# Patient Record
Sex: Male | Born: 1940 | Race: White | Hispanic: No | Marital: Married | Smoking: Former smoker
Health system: Southern US, Community
[De-identification: ages and names within clinical notes are randomized; demographics above are authoritative.]

## PROBLEM LIST (undated history)

## (undated) ENCOUNTER — Emergency Department (HOSPITAL_BASED_OUTPATIENT_CLINIC_OR_DEPARTMENT_OTHER): Admission: EM | Payer: Medicare Other

## (undated) DIAGNOSIS — M1990 Unspecified osteoarthritis, unspecified site: Secondary | ICD-10-CM

## (undated) DIAGNOSIS — N529 Male erectile dysfunction, unspecified: Secondary | ICD-10-CM

## (undated) DIAGNOSIS — I4891 Unspecified atrial fibrillation: Secondary | ICD-10-CM

## (undated) DIAGNOSIS — G4733 Obstructive sleep apnea (adult) (pediatric): Secondary | ICD-10-CM

## (undated) DIAGNOSIS — K219 Gastro-esophageal reflux disease without esophagitis: Secondary | ICD-10-CM

## (undated) DIAGNOSIS — N323 Diverticulum of bladder: Secondary | ICD-10-CM

## (undated) DIAGNOSIS — H5440 Blindness, one eye, unspecified eye: Secondary | ICD-10-CM

## (undated) DIAGNOSIS — N200 Calculus of kidney: Secondary | ICD-10-CM

## (undated) DIAGNOSIS — E669 Obesity, unspecified: Secondary | ICD-10-CM

## (undated) DIAGNOSIS — I2699 Other pulmonary embolism without acute cor pulmonale: Secondary | ICD-10-CM

## (undated) HISTORY — DX: Calculus of kidney: N20.0

## (undated) HISTORY — DX: Blindness, one eye, unspecified eye: H54.40

## (undated) HISTORY — DX: Unspecified osteoarthritis, unspecified site: M19.90

## (undated) HISTORY — DX: Diverticulum of bladder: N32.3

## (undated) HISTORY — DX: Male erectile dysfunction, unspecified: N52.9

## (undated) HISTORY — DX: Unspecified atrial fibrillation: I48.91

## (undated) HISTORY — DX: Other pulmonary embolism without acute cor pulmonale: I26.99

## (undated) HISTORY — DX: Gastro-esophageal reflux disease without esophagitis: K21.9

## (undated) HISTORY — DX: Obesity, unspecified: E66.9

---
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
## 1961-12-05 HISTORY — PX: APPENDECTOMY: SHX54

## 1982-12-05 HISTORY — PX: UMBILICAL HERNIA REPAIR: SHX196

## 1985-12-05 HISTORY — PX: LITHOTRIPSY: SUR834

## 1998-12-05 DIAGNOSIS — Z86711 Personal history of pulmonary embolism: Secondary | ICD-10-CM | POA: Insufficient documentation

## 2001-12-05 HISTORY — PX: NOSE SURGERY: SHX723

## 2003-12-06 DIAGNOSIS — H5440 Blindness, one eye, unspecified eye: Secondary | ICD-10-CM

## 2003-12-06 HISTORY — DX: Blindness, one eye, unspecified eye: H54.40

## 2013-06-17 DIAGNOSIS — L719 Rosacea, unspecified: Secondary | ICD-10-CM | POA: Insufficient documentation

## 2013-06-17 DIAGNOSIS — G4733 Obstructive sleep apnea (adult) (pediatric): Secondary | ICD-10-CM | POA: Insufficient documentation

## 2013-06-17 DIAGNOSIS — H40051 Ocular hypertension, right eye: Secondary | ICD-10-CM | POA: Insufficient documentation

## 2013-06-17 DIAGNOSIS — H40059 Ocular hypertension, unspecified eye: Secondary | ICD-10-CM | POA: Insufficient documentation

## 2013-06-17 DIAGNOSIS — I4891 Unspecified atrial fibrillation: Secondary | ICD-10-CM | POA: Insufficient documentation

## 2016-12-13 DIAGNOSIS — M461 Sacroiliitis, not elsewhere classified: Secondary | ICD-10-CM | POA: Diagnosis not present

## 2016-12-30 DIAGNOSIS — M461 Sacroiliitis, not elsewhere classified: Secondary | ICD-10-CM | POA: Diagnosis not present

## 2017-01-02 DIAGNOSIS — J449 Chronic obstructive pulmonary disease, unspecified: Secondary | ICD-10-CM | POA: Diagnosis not present

## 2017-01-02 DIAGNOSIS — I4891 Unspecified atrial fibrillation: Secondary | ICD-10-CM | POA: Diagnosis not present

## 2017-01-02 DIAGNOSIS — R030 Elevated blood-pressure reading, without diagnosis of hypertension: Secondary | ICD-10-CM | POA: Diagnosis not present

## 2017-01-04 DIAGNOSIS — H401111 Primary open-angle glaucoma, right eye, mild stage: Secondary | ICD-10-CM | POA: Diagnosis not present

## 2017-01-16 DIAGNOSIS — M47816 Spondylosis without myelopathy or radiculopathy, lumbar region: Secondary | ICD-10-CM | POA: Diagnosis not present

## 2017-01-17 DIAGNOSIS — M545 Low back pain: Secondary | ICD-10-CM | POA: Diagnosis not present

## 2017-01-17 DIAGNOSIS — Z79899 Other long term (current) drug therapy: Secondary | ICD-10-CM | POA: Diagnosis not present

## 2017-01-17 DIAGNOSIS — M6281 Muscle weakness (generalized): Secondary | ICD-10-CM | POA: Diagnosis not present

## 2017-01-17 DIAGNOSIS — Z9981 Dependence on supplemental oxygen: Secondary | ICD-10-CM | POA: Diagnosis not present

## 2017-01-17 DIAGNOSIS — M549 Dorsalgia, unspecified: Secondary | ICD-10-CM | POA: Diagnosis not present

## 2017-01-17 DIAGNOSIS — J439 Emphysema, unspecified: Secondary | ICD-10-CM | POA: Diagnosis not present

## 2017-01-18 DIAGNOSIS — Z1212 Encounter for screening for malignant neoplasm of rectum: Secondary | ICD-10-CM | POA: Diagnosis not present

## 2017-01-18 DIAGNOSIS — Z1211 Encounter for screening for malignant neoplasm of colon: Secondary | ICD-10-CM | POA: Diagnosis not present

## 2017-01-19 DIAGNOSIS — M549 Dorsalgia, unspecified: Secondary | ICD-10-CM | POA: Diagnosis not present

## 2017-01-19 DIAGNOSIS — M545 Low back pain: Secondary | ICD-10-CM | POA: Diagnosis not present

## 2017-01-19 DIAGNOSIS — Z79899 Other long term (current) drug therapy: Secondary | ICD-10-CM | POA: Diagnosis not present

## 2017-01-19 DIAGNOSIS — J439 Emphysema, unspecified: Secondary | ICD-10-CM | POA: Diagnosis not present

## 2017-01-19 DIAGNOSIS — M6281 Muscle weakness (generalized): Secondary | ICD-10-CM | POA: Diagnosis not present

## 2017-01-19 DIAGNOSIS — Z9981 Dependence on supplemental oxygen: Secondary | ICD-10-CM | POA: Diagnosis not present

## 2017-01-23 DIAGNOSIS — M545 Low back pain: Secondary | ICD-10-CM | POA: Diagnosis not present

## 2017-01-23 DIAGNOSIS — M549 Dorsalgia, unspecified: Secondary | ICD-10-CM | POA: Diagnosis not present

## 2017-01-23 DIAGNOSIS — J439 Emphysema, unspecified: Secondary | ICD-10-CM | POA: Diagnosis not present

## 2017-01-23 DIAGNOSIS — Z79899 Other long term (current) drug therapy: Secondary | ICD-10-CM | POA: Diagnosis not present

## 2017-01-23 DIAGNOSIS — Z9981 Dependence on supplemental oxygen: Secondary | ICD-10-CM | POA: Diagnosis not present

## 2017-01-23 DIAGNOSIS — M6281 Muscle weakness (generalized): Secondary | ICD-10-CM | POA: Diagnosis not present

## 2017-01-26 DIAGNOSIS — J439 Emphysema, unspecified: Secondary | ICD-10-CM | POA: Diagnosis not present

## 2017-01-26 DIAGNOSIS — M6281 Muscle weakness (generalized): Secondary | ICD-10-CM | POA: Diagnosis not present

## 2017-01-26 DIAGNOSIS — M545 Low back pain: Secondary | ICD-10-CM | POA: Diagnosis not present

## 2017-01-26 DIAGNOSIS — Z79899 Other long term (current) drug therapy: Secondary | ICD-10-CM | POA: Diagnosis not present

## 2017-01-26 DIAGNOSIS — Z9981 Dependence on supplemental oxygen: Secondary | ICD-10-CM | POA: Diagnosis not present

## 2017-01-26 DIAGNOSIS — M549 Dorsalgia, unspecified: Secondary | ICD-10-CM | POA: Diagnosis not present

## 2017-01-30 DIAGNOSIS — J439 Emphysema, unspecified: Secondary | ICD-10-CM | POA: Diagnosis not present

## 2017-01-30 DIAGNOSIS — M545 Low back pain: Secondary | ICD-10-CM | POA: Diagnosis not present

## 2017-01-30 DIAGNOSIS — Z79899 Other long term (current) drug therapy: Secondary | ICD-10-CM | POA: Diagnosis not present

## 2017-01-30 DIAGNOSIS — M549 Dorsalgia, unspecified: Secondary | ICD-10-CM | POA: Diagnosis not present

## 2017-01-30 DIAGNOSIS — M6281 Muscle weakness (generalized): Secondary | ICD-10-CM | POA: Diagnosis not present

## 2017-01-30 DIAGNOSIS — Z9981 Dependence on supplemental oxygen: Secondary | ICD-10-CM | POA: Diagnosis not present

## 2017-02-01 DIAGNOSIS — M47816 Spondylosis without myelopathy or radiculopathy, lumbar region: Secondary | ICD-10-CM | POA: Diagnosis not present

## 2017-02-02 DIAGNOSIS — M549 Dorsalgia, unspecified: Secondary | ICD-10-CM | POA: Diagnosis not present

## 2017-02-02 DIAGNOSIS — J439 Emphysema, unspecified: Secondary | ICD-10-CM | POA: Diagnosis not present

## 2017-02-02 DIAGNOSIS — M545 Low back pain: Secondary | ICD-10-CM | POA: Diagnosis not present

## 2017-02-02 DIAGNOSIS — H04123 Dry eye syndrome of bilateral lacrimal glands: Secondary | ICD-10-CM | POA: Diagnosis not present

## 2017-02-02 DIAGNOSIS — Z9981 Dependence on supplemental oxygen: Secondary | ICD-10-CM | POA: Diagnosis not present

## 2017-02-02 DIAGNOSIS — Z79899 Other long term (current) drug therapy: Secondary | ICD-10-CM | POA: Diagnosis not present

## 2017-02-02 DIAGNOSIS — M6281 Muscle weakness (generalized): Secondary | ICD-10-CM | POA: Diagnosis not present

## 2017-02-02 DIAGNOSIS — H00023 Hordeolum internum right eye, unspecified eyelid: Secondary | ICD-10-CM | POA: Diagnosis not present

## 2017-02-06 DIAGNOSIS — M545 Low back pain: Secondary | ICD-10-CM | POA: Diagnosis not present

## 2017-02-06 DIAGNOSIS — M6281 Muscle weakness (generalized): Secondary | ICD-10-CM | POA: Diagnosis not present

## 2017-02-06 DIAGNOSIS — G629 Polyneuropathy, unspecified: Secondary | ICD-10-CM | POA: Diagnosis not present

## 2017-02-27 DIAGNOSIS — M47816 Spondylosis without myelopathy or radiculopathy, lumbar region: Secondary | ICD-10-CM | POA: Diagnosis not present

## 2017-03-14 DIAGNOSIS — M47816 Spondylosis without myelopathy or radiculopathy, lumbar region: Secondary | ICD-10-CM | POA: Diagnosis not present

## 2017-03-27 DIAGNOSIS — I480 Paroxysmal atrial fibrillation: Secondary | ICD-10-CM | POA: Diagnosis not present

## 2017-03-27 DIAGNOSIS — I10 Essential (primary) hypertension: Secondary | ICD-10-CM | POA: Diagnosis not present

## 2017-03-27 DIAGNOSIS — J449 Chronic obstructive pulmonary disease, unspecified: Secondary | ICD-10-CM | POA: Diagnosis not present

## 2017-04-06 DIAGNOSIS — M47816 Spondylosis without myelopathy or radiculopathy, lumbar region: Secondary | ICD-10-CM | POA: Diagnosis not present

## 2017-04-13 DIAGNOSIS — I480 Paroxysmal atrial fibrillation: Secondary | ICD-10-CM | POA: Diagnosis not present

## 2017-04-13 DIAGNOSIS — I519 Heart disease, unspecified: Secondary | ICD-10-CM | POA: Diagnosis not present

## 2017-04-13 DIAGNOSIS — G4733 Obstructive sleep apnea (adult) (pediatric): Secondary | ICD-10-CM | POA: Diagnosis not present

## 2017-04-13 DIAGNOSIS — I10 Essential (primary) hypertension: Secondary | ICD-10-CM | POA: Diagnosis not present

## 2017-04-18 DIAGNOSIS — Z961 Presence of intraocular lens: Secondary | ICD-10-CM | POA: Diagnosis not present

## 2017-04-18 DIAGNOSIS — H401111 Primary open-angle glaucoma, right eye, mild stage: Secondary | ICD-10-CM | POA: Diagnosis not present

## 2017-04-18 DIAGNOSIS — H05402 Unspecified enophthalmos, left eye: Secondary | ICD-10-CM | POA: Diagnosis not present

## 2017-04-18 DIAGNOSIS — H00021 Hordeolum internum right upper eyelid: Secondary | ICD-10-CM | POA: Diagnosis not present

## 2017-04-18 DIAGNOSIS — H00022 Hordeolum internum right lower eyelid: Secondary | ICD-10-CM | POA: Diagnosis not present

## 2017-05-02 DIAGNOSIS — H40051 Ocular hypertension, right eye: Secondary | ICD-10-CM | POA: Diagnosis not present

## 2017-05-02 DIAGNOSIS — H018 Other specified inflammations of eyelid: Secondary | ICD-10-CM | POA: Diagnosis not present

## 2017-05-02 DIAGNOSIS — H10013 Acute follicular conjunctivitis, bilateral: Secondary | ICD-10-CM | POA: Diagnosis not present

## 2017-05-02 DIAGNOSIS — H04123 Dry eye syndrome of bilateral lacrimal glands: Secondary | ICD-10-CM | POA: Diagnosis not present

## 2017-05-08 DIAGNOSIS — M48061 Spinal stenosis, lumbar region without neurogenic claudication: Secondary | ICD-10-CM | POA: Diagnosis not present

## 2017-05-08 DIAGNOSIS — J410 Simple chronic bronchitis: Secondary | ICD-10-CM | POA: Diagnosis not present

## 2017-05-08 DIAGNOSIS — M48062 Spinal stenosis, lumbar region with neurogenic claudication: Secondary | ICD-10-CM | POA: Diagnosis not present

## 2017-05-08 DIAGNOSIS — G4733 Obstructive sleep apnea (adult) (pediatric): Secondary | ICD-10-CM | POA: Diagnosis not present

## 2017-05-08 DIAGNOSIS — J9610 Chronic respiratory failure, unspecified whether with hypoxia or hypercapnia: Secondary | ICD-10-CM | POA: Diagnosis not present

## 2017-05-16 DIAGNOSIS — H40051 Ocular hypertension, right eye: Secondary | ICD-10-CM | POA: Diagnosis not present

## 2017-05-16 DIAGNOSIS — H10431 Chronic follicular conjunctivitis, right eye: Secondary | ICD-10-CM | POA: Diagnosis not present

## 2017-05-26 DIAGNOSIS — M48062 Spinal stenosis, lumbar region with neurogenic claudication: Secondary | ICD-10-CM | POA: Diagnosis not present

## 2017-06-16 DIAGNOSIS — H10431 Chronic follicular conjunctivitis, right eye: Secondary | ICD-10-CM | POA: Diagnosis not present

## 2017-06-16 DIAGNOSIS — H40051 Ocular hypertension, right eye: Secondary | ICD-10-CM | POA: Diagnosis not present

## 2017-07-10 DIAGNOSIS — H10431 Chronic follicular conjunctivitis, right eye: Secondary | ICD-10-CM | POA: Diagnosis not present

## 2017-07-10 DIAGNOSIS — H40051 Ocular hypertension, right eye: Secondary | ICD-10-CM | POA: Diagnosis not present

## 2017-07-19 DIAGNOSIS — H40051 Ocular hypertension, right eye: Secondary | ICD-10-CM | POA: Diagnosis not present

## 2017-08-21 DIAGNOSIS — H40051 Ocular hypertension, right eye: Secondary | ICD-10-CM | POA: Diagnosis not present

## 2017-08-31 DIAGNOSIS — Z87891 Personal history of nicotine dependence: Secondary | ICD-10-CM | POA: Diagnosis not present

## 2017-08-31 DIAGNOSIS — Z7901 Long term (current) use of anticoagulants: Secondary | ICD-10-CM | POA: Diagnosis not present

## 2017-08-31 DIAGNOSIS — Z7951 Long term (current) use of inhaled steroids: Secondary | ICD-10-CM | POA: Diagnosis not present

## 2017-08-31 DIAGNOSIS — M47816 Spondylosis without myelopathy or radiculopathy, lumbar region: Secondary | ICD-10-CM | POA: Diagnosis not present

## 2017-08-31 DIAGNOSIS — G894 Chronic pain syndrome: Secondary | ICD-10-CM | POA: Diagnosis not present

## 2017-08-31 DIAGNOSIS — I10 Essential (primary) hypertension: Secondary | ICD-10-CM | POA: Diagnosis not present

## 2017-08-31 DIAGNOSIS — I4891 Unspecified atrial fibrillation: Secondary | ICD-10-CM | POA: Diagnosis not present

## 2017-08-31 DIAGNOSIS — Z86711 Personal history of pulmonary embolism: Secondary | ICD-10-CM | POA: Diagnosis not present

## 2017-08-31 DIAGNOSIS — M545 Low back pain: Secondary | ICD-10-CM | POA: Diagnosis not present

## 2017-08-31 DIAGNOSIS — Z79899 Other long term (current) drug therapy: Secondary | ICD-10-CM | POA: Diagnosis not present

## 2017-09-13 DIAGNOSIS — J0100 Acute maxillary sinusitis, unspecified: Secondary | ICD-10-CM | POA: Diagnosis not present

## 2017-09-13 DIAGNOSIS — L719 Rosacea, unspecified: Secondary | ICD-10-CM | POA: Diagnosis not present

## 2017-09-13 DIAGNOSIS — G4730 Sleep apnea, unspecified: Secondary | ICD-10-CM | POA: Diagnosis not present

## 2017-09-13 DIAGNOSIS — J439 Emphysema, unspecified: Secondary | ICD-10-CM | POA: Diagnosis not present

## 2017-09-13 DIAGNOSIS — I10 Essential (primary) hypertension: Secondary | ICD-10-CM | POA: Diagnosis not present

## 2017-09-13 DIAGNOSIS — E669 Obesity, unspecified: Secondary | ICD-10-CM | POA: Diagnosis not present

## 2017-09-13 DIAGNOSIS — R5383 Other fatigue: Secondary | ICD-10-CM | POA: Diagnosis not present

## 2017-09-13 DIAGNOSIS — I4891 Unspecified atrial fibrillation: Secondary | ICD-10-CM | POA: Diagnosis not present

## 2017-09-13 DIAGNOSIS — G894 Chronic pain syndrome: Secondary | ICD-10-CM | POA: Diagnosis not present

## 2017-09-13 DIAGNOSIS — Z7689 Persons encountering health services in other specified circumstances: Secondary | ICD-10-CM | POA: Diagnosis not present

## 2017-09-13 DIAGNOSIS — L989 Disorder of the skin and subcutaneous tissue, unspecified: Secondary | ICD-10-CM | POA: Diagnosis not present

## 2017-09-13 DIAGNOSIS — Z125 Encounter for screening for malignant neoplasm of prostate: Secondary | ICD-10-CM | POA: Diagnosis not present

## 2017-09-21 DIAGNOSIS — G894 Chronic pain syndrome: Secondary | ICD-10-CM | POA: Diagnosis not present

## 2017-09-21 DIAGNOSIS — M47816 Spondylosis without myelopathy or radiculopathy, lumbar region: Secondary | ICD-10-CM | POA: Diagnosis not present

## 2017-09-22 DIAGNOSIS — I10 Essential (primary) hypertension: Secondary | ICD-10-CM | POA: Diagnosis not present

## 2017-09-22 DIAGNOSIS — R5383 Other fatigue: Secondary | ICD-10-CM | POA: Diagnosis not present

## 2017-09-22 DIAGNOSIS — Z125 Encounter for screening for malignant neoplasm of prostate: Secondary | ICD-10-CM | POA: Diagnosis not present

## 2017-09-22 DIAGNOSIS — E669 Obesity, unspecified: Secondary | ICD-10-CM | POA: Diagnosis not present

## 2017-09-28 DIAGNOSIS — Z23 Encounter for immunization: Secondary | ICD-10-CM | POA: Diagnosis not present

## 2017-10-11 DIAGNOSIS — H01009 Unspecified blepharitis unspecified eye, unspecified eyelid: Secondary | ICD-10-CM | POA: Diagnosis not present

## 2017-10-11 DIAGNOSIS — H02125 Mechanical ectropion of left lower eyelid: Secondary | ICD-10-CM | POA: Diagnosis not present

## 2017-10-11 DIAGNOSIS — H40051 Ocular hypertension, right eye: Secondary | ICD-10-CM | POA: Diagnosis not present

## 2017-10-11 DIAGNOSIS — Z961 Presence of intraocular lens: Secondary | ICD-10-CM | POA: Diagnosis not present

## 2017-10-24 DIAGNOSIS — G894 Chronic pain syndrome: Secondary | ICD-10-CM | POA: Diagnosis not present

## 2017-10-24 DIAGNOSIS — M47816 Spondylosis without myelopathy or radiculopathy, lumbar region: Secondary | ICD-10-CM | POA: Diagnosis not present

## 2017-11-20 ENCOUNTER — Encounter: Payer: Self-pay | Admitting: Pulmonary Disease

## 2017-11-20 DIAGNOSIS — J42 Unspecified chronic bronchitis: Secondary | ICD-10-CM | POA: Diagnosis not present

## 2017-11-20 DIAGNOSIS — G4733 Obstructive sleep apnea (adult) (pediatric): Secondary | ICD-10-CM | POA: Diagnosis not present

## 2017-11-20 DIAGNOSIS — J61 Pneumoconiosis due to asbestos and other mineral fibers: Secondary | ICD-10-CM | POA: Diagnosis not present

## 2017-11-20 DIAGNOSIS — J920 Pleural plaque with presence of asbestos: Secondary | ICD-10-CM | POA: Diagnosis not present

## 2017-12-06 DIAGNOSIS — L400 Psoriasis vulgaris: Secondary | ICD-10-CM | POA: Diagnosis not present

## 2017-12-06 DIAGNOSIS — Z79899 Other long term (current) drug therapy: Secondary | ICD-10-CM | POA: Diagnosis not present

## 2017-12-06 DIAGNOSIS — Z77090 Contact with and (suspected) exposure to asbestos: Secondary | ICD-10-CM | POA: Diagnosis not present

## 2017-12-06 DIAGNOSIS — L814 Other melanin hyperpigmentation: Secondary | ICD-10-CM | POA: Diagnosis not present

## 2017-12-06 DIAGNOSIS — Z572 Occupational exposure to dust: Secondary | ICD-10-CM | POA: Diagnosis not present

## 2017-12-06 DIAGNOSIS — Z86711 Personal history of pulmonary embolism: Secondary | ICD-10-CM | POA: Diagnosis not present

## 2017-12-06 DIAGNOSIS — J61 Pneumoconiosis due to asbestos and other mineral fibers: Secondary | ICD-10-CM | POA: Diagnosis not present

## 2017-12-06 DIAGNOSIS — Z5181 Encounter for therapeutic drug level monitoring: Secondary | ICD-10-CM | POA: Diagnosis not present

## 2017-12-06 DIAGNOSIS — J920 Pleural plaque with presence of asbestos: Secondary | ICD-10-CM | POA: Diagnosis not present

## 2017-12-06 DIAGNOSIS — E669 Obesity, unspecified: Secondary | ICD-10-CM | POA: Diagnosis not present

## 2017-12-06 DIAGNOSIS — R0600 Dyspnea, unspecified: Secondary | ICD-10-CM | POA: Diagnosis not present

## 2017-12-06 DIAGNOSIS — J42 Unspecified chronic bronchitis: Secondary | ICD-10-CM | POA: Diagnosis not present

## 2017-12-06 DIAGNOSIS — Z7901 Long term (current) use of anticoagulants: Secondary | ICD-10-CM | POA: Diagnosis not present

## 2017-12-06 DIAGNOSIS — Z87891 Personal history of nicotine dependence: Secondary | ICD-10-CM | POA: Diagnosis not present

## 2017-12-06 DIAGNOSIS — G4733 Obstructive sleep apnea (adult) (pediatric): Secondary | ICD-10-CM | POA: Diagnosis not present

## 2017-12-13 DIAGNOSIS — G4700 Insomnia, unspecified: Secondary | ICD-10-CM | POA: Diagnosis not present

## 2017-12-13 DIAGNOSIS — N400 Enlarged prostate without lower urinary tract symptoms: Secondary | ICD-10-CM | POA: Diagnosis not present

## 2017-12-13 DIAGNOSIS — I10 Essential (primary) hypertension: Secondary | ICD-10-CM | POA: Diagnosis not present

## 2017-12-13 DIAGNOSIS — E669 Obesity, unspecified: Secondary | ICD-10-CM | POA: Diagnosis not present

## 2017-12-13 DIAGNOSIS — I4891 Unspecified atrial fibrillation: Secondary | ICD-10-CM | POA: Diagnosis not present

## 2017-12-13 DIAGNOSIS — R7303 Prediabetes: Secondary | ICD-10-CM | POA: Diagnosis not present

## 2017-12-22 DIAGNOSIS — I10 Essential (primary) hypertension: Secondary | ICD-10-CM | POA: Diagnosis not present

## 2017-12-22 DIAGNOSIS — I4891 Unspecified atrial fibrillation: Secondary | ICD-10-CM | POA: Diagnosis not present

## 2018-01-08 DIAGNOSIS — G894 Chronic pain syndrome: Secondary | ICD-10-CM | POA: Diagnosis not present

## 2018-01-08 DIAGNOSIS — M47816 Spondylosis without myelopathy or radiculopathy, lumbar region: Secondary | ICD-10-CM | POA: Diagnosis not present

## 2018-01-08 DIAGNOSIS — Z79899 Other long term (current) drug therapy: Secondary | ICD-10-CM | POA: Diagnosis not present

## 2018-01-23 ENCOUNTER — Encounter: Payer: Self-pay | Admitting: Pulmonary Disease

## 2018-01-23 DIAGNOSIS — J920 Pleural plaque with presence of asbestos: Secondary | ICD-10-CM | POA: Diagnosis not present

## 2018-01-23 DIAGNOSIS — J42 Unspecified chronic bronchitis: Secondary | ICD-10-CM | POA: Diagnosis not present

## 2018-01-23 DIAGNOSIS — J61 Pneumoconiosis due to asbestos and other mineral fibers: Secondary | ICD-10-CM | POA: Diagnosis not present

## 2018-01-23 DIAGNOSIS — G4733 Obstructive sleep apnea (adult) (pediatric): Secondary | ICD-10-CM | POA: Diagnosis not present

## 2018-01-30 DIAGNOSIS — J920 Pleural plaque with presence of asbestos: Secondary | ICD-10-CM | POA: Diagnosis not present

## 2018-01-30 DIAGNOSIS — J61 Pneumoconiosis due to asbestos and other mineral fibers: Secondary | ICD-10-CM | POA: Diagnosis not present

## 2018-01-30 DIAGNOSIS — G4733 Obstructive sleep apnea (adult) (pediatric): Secondary | ICD-10-CM | POA: Diagnosis not present

## 2018-01-30 DIAGNOSIS — J42 Unspecified chronic bronchitis: Secondary | ICD-10-CM | POA: Diagnosis not present

## 2018-02-20 DIAGNOSIS — H40051 Ocular hypertension, right eye: Secondary | ICD-10-CM | POA: Diagnosis not present

## 2018-02-20 DIAGNOSIS — H04123 Dry eye syndrome of bilateral lacrimal glands: Secondary | ICD-10-CM | POA: Diagnosis not present

## 2018-02-22 DIAGNOSIS — M47816 Spondylosis without myelopathy or radiculopathy, lumbar region: Secondary | ICD-10-CM | POA: Diagnosis not present

## 2018-02-22 DIAGNOSIS — G894 Chronic pain syndrome: Secondary | ICD-10-CM | POA: Diagnosis not present

## 2018-02-28 DIAGNOSIS — I4891 Unspecified atrial fibrillation: Secondary | ICD-10-CM | POA: Diagnosis not present

## 2018-02-28 DIAGNOSIS — E669 Obesity, unspecified: Secondary | ICD-10-CM | POA: Diagnosis not present

## 2018-02-28 DIAGNOSIS — G4700 Insomnia, unspecified: Secondary | ICD-10-CM | POA: Diagnosis not present

## 2018-02-28 DIAGNOSIS — R7303 Prediabetes: Secondary | ICD-10-CM | POA: Diagnosis not present

## 2018-02-28 DIAGNOSIS — I10 Essential (primary) hypertension: Secondary | ICD-10-CM | POA: Diagnosis not present

## 2018-04-10 DIAGNOSIS — J0100 Acute maxillary sinusitis, unspecified: Secondary | ICD-10-CM | POA: Diagnosis not present

## 2018-04-27 DIAGNOSIS — H5201 Hypermetropia, right eye: Secondary | ICD-10-CM | POA: Diagnosis not present

## 2018-04-27 DIAGNOSIS — H52221 Regular astigmatism, right eye: Secondary | ICD-10-CM | POA: Diagnosis not present

## 2018-04-27 DIAGNOSIS — H40051 Ocular hypertension, right eye: Secondary | ICD-10-CM | POA: Diagnosis not present

## 2018-04-27 DIAGNOSIS — H524 Presbyopia: Secondary | ICD-10-CM | POA: Diagnosis not present

## 2018-05-15 ENCOUNTER — Encounter: Payer: Self-pay | Admitting: *Deleted

## 2018-05-17 ENCOUNTER — Ambulatory Visit: Payer: Self-pay | Admitting: Physician Assistant

## 2018-05-17 ENCOUNTER — Encounter: Payer: Self-pay | Admitting: Physician Assistant

## 2018-05-18 ENCOUNTER — Telehealth: Payer: Self-pay | Admitting: General Practice

## 2018-05-18 NOTE — Telephone Encounter (Signed)
Error

## 2018-05-18 NOTE — Telephone Encounter (Signed)
Patient is scheduled   

## 2018-05-18 NOTE — Telephone Encounter (Signed)
The patient needs to be scheduled for a new patient appointment on Tuesday June 18 at 11:00am with Jarold MottoSamantha Worley. [facility] The patient has been advised that Samantha cannot prescribe chronic pain medications. The patient verbalized understanding stating that this is okay because he goes to a pain clinic.

## 2018-05-22 ENCOUNTER — Ambulatory Visit (INDEPENDENT_AMBULATORY_CARE_PROVIDER_SITE_OTHER): Payer: Medicare Other | Admitting: Physician Assistant

## 2018-05-22 ENCOUNTER — Encounter: Payer: Self-pay | Admitting: Physician Assistant

## 2018-05-22 VITALS — BP 120/74 | HR 86 | Temp 97.9°F | Ht 69.5 in | Wt 269.2 lb

## 2018-05-22 DIAGNOSIS — Z87891 Personal history of nicotine dependence: Secondary | ICD-10-CM | POA: Insufficient documentation

## 2018-05-22 DIAGNOSIS — Z7689 Persons encountering health services in other specified circumstances: Secondary | ICD-10-CM

## 2018-05-22 DIAGNOSIS — I2699 Other pulmonary embolism without acute cor pulmonale: Secondary | ICD-10-CM | POA: Diagnosis not present

## 2018-05-22 DIAGNOSIS — G4700 Insomnia, unspecified: Secondary | ICD-10-CM | POA: Diagnosis not present

## 2018-05-22 DIAGNOSIS — K219 Gastro-esophageal reflux disease without esophagitis: Secondary | ICD-10-CM | POA: Insufficient documentation

## 2018-05-22 DIAGNOSIS — G4733 Obstructive sleep apnea (adult) (pediatric): Secondary | ICD-10-CM

## 2018-05-22 DIAGNOSIS — N200 Calculus of kidney: Secondary | ICD-10-CM | POA: Insufficient documentation

## 2018-05-22 DIAGNOSIS — J849 Interstitial pulmonary disease, unspecified: Secondary | ICD-10-CM | POA: Insufficient documentation

## 2018-05-22 DIAGNOSIS — H5440 Blindness, one eye, unspecified eye: Secondary | ICD-10-CM

## 2018-05-22 DIAGNOSIS — I10 Essential (primary) hypertension: Secondary | ICD-10-CM

## 2018-05-22 DIAGNOSIS — H409 Unspecified glaucoma: Secondary | ICD-10-CM | POA: Diagnosis not present

## 2018-05-22 DIAGNOSIS — M1990 Unspecified osteoarthritis, unspecified site: Secondary | ICD-10-CM

## 2018-05-22 DIAGNOSIS — I4891 Unspecified atrial fibrillation: Secondary | ICD-10-CM

## 2018-05-22 DIAGNOSIS — N529 Male erectile dysfunction, unspecified: Secondary | ICD-10-CM | POA: Diagnosis not present

## 2018-05-22 DIAGNOSIS — J439 Emphysema, unspecified: Secondary | ICD-10-CM

## 2018-05-22 DIAGNOSIS — E669 Obesity, unspecified: Secondary | ICD-10-CM | POA: Insufficient documentation

## 2018-05-22 DIAGNOSIS — G894 Chronic pain syndrome: Secondary | ICD-10-CM

## 2018-05-22 LAB — COMPREHENSIVE METABOLIC PANEL
ALBUMIN: 3.9 g/dL (ref 3.5–5.2)
ALK PHOS: 75 U/L (ref 39–117)
ALT: 19 U/L (ref 0–53)
AST: 24 U/L (ref 0–37)
BUN: 16 mg/dL (ref 6–23)
CHLORIDE: 100 meq/L (ref 96–112)
CO2: 34 mEq/L — ABNORMAL HIGH (ref 19–32)
CREATININE: 0.85 mg/dL (ref 0.40–1.50)
Calcium: 9.8 mg/dL (ref 8.4–10.5)
GFR: 92.83 mL/min (ref >60.00)
GLUCOSE: 98 mg/dL (ref 70–99)
Potassium: 4.6 mEq/L (ref 3.5–5.1)
SODIUM: 140 meq/L (ref 135–145)
TOTAL PROTEIN: 6.8 g/dL (ref 6.0–8.3)
Total Bilirubin: 0.6 mg/dL (ref 0.2–1.2)

## 2018-05-22 MED ORDER — ATORVASTATIN CALCIUM 40 MG PO TABS: 40.0000 mg | ORAL_TABLET | Freq: Every day | ORAL | 0 refills | Status: DC

## 2018-05-22 MED ORDER — TADALAFIL 10 MG PO TABS: 10.0000 mg | ORAL_TABLET | Freq: Every day | ORAL | 0 refills | Status: DC | PRN

## 2018-05-22 NOTE — Assessment & Plan Note (Signed)
Management per [facility]. 

## 2018-05-22 NOTE — Patient Instructions (Addendum)
It was great to meet you.  Start Lipitor 40 mg. This is for your cholesterol.  If a referral was placed today, you will be contacted for an appointment. Please note that routine referrals can sometimes take up to 3-4 weeks to process. Please call our office if you haven't heard anything after this time frame.  Let's follow-up in 3-6 months.

## 2018-05-22 NOTE — Assessment & Plan Note (Signed)
Needs to establish with pulmonary here. Will refer. Continue oxygen and previously prescribed.

## 2018-05-22 NOTE — Progress Notes (Signed)
Maurice Reed is a 77 y.o. male here to Establish Care.  I acted as a Neurosurgeon for [employer], PA-C Maurice Mull, LPN  History of Present Illness:   Chief Complaint  Patient presents with  . Establish Care  . Referral for Pain Management    Cardiologist, Pulmonary    Chronic Issues: HTN -- Currently taking Toprol XL 25 mg daily, Torsemide 20 mg BID. Also takes potassium chloride 10 mEq BID. At home blood pressure readings are: 116/79. Patient denies chest pain, SOB, blurred vision, dizziness, unusual headaches, lower leg swelling. Patient is compliant with medication. Denies excessive caffeine intake, stimulant usage, excessive alcohol intake, or increase in salt consumption. A fib and PE -- reports that he takes Eliquis 5 mg BID. 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 Has been on oxygen since 2002 . Used to wear all day long, but when he found a new pulmonologist he was told that he didn't need to wear it all the time anymore. Was told to use it only when he is active or becomes SOB, also uses at night. OSA -- uses BiPAP. 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 Takes oxycodone and tylenol at bedtime and then he wakes up at 4am and will takes his second oxycodone at that time.  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  Other providers/specialists: Cardiologist -- [facility] at [facility] Pulmonologist -- [facility] in [facility] -- [facility] -- [facility] Pain -- [facility] -- Pain Management [facility]  Past Medical History:  Diagnosis Date  . Arthritis   . Atrial fibrillation (HCC)   . 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 GERD (gastroesophageal reflux disease)   . Kidney stones   . Obesity      Social History   Socioeconomic History  . Marital status: Married    Spouse name: Not on file  . Number of children: Not on file  . Years of education: Not on file  . Highest education level: Not on file  Occupational History  . Not on file  Social Needs  . Financial resource strain: Not on file  . Food insecurity:    Worry: Not on file    Inability: Not on file  . Transportation needs:    Medical: Not on file    Non-medical: Not on file  Tobacco Use  . Smoking status: Former Smoker    Packs/day: 3.00    Years: 25.00    Pack years: 75.00    Types: Cigarettes    Last attempt to quit: 11/28/1984    Years since quitting: 33.5  . Smokeless tobacco: Never Used  Substance and Sexual Activity  . Alcohol use: Never    Frequency: Never  . Drug use: Never  .  Sexual activity: Yes  Lifestyle  . Physical activity:    Days per week: Not on file    Minutes per session: Not on file  . Stress: Not on file  Relationships  . Social connections:    Talks on phone: Not on file    Gets together: Not on file    Attends religious service: Not on file    Active member of club or organization: Not on file    Attends meetings of clubs or organizations: Not on file    Relationship status: Not on file  . Intimate partner violence:    Fear of current or ex partner: Not on file    Emotionally abused: Not on file    Physically abused: Not on file    Forced sexual activity: Not on file  Other Topics Concern  . 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 APPENDECTOMY  1963  . LITHOTRIPSY  1987  . NOSE SURGERY  2003  . TONSILLECTOMY AND ADENOIDECTOMY  1948  . UMBILICAL HERNIA REPAIR  1984    History reviewed. No pertinent family history.  Allergies  Allergen Reactions  . Other Itching    Cats: watery eyes, itching, sneezing     Current Medications:   Current Outpatient Medications:  .  albuterol (PROVENTIL HFA) 108 (90 Base) MCG/ACT inhaler, Inhale into the lungs., Disp: , Rfl:  .  Atropine Sulfate-NaCl 0.01-0.9 % SOLN, Apply to eye., Disp: , Rfl:  .  Cholecalciferol (VITAMIN D3) 3000 units TABS, Take by mouth., Disp: , Rfl:  .  clobetasol (TEMOVATE) 0.05 % external solution, APPLY TO AFFECTED AREAS ON SCALP ONE TO TWO TIMES D UTD, Disp: , Rfl: 11 .  docusate sodium (COLACE) 100 MG capsule, Take by mouth., Disp: , Rfl:  .  doxycycline (VIBRA-TABS) 100 MG tablet, TK 1 T PO D, Disp: , Rfl: 1 .  ELIQUIS 5 MG TABS tablet, TK 1 T PO BID, Disp: , Rfl: 0 .  erythromycin ophthalmic ointment, APP THIN LAYER IN OS BID, Disp: , Rfl: 1 .  fluticasone (FLONASE) 50 MCG/ACT nasal spray, Place into the nose., Disp: , Rfl:  .  loratadine (CLARITIN) 10 MG tablet, Take 10 mg by mouth daily., Disp: , Rfl: 0 .  Magnesium 400 MG TABS, Take by mouth., Disp: , Rfl:  .  MAGNESIUM-OXIDE 400 (241.3 Mg) MG tablet, TK 1 T PO BID, Disp: , Rfl: 1 .  metoprolol succinate (TOPROL-XL) 25 MG 24 hr tablet, TK 1 T PO D, Disp: , Rfl: 1 .  Naloxone HCl (EVZIO) 2 MG/0.4ML SOAJ, by Injection route as directed., Disp: , Rfl:  .  oxyCODONE (OXY IR/ROXICODONE) 5 MG immediate release tablet, Take 5 mg by mouth 2 (two) times daily as needed. for pain, Disp: , Rfl: 0 .  potassium chloride (MICRO-K) 10 MEQ  CR capsule, TK 1 C PO BID, Disp: , Rfl: 1 .  prednisoLONE acetate (PRED FORTE) 1 % ophthalmic suspension, SHAKE LQ AND INT 1 GTT IN OS QD PRN, Disp: , Rfl: 1 .  Tafluprost (ZIOPTAN) 0.0015 % SOLN, Apply  to eye., Disp: , Rfl:  .  tamsulosin (FLOMAX) 0.4 MG CAPS capsule, TK 1 C PO HS, Disp: , Rfl: 1 .  temazepam (RESTORIL) 15 MG capsule, TK ONE C PO QHS PRF SLEEP, Disp: , Rfl: 1 .  torsemide (DEMADEX) 20 MG tablet, TK 1 T PO BID, Disp: , Rfl: 0 .  atorvastatin (LIPITOR) 40 MG tablet, Take 1 tablet (40 mg total) by mouth daily., Disp: 90 tablet, Rfl: 0 .  tadalafil (CIALIS) 10 MG tablet, Take 1 tablet (10 mg total) by mouth daily as needed for erectile dysfunction., Disp: 10 tablet, Rfl: 0   Review of Systems:   ROS  Negative unless otherwise specified per HPI.  Vitals:   Vitals:   05/22/18 1113  BP: 120/74  Pulse: 86  Temp: 97.9 F (36.6 C)  TempSrc: Oral  SpO2: 97%  Weight: 269 lb 4 oz (122.1 kg)  Height: 5' 9.5" (1.765 m)     Body mass index is 39.19 kg/m.  Physical Exam:   Physical Exam  Constitutional: He appears well-developed. He is cooperative.  Non-toxic appearance. He does not have a sickly appearance. He does not appear ill. No distress.  Cardiovascular: Normal rate, S1 normal, S2 normal, normal heart sounds and normal pulses. An irregularly irregular rhythm present.  No LE edema  Pulmonary/Chest: Effort normal and breath sounds normal.  Neurological: He is alert. GCS eye subscore is 4. GCS verbal subscore is 5. GCS motor subscore is 6.  Skin: Skin is warm, dry and intact.  Psychiatric: He has a normal mood and affect. His speech is normal and behavior is normal.  Nursing note and vitals reviewed.   Assessment and Plan:    Problem List Items Addressed This Visit      Cardiovascular and Mediastinum   Pulmonary embolism (HCC)    Continue Eliquis 5 mg BID.      Relevant Medications   atorvastatin (LIPITOR) 40 MG tablet   tadalafil (CIALIS) 10 MG tablet    Essential hypertension    Continue current regimen. Needs cardiology here, will refer. I have no records today of prior echo's. ASCVD from March 27th was 32.4%. Patient reports that he has never been on or offered cholesterol medication. Given ASCVD, will start Lipitor 40 mg today, patient is agreeable.      Relevant Medications   atorvastatin (LIPITOR) 40 MG tablet   tadalafil (CIALIS) 10 MG tablet   Other Relevant Orders   Comprehensive metabolic panel   Ambulatory referral to Cardiology   Atrial fibrillation (HCC)    Continue Eliquis 5 mg BID and metoprolol 25 mg daily. Needs cardiology here, will refer. I have no records today of prior echo's. ASCVD from March 27th was 32.4%. Patient reports that he has never been on or offered cholesterol medication. Given ASCVD, will start Lipitor 40 mg today, patient is agreeable.      Relevant Medications   atorvastatin (LIPITOR) 40 MG tablet   tadalafil (CIALIS) 10 MG tablet   Other Relevant Orders   Ambulatory referral to Cardiology     Respiratory   Sleep apnea    Continue BiPAP. Will need new pulmonologist here, will refer.      Pulmonary emphysema (HCC)    Needs to establish with pulmonary here. Will refer. Continue oxygen and previously prescribed.       Relevant Medications   albuterol (PROVENTIL HFA) 108 (90 Base) MCG/ACT inhaler   fluticasone (FLONASE) 50 MCG/ACT nasal spray   Other Relevant Orders  Ambulatory referral to Pulmonology     Musculoskeletal and Integument   Arthritis    Defer to pain management.        Genitourinary   Erectile dysfunction    Will refill Cialis 10 mg today.        Other   Borderline glaucoma with ocular hypertension    [facility].      Relevant Medications   Atropine Sulfate-NaCl 0.01-0.9 % SOLN   Tafluprost (ZIOPTAN) 0.0015 % SOLN   Former smoker    Congratulated on continued cessation.      Relevant Orders   Ambulatory referral to Pulmonology   Blind left eye     [facility].      Insomnia    On Restoril.       Other Visit Diagnoses    Encounter to establish care    -  Primary      . Reviewed expectations re: course of current medical issues. . Discussed self-management of symptoms. . Outlined signs and symptoms indicating need for more acute intervention. . Patient verbalized understanding and all questions were answered. . See orders for this visit as documented in the electronic medical record. . Patient received an After-Visit Summary.   I spent 45 minutes with this patient, greater than 50% was face-to-face time counseling regarding the above diagnoses.  CMA or LPN served as scribe during this visit. History, Physical, and Plan performed by medical provider. Documentation and orders reviewed and attested to.  Jarold MottoSamantha Penny Frisbie, PA-C

## 2018-05-22 NOTE — Assessment & Plan Note (Signed)
Continue Eliquis 5 mg BID and metoprolol 25 mg daily. Needs cardiology here, will refer. I have no records today of prior echo's. ASCVD from March 27th was 32.4%. Patient reports that he has never been on or offered cholesterol medication. Given ASCVD, will start Lipitor 40 mg today, patient is agreeable.

## 2018-05-22 NOTE — Assessment & Plan Note (Signed)
Defer to pain management. 

## 2018-05-22 NOTE — Assessment & Plan Note (Signed)
Continue Eliquis 5mg BID 

## 2018-05-22 NOTE — Assessment & Plan Note (Signed)
Management per [facility].

## 2018-05-22 NOTE — Assessment & Plan Note (Signed)
Continue BiPAP. Will need new pulmonologist here, will refer.

## 2018-05-22 NOTE — Assessment & Plan Note (Signed)
Congratulated on continued cessation.  

## 2018-05-22 NOTE — Assessment & Plan Note (Signed)
Will refill Cialis 10 mg today.

## 2018-05-22 NOTE — Assessment & Plan Note (Signed)
On Restoril.

## 2018-05-22 NOTE — Assessment & Plan Note (Signed)
Continue current regimen. Needs cardiology here, will refer. I have no records today of prior echo's. ASCVD from March 27th was 32.4%. Patient reports that he has never been on or offered cholesterol medication. Given ASCVD, will start Lipitor 40 mg today, patient is agreeable.

## 2018-05-23 DIAGNOSIS — G894 Chronic pain syndrome: Secondary | ICD-10-CM | POA: Diagnosis not present

## 2018-05-23 DIAGNOSIS — M47816 Spondylosis without myelopathy or radiculopathy, lumbar region: Secondary | ICD-10-CM | POA: Diagnosis not present

## 2018-05-24 ENCOUNTER — Telehealth: Payer: Self-pay | Admitting: Physician Assistant

## 2018-05-24 NOTE — Telephone Encounter (Signed)
Lab result notes entered by Jarold MottoSamantha Worley on 05-22-2018 read to patient

## 2018-05-29 ENCOUNTER — Encounter: Payer: Self-pay | Admitting: Pulmonary Disease

## 2018-05-29 ENCOUNTER — Ambulatory Visit (INDEPENDENT_AMBULATORY_CARE_PROVIDER_SITE_OTHER): Payer: Medicare Other | Admitting: Pulmonary Disease

## 2018-05-29 ENCOUNTER — Ambulatory Visit (INDEPENDENT_AMBULATORY_CARE_PROVIDER_SITE_OTHER)
Admission: RE | Admit: 2018-05-29 | Discharge: 2018-05-29 | Disposition: A | Discharge status: Home / Self Care | Payer: Medicare Other | Source: Ambulatory Visit | Attending: Pulmonary Disease | Admitting: Pulmonary Disease

## 2018-05-29 VITALS — BP 110/82 | HR 95 | Ht 69.5 in | Wt 265.8 lb

## 2018-05-29 DIAGNOSIS — G4733 Obstructive sleep apnea (adult) (pediatric): Secondary | ICD-10-CM

## 2018-05-29 DIAGNOSIS — J439 Emphysema, unspecified: Secondary | ICD-10-CM | POA: Diagnosis not present

## 2018-05-29 DIAGNOSIS — I4891 Unspecified atrial fibrillation: Secondary | ICD-10-CM | POA: Diagnosis not present

## 2018-05-29 IMAGING — DX DG CHEST 2V
2 series · 2 of 2 positions shown · non-contrast
Comparison: None.

CLINICAL DATA: Atrial fibrillation

EXAM:
CHEST - 2 VIEW

[chest pa]
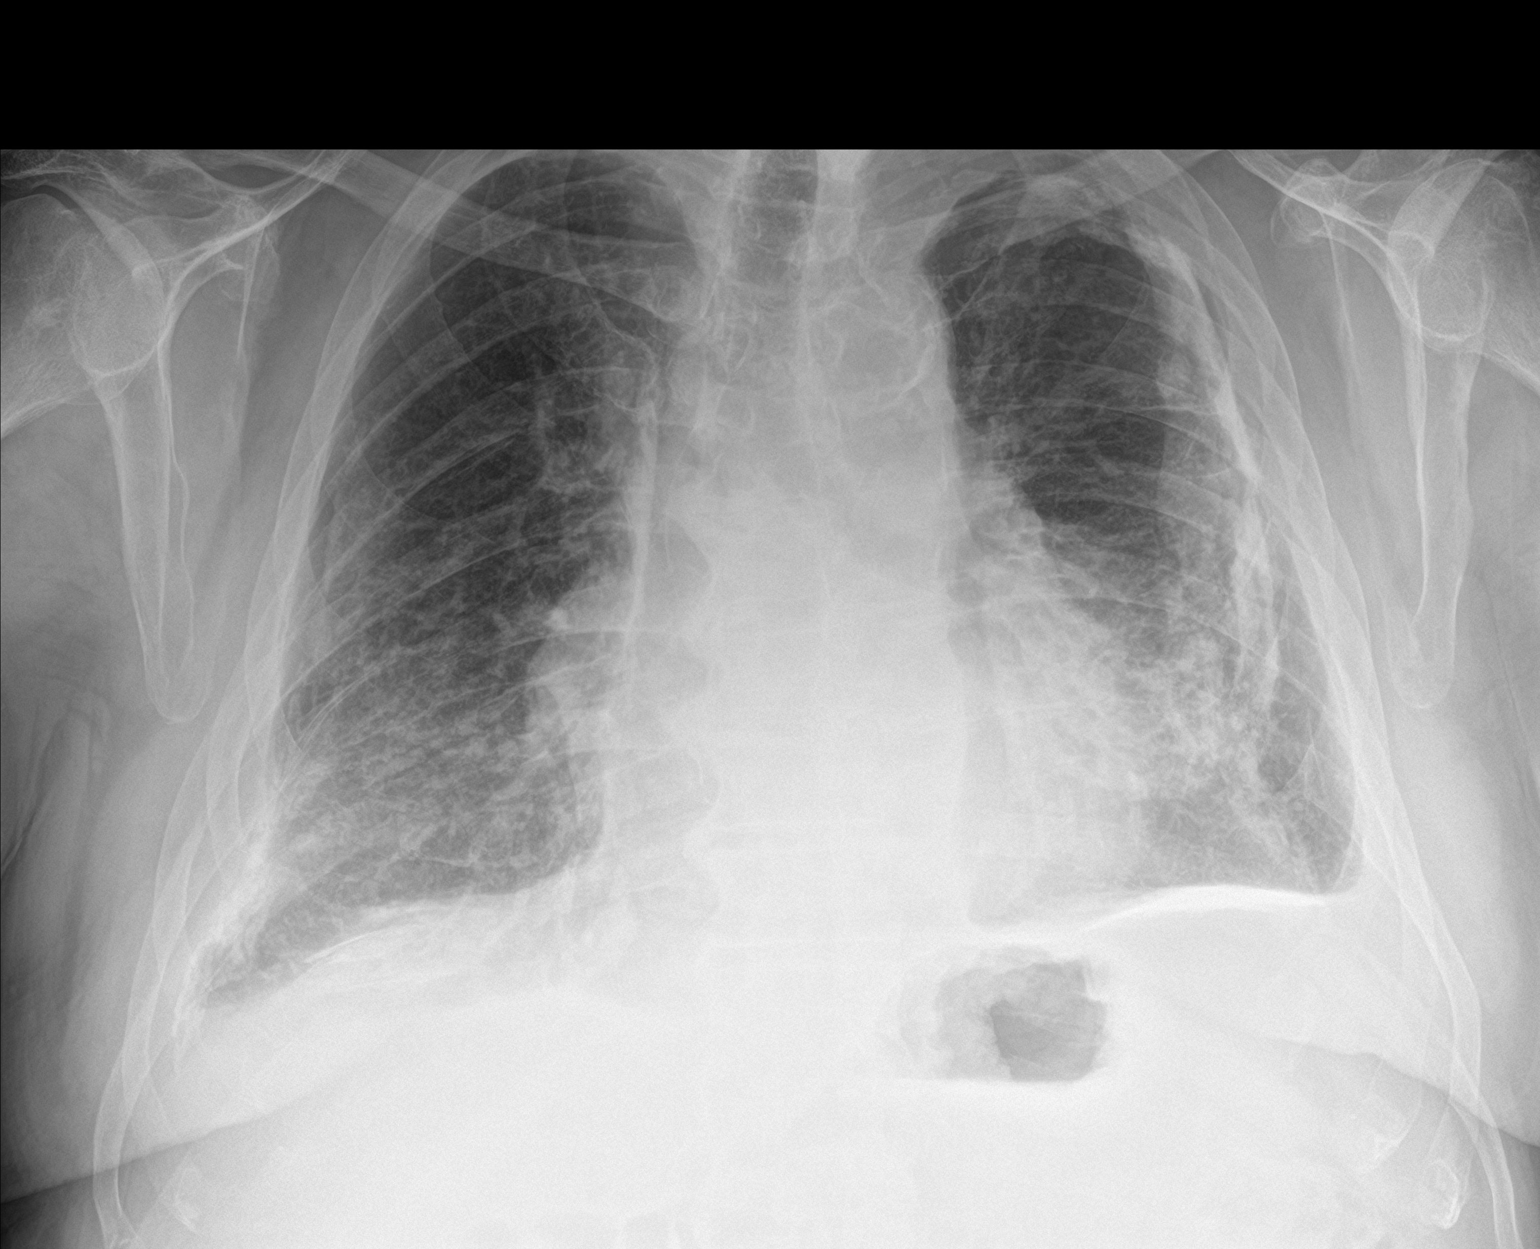

[chest lat]
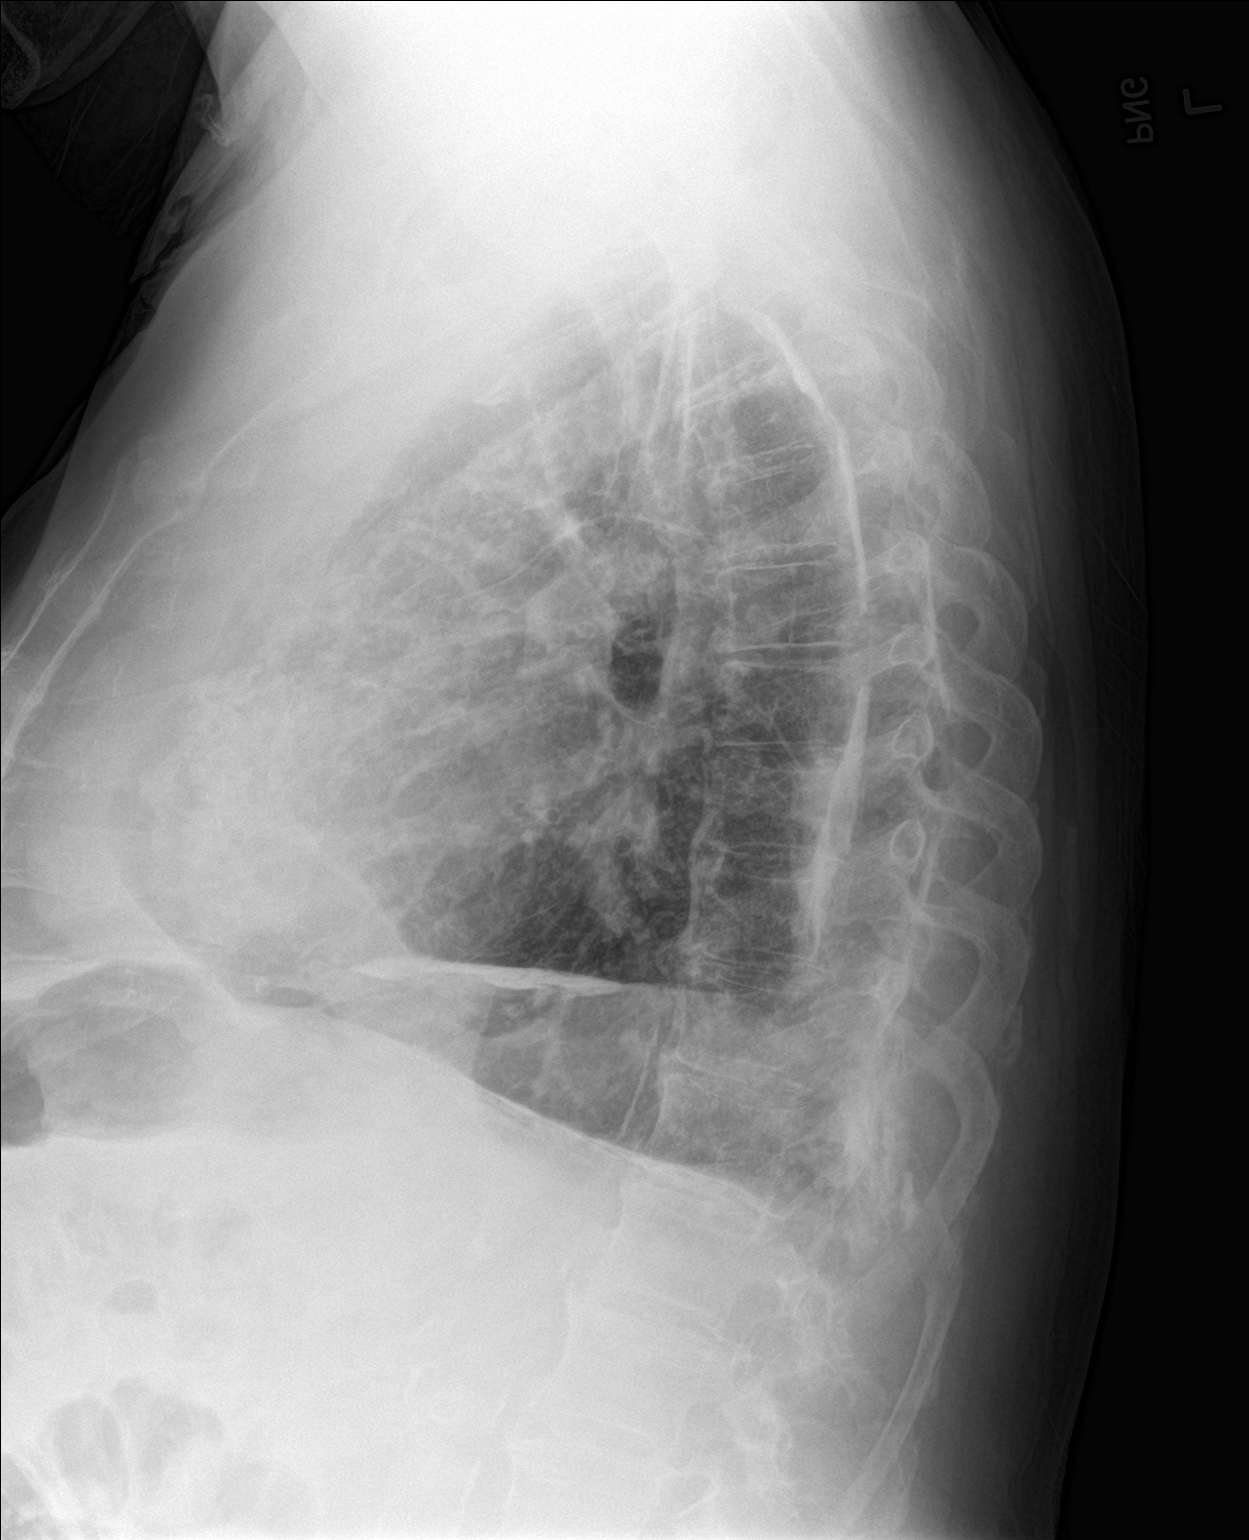

[2 of 2 positions shown; findings below may reference images not displayed]

FINDINGS: Dense pleural calcifications are seen bilaterally. There is volume
loss in both lungs. There is also pleural thickening most pronounced
at the lung bases. There is asymmetric pleural thickening at the
left apex. The lung parenchyma is difficult to evaluate due to the
profound pleural calcifications. Normal heart size. No pneumothorax.
Pleural effusion would be difficult to exclude.
IMPRESSION: There is extensive pleural calcification bilaterally most commonly
associated with prior exposure to mycobacterium. No definite acute
pneumonia or lung mass is identified.

There is asymmetric pleural thickening at the left apex, which is
again likely related to the diffuse pleural process described above.

## 2018-05-29 NOTE — Assessment & Plan Note (Signed)
He is maintained on BiPAP. We will obtain download from his machine. We will obtain prior sleep studies other from his pulmonologist told from his DME company for our records. He requests a new machine and we will see if he qualifies after we have obtained about information. DME is [facility]

## 2018-05-29 NOTE — Assessment & Plan Note (Signed)
Will obtain prior PFT results from his pulmonologist and CT chest he does not appear to be on any medications other than albuterol and will continue this for now . He is only on nocturnal oxygen. He does not desaturation and exertion does not need oxygen in the daytime

## 2018-05-29 NOTE — Patient Instructions (Signed)
Obtain CT scan, PFTs & Sleep studies from Dr [place]

## 2018-05-29 NOTE — Progress Notes (Addendum)
Subjective:    Patient ID: GEOFF DACANAY, male    DOB: 1941-10-16, 77 y.o.   MRN: [id number]  HPI   Chief Complaint  Patient presents with  . Pulm Consult    Referred by Jarold Motto PA for OSA and emphysema. Wants to establish care with a pulmonologist.     77 year old remote smoker presents to establish care for COPD, OSA.  He has also been diagnosed with asbestosis.  He is accompanied by his wife [name].  They used to live in prior to that in [facility].  They have just moved to [place].  He was diagnosed in 2002 with COPD and has been maintained on oxygen for many years.  He has a Designer, jewellery which he now uses only during sleep blended into his BiPAP device. He smoked about 2 packs/day before he quit in his 54s, more than 50 pack years. He also has a history of DVT/PE in 2002.  He was diagnosed with atrial fibrillation and is maintained on Eliquis, his last flare of CHF was in 2015.  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  His pulmonologist was Dr. Thedore Mins in [place] and Dr.Aziri in [place] and we are awaiting records from them.  His main complaint now is shortness of breath especially when it is hot and humid outside.  OSA was diagnosed many years ago and he is maintained on BiPAP machine, auto settings with nasal mask. Bedtime is between 11 PM and midnight, sleep latency is minimal, he sleeps on his side with one pillow, reports 3-4 nocturnal awakenings due to nocturia and is out of bed by 8 AM feeling rested without dryness of mouth or headaches.  He has lost 50 pounds over the last 4 years his current weight of 265 pounds. Epworth sleepiness score is 3 and he denies excessive daytime somnolence and fatigue. 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 Features of asbestosis are NOT  identified.   01/2018 titration >> bipap 18/12   11/2017 spiro ratio 86, FEV 1 45%, FVC 39 % , severe restriction ABG 12/2017 7.42/45/82/ 97% RA   Past Medical History:  Diagnosis Date  . Arthritis   . Atrial fibrillation (HCC)   . 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 GERD (gastroesophageal reflux disease)   . Kidney stones   . Obesity      Past Surgical History:  Procedure Laterality Date  . APPENDECTOMY  1963  . LITHOTRIPSY  1987  . NOSE SURGERY  2003  . TONSILLECTOMY AND ADENOIDECTOMY  1948  . UMBILICAL HERNIA REPAIR  1984    Allergies  Allergen Reactions  . Other Itching    Cats: watery eyes, itching, sneezing     Social History   Socioeconomic History  . Marital status: Married    Spouse name: Not on file  . Number of children: Not on file  . Years of education: Not on file  . Highest education level: Not on file  Occupational History  . Not on file  Social Needs  . Financial resource strain: Not on file  . Food insecurity:    Worry: Not on file    Inability: Not on file  . Transportation  needs:    Medical: Not on file    Non-medical: Not on file  Tobacco Use  . Smoking status: Former Smoker    Packs/day: 3.00    Years: 25.00    Pack years: 75.00    Types: Cigarettes    Last attempt to quit: 11/28/1984    Years since quitting: 33.5  . Smokeless tobacco: Never Used  Substance and Sexual Activity  . Alcohol use: Never    Frequency: Never  . Drug use: Never  . Sexual activity: Yes  Lifestyle  . Physical activity:    Days per week: Not on file    Minutes per session: Not on file  . Stress: Not on file  Relationships  . Social connections:    Talks on phone: Not on file    Gets together: Not on file    Attends religious service: Not on file    Active member of club or organization: Not on file    Attends  meetings of clubs or organizations: Not on file    Relationship status: Not on file  . Intimate partner violence:    Fear of current or ex partner: Not on file    Emotionally abused: Not on file    Physically abused: Not on file    Forced sexual activity: Not on file  Other Topics Concern  . 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 Arthritis Mother   . Hearing loss Mother   . Hypertension Mother   . Heart disease Mother   . Stroke Mother   . Arthritis Father   . Hearing loss Father   . Heart disease Father   . Hypertension Father   . Heart attack Father   . Kidney disease Father   . Stroke Father       Review of Systems  Constitutional: Negative for fever and unexpected weight change.  HENT: Negative for congestion, dental problem, ear pain, nosebleeds, postnasal drip, rhinorrhea, sinus pressure, sneezing, sore throat and trouble swallowing.   Eyes: Negative for redness and itching.  Respiratory: Positive for shortness of breath. Negative for cough, chest tightness and wheezing.   Cardiovascular: Negative for palpitations and leg swelling.  Gastrointestinal: Negative for nausea and vomiting.  Genitourinary: Negative for dysuria.  Musculoskeletal: Positive for joint swelling.  Skin: Negative for rash.  Allergic/Immunologic: Negative.  Negative for environmental allergies, food allergies and immunocompromised state.  Neurological: Negative for headaches.  Hematological: Does not bruise/bleed easily.  Psychiatric/Behavioral: Negative for dysphoric mood. The patient is not nervous/anxious.        Objective:   Physical  Exam  Gen. Pleasant, obese, elderly,in no distress, normal affect ENT - no lesions, no post nasal drip, class 2-3 airway Neck: No JVD, no thyromegaly, no carotid bruits Lungs: no use of accessory muscles, no dullness to percussion, decreased without rales or rhonchi  Cardiovascular: Rhythm regular, heart sounds  normal, no murmurs or gallops, 1+ peripheral edema Abdomen: soft and non-tender, no hepatosplenomegaly, BS normal. Musculoskeletal: No deformities, no cyanosis or clubbing Neuro:  alert, non focal, no tremors  Assessment & Plan:

## 2018-05-31 ENCOUNTER — Telehealth: Payer: Self-pay | Admitting: Pulmonary Disease

## 2018-05-31 NOTE — Telephone Encounter (Signed)
Called and spoke with pt regarding results. Someone has called pt already this morning. Nothing further needed at this time.

## 2018-06-25 ENCOUNTER — Other Ambulatory Visit: Payer: Self-pay | Admitting: Physician Assistant

## 2018-06-25 ENCOUNTER — Telehealth: Payer: Self-pay | Admitting: Pulmonary Disease

## 2018-06-25 NOTE — Telephone Encounter (Signed)
Called and spoke with Patient's wife.  Per RA last OV, no follow up listed.  Patient is needing new Bipap and to change DME.  [facility].  Patient is changing DME from the one [facility] [employer] [facility].  Patient requested appt.  Dr Reginia NaasAlva's schedule was full up to September.  Patient stated that he would see a NP.  Appointment made with Elisha HeadlandBrian Mack, NP, 07/09/18 at 1000.  Nothing further at this time.

## 2018-06-26 ENCOUNTER — Encounter: Payer: Self-pay | Admitting: *Deleted

## 2018-06-26 DIAGNOSIS — L989 Disorder of the skin and subcutaneous tissue, unspecified: Secondary | ICD-10-CM | POA: Insufficient documentation

## 2018-06-26 NOTE — Telephone Encounter (Signed)
Dr. Earlene PlaterWallace please review and advise refills. Pt has an appt with Cardiology on 7/31.

## 2018-06-27 ENCOUNTER — Institutional Professional Consult (permissible substitution): Payer: Medicare Other | Admitting: Pulmonary Disease

## 2018-07-04 ENCOUNTER — Ambulatory Visit (INDEPENDENT_AMBULATORY_CARE_PROVIDER_SITE_OTHER): Payer: Medicare Other | Admitting: Cardiovascular Disease

## 2018-07-04 ENCOUNTER — Encounter: Payer: Self-pay | Admitting: Cardiovascular Disease

## 2018-07-04 ENCOUNTER — Ambulatory Visit: Payer: Medicare Other | Admitting: Cardiovascular Disease

## 2018-07-04 VITALS — BP 116/74 | HR 81 | Ht 69.0 in | Wt 265.8 lb

## 2018-07-04 DIAGNOSIS — R0600 Dyspnea, unspecified: Secondary | ICD-10-CM

## 2018-07-04 DIAGNOSIS — R600 Localized edema: Secondary | ICD-10-CM | POA: Diagnosis not present

## 2018-07-04 DIAGNOSIS — R0609 Other forms of dyspnea: Secondary | ICD-10-CM | POA: Diagnosis not present

## 2018-07-04 DIAGNOSIS — I10 Essential (primary) hypertension: Secondary | ICD-10-CM

## 2018-07-04 DIAGNOSIS — E7849 Other hyperlipidemia: Secondary | ICD-10-CM | POA: Diagnosis not present

## 2018-07-04 DIAGNOSIS — I519 Heart disease, unspecified: Secondary | ICD-10-CM

## 2018-07-04 DIAGNOSIS — I482 Chronic atrial fibrillation, unspecified: Secondary | ICD-10-CM

## 2018-07-04 DIAGNOSIS — G4730 Sleep apnea, unspecified: Secondary | ICD-10-CM | POA: Diagnosis not present

## 2018-07-04 LAB — LIPID PANEL
CHOL/HDL RATIO: 3.4 ratio (ref 0.0–5.0)
Cholesterol, Total: 201 mg/dL — ABNORMAL HIGH (ref 100–199)
HDL: 59 mg/dL (ref >39)
LDL CALC: 108 mg/dL — AB (ref 0–99)
TRIGLYCERIDES: 171 mg/dL — AB (ref 0–149)
VLDL CHOLESTEROL CAL: 34 mg/dL (ref 5–40)

## 2018-07-04 LAB — HEPATIC FUNCTION PANEL
ALT: 19 IU/L (ref 0–44)
AST: 22 IU/L (ref 0–40)
Albumin: 4.2 g/dL (ref 3.5–4.8)
Alkaline Phosphatase: 86 IU/L (ref 39–117)
Bilirubin Total: 0.6 mg/dL (ref 0.0–1.2)
Bilirubin, Direct: 0.2 mg/dL (ref 0.00–0.40)
TOTAL PROTEIN: 7 g/dL (ref 6.0–8.5)

## 2018-07-04 NOTE — Assessment & Plan Note (Signed)
History of essential hypertension her blood pressure measured today 116/74.  He is on metoprolol.  Continue current meds at current dosing.

## 2018-07-04 NOTE — Assessment & Plan Note (Signed)
History of remote tobacco abuse having smoked greater than 100 pack years and stopped in 1985 with residual emphysema/COPD.

## 2018-07-04 NOTE — Patient Instructions (Signed)
Medication Instructions:  Your physician recommends that you continue on your current medications as directed. Please refer to the Current Medication list given to you today.   Labwork: Your physician recommends that you return for lab work in: TODAY (FASTING)   Testing/Procedures: Your physician has requested that you have an echocardiogram. Echocardiography is a painless test that uses sound waves to create images of your heart. It provides your doctor with information about the size and shape of your heart and how well your heart's chambers and valves are working. This procedure takes approximately one hour. There are no restrictions for this procedure.    Follow-Up: Your physician wants you to follow-up in: 12 MONTHS WITH DR. Allyson SabalBERRY. You will receive a reminder letter in the mail two months in advance. If you don't receive a letter, please call our office to schedule the follow-up appointment.   Any Other Special Instructions Will Be Listed Below (If Applicable).     If you need a refill on your cardiac medications before your next appointment, please call your pharmacy.

## 2018-07-04 NOTE — Progress Notes (Signed)
07/04/2018 Maurice Reed   [birth year]  [id number]  Primary Physician Maurice MottoWorley, Samantha, PA Primary Cardiologist: Maurice GessJonathan J Dyna Figuereo MD Maurice CalamityFACP, FACC, FAHA, [place]  HPI:  Maurice Reed is a 77 y.o. moderately overweight married Caucasian male (wife is Maurice Reed), father of 5, grandfather [id number] grandchildren who is retired from working in the [employer] industry.  He was referred by Maurice MottoSamantha Worley, PA-C to be established in our practice because of prior cardiac history.  He has a greater than 100-pack-year history tobacco abuse having quit in 1985 and smoked 3 packs a day at that time.  He does have treated hypertension, obstructive sleep apnea on BiPAP.  He does wear oxygen nightly.  He has chronic [name] and a history of remote PE on Eliquis oral anticoagulation.  He is never had a heart attack or stroke.  He has chronic shortness of breath but denies chest pain.   Current Meds  Medication Sig  . albuterol (PROVENTIL HFA) 108 (90 Base) MCG/ACT inhaler Inhale into the lungs.  Marland Kitchen. atorvastatin (LIPITOR) 40 MG tablet Take 1 tablet (40 mg total) by mouth daily.  . Atropine Sulfate-NaCl 0.01-0.9 % SOLN Apply to eye.  . Cholecalciferol (VITAMIN D3) 3000 units TABS Take by mouth.  . clobetasol (TEMOVATE) 0.05 % external solution APPLY TO AFFECTED AREAS ON SCALP ONE TO TWO TIMES D UTD  . docusate sodium (COLACE) 100 MG capsule Take by mouth.  . doxycycline (VIBRA-TABS) 100 MG tablet TAKE 1 TABLET BY MOUTH DAILY  . ELIQUIS 5 MG TABS tablet TAKE 1 TABLET BY MOUTH TWICE DAILY  . erythromycin ophthalmic ointment APP THIN LAYER IN OS BID  . fluticasone (FLONASE) 50 MCG/ACT nasal spray Place into the nose.  . loratadine (CLARITIN) 10 MG tablet Take 10 mg by mouth daily.  . Magnesium 400 MG TABS Take by mouth.  Marland Kitchen. MAGNESIUM-OXIDE 400 (241.3 Mg) MG tablet TK 1 T PO BID  . metoprolol succinate (TOPROL-XL) 25 MG 24 hr tablet TK 1 T PO D  . Naloxone HCl (EVZIO) 2 MG/0.4ML SOAJ by Injection route as directed.  Marland Kitchen. oxyCODONE  (OXY IR/ROXICODONE) 5 MG immediate release tablet Take 5 mg by mouth 2 (two) times daily as needed. for pain  . potassium chloride (MICRO-K) 10 MEQ CR capsule TK 1 C PO BID  . prednisoLONE acetate (PRED FORTE) 1 % ophthalmic suspension SHAKE LQ AND INT 1 GTT IN OS QD PRN  . tadalafil (CIALIS) 10 MG tablet Take 1 tablet (10 mg total) by mouth daily as needed for erectile dysfunction.  . Tafluprost (ZIOPTAN) 0.0015 % SOLN Apply to eye.  . tamsulosin (FLOMAX) 0.4 MG CAPS capsule TK 1 C PO HS  . temazepam (RESTORIL) 15 MG capsule TAKE ONE CAPSULE BY MOUTH EVERY NIGHT AT BEDTIME AS NEEDED FOR SLEEP  . torsemide (DEMADEX) 20 MG tablet TK 1 T PO BID     Allergies  Allergen Reactions  . Other Itching    Cats: watery eyes, itching, sneezing    Social History   Socioeconomic History  . Marital status: Married    Spouse name: Not on file  . Number of children: Not on file  . Years of education: Not on file  . Highest education level: Not on file  Occupational History  . Not on file  Social Needs  . Financial resource strain: Not on file  . Food insecurity:    Worry: Not on file    Inability: Not on file  . Transportation needs:  Medical: Not on file    Non-medical: Not on file  Tobacco Use  . Smoking status: Former Smoker    Packs/day: 3.00    Years: 25.00    Pack years: 75.00    Types: Cigarettes    Last attempt to quit: 11/28/1984    Years since quitting: 33.6  . Smokeless tobacco: Never Used  Substance and Sexual Activity  . Alcohol use: Never    Frequency: Never  . Drug use: Never  . Sexual activity: Yes  Lifestyle  . Physical activity:    Days per week: Not on file    Minutes per session: Not on file  . Stress: Not on file  Relationships  . Social connections:    Talks on phone: Not on file    Gets together: Not on file    Attends religious service: Not on file    Active member of club or organization: Not on file    Attends meetings of clubs or organizations:  Not on file    Relationship status: Not on file  . Intimate partner violence:    Fear of current or ex partner: Not on file    Emotionally abused: Not on file    Physically abused: Not on file    Forced sexual activity: Not on file  Other Topics Concern  . 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  Cardiovascular: negative for chest pain, dyspnea on exertion, edema, orthopnea, palpitations, paroxysmal nocturnal dyspnea or shortness of breath Dermatological: negative for rash Respiratory: negative for cough or wheezing Urologic: negative for hematuria Abdominal: negative for nausea, vomiting, diarrhea, bright red blood per rectum, melena, or hematemesis Neurologic: negative for visual changes, syncope, or dizziness All other systems reviewed and are otherwise negative except as noted above.    Blood pressure 116/74, pulse 81, height 5\' 9"  (1.753 m), weight 265 lb 12.8 oz (120.6 kg).  General appearance: alert and no distress Neck: no adenopathy, no carotid bruit, no JVD, supple, symmetrical, trachea midline and thyroid not enlarged, symmetric, no tenderness/mass/nodules Lungs: clear to auscultation bilaterally Heart: irregularly irregular rhythm Extremities: 1-2+ pitting edema bilaterally Pulses: 2+ and symmetric Skin: Skin color, texture, turgor normal. No rashes or lesions Neurologic: Alert and oriented X 3, normal strength and tone. Normal symmetric reflexes. Normal coordination and gait  EKG showed fibrillation with a ventricular response of 81 with ST and T wave changes.  I personally reviewed this  EKG.  ASSESSMENT AND PLAN:   Atrial fibrillation (HCC) History of chronic [name] rate controlled on Eliquis oral anticoagulation and metoprolol..  Sleep apnea History of obstructive sleep apnea on BiPAP  Essential hypertension History of essential hypertension her blood pressure measured today 116/74.  He is on metoprolol.  Continue current meds at current dosing.  Former smoker History of remote tobacco abuse having smoked greater than 100 pack years and stopped in 1985 with residual emphysema/COPD.  Bilateral lower extremity edema On torsemide and compression stockings.      Maurice Gess MD FACP,FACC,FAHA, [facility] 07/04/2018 9:50 AM

## 2018-07-04 NOTE — Assessment & Plan Note (Signed)
On torsemide and compression stockings.

## 2018-07-04 NOTE — Assessment & Plan Note (Signed)
History of chronic [name] rate controlled on Eliquis oral anticoagulation and metoprolol..Marland Kitchen

## 2018-07-04 NOTE — Assessment & Plan Note (Signed)
History of obstructive sleep apnea on BiPAP 

## 2018-07-05 ENCOUNTER — Encounter: Payer: Self-pay | Admitting: *Deleted

## 2018-07-06 ENCOUNTER — Other Ambulatory Visit: Payer: Self-pay | Admitting: Family Medicine

## 2018-07-06 NOTE — Telephone Encounter (Signed)
He has been on this daily for rosacea, will refill.

## 2018-07-06 NOTE — Telephone Encounter (Signed)
Please advise should patient be on this?

## 2018-07-09 ENCOUNTER — Encounter: Payer: Self-pay | Admitting: Pulmonary Disease

## 2018-07-09 ENCOUNTER — Telehealth: Payer: Self-pay | Admitting: *Deleted

## 2018-07-09 ENCOUNTER — Ambulatory Visit (INDEPENDENT_AMBULATORY_CARE_PROVIDER_SITE_OTHER): Payer: Medicare Other | Admitting: Pulmonary Disease

## 2018-07-09 VITALS — BP 130/80 | HR 69 | Ht 69.5 in | Wt 270.6 lb

## 2018-07-09 DIAGNOSIS — Z6839 Body mass index (BMI) 39.0-39.9, adult: Secondary | ICD-10-CM

## 2018-07-09 DIAGNOSIS — M1990 Unspecified osteoarthritis, unspecified site: Secondary | ICD-10-CM

## 2018-07-09 DIAGNOSIS — Z87891 Personal history of nicotine dependence: Secondary | ICD-10-CM

## 2018-07-09 DIAGNOSIS — G894 Chronic pain syndrome: Secondary | ICD-10-CM

## 2018-07-09 DIAGNOSIS — G4730 Sleep apnea, unspecified: Secondary | ICD-10-CM | POA: Diagnosis not present

## 2018-07-09 DIAGNOSIS — E669 Obesity, unspecified: Secondary | ICD-10-CM | POA: Diagnosis not present

## 2018-07-09 NOTE — Assessment & Plan Note (Signed)
Will place order for new BiPAP with [facility] >>>BiPAP settings 18/12 >>> 6-week download to ensure compliance as well as treatment  Keep up the hard work using your device.   Remember:  . Do not drive or operate heavy machinery if tired or drowsy.  . Please notify the supply company and office if you are unable to use your device regularly due to missing supplies or machine being broken.  . Work on maintaining a healthy weight and following your recommended nutrition plan  . Maintain proper daily exercise and movement  . Maintaining proper use of your device can also help improve management of other chronic illnesses such as: Blood pressure, blood sugars, and weight management.   BIPAP Cleaning:  Clean weekly, with Dawn soap, and bottle brush.  Set up to air dry.  Keep follow up with PCP and Cardiology  >>> Follow-up with primary care for pain management referral   Follow-up in 2 months

## 2018-07-09 NOTE — Assessment & Plan Note (Signed)
Continue not smoking 

## 2018-07-09 NOTE — Telephone Encounter (Signed)
[facility]aurice Reed [facility]  Maurice Reed -- please let patient (or wife) know that we are resending to a different pain mgmt clinic.  Maurice Reed Arney PA-C

## 2018-07-09 NOTE — Patient Instructions (Addendum)
Will place order for new BiPAP with [facility] >>>BiPAP settings 18/12 >>> 6-week download to ensure compliance as well as treatment  Keep up the hard work using your device.   Remember:  . Do not drive or operate heavy machinery if tired or drowsy.  . Please notify the supply company and office if you are unable to use your device regularly due to missing supplies or machine being broken.  . Work on maintaining a healthy weight and following your recommended nutrition plan  . Maintain proper daily exercise and movement  . Maintaining proper use of your device can also help improve management of other chronic illnesses such as: Blood pressure, blood sugars, and weight management.   BIPAP Cleaning:  Clean weekly, with Dawn soap, and bottle brush.  Set up to air dry.  Keep follow up with PCP and Cardiology  >>> Follow-up with primary care for pain management referral   Follow-up in 2 months    Please contact the office if your symptoms worsen or you have concerns that you are not improving.   Thank you for choosing [facility] for your healthcare, and for allowing us to partner with you on your healthcare journey. I am thankful to be able to provide care to you today.   Maurice HeadlandBrian Kanika Bungert FNP-C     CPAP and BiPAP Information CPAP and BiPAP are methods of helping a person breathe with the use of air pressure. CPAP stands for "continuous positive airway pressure." BiPAP stands for "bi-level positive airway pressure." In both methods, air is blown through your nose or mouth and into your air passages to help you breathe well. CPAP and BiPAP use different amounts of pressure to blow air. With CPAP, the amount of pressure stays the same while you breathe in and out. With BiPAP, the amount of pressure is increased when you breathe in (inhale) so that you can take larger breaths. Your health care provider will recommend whether CPAP or BiPAP would be more helpful for you. Why are CPAP and  BiPAP treatments used? CPAP or BiPAP can be helpful if you have:  Sleep apnea.  Chronic obstructive pulmonary disease (COPD).  Heart failure.  Medical conditions that weaken the muscles of the chest including muscular dystrophy, or neurological diseases such as amyotrophic lateral sclerosis (ALS).  Other problems that cause breathing to be weak, abnormal, or difficult.  CPAP is most commonly used for obstructive sleep apnea (OSA) to keep the airways from collapsing when the muscles relax during sleep. How is CPAP or BiPAP administered? Both CPAP and BiPAP are provided by a small machine with a flexible plastic tube that attaches to a plastic mask. You wear the mask. Air is blown through the mask into your nose or mouth. The amount of pressure that is used to blow the air can be adjusted on the machine. Your health care provider will determine the pressure setting that should be used based on your individual needs. When should CPAP or BiPAP be used? In most cases, the mask only needs to be worn during sleep. Generally, the mask needs to be worn throughout the night and during any daytime naps. People with certain medical conditions may also need to wear the mask at other times when they are awake. Follow instructions from your health care provider about when to use the machine. What are some tips for using the mask?  Because the mask needs to be snug, some people feel trapped or closed-in (claustrophobic) when first using  the mask. If you feel this way, you may need to get used to the mask. One way to do this is by holding the mask loosely over your nose or mouth and then gradually applying the mask more snugly. You can also gradually increase the amount of time that you use the mask.  Masks are available in various types and sizes. Some fit over your mouth and nose while others fit over just your nose. If your mask does not fit well, talk with your health care provider about getting a different  one.  If you are using a mask that fits over your nose and you tend to breathe through your mouth, a chin strap may be applied to help keep your mouth closed.  The CPAP and BiPAP machines have alarms that may sound if the mask comes off or develops a leak.  If you have trouble with the mask, it is very important that you talk with your health care provider about finding a way to make the mask easier to tolerate. Do not stop using the mask. Stopping the use of the mask could have a negative impact on your health. What are some tips for using the machine?  Place your CPAP or BiPAP machine on a secure table or stand near an electrical outlet.  Know where the on/off switch is located on the machine.  Follow instructions from your health care provider about how to set the pressure on your machine and when you should use it.  Do not eat or drink while the CPAP or BiPAP machine is on. Food or fluids could get pushed into your lungs by the pressure of the CPAP or BiPAP.  Do not smoke. Tobacco smoke residue can damage the machine.  For home use, CPAP and BiPAP machines can be rented or purchased through home health care companies. Many different brands of machines are available. Renting a machine before purchasing may help you find out which particular machine works well for you.  Keep the CPAP or BiPAP machine and attachments clean. Ask your health care provider for specific instructions. Get help right away if:  You have redness or open areas around your nose or mouth where the mask fits.  You have trouble using the CPAP or BiPAP machine.  You cannot tolerate wearing the CPAP or BiPAP mask.  You have pain, discomfort, and bloating in your abdomen. Summary  CPAP and BiPAP are methods of helping a person breathe with the use of air pressure.  Both CPAP and BiPAP are provided by a small machine with a flexible plastic tube that attaches to a plastic mask.  If you have trouble with the  mask, it is very important that you talk with your health care provider about finding a way to make the mask easier to tolerate. This information is not intended to replace advice given to you by your health care provider. Make sure you discuss any questions you have with your health care provider. Document Released: 08/19/2004 Document Revised: 10/10/2016 Document Reviewed: 10/10/2016 Elsevier Interactive Patient Education  2017 [employer].

## 2018-07-09 NOTE — Telephone Encounter (Signed)
Samantha please see message and advise. 

## 2018-07-09 NOTE — Telephone Encounter (Signed)
Copied from CRM [id number]. Topic: Referral - Status >> Jul 09, 2018  1:24 PM Lorayne BenderAlexander, Amber L wrote: Reason for CRM:   Pt's wife, [name], calling.  States that the referral placed for pt's pain management isn't going to work out - the office states there is nothing that they can offer pt.  [place] but [place] too far. They would like to know what next steps are.  Call back number [phone]

## 2018-07-09 NOTE — Assessment & Plan Note (Signed)
Continue to work towards healthy weight Continue monitor weight regularly

## 2018-07-09 NOTE — Progress Notes (Signed)
@Patient  ID: Maurice Reed, male    DOB: 08-15-1941, 77 y.o.   MRN: [id number]  Chief Complaint  Patient presents with  . Follow-up    OSA follow-up -reporting he needs new BiPAP    Referring provider: Jarold Motto, PA  HPI: 77 year old remote smoker referred to our office on 05/29/2018 to be established with local pulmonologist for management of COPD as well as OSA.  On BiPAP.  Also using nighttime oxygen.  Patient has also been diagnosed with asbestosis. PMH: History of DVT and PE in 2002, 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 Vassie Loll  Former smoker.  More than 50 pack years.   Recent [facility] Encounters:   05/29/18-initial office visit-[name] 77 year old male patient was recently moved to [place] from the [employer].  Accompanied by his wife Maurice Reed.  Patient with diagnosis of COPD, OSA, asbestosis.  Patient uses oxygen at night as well as occasionally during the day.  He is also has a POC.  Blended into his BiPAP device at night.  Patient was previously established with Dr. Thedore Mins in [place] as well as Dr.'s Erie in [place] or awaiting records from them.  His main complaint today is shortness of breath especially when it is hot humid outside.  OSA is treated with BiPAP.  With blunted oxygen.  Bedtime is between 11 PM and midnight, sleep latency is minimal, he sleeps on his side with one pillow, reports 3-4 nocturnal awakenings due to nocturia and is out of bed by 8 AM feeling rested without dryness of mouth or headaches.  Epworth's of 3.  Patient reports he is recently lost 50 pounds over the last 4 years.  Current weight of 265.  Walked in office today was able to complete 185 feet.  Oxygen saturations remain 94%. Plan: Maintain BiPAP, does not need oxygen during the daytime, obtain records from previous office, obtain CT scan, PFTs, sleep studies from Dr. 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 Features of asbestosis are NOT  identified.  01/23/2018 titration >> bipap 18/12  >>> Recommend BiPAP 18/12 CWP, weight loss/exercise program 11/2017 spiro ratio 86, FEV 1 45%, FVC 39 % , severe restriction ABG 12/2017 7.42/45/82/ 97% RA      07/09/18  OV  77 year old patient seen office visit today.  Patient reports they recently moved to [place] now.  Patient was previously with [facility] patient for DME now is with [facility] is [facility] patient.  Patient reports his BiPAP was over 53 years old.  He is also using 2 L nocturnal oxygen.  We have requested his records from his previous pulmonologist based on [place].  We have received the last BiPAP sleep study report which is from 01/23/18.  Showing that patient should be BiPAP settings of 18/12.  Patient reports that he is on settings of 9/4.  Patient also reporting that he is getting warning messages from his BiPAP saying that he needs to replace it.  Patient feels that his sleep is not adequately managed and that he is more fatigued than usual.  Patient is still reporting nocturia episodes of 3-4 times a night.  Patient reports he is been established with primary care, cardiology, has a referral to pain management.   Allergies  Allergen Reactions  . Other Itching  Cats: watery eyes, itching, sneezing    Immunization History  Administered Date(s) Administered  . Influenza, Seasonal, Injecte, Preservative Fre 09/28/2017  . Pneumococcal Polysaccharide-23 12/14/2015    Past Medical History:  Diagnosis Date  . Arthritis   . Atrial fibrillation (HCC)   . 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 GERD (gastroesophageal reflux disease)   . Kidney stones   . Obesity     Tobacco History: Social History   Tobacco Use  Smoking  Status Former Smoker  . Packs/day: 3.00  . Years: 25.00  . Pack years: 75.00  . Types: Cigarettes  . Last attempt to quit: 11/28/1984  . Years since quitting: 33.6  Smokeless Tobacco Never Used   Counseling given: Yes Continue not smoking  Outpatient Encounter Medications as of 07/09/2018  Medication Sig  . albuterol (PROVENTIL HFA) 108 (90 Base) MCG/ACT inhaler Inhale into the lungs.  Marland Kitchen atorvastatin (LIPITOR) 40 MG tablet Take 1 tablet (40 mg total) by mouth daily.  . Atropine Sulfate-NaCl 0.01-0.9 % SOLN Apply to eye.  . Cholecalciferol (VITAMIN D3) 3000 units TABS Take by mouth.  . clobetasol (TEMOVATE) 0.05 % external solution APPLY TO AFFECTED AREAS ON SCALP ONE TO TWO TIMES D UTD  . docusate sodium (COLACE) 100 MG capsule Take by mouth.  . doxycycline (VIBRA-TABS) 100 MG tablet TAKE 1 TABLET BY MOUTH EVERY DAY  . ELIQUIS 5 MG TABS tablet TAKE 1 TABLET BY MOUTH TWICE DAILY  . erythromycin ophthalmic ointment APP THIN LAYER IN OS BID  . fluticasone (FLONASE) 50 MCG/ACT nasal spray Place into the nose.  . loratadine (CLARITIN) 10 MG tablet Take 10 mg by mouth daily.  Marland Kitchen MAGNESIUM-OXIDE 400 (241.3 Mg) MG tablet TK 1 T PO BID  . metoprolol succinate (TOPROL-XL) 25 MG 24 hr tablet TK 1 T PO D  . Naloxone HCl (EVZIO) 2 MG/0.4ML SOAJ by Injection route as directed.  Marland Kitchen oxyCODONE (OXY IR/ROXICODONE) 5 MG immediate release tablet Take 5 mg by mouth 2 (two) times daily as needed. for pain  . potassium chloride (MICRO-K) 10 MEQ CR capsule TK 1 C PO BID  . prednisoLONE acetate (PRED FORTE) 1 % ophthalmic suspension SHAKE LQ AND INT 1 GTT IN OS QD PRN  . tadalafil (CIALIS) 10 MG tablet Take 1 tablet (10 mg total) by mouth daily as needed for erectile dysfunction.  . Tafluprost (ZIOPTAN) 0.0015 % SOLN Apply to eye.  . tamsulosin (FLOMAX) 0.4 MG CAPS capsule TK 1 C PO HS  . temazepam (RESTORIL) 15 MG capsule TAKE ONE CAPSULE BY MOUTH EVERY NIGHT AT BEDTIME AS NEEDED FOR SLEEP  . torsemide  (DEMADEX) 20 MG tablet TK 1 T PO BID  . [DISCONTINUED] Magnesium 400 MG TABS Take by mouth.   No facility-administered encounter medications on file as of 07/09/2018.      Review of Systems  Constitutional: + Fatigue and sleepiness, feels this is been progressively worsening over the past couple of months no  weight loss, night sweats,  fevers, chills HEENT:   No headaches,  Difficulty swallowing,  Tooth/dental problems, or  Sore throat, No sneezing, itching, ear ache, nasal congestion, post nasal drip  CV: +Bilateral lower extremity swelling, wears compression socks no chest pain,  orthopnea, PND, anasarca, dizziness, palpitations, syncope  GI: No heartburn, indigestion, abdominal pain, nausea, vomiting, diarrhea, change in bowel habits, loss of appetite, bloody stools Resp: + Occasional shortness of breath with exertion no shortness of breath  at  rest.  No excess mucus, no productive cough,  No non-productive cough,  No coughing up of blood.  No change in color of mucus.  No wheezing.  No chest wall deformity Skin: no rash, lesions, no skin changes. GU: no dysuria, change in color of urine, no urgency or frequency.  No flank pain, no hematuria  MS:  No joint pain or swelling.  No decreased range of motion.  No back pain. Psych:  No change in mood or affect. No depression or anxiety.  No memory loss.    Physical Exam  BP 130/80 (BP Location: Left Arm, Cuff Size: Normal)   Pulse 69   Ht 5' 9.5" (1.765 m)   Wt 270 lb 9.6 oz (122.7 kg)   SpO2 99%   BMI 39.39 kg/m   Wt Readings from Last 5 Encounters:  07/09/18 270 lb 9.6 oz (122.7 kg)  07/04/18 265 lb 12.8 oz (120.6 kg)  05/29/18 265 lb 12.8 oz (120.6 kg)  05/22/18 269 lb 4 oz (122.1 kg)   >>> Weight increased from last appointment.  Discussed with patient.   Physical Exam  Constitutional: He is oriented to person, place, and time and well-developed, well-nourished, and in no distress. No distress.  HENT:  Head: Normocephalic  and atraumatic.  Right Ear: Hearing, tympanic membrane, external ear and ear canal normal.  Left Ear: Hearing, tympanic membrane, external ear and ear canal normal.  Mouth/Throat: Uvula is midline and oropharynx is clear and moist. No oropharyngeal exudate.  Mallampati 3  Eyes: Pupils are equal, round, and reactive to light.  Neck: Normal range of motion. Neck supple. No JVD present.  Cardiovascular: Normal rate, regular rhythm and normal heart sounds.  Pulmonary/Chest: Effort normal and breath sounds normal. No accessory muscle usage. No respiratory distress. He has no decreased breath sounds. He has no wheezes. He has no rhonchi.  Abdominal: Soft. Bowel sounds are normal. There is no tenderness.  Musculoskeletal: Normal range of motion. He exhibits edema (With lower extremity swelling, bilaterally, with compression stockings on).  Lymphadenopathy:    He has no cervical adenopathy.  Neurological: He is alert and oriented to person, place, and time. Gait normal.  Skin: Skin is warm and dry. He is not diaphoretic. No erythema.  Psychiatric: Mood, memory, affect and judgment normal.  Nursing note and vitals reviewed.    Lab Results:  CBC No results found for: WBC, RBC, HGB, HCT, PLT, MCV, MCH, MCHC, RDW, LYMPHSABS, MONOABS, EOSABS, BASOSABS  BMET    Component Value Date/Time   NA 140 05/22/2018 1208   K 4.6 05/22/2018 1208   CL 100 05/22/2018 1208   CO2 34 (H) 05/22/2018 1208   GLUCOSE 98 05/22/2018 1208   BUN 16 05/22/2018 1208   CREATININE 0.85 05/22/2018 1208   CALCIUM 9.8 05/22/2018 1208    BNP No results found for: BNP  ProBNP No results found for: PROBNP  Imaging: No results found.   Assessment & Plan:   Pleasant 77 year old patient seen office visit today.  Reviewed BiPAP titration study performed in 01/23/2018.  We will proceed forward with ordering a new BiPAP.  Discussed with patient recent weight increase.  To continue to monitor.  Patient also will proceed  forward with purchasing new compression stockings as the ones he was wearing in office visit today are little loose.  We will have patient follow-up with us in 2 months.   Patient to get flu vaccine when available.  Sleep apnea Will place order for new BiPAP  with [facility] >>>BiPAP settings 18/12 >>> 6-week download to ensure compliance as well as treatment  Keep up the hard work using your device.   Remember:  . Do not drive or operate heavy machinery if tired or drowsy.  . Please notify the supply company and office if you are unable to use your device regularly due to missing supplies or machine being broken.  . Work on maintaining a healthy weight and following your recommended nutrition plan  . Maintain proper daily exercise and movement  . Maintaining proper use of your device can also help improve management of other chronic illnesses such as: Blood pressure, blood sugars, and weight management.   BIPAP Cleaning:  Clean weekly, with Dawn soap, and bottle brush.  Set up to air dry.  Keep follow up with PCP and Cardiology  >>> Follow-up with primary care for pain management referral   Follow-up in 2 months    Former smoker Continue not smoking  Obesity Continue to work towards healthy weight Continue monitor weight regularly     Coral Ceo, NP 07/09/2018

## 2018-07-10 ENCOUNTER — Other Ambulatory Visit: Payer: Self-pay | Admitting: Physician Assistant

## 2018-07-10 DIAGNOSIS — G894 Chronic pain syndrome: Secondary | ICD-10-CM

## 2018-07-10 DIAGNOSIS — M1990 Unspecified osteoarthritis, unspecified site: Secondary | ICD-10-CM

## 2018-07-10 NOTE — Telephone Encounter (Signed)
Spoke to pt told him we placed a new referral to a different pain management office and someone should contact you to schedule an appointment. If you do not hear from someone within the next 2-3 weeks please let us know. Pt verbalized understanding.

## 2018-07-10 NOTE — Telephone Encounter (Signed)
New order in. Thanks!  Jarold MottoSamantha Shareen Capwell PA-C

## 2018-07-10 NOTE — Telephone Encounter (Signed)
Due to timing on referral and listed denial, I would recommend placing a new referral for pain management- [facility] and we will see if we can't the patient in with them.

## 2018-07-13 ENCOUNTER — Other Ambulatory Visit: Payer: Self-pay

## 2018-07-13 ENCOUNTER — Ambulatory Visit (HOSPITAL_COMMUNITY): Payer: Medicare Other | Attending: Internal Medicine

## 2018-07-13 DIAGNOSIS — R0600 Dyspnea, unspecified: Secondary | ICD-10-CM | POA: Insufficient documentation

## 2018-07-13 DIAGNOSIS — I272 Pulmonary hypertension, unspecified: Secondary | ICD-10-CM | POA: Insufficient documentation

## 2018-07-13 DIAGNOSIS — E785 Hyperlipidemia, unspecified: Secondary | ICD-10-CM | POA: Insufficient documentation

## 2018-07-13 DIAGNOSIS — R0609 Other forms of dyspnea: Secondary | ICD-10-CM | POA: Diagnosis not present

## 2018-07-13 DIAGNOSIS — G4730 Sleep apnea, unspecified: Secondary | ICD-10-CM

## 2018-07-13 DIAGNOSIS — I10 Essential (primary) hypertension: Secondary | ICD-10-CM | POA: Insufficient documentation

## 2018-07-13 DIAGNOSIS — I519 Heart disease, unspecified: Secondary | ICD-10-CM | POA: Diagnosis not present

## 2018-07-13 DIAGNOSIS — E7849 Other hyperlipidemia: Secondary | ICD-10-CM | POA: Diagnosis not present

## 2018-07-13 DIAGNOSIS — I482 Chronic atrial fibrillation, unspecified: Secondary | ICD-10-CM

## 2018-07-13 DIAGNOSIS — I081 Rheumatic disorders of both mitral and tricuspid valves: Secondary | ICD-10-CM | POA: Insufficient documentation

## 2018-07-13 DIAGNOSIS — Z87891 Personal history of nicotine dependence: Secondary | ICD-10-CM | POA: Insufficient documentation

## 2018-07-23 ENCOUNTER — Other Ambulatory Visit: Payer: Self-pay | Admitting: Family Medicine

## 2018-07-26 NOTE — Telephone Encounter (Signed)
Pts wife calling to check status on temazepam being refilled.

## 2018-07-27 NOTE — Addendum Note (Signed)
Addended by: Dorian PodWHEELEY, JAMIE J on: 07/27/2018 04:04 PM   Modules accepted: Orders

## 2018-07-27 NOTE — Telephone Encounter (Signed)
Please advise 

## 2018-07-28 MED ORDER — TEMAZEPAM 15 MG PO CAPS: ORAL_CAPSULE | ORAL | 0 refills | Status: DC

## 2018-08-09 DIAGNOSIS — M47816 Spondylosis without myelopathy or radiculopathy, lumbar region: Secondary | ICD-10-CM | POA: Diagnosis not present

## 2018-08-09 DIAGNOSIS — Z79899 Other long term (current) drug therapy: Secondary | ICD-10-CM | POA: Diagnosis not present

## 2018-08-09 DIAGNOSIS — G894 Chronic pain syndrome: Secondary | ICD-10-CM | POA: Diagnosis not present

## 2018-08-09 DIAGNOSIS — M129 Arthropathy, unspecified: Secondary | ICD-10-CM | POA: Diagnosis not present

## 2018-08-22 DIAGNOSIS — G894 Chronic pain syndrome: Secondary | ICD-10-CM | POA: Diagnosis not present

## 2018-08-22 DIAGNOSIS — M47816 Spondylosis without myelopathy or radiculopathy, lumbar region: Secondary | ICD-10-CM | POA: Diagnosis not present

## 2018-08-22 DIAGNOSIS — Z79899 Other long term (current) drug therapy: Secondary | ICD-10-CM | POA: Diagnosis not present

## 2018-08-23 ENCOUNTER — Ambulatory Visit (INDEPENDENT_AMBULATORY_CARE_PROVIDER_SITE_OTHER): Payer: Medicare Other | Admitting: Physician Assistant

## 2018-08-23 ENCOUNTER — Encounter: Payer: Self-pay | Admitting: Physician Assistant

## 2018-08-23 VITALS — BP 122/70 | HR 78 | Temp 98.1°F | Ht 69.5 in | Wt 273.2 lb

## 2018-08-23 DIAGNOSIS — L719 Rosacea, unspecified: Secondary | ICD-10-CM | POA: Diagnosis not present

## 2018-08-23 DIAGNOSIS — R140 Abdominal distension (gaseous): Secondary | ICD-10-CM

## 2018-08-23 DIAGNOSIS — I4891 Unspecified atrial fibrillation: Secondary | ICD-10-CM | POA: Diagnosis not present

## 2018-08-23 DIAGNOSIS — R635 Abnormal weight gain: Secondary | ICD-10-CM | POA: Diagnosis not present

## 2018-08-23 DIAGNOSIS — R7309 Other abnormal glucose: Secondary | ICD-10-CM | POA: Diagnosis not present

## 2018-08-23 LAB — POCT GLYCOSYLATED HEMOGLOBIN (HGB A1C): Hemoglobin A1C: 5.5 % (ref 4.0–5.6)

## 2018-08-23 MED ORDER — APIXABAN 5 MG PO TABS: 5.0000 mg | ORAL_TABLET | Freq: Two times a day (BID) | ORAL | 0 refills | Status: DC

## 2018-08-23 MED ORDER — TEMAZEPAM 15 MG PO CAPS: ORAL_CAPSULE | ORAL | 2 refills | Status: DC

## 2018-08-23 MED ORDER — MAGNESIUM-OXIDE 400 (241.3 MG) MG PO TABS: ORAL_TABLET | ORAL | 2 refills | Status: DC

## 2018-08-23 MED ORDER — TAMSULOSIN HCL 0.4 MG PO CAPS: ORAL_CAPSULE | ORAL | 1 refills | Status: DC

## 2018-08-23 MED ORDER — DOXYCYCLINE HYCLATE 100 MG PO TABS
100.0000 mg | ORAL_TABLET | Freq: Every day | ORAL | 0 refills | Status: DC
Start: 2018-08-23 — End: 2018-10-17

## 2018-08-23 NOTE — Patient Instructions (Signed)
It was great to see you!  Resume your magnesium regimen. If your symptoms do not improve after two weeks, come back and see me. Sooner if worsens.  Let's follow-up in 3 months, sooner if you have concerns.  Take care,  Jarold MottoSamantha Nasif Bos PA-C

## 2018-08-23 NOTE — Progress Notes (Signed)
Maurice Reed is a 77 y.o. male here for a new problem.  History of Present Illness:   Chief Complaint  Patient presents with  . Follow-up    HPI   Abdominal bloating Has been taking a different dosage of Magnesium for about 1 month. No n/v/d. Does have hx constipation. Is on chronic pain medications. Denies blood in stool. No significant abd pain.  Rosacea Stable on doxycycline. Denies recent flares.  Atrial Fibrillation Maintained on Eliquis 5 mg BID. Also has established with Dr. Allyson Sabal in cardiology. Had echo on 07/13/18 which showed normal LV systolic function. Moderate LVH and moderate pulmonary HTN. Denies recent CP or SOB.  Weight Gain Has had significant issues with weight gain since moving in with son. Eating later than normal at night and has had a lot of traveling that has also caused weight gain. States that he has had at least 20 lb weight gain over summer. HgbA1c in March 2019 was 5.6. Denies polyuria and polyphagia.  Wt Readings from Last 10 Encounters:  08/23/18 273 lb 4 oz (123.9 kg)  07/09/18 270 lb 9.6 oz (122.7 kg)  07/04/18 265 lb 12.8 oz (120.6 kg)  05/29/18 265 lb 12.8 oz (120.6 kg)  05/22/18 269 lb 4 oz (122.1 kg)       Past Medical History:  Diagnosis Date  . Arthritis   . Atrial fibrillation (HCC)   . 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 GERD (gastroesophageal reflux disease)   . Kidney stones   . Obesity      Social History   Socioeconomic History  . Marital status: Married    Spouse name: Not on file  . Number of children: Not on file  . Years of education: Not on file  . Highest education level: Not on file  Occupational History  . Not on file  Social Needs  . Financial resource strain: Not on file  . Food insecurity:    Worry: Not on file    Inability: Not on file  . Transportation needs:    Medical: Not on file    Non-medical: Not on file  Tobacco Use  . Smoking status: Former Smoker    Packs/day: 3.00     Years: 25.00    Pack years: 75.00    Types: Cigarettes    Last attempt to quit: 11/28/1984    Years since quitting: 33.7  . Smokeless tobacco: Never Used  Substance and Sexual Activity  . Alcohol use: Never    Frequency: Never  . Drug use: Never  . Sexual activity: Yes  Lifestyle  . Physical activity:    Days per week: Not on file    Minutes per session: Not on file  . Stress: Not on file  Relationships  . Social connections:    Talks on phone: Not on file    Gets together: Not on file    Attends religious service: Not on file    Active member of club or organization: Not on file    Attends meetings of clubs or organizations: Not on file    Relationship status: Not on file  . Intimate partner violence:    Fear of current or ex partner: Not on file    Emotionally abused: Not on file    Physically abused: Not on file    Forced sexual activity: Not on file  Other Topics Concern  . 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 APPENDECTOMY  1963  . LITHOTRIPSY  1987  . NOSE SURGERY  2003  . TONSILLECTOMY AND ADENOIDECTOMY  1948  . UMBILICAL HERNIA REPAIR  1984    Family History  Problem Relation Age of Onset  . Arthritis Mother   . Hearing loss Mother   . Hypertension Mother   . Heart disease Mother   . Stroke Mother   . Arthritis Father   . Hearing loss Father   . Heart disease Father   . Hypertension Father   . Heart attack Father   . Kidney disease Father   . Stroke Father     Allergies  Allergen Reactions  . Other Itching    Cats: watery eyes, itching, sneezing    Current Medications:   Current Outpatient  Medications:  .  albuterol (PROVENTIL HFA) 108 (90 Base) MCG/ACT inhaler, Inhale into the lungs., Disp: , Rfl:  .  apixaban (ELIQUIS) 5 MG TABS tablet, Take 1 tablet (5 mg total) by mouth 2 (two) times daily., Disp: 180 tablet, Rfl: 0 .  atorvastatin (LIPITOR) 40 MG tablet, Take 1 tablet (40 mg total) by mouth daily., Disp: 90 tablet, Rfl: 0 .  Atropine Sulfate-NaCl 0.01-0.9 % SOLN, Apply to eye., Disp: , Rfl:  .  Cholecalciferol (VITAMIN D3) 3000 units TABS, Take by mouth., Disp: , Rfl:  .  clobetasol (TEMOVATE) 0.05 % external solution, APPLY TO AFFECTED AREAS ON SCALP ONE TO TWO TIMES D UTD, Disp: , Rfl: 11 .  docusate sodium (COLACE) 100 MG capsule, Take by mouth., Disp: , Rfl:  .  doxycycline (VIBRA-TABS) 100 MG tablet, Take 1 tablet (100 mg total) by mouth daily., Disp: 90 tablet, Rfl: 0 .  erythromycin ophthalmic ointment, APP THIN LAYER IN OS BID, Disp: , Rfl: 1 .  fluticasone (FLONASE) 50 MCG/ACT nasal spray, Place into the nose., Disp: , Rfl:  .  loratadine (CLARITIN) 10 MG tablet, Take 10 mg by mouth daily., Disp: , Rfl: 0 .  MAGNESIUM-OXIDE 400 (241.3 Mg) MG tablet, TK 1 T PO BID, Disp: 60 tablet, Rfl: 2 .  metoprolol succinate (TOPROL-XL) 25 MG 24 hr tablet, TK 1 T PO D, Disp: , Rfl: 1 .  Naloxone HCl (EVZIO) 2 MG/0.4ML SOAJ, by Injection route as directed., Disp: , Rfl:  .  oxyCODONE (OXY IR/ROXICODONE) 5 MG immediate release tablet, Take 5 mg by mouth 2 (two) times daily as needed. for pain, Disp: , Rfl: 0 .  potassium chloride (MICRO-K) 10 MEQ CR capsule, TK 1 C PO BID, Disp: , Rfl: 1 .  prednisoLONE acetate (PRED FORTE) 1 % ophthalmic suspension, SHAKE LQ AND INT 1 GTT IN OS QD PRN, Disp: , Rfl: 1 .  tadalafil (CIALIS) 10 MG tablet, Take 1 tablet (10 mg total) by mouth daily as needed for erectile dysfunction., Disp: 10 tablet, Rfl: 0 .  Tafluprost (ZIOPTAN) 0.0015 % SOLN, Apply to eye., Disp: , Rfl:  .  tamsulosin (FLOMAX) 0.4 MG CAPS capsule, TK 1 C PO HS, Disp: 90 capsule,  Rfl: 1 .  temazepam (RESTORIL) 15 MG capsule, TAKE ONE CAPSULE BY MOUTH  EVERY NIGHT AT BEDTIME AS NEEDED FOR SLEEP, Disp: 30 capsule, Rfl: 2 .  torsemide (DEMADEX) 20 MG tablet, TK 1 T PO BID, Disp: , Rfl: 0   Review of Systems:   ROS  Negative unless otherwise specified per HPI.   Vitals:   Vitals:   08/23/18 1016  BP: 122/70  Pulse: 78  Temp: 98.1 F (36.7 C)  TempSrc: Oral  SpO2: 98%  Weight: 273 lb 4 oz (123.9 kg)  Height: 5' 9.5" (1.765 m)     Body mass index is 39.77 kg/m.  Physical Exam:   Physical Exam  Constitutional: He appears well-developed. He is cooperative.  Non-toxic appearance. He does not have a sickly appearance. He does not appear ill. No distress.  Cardiovascular: Normal rate, S1 normal, S2 normal, normal heart sounds and normal pulses. An irregularly irregular rhythm present.  Trace LE edema  Pulmonary/Chest: Effort normal and breath sounds normal.  Abdominal: Normal appearance and bowel sounds are normal. There is no tenderness.  Neurological: He is alert. GCS eye subscore is 4. GCS verbal subscore is 5. GCS motor subscore is 6.  Skin: Skin is warm, dry and intact.  Psychiatric: He has a normal mood and affect. His speech is normal and behavior is normal.  Nursing note and vitals reviewed.  Results for orders placed or performed in visit on 08/23/18  POCT HgB A1C  Result Value Ref Range   Hemoglobin A1C 5.5 4.0 - 5.6 %     Assessment and Plan:    Daylen was seen today for follow-up.  Diagnoses and all orders for this visit:  Elevated glucose; Weight gain Discussed need for decreased portions. States that he has cut down his CHO intake. Continue to work on this. HgbA1c normal. -     POCT HgB A1C  Bloating Suspect some underlying constipation. Resume prior Magnesium Oxide regimen - I refilled this today. Follow-up if symptoms worsen or persist.   Rosacea Continue doxycycline. Stable.  Atrial fibrillation, unspecified type (HCC) Rate  controlled. Needs refill on Eliquis today. I have refilled this. Last renal labs on 05/22/18 normal. Follow-up with cardiologist per their recommendations.  Other orders -     temazepam (RESTORIL) 15 MG capsule; TAKE ONE CAPSULE BY MOUTH EVERY NIGHT AT BEDTIME AS NEEDED FOR SLEEP -     tamsulosin (FLOMAX) 0.4 MG CAPS capsule; TK 1 C PO HS -     MAGNESIUM-OXIDE 400 (241.3 Mg) MG tablet; TK 1 T PO BID -     doxycycline (VIBRA-TABS) 100 MG tablet; Take 1 tablet (100 mg total) by mouth daily. -     apixaban (ELIQUIS) 5 MG TABS tablet; Take 1 tablet (5 mg total) by mouth 2 (two) times daily.    . Reviewed expectations re: course of current medical issues. . Discussed self-management of symptoms. . Outlined signs and symptoms indicating need for more acute intervention. . Patient verbalized understanding and all questions were answered. . See orders for this visit as documented in the electronic medical record. . Patient received an After-Visit Summary.  Jarold Motto, PA-C

## 2018-09-04 ENCOUNTER — Telehealth: Payer: Self-pay | Admitting: *Deleted

## 2018-09-04 DIAGNOSIS — L719 Rosacea, unspecified: Secondary | ICD-10-CM

## 2018-09-04 NOTE — Telephone Encounter (Signed)
Copied from CRM [id number]. Topic: Referral - Request >> Sep 04, 2018  4:07 PM Arlyss Gandy, NT wrote: Reason for CRM: Pts wife requesting a referral to a dermatologist for psoriasis.

## 2018-09-05 NOTE — Addendum Note (Signed)
Addended by: Jimmye Norman on: 09/05/2018 01:35 PM   Modules accepted: Orders

## 2018-09-05 NOTE — Telephone Encounter (Signed)
Spoke to pt told him order put in for referral to Dermatology and someone will be contacting you about an appointment. Pt verbalized understanding.

## 2018-09-18 ENCOUNTER — Telehealth: Payer: Self-pay | Admitting: Pulmonary Disease

## 2018-09-18 ENCOUNTER — Telehealth: Payer: Self-pay | Admitting: *Deleted

## 2018-09-18 DIAGNOSIS — G4733 Obstructive sleep apnea (adult) (pediatric): Secondary | ICD-10-CM

## 2018-09-18 NOTE — Telephone Encounter (Signed)
Thank you I will request a download and work with Leotis Shames to achieve that.  We will have Lauren follow-up with the patient and DME company.  Elisha Headland FNP

## 2018-09-18 NOTE — Telephone Encounter (Signed)
Called [place] office for download per Cynda Familia and patient was just set up on bipap machine on 09/14/2018.   Will route to [place] as Lorain Childes

## 2018-09-18 NOTE — Telephone Encounter (Signed)
Thank you for following up.  Patient is reporting he feels the BiPAP pressures are too high.  Patient was just recently set up on BiPAP on 09/14/2018 so unable to receive download per [facility].  We could drop pressure to 14/8 and see how patient does with those changes. Can you place the order to [facility] and notify the patient?  Patient to keep follow-up with Dr. Vassie Loll.  Patient needs to continue to adhere to using BiPAP.    Pt to follow-up with [place] in a week to let us know how that pressure changes doing.  Elisha Headland FNP

## 2018-09-18 NOTE — Telephone Encounter (Signed)
Called [facility] asking for patient to be enrolled into [place]. Patient does not have a modem with his machine. [facility] is going to call patient and fax over a download when patient brings in chip.    wll route to self to keep open until download received

## 2018-09-18 NOTE — Telephone Encounter (Signed)
Information from pt's last OV with Elisha Headland, NP: Will place order for new BiPAP with [facility] >>>BiPAP settings 18/12 >>> 6-week download to ensure compliance as well as treatment  Called and spoke with pt to see if he believed the pressure settings that were stated at last OV were too much for him or not enough.  Per pt, he believes the settings are too much pressure.  Arlys John, please advise on this for pt. Thanks!

## 2018-09-19 NOTE — Telephone Encounter (Signed)
Called spoke with patient to let him know we were sending the order for pressure changes. Also to follow up with [place] in a week to let us know if the new pressure is better.  Order placed for pressure changes  Nothing further needed

## 2018-09-20 ENCOUNTER — Other Ambulatory Visit: Payer: Self-pay | Admitting: Physician Assistant

## 2018-09-21 ENCOUNTER — Ambulatory Visit (INDEPENDENT_AMBULATORY_CARE_PROVIDER_SITE_OTHER): Payer: Medicare Other | Admitting: Surgical

## 2018-09-21 ENCOUNTER — Encounter: Payer: Self-pay | Admitting: Physician Assistant

## 2018-09-21 DIAGNOSIS — G894 Chronic pain syndrome: Secondary | ICD-10-CM | POA: Diagnosis not present

## 2018-09-21 DIAGNOSIS — Z23 Encounter for immunization: Secondary | ICD-10-CM | POA: Diagnosis not present

## 2018-09-21 DIAGNOSIS — M47816 Spondylosis without myelopathy or radiculopathy, lumbar region: Secondary | ICD-10-CM | POA: Diagnosis not present

## 2018-09-21 DIAGNOSIS — Z79899 Other long term (current) drug therapy: Secondary | ICD-10-CM | POA: Diagnosis not present

## 2018-10-01 ENCOUNTER — Telehealth: Payer: Self-pay | Admitting: Physician Assistant

## 2018-10-01 NOTE — Telephone Encounter (Signed)
MEDICATION:  Potassium CL Er capsules Tamsulosin .4 mg capsules Metoprolol er succinate 25 mg   PHARMACY:   [id number] - [place], [place] - [address] AT [facility] [address] [phone] ([phone] (Fax)     IS THIS A 90 DAY SUPPLY : y  IS PATIENT OUT OF MEDICATION: n  IF NOT; HOW MUCH IS LEFT: week  LAST APPOINTMENT DATE: @10 /18/2019  NEXT APPOINTMENT DATE:@12 /19/2019  OTHER COMMENTS:    **Let patient know to contact pharmacy at the end of the day to make sure medication is ready. **  ** Please notify patient to allow 48-72 hours to process**  **Encourage patient to contact the pharmacy for refills or they can request refills through [facility]**

## 2018-10-03 MED ORDER — METOPROLOL SUCCINATE ER 25 MG PO TB24: ORAL_TABLET | ORAL | 0 refills | Status: DC

## 2018-10-03 MED ORDER — POTASSIUM CHLORIDE ER 10 MEQ PO CPCR: ORAL_CAPSULE | ORAL | 0 refills | Status: DC

## 2018-10-03 NOTE — Addendum Note (Signed)
Addended by: Zenovia Jordan B on: 10/03/2018 10:07 AM   Modules accepted: Orders

## 2018-10-03 NOTE — Telephone Encounter (Signed)
Tamsulosin was filled 08/23/18 #90 with 1 refills

## 2018-10-15 DIAGNOSIS — L719 Rosacea, unspecified: Secondary | ICD-10-CM | POA: Diagnosis not present

## 2018-10-15 DIAGNOSIS — L400 Psoriasis vulgaris: Secondary | ICD-10-CM | POA: Diagnosis not present

## 2018-10-17 ENCOUNTER — Encounter: Payer: Self-pay | Admitting: Pulmonary Disease

## 2018-10-17 ENCOUNTER — Ambulatory Visit (INDEPENDENT_AMBULATORY_CARE_PROVIDER_SITE_OTHER): Payer: Medicare Other | Admitting: Pulmonary Disease

## 2018-10-17 DIAGNOSIS — G4733 Obstructive sleep apnea (adult) (pediatric): Secondary | ICD-10-CM | POA: Diagnosis not present

## 2018-10-17 DIAGNOSIS — J929 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: Secondary | ICD-10-CM | POA: Diagnosis not present

## 2018-10-17 NOTE — Assessment & Plan Note (Signed)
No convincing history of asbestos exposure.  This may be related to her pneumonia. We will repeat imaging in the future to ensure no progression

## 2018-10-17 NOTE — Progress Notes (Signed)
   Subjective:    Patient ID: Maurice Reed, male    DOB: 09/13/1941, 77 y.o.   MRN: [id number]  HPI  77 year old remote smoker for  FU of COPD, OSA  He has pleural plaques consistent with asbestosis but no convincing history of asbestos exposure He smoked about 2 packs/day before he quit in his 2040s, more than 50 pack years.  PMH -  DVT/PE in 2002 , chr atrial fibrillation and is maintained on Eliquis, his last flare of CHF was in 2015.  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  We got him a new BiPAP was initially set at 18/12 but this pressure was too high for him.  We decreased to 14/8 and this seems to be more comfortable.  She is settled on with nasal pillows or nasal mask. Download was reviewed which shows mild leak, excellent control of events on 14/8 and great compliance.  He has an oxygen concentrator which he obtained many years ago.  Still uses it on occasion.  On his last visit he did not seem to require oxygen on ambulation.  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 Features of asbestosis are NOT  identified.   01/2018 titration >> bipap 18/12   11/2017 spiro ratio 86, FEV 1 45%, FVC 39 % , severe restriction ABG 12/2017 7.42/45/82/ 97% RA   Review of Systems neg for any significant sore throat, dysphagia, itching, sneezing, nasal congestion or excess/ purulent secretions, fever, chills, sweats, unintended wt loss, pleuritic or exertional cp, hempoptysis, orthopnea pnd or change in chronic leg swelling. Also denies presyncope, palpitations, heartburn, abdominal pain, nausea, vomiting, diarrhea or change in bowel or urinary habits, dysuria,hematuria, rash, arthralgias, visual complaints,  headache, numbness weakness or ataxia.     Objective:   Physical Exam  Gen. Pleasant, obese, in no distress ENT - no lesions, no post nasal drip Neck: No JVD, no thyromegaly, no carotid bruits Lungs: no use of accessory muscles, no dullness to percussion, decreased without rales or rhonchi  Cardiovascular: Rhythm regular, heart sounds  normal, no murmurs or gallops, no peripheral edema Musculoskeletal: No deformities, no cyanosis or clubbing , no tremors       Assessment & Plan:

## 2018-10-17 NOTE — Patient Instructions (Signed)
BiPAP seems to be working well. Okay to stay off oxygen for longer durations

## 2018-10-17 NOTE — Assessment & Plan Note (Signed)
BiPAP 14/8 seems to be working well, he is compliant. He has a mild leak which may be related to mouth leak and should be controlled with a chinstrap  Weight loss encouraged, compliance with goal of at least 4-6 hrs every night is the expectation. Advised against medications with sedative side effects Cautioned against driving when sleepy - understanding that sleepiness will vary on a day to day basis

## 2018-10-22 DIAGNOSIS — Z79899 Other long term (current) drug therapy: Secondary | ICD-10-CM | POA: Diagnosis not present

## 2018-10-22 DIAGNOSIS — M539 Dorsopathy, unspecified: Secondary | ICD-10-CM | POA: Diagnosis not present

## 2018-10-22 DIAGNOSIS — G894 Chronic pain syndrome: Secondary | ICD-10-CM | POA: Diagnosis not present

## 2018-11-18 ENCOUNTER — Other Ambulatory Visit: Payer: Self-pay | Admitting: Physician Assistant

## 2018-11-19 DIAGNOSIS — L400 Psoriasis vulgaris: Secondary | ICD-10-CM | POA: Diagnosis not present

## 2018-11-19 NOTE — Telephone Encounter (Signed)
Okay to refill requested medications?

## 2018-11-22 ENCOUNTER — Encounter: Payer: Self-pay | Admitting: Physician Assistant

## 2018-11-22 ENCOUNTER — Ambulatory Visit (INDEPENDENT_AMBULATORY_CARE_PROVIDER_SITE_OTHER): Payer: Medicare Other | Admitting: Physician Assistant

## 2018-11-22 VITALS — BP 130/72 | HR 96 | Temp 97.7°F | Ht 69.5 in | Wt 256.0 lb

## 2018-11-22 DIAGNOSIS — J069 Acute upper respiratory infection, unspecified: Secondary | ICD-10-CM | POA: Diagnosis not present

## 2018-11-22 DIAGNOSIS — I10 Essential (primary) hypertension: Secondary | ICD-10-CM

## 2018-11-22 LAB — BASIC METABOLIC PANEL
BUN: 16 mg/dL (ref 6–23)
CALCIUM: 9.6 mg/dL (ref 8.4–10.5)
CO2: 32 meq/L (ref 19–32)
Chloride: 99 mEq/L (ref 96–112)
Creatinine, Ser: 0.79 mg/dL (ref 0.40–1.50)
GFR: 100.88 mL/min (ref >60.00)
GLUCOSE: 88 mg/dL (ref 70–99)
Potassium: 4.3 mEq/L (ref 3.5–5.1)
SODIUM: 140 meq/L (ref 135–145)

## 2018-11-22 MED ORDER — MOMETASONE FUROATE 50 MCG/ACT NA SUSP: 2.0000 | Freq: Every day | NASAL | 2 refills | Status: DC

## 2018-11-22 NOTE — Patient Instructions (Signed)
It was great to see you!  Congrats on the weight loss!!  Let's follow-up in 3 months, sooner if you have concerns.  Take care,  Jarold MottoSamantha Per Beagley PA-C

## 2018-11-22 NOTE — Progress Notes (Signed)
Maurice Reed is a 77 y.o. male is here to follow up on Hypertension  I acted as a Education administrator for [employer], PA-C Anselmo Pickler, LPN  History of Present Illness:   Chief Complaint  Patient presents with  . Hypertension   Wt Readings from Last 5 Encounters:  11/22/18 256 lb (116.1 kg)  10/17/18 270 lb 6.4 oz (122.7 kg)  08/23/18 273 lb 4 oz (123.9 kg)  07/09/18 270 lb 9.6 oz (122.7 kg)  07/04/18 265 lb 12.8 oz (120.6 kg)     Hypertension  This is a chronic problem. Episode onset: Pt here for 3 month follow up on Blood pressure. Pt checking blood pressure daily BP averaging135/83. The problem is unchanged. The problem is controlled. Pertinent negatives include no chest pain, headaches, palpitations, peripheral edema or shortness of breath. Risk factors for coronary artery disease include obesity. There are no compliance problems.  Identifiable causes of hypertension include sleep apnea.    Patient with URI symptoms as well. Has been having sneezing and itchy throat. No fever. No productive cough. Has tried Coricidin and Claritin but is now not taking anything.   There are no preventive care reminders to display for this patient.  Past Medical History:  Diagnosis Date  . Arthritis   . Atrial fibrillation ([place])   . 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 GERD (gastroesophageal reflux disease)   . Kidney stones   . Obesity      Social History   Socioeconomic History  . Marital status: Married    Spouse name: Not on file  . Number of children: Not on file  . Years of education: Not on file  . Highest education level: Not on file  Occupational History  . Not on file  Social Needs  . Financial resource strain: Not on file  . Food insecurity:    Worry: Not on file    Inability: Not on file  . Transportation needs:    Medical: Not on file    Non-medical: Not on file  Tobacco Use  . Smoking status: Former Smoker    Packs/day: 3.00    Years:  25.00    Pack years: 75.00    Types: Cigarettes    Last attempt to quit: 11/28/1984    Years since quitting: 34.0  . Smokeless tobacco: Never Used  Substance and Sexual Activity  . Alcohol use: Never    Frequency: Never  . Drug use: Never  . Sexual activity: Yes  Lifestyle  . Physical activity:    Days per week: Not on file    Minutes per session: Not on file  . Stress: Not on file  Relationships  . Social connections:    Talks on phone: Not on file    Gets together: Not on file    Attends religious service: Not on file    Active member of club or organization: Not on file    Attends meetings of clubs or organizations: Not on file    Relationship status: Not on file  . Intimate partner violence:    Fear of current or ex partner: Not on file    Emotionally abused: Not on file    Physically abused: Not on file    Forced sexual activity: Not on file  Other Topics Concern  . Not on file  Social History Narrative   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47)  Married to [employer] ([name] patient)   7 children   Currently lives with son in [place], [place]; all their things are in storage; plan to travel and stay with kids but keep their home base in the [place] area    Past Surgical History:  Procedure Laterality Date  . APPENDECTOMY  1963  . LITHOTRIPSY  1987  . NOSE SURGERY  2003  . TONSILLECTOMY AND ADENOIDECTOMY  1948  . UMBILICAL HERNIA REPAIR  1984    Family History  Problem Relation Age of Onset  . Arthritis Mother   . Hearing loss Mother   . Hypertension Mother   . Heart disease Mother   . Stroke Mother   . Arthritis Father   . Hearing loss Father   . Heart disease Father   . Hypertension Father   . Heart attack Father   . Kidney disease Father   . Stroke Father     PMHx, SurgHx, SocialHx, FamHx, Medications, and Allergies were reviewed in the Visit Navigator and updated as appropriate.   Patient Active Problem  List   Diagnosis Date Noted  . 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 Bilateral lower extremity edema 07/04/2018  . Sclerosis of the skin 06/26/2018  . Pulmonary emphysema ([place]) 05/22/2018  . Insomnia 05/22/2018  . Erectile dysfunction 05/22/2018  . Obesity   . Kidney stones   . Essential hypertension   . GERD (gastroesophageal reflux disease)   . Former smoker   . Arthritis   . 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 OSA treated with BiPAP 06/17/2013  . 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 Atrial fibrillation ([place]) 06/17/2013  . Rosacea 06/17/2013  . 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 Pulmonary embolism ([place]) 12/05/1998    Social History   Tobacco Use  . Smoking status: Former Smoker    Packs/day: 3.00    Years: 25.00    Pack years: 75.00    Types: Cigarettes    Last attempt to quit: 11/28/1984    Years since quitting: 34.0  . Smokeless tobacco: Never Used  Substance Use Topics  . Alcohol use: Never    Frequency: Never  . Drug use: Never    Current Medications and Allergies:    Current Outpatient Medications:  .  albuterol (PROVENTIL HFA) 108 (90 Base) MCG/ACT inhaler, Inhale into the lungs., Disp: , Rfl:  .  atorvastatin (LIPITOR) 40 MG tablet, Take 1 tablet (40 mg total) by mouth daily., Disp: 90 tablet, Rfl: 0 .  Atropine Sulfate-NaCl 0.01-0.9 % SOLN, Apply to eye., Disp: , Rfl:  .  Cholecalciferol (VITAMIN D3) 3000 units TABS, Take by mouth., Disp: , Rfl:  .  clobetasol (TEMOVATE) 0.05 % external solution, APPLY TO AFFECTED AREAS ON SCALP ONE TO TWO TIMES D UTD, Disp: , Rfl: 11 .  docusate sodium (COLACE) 100 MG capsule, Take by mouth., Disp: , Rfl:  .  doxycycline (VIBRA-TABS) 100 MG tablet, TAKE 1 TABLET(100 MG) BY MOUTH DAILY, Disp: 90 tablet, Rfl: 0 .  ELIQUIS 5 MG TABS tablet, TAKE 1 TABLET(5 MG) BY MOUTH TWICE DAILY, Disp: 180 tablet, Rfl: 0 .  erythromycin ophthalmic ointment, APP THIN LAYER IN  OS BID, Disp: , Rfl: 1 .  fluticasone (FLONASE) 50 MCG/ACT nasal spray, Place into the nose., Disp: , Rfl:  .  loratadine (CLARITIN) 10 MG tablet, Take 10 mg by mouth daily., Disp: , Rfl: 0 .  magnesium oxide (MAGNESIUM-OXIDE) 400 (241.3 Mg) MG tablet, TAKE  1 TABLET BY MOUTH TWICE DAILY, Disp: 60 tablet, Rfl: 2 .  metoprolol succinate (TOPROL-XL) 25 MG 24 hr tablet, Take 1 tablet by mouth daily, Disp: 90 tablet, Rfl: 0 .  Naloxone HCl (EVZIO) 2 MG/0.4ML SOAJ, by Injection route as directed., Disp: , Rfl:  .  oxyCODONE (OXY IR/ROXICODONE) 5 MG immediate release tablet, Take 5 mg by mouth 2 (two) times daily as needed. for pain, Disp: , Rfl: 0 .  potassium chloride (MICRO-K) 10 MEQ CR capsule, Take 1 capsule by mouth twice daily, Disp: 180 capsule, Rfl: 0 .  prednisoLONE acetate (PRED FORTE) 1 % ophthalmic suspension, SHAKE LQ AND INT 1 GTT IN OS QD PRN, Disp: , Rfl: 1 .  tadalafil (CIALIS) 10 MG tablet, Take 1 tablet (10 mg total) by mouth daily as needed for erectile dysfunction., Disp: 10 tablet, Rfl: 0 .  Tafluprost (ZIOPTAN) 0.0015 % SOLN, Apply to eye., Disp: , Rfl:  .  tamsulosin (FLOMAX) 0.4 MG CAPS capsule, TK 1 C PO HS, Disp: 90 capsule, Rfl: 1 .  temazepam (RESTORIL) 15 MG capsule, TAKE ONE CAPSULE BY MOUTH EVERY NIGHT AT BEDTIME AS NEEDED FOR SLEEP, Disp: 30 capsule, Rfl: 2 .  torsemide (DEMADEX) 20 MG tablet, TAKE 1 TABLET BY MOUTH TWICE DAILY, Disp: 180 tablet, Rfl: 0 .  triamcinolone cream (KENALOG) 0.1 %, APPLY AA BID, Disp: , Rfl: 0 .  mometasone (NASONEX) 50 MCG/ACT nasal spray, Place 2 sprays into the nose daily., Disp: 17 g, Rfl: 2 .  NARCAN 4 MG/0.1ML LIQD nasal spray kit, INSTILL ONE SPRAY PRN FOR ACCIDENTAL OVERDOSE, Disp: , Rfl: 0   Allergies  Allergen Reactions  . Other Itching    Cats: watery eyes, itching, sneezing    Review of Systems   Review of Systems  Respiratory: Negative for shortness of breath.   Cardiovascular: Negative for chest pain and palpitations.    Neurological: Negative for headaches.    Vitals:   Vitals:   11/22/18 1119  BP: 130/72  Pulse: 96  Temp: 97.7 F (36.5 C)  TempSrc: Oral  Weight: 256 lb (116.1 kg)  Height: 5' 9.5" (1.765 m)     Body mass index is 37.26 kg/m.   Physical Exam:    Physical Exam Vitals signs and nursing note reviewed.  Constitutional:      General: He is not in acute distress.    Appearance: He is well-developed. He is not ill-appearing or toxic-appearing.  HENT:     Head: Normocephalic and atraumatic.     Right Ear: Tympanic membrane, ear canal and external ear normal. Tympanic membrane is not erythematous, retracted or bulging.     Left Ear: Tympanic membrane, ear canal and external ear normal. Tympanic membrane is not erythematous, retracted or bulging.     Nose: Nose normal.     Right Sinus: No maxillary sinus tenderness or frontal sinus tenderness.     Left Sinus: No maxillary sinus tenderness or frontal sinus tenderness.     Mouth/Throat:     Pharynx: Uvula midline. No posterior oropharyngeal erythema.  Eyes:     General: Lids are normal.     Conjunctiva/sclera: Conjunctivae normal.  Neck:     Trachea: Trachea normal.  Cardiovascular:     Rate and Rhythm: Normal rate and regular rhythm.     Heart sounds: Normal heart sounds, S1 normal and S2 normal.     Comments: Trace b/l lower extremity edema Pulmonary:     Effort: Pulmonary effort is normal.  Breath sounds: Normal breath sounds. No decreased breath sounds, wheezing, rhonchi or rales.  Lymphadenopathy:     Cervical: No cervical adenopathy.  Skin:    General: Skin is warm and dry.  Neurological:     Mental Status: He is alert.  Psychiatric:        Speech: Speech normal.        Behavior: Behavior normal. Behavior is cooperative.      Assessment and Plan:    Jenaro was seen today for hypertension.  Diagnoses and all orders for this visit:  Essential hypertension Well controlled. Continue current regimen.  Re-check BMP today. -     Basic metabolic panel  Upper respiratory tract infection, unspecified type No red flags on exam.  He is requesting Nasonex. Currently using flonase but would like Nasonex. I discussed that this is not necessarily safe with the hx of his eye illnesses, however he states that he accepts this risk and still would like to trial it. Discussed taking medications as prescribed. Reviewed return precautions including worsening fever, SOB, worsening cough or other concerns. Push fluids and rest. I recommend that patient follow-up if symptoms worsen or persist despite treatment x 7-10 days, sooner if needed.  Other orders -     mometasone (NASONEX) 50 MCG/ACT nasal spray; Place 2 sprays into the nose daily.    . Reviewed expectations re: course of current medical issues. . Discussed self-management of symptoms. . Outlined signs and symptoms indicating need for more acute intervention. . Patient verbalized understanding and all questions were answered. . See orders for this visit as documented in the electronic medical record. . Patient received an After Visit Summary.  CMA or LPN served as scribe during this visit. History, Physical, and Plan performed by medical provider. The above documentation has been reviewed and is accurate and complete.   Inda Coke, PA-C [place], [place] 11/22/2018  Follow-up: No follow-ups on file.

## 2018-11-30 DIAGNOSIS — Z79899 Other long term (current) drug therapy: Secondary | ICD-10-CM | POA: Diagnosis not present

## 2018-11-30 DIAGNOSIS — G894 Chronic pain syndrome: Secondary | ICD-10-CM | POA: Diagnosis not present

## 2018-11-30 DIAGNOSIS — M539 Dorsopathy, unspecified: Secondary | ICD-10-CM | POA: Diagnosis not present

## 2018-12-07 ENCOUNTER — Other Ambulatory Visit: Payer: Self-pay | Admitting: Physician Assistant

## 2018-12-12 ENCOUNTER — Other Ambulatory Visit: Payer: Self-pay | Admitting: *Deleted

## 2018-12-12 MED ORDER — TEMAZEPAM 15 MG PO CAPS: ORAL_CAPSULE | ORAL | 1 refills | Status: DC

## 2018-12-12 MED ORDER — POTASSIUM CHLORIDE ER 10 MEQ PO CPCR: ORAL_CAPSULE | ORAL | 3 refills | Status: DC

## 2018-12-12 MED ORDER — TORSEMIDE 20 MG PO TABS: 20.0000 mg | ORAL_TABLET | Freq: Two times a day (BID) | ORAL | 3 refills | Status: DC

## 2018-12-12 MED ORDER — MAGNESIUM OXIDE 400 (241.3 MG) MG PO TABS: ORAL_TABLET | ORAL | 3 refills | Status: DC

## 2018-12-12 MED ORDER — CLOBETASOL PROPIONATE 0.05 % EX SOLN: CUTANEOUS | 4 refills | Status: DC

## 2018-12-12 MED ORDER — APIXABAN 5 MG PO TABS: ORAL_TABLET | ORAL | 1 refills | Status: DC

## 2018-12-12 NOTE — Telephone Encounter (Signed)
[name] can you please send Temazepam to [facility]. I have sent everything else that was requested.

## 2018-12-13 ENCOUNTER — Telehealth: Payer: Self-pay | Admitting: Physician Assistant

## 2018-12-13 DIAGNOSIS — R2689 Other abnormalities of gait and mobility: Secondary | ICD-10-CM

## 2018-12-13 DIAGNOSIS — H9193 Unspecified hearing loss, bilateral: Secondary | ICD-10-CM

## 2018-12-13 NOTE — Telephone Encounter (Signed)
Copied from CRM [id number]. Topic: Referral - Request for Referral >> Dec 13, 2018  4:48 PM Jolayne Haines L wrote: Has patient seen PCP for this complaint? She is unsure *If NO, is insurance requiring patient see PCP for this issue before PCP can refer them? N/A Referral for which specialty: ENT Preferred provider/office: Anyone that [name] recommends (please call if he needs appt) Reason for referral: Lack of hearing and some of his balance is off because of inner ear ( for a long time )

## 2018-12-14 ENCOUNTER — Other Ambulatory Visit: Payer: Self-pay | Admitting: Physician Assistant

## 2018-12-14 DIAGNOSIS — G629 Polyneuropathy, unspecified: Secondary | ICD-10-CM

## 2018-12-14 MED ORDER — METOPROLOL SUCCINATE ER 25 MG PO TB24: ORAL_TABLET | ORAL | 0 refills | Status: DC

## 2018-12-14 MED ORDER — TAMSULOSIN HCL 0.4 MG PO CAPS: ORAL_CAPSULE | ORAL | 1 refills | Status: DC

## 2018-12-14 NOTE — Telephone Encounter (Signed)
Spoke to pt's wife [name], told her referral for ENT done and someone will contact him to schedule an appointment. [name] verbalized understanding.

## 2018-12-14 NOTE — Addendum Note (Signed)
Addended by: Phillips Odor on: 12/14/2018 01:24 PM   Modules accepted: Orders

## 2018-12-14 NOTE — Telephone Encounter (Signed)
Pt's wife calling states that new mail order pharmacy has all medication needed but  tamsulosin (FLOMAX) 0.4 MG CAPS capsule metoprolol succinate (TOPROL-XL) 25 MG 24 hr tablet  Medications are on hold at Mail order until these get called in so everything can go out together.

## 2019-01-02 ENCOUNTER — Other Ambulatory Visit: Payer: Self-pay | Admitting: Physician Assistant

## 2019-01-03 ENCOUNTER — Other Ambulatory Visit: Payer: Self-pay | Admitting: Physician Assistant

## 2019-01-08 ENCOUNTER — Other Ambulatory Visit: Payer: Self-pay | Admitting: Physician Assistant

## 2019-01-09 NOTE — Telephone Encounter (Signed)
Pt is requesting a refill on Temazepam.

## 2019-01-10 ENCOUNTER — Other Ambulatory Visit: Payer: Self-pay | Admitting: *Deleted

## 2019-01-10 MED ORDER — MAGNESIUM OXIDE 400 (241.3 MG) MG PO TABS: ORAL_TABLET | ORAL | 3 refills | Status: DC

## 2019-01-15 ENCOUNTER — Ambulatory Visit: Payer: Self-pay

## 2019-01-15 NOTE — Telephone Encounter (Signed)
See note

## 2019-01-15 NOTE — Telephone Encounter (Signed)
Patient's wife called in and says "my husband has been dizzy off and on for a couple of weeks, but today is the worst. His balance is off when he stands and he has to hold on to something or me to walk. He says the room is not spinning, but it's worse with any head movement and standing." I asked about other symptoms, she says "well, he's been using his oxygen a lot more lately and gets out of breath when moving about. He says he's not nauseated, no vomiting, no diarrhea, no bleeding and no chest pain." According to protocol, see PCP within 24 hours, no availability with PCP or any provider in the practice. I offered another [place] location or she could take him to the UC. She asked him and he says another [place] location is ok, but to check to see if he can be worked in at [facility]. I called and spoke to [place], [facility] who says she will pass this information to the nurse, who will call back with the decision about working him in. I advised the wife, she verbalized understanding. Care advice given, wife verbalized understanding.  Reason for Disposition . [1] MODERATE dizziness (e.g., interferes with normal activities) AND [2] has NOT been evaluated by physician for this  (Exception: dizziness caused by heat exposure, sudden standing, or poor fluid intake)  Answer Assessment - Initial Assessment Questions 1. DESCRIPTION: "Describe your dizziness."     Off balance 2. LIGHTHEADED: "Do you feel lightheaded?" (e.g., somewhat faint, woozy, weak upon standing)     Yes 3. VERTIGO: "Do you feel like either you or the room is spinning or tilting?" (i.e. vertigo)     No 4. SEVERITY: "How bad is it?"  "Do you feel like you are going to faint?" "Can you stand and walk?"   - MILD - walking normally   - MODERATE - interferes with normal activities (e.g., work, school)    - SEVERE - unable to stand, requires support to walk, feels like passing out now.      Moderate 5. ONSET:  "When did the dizziness begin?"    Off and on for past couple of weeks, but worse today 6. AGGRAVATING FACTORS: "Does anything make it worse?" (e.g., standing, change in head position)     Standing, change head position 7. HEART RATE: "Can you tell me your heart rate?" "How many beats in 15 seconds?"  (Note: not all patients can do this)       N/A 8. CAUSE: "What do you think is causing the dizziness?"     Possible inner ear, I really don't know 9. RECURRENT SYMPTOM: "Have you had dizziness before?" If so, ask: "When was the last time?" "What happened that time?"     Yes; inner ear before, resolved itself 10. OTHER SYMPTOMS: "Do you have any other symptoms?" (e.g., fever, chest pain, vomiting, diarrhea, bleeding)       No 11. PREGNANCY: "Is there any chance you are pregnant?" "When was your last menstrual period?"       N/A  Protocols used: DIZZINESS [facility]

## 2019-01-15 NOTE — Telephone Encounter (Signed)
Patient called and advised that Joellen, CMA says that there is no way to work him in today and that he can go to another [facility] location. He says that he will go to an UC.

## 2019-01-16 NOTE — Telephone Encounter (Signed)
Spoke to pt asked him if he went to Urgent Care yesterday? Pt said yes in [place]. Pt said he was Dx with Inner Ear problem was given antibiotic, Meclizine and Kenalog injection. Told pt okay just wanted to make sure you were seen. Please call if you need anything. Pt verbalized understanding.

## 2019-01-22 ENCOUNTER — Encounter: Payer: Self-pay | Admitting: Physician Assistant

## 2019-01-22 ENCOUNTER — Emergency Department (HOSPITAL_COMMUNITY)
Admission: EM | Admit: 2019-01-22 | Discharge: 2019-01-22 | Disposition: A | Discharge status: Home / Self Care | Payer: Medicare Other | Attending: Emergency Medicine | Admitting: Emergency Medicine

## 2019-01-22 ENCOUNTER — Emergency Department (HOSPITAL_COMMUNITY): Payer: Medicare Other

## 2019-01-22 ENCOUNTER — Encounter (HOSPITAL_COMMUNITY): Payer: Self-pay

## 2019-01-22 ENCOUNTER — Other Ambulatory Visit: Payer: Self-pay

## 2019-01-22 ENCOUNTER — Ambulatory Visit (INDEPENDENT_AMBULATORY_CARE_PROVIDER_SITE_OTHER): Payer: Medicare Other | Admitting: Physician Assistant

## 2019-01-22 VITALS — BP 144/90 | HR 88 | Temp 97.8°F | Ht 69.5 in | Wt 258.4 lb

## 2019-01-22 DIAGNOSIS — I208 Other forms of angina pectoris: Secondary | ICD-10-CM | POA: Diagnosis not present

## 2019-01-22 DIAGNOSIS — Z79899 Other long term (current) drug therapy: Secondary | ICD-10-CM | POA: Insufficient documentation

## 2019-01-22 DIAGNOSIS — R079 Chest pain, unspecified: Secondary | ICD-10-CM | POA: Diagnosis present

## 2019-01-22 DIAGNOSIS — R0602 Shortness of breath: Secondary | ICD-10-CM | POA: Diagnosis not present

## 2019-01-22 DIAGNOSIS — I272 Pulmonary hypertension, unspecified: Secondary | ICD-10-CM

## 2019-01-22 DIAGNOSIS — J984 Other disorders of lung: Secondary | ICD-10-CM

## 2019-01-22 DIAGNOSIS — I10 Essential (primary) hypertension: Secondary | ICD-10-CM | POA: Diagnosis not present

## 2019-01-22 DIAGNOSIS — Z87891 Personal history of nicotine dependence: Secondary | ICD-10-CM | POA: Diagnosis not present

## 2019-01-22 DIAGNOSIS — I2089 Other forms of angina pectoris: Secondary | ICD-10-CM

## 2019-01-22 LAB — I-STAT TROPONIN, ED
TROPONIN I, POC: 0 ng/mL (ref 0.00–0.08)
Troponin i, poc: 0 ng/mL (ref 0.00–0.08)

## 2019-01-22 LAB — BASIC METABOLIC PANEL
Anion gap: 9 (ref 5–15)
BUN: 16 mg/dL (ref 8–23)
CO2: 29 mmol/L (ref 22–32)
Calcium: 9.6 mg/dL (ref 8.9–10.3)
Chloride: 101 mmol/L (ref 98–111)
Creatinine, Ser: 0.98 mg/dL (ref 0.61–1.24)
GFR calc non Af Amer: >60 mL/min (ref >60)
Glucose, Bld: 119 mg/dL — ABNORMAL HIGH (ref 70–99)
Potassium: 3.9 mmol/L (ref 3.5–5.1)
Sodium: 139 mmol/L (ref 135–145)

## 2019-01-22 LAB — CBC
HCT: 45 % (ref 39.0–52.0)
Hemoglobin: 14.7 g/dL (ref 13.0–17.0)
MCH: 34.3 pg — ABNORMAL HIGH (ref 26.0–34.0)
MCHC: 32.7 g/dL (ref 30.0–36.0)
MCV: 104.9 fL — AB (ref 80.0–100.0)
PLATELETS: 201 10*3/uL (ref 150–400)
RBC: 4.29 MIL/uL (ref 4.22–5.81)
RDW: 12.8 % (ref 11.5–15.5)
WBC: 7.9 10*3/uL (ref 4.0–10.5)
nRBC: 0 % (ref 0.0–0.2)

## 2019-01-22 IMAGING — DX DG CHEST 2V
2 series · 2 of 2 positions shown · non-contrast
Comparison: [DATE]

CLINICAL DATA: LEFT side chest pain and worsening shortness of
former smoker, GERD

EXAM:
CHEST - 2 VIEW

[chest ap]
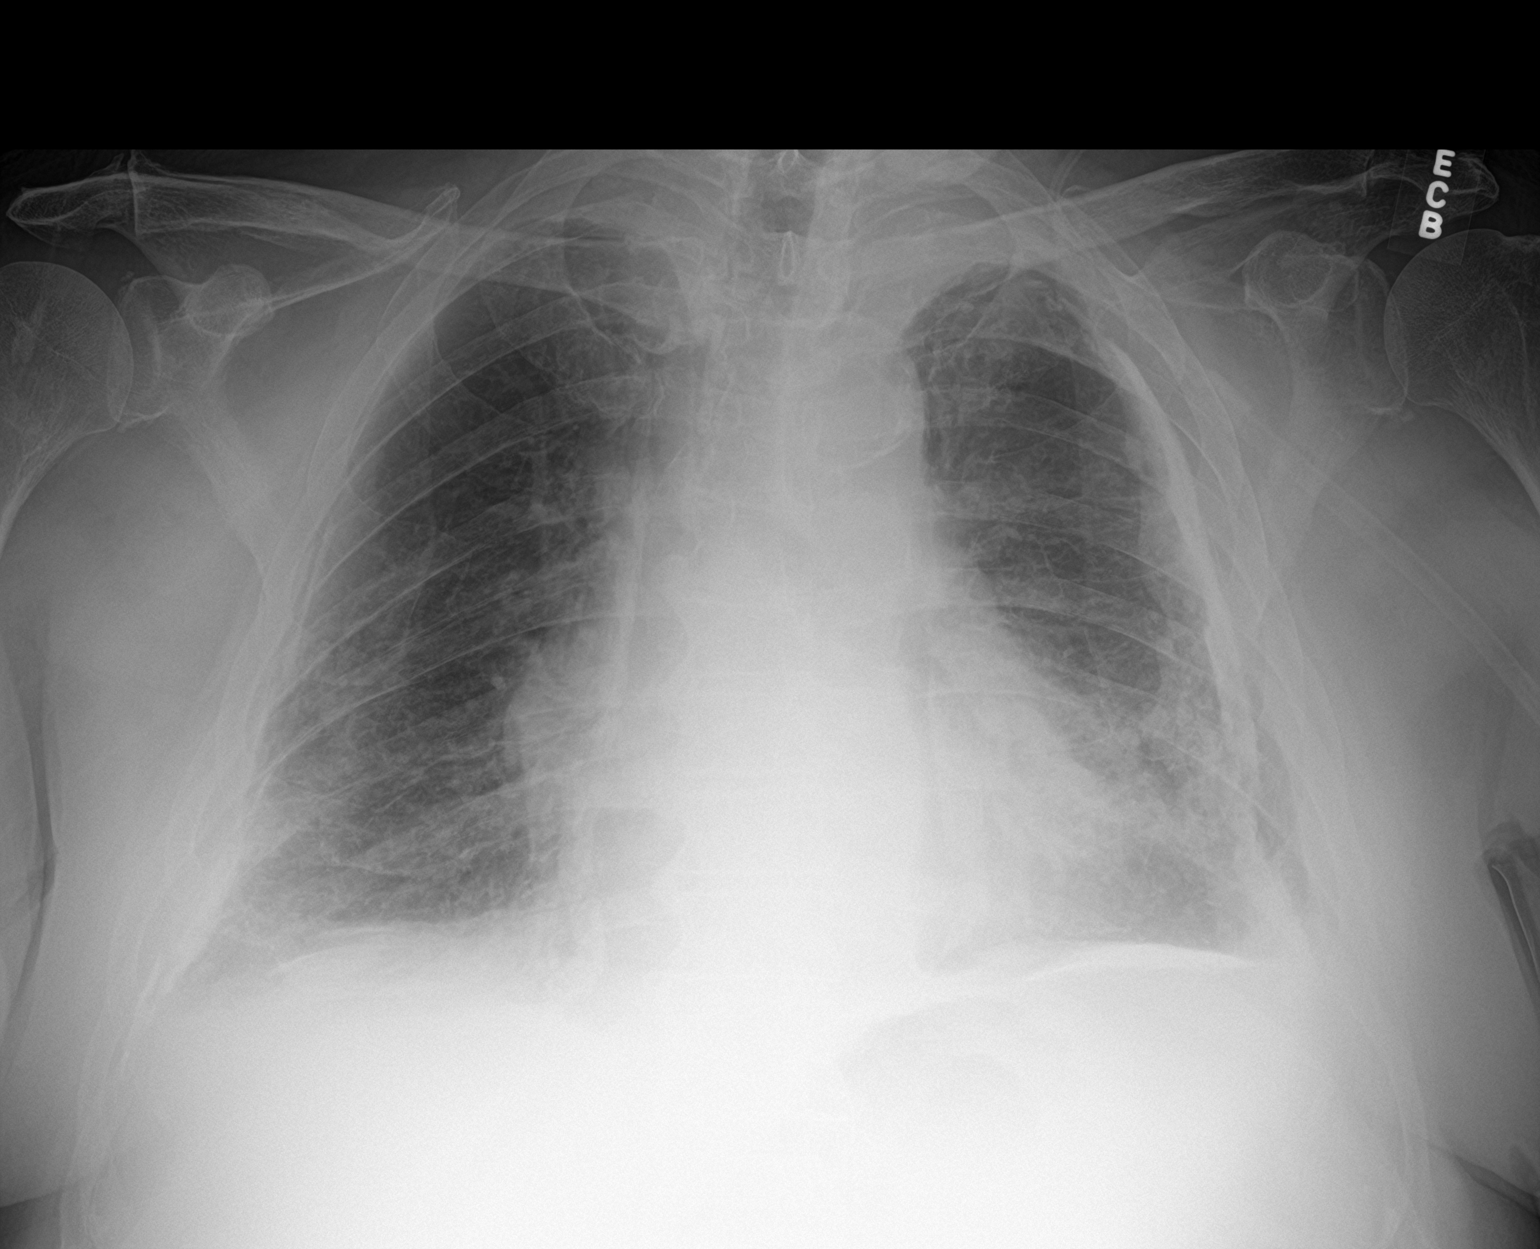

[chest lat]
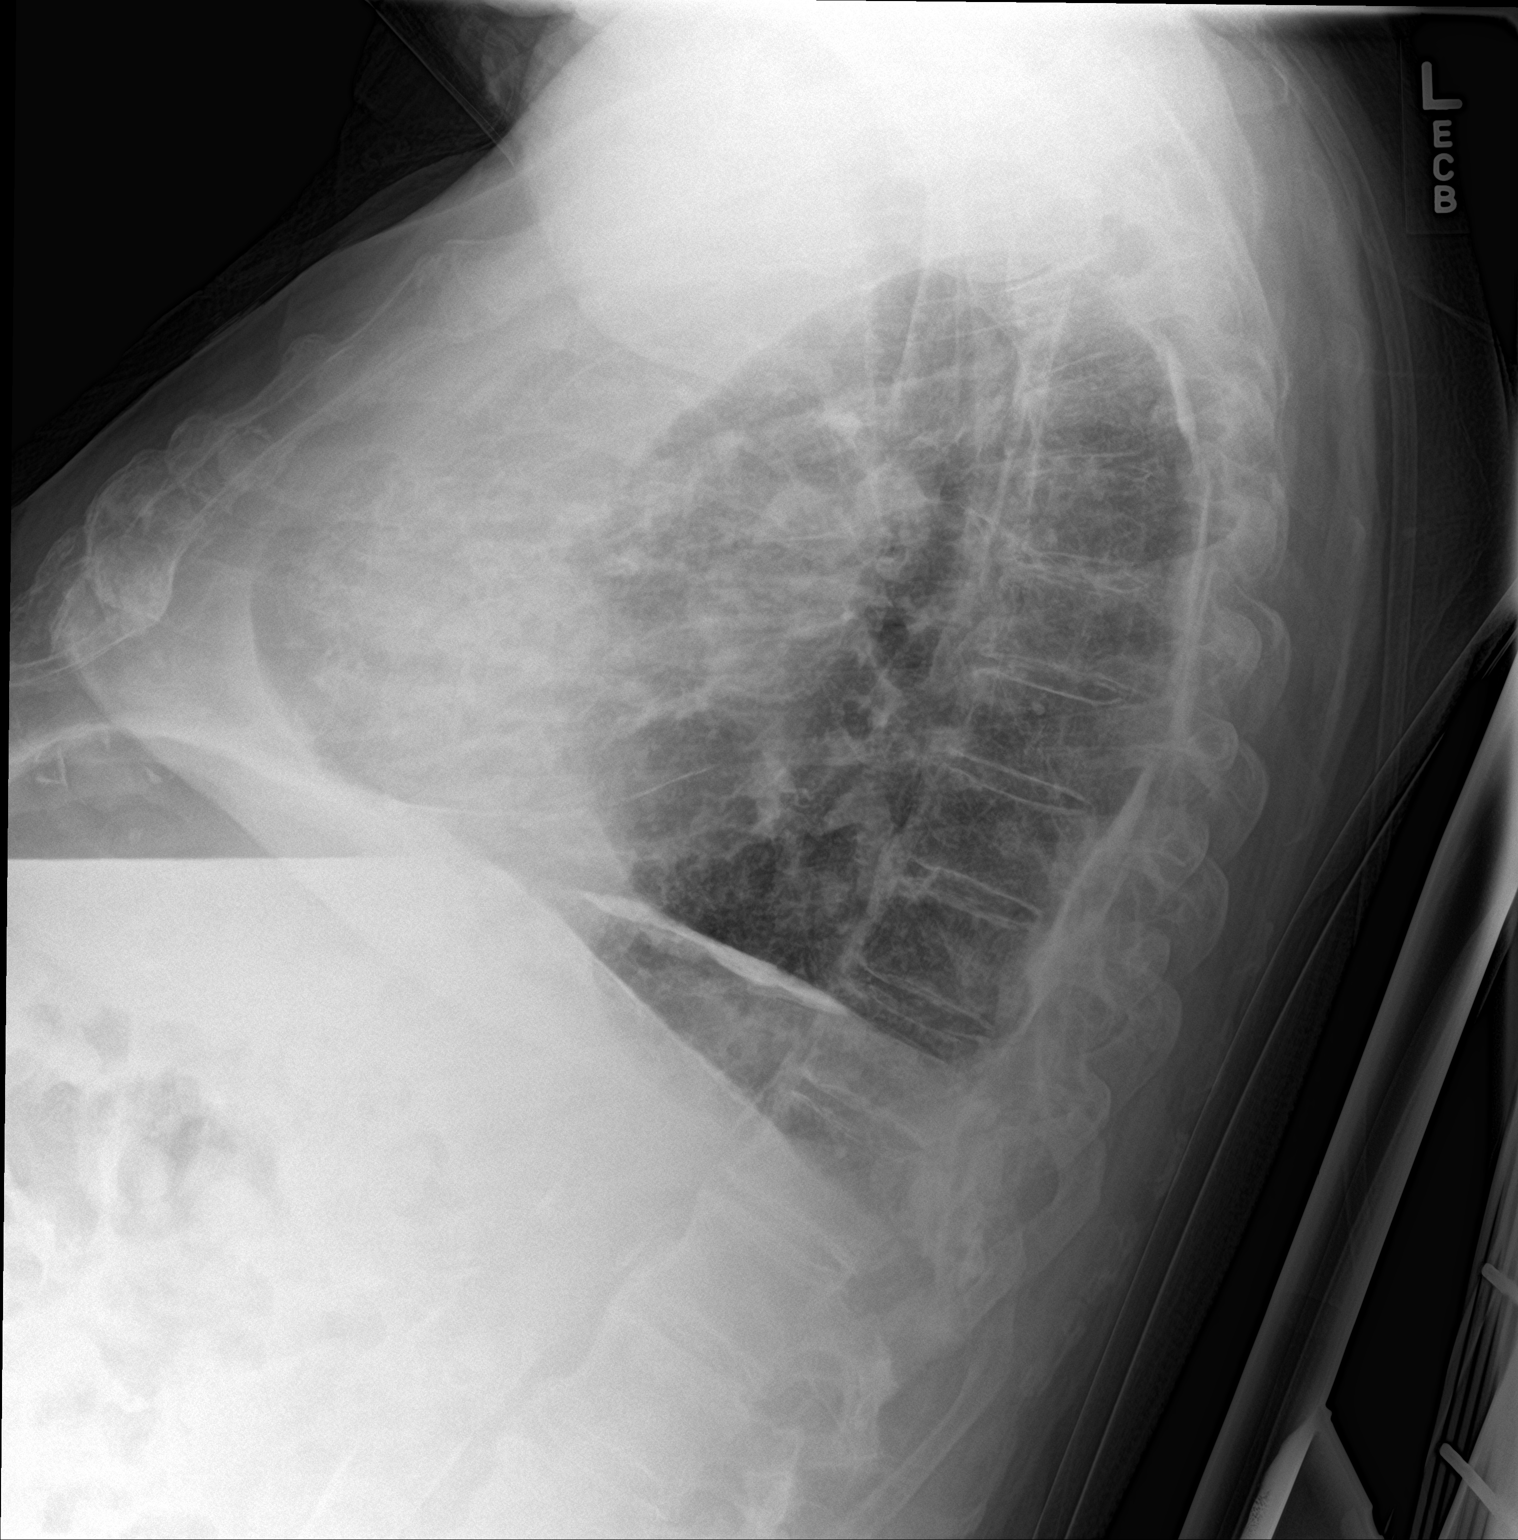

[2 of 2 positions shown; findings below may reference images not displayed]

FINDINGS: Enlargement of cardiac silhouette.

Atherosclerotic calcification aorta.

Pulmonary vascularity normal.

BILATERAL fibrothorax with extensive pleural calcifications and
scarring in the periphery of both lungs.

Chronic pleuroparenchymal opacities at the lung apices,
asymmetrically greater on LEFT, stable.

No definite superimposed acute infiltrate, pleural effusion or
pneumothorax.

Bones demineralized.
IMPRESSION: Extensive pleural calcifications and fibrothorax bilaterally with
parenchymal lung scarring, stable.

No definite superimposed acute infiltrate.

## 2019-01-22 MED ORDER — SODIUM CHLORIDE 0.9% FLUSH: 3.0000 mL | Freq: Once | INTRAVENOUS | Status: DC

## 2019-01-22 MED ORDER — IPRATROPIUM BROMIDE 0.02 % IN SOLN
0.5000 mg | Freq: Once | RESPIRATORY_TRACT | Status: AC
  Administered 2019-01-22: 0.5 mg via RESPIRATORY_TRACT
  Filled 2019-01-22: qty 2.5

## 2019-01-22 MED ORDER — ALBUTEROL SULFATE (2.5 MG/3ML) 0.083% IN NEBU
5.0000 mg | INHALATION_SOLUTION | Freq: Once | RESPIRATORY_TRACT | Status: AC
  Administered 2019-01-22: 5 mg via RESPIRATORY_TRACT
  Filled 2019-01-22: qty 6

## 2019-01-22 NOTE — Patient Instructions (Signed)
It was great to see you!  Because of all of your risk factors -- atrial fibrillation, severe pulmonary hypertension, increased need for oxygen, history of blood clots AND because of your current symptoms, we need to send you to the ER for further evaluation and treatment.  Take care,  Jarold Motto PA-C

## 2019-01-22 NOTE — Discharge Instructions (Signed)
It was our pleasure to provide your ER care today - we hope that you feel better.  Follow up with your pulmonologist in the coming week - call office to arrange appointment.   Also follow up with cardiologist in the next 1-2 weeks.   Return to ER if worse, new symptoms, fevers, increased trouble breathing, persistent or recurrent chest pain, fainting spells, other concern.

## 2019-01-22 NOTE — ED Triage Notes (Signed)
Pt reports he has had chest pain and shortness of breath X2 days. Pt also states he has been feeling off balance for 2 months. Skin warm and dry no distress noted.

## 2019-01-22 NOTE — ED Notes (Signed)
Patient transported to CT 

## 2019-01-22 NOTE — ED Provider Notes (Signed)
[facility] EMERGENCY DEPARTMENT Provider Note   CSN: [id number] Arrival date & time: 01/22/19  1342    History   Chief Complaint Chief Complaint  Patient presents with  . Shortness of Breath  . Chest Pain    HPI Maurice Reed is a 78 y.o. male.     Patient with hx afib, chronic lung disease, OSA, presents c/o general sob for past few days. States hx sob, but seems more so in past 2-3 days. Symptoms gradual onset, constant, mild-moderate, persistent. Notes dull/slight chest discomfort, mid chest, for past 2 days, constant, non radiating, not pleuritic. Mild nasal congestion, occ non prod cough. No fevers. Denies leg pain or swelling. Compliant w normal meds. +anticoag therapy for afib, no abnormal bleeding. Also notes intermittently gets sense of mild dizziness. Denies vertigo or room spinning. Denies any falls. No tinnitus or hearing loss. Notes hx vertigo. No associated palpitations or fast heart beat. Denies problems w balance/walking.   The history is provided by the patient and the spouse.  Shortness of Breath  Associated symptoms: chest pain   Associated symptoms: no abdominal pain, no fever, no headaches, no neck pain, no rash, no sore throat and no vomiting   Chest Pain  Associated symptoms: shortness of breath   Associated symptoms: no abdominal pain, no back pain, no fever, no headache, no numbness, no palpitations and no vomiting     Past Medical History:  Diagnosis Date  . Arthritis   . Atrial fibrillation ([place])   . 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 GERD (gastroesophageal reflux disease)   . Kidney stones   . Obesity     Patient Active Problem List   Diagnosis Date Noted  . 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 Bilateral lower extremity edema 07/04/2018  . Sclerosis of the skin 06/26/2018  . Pulmonary emphysema ([place]) 05/22/2018  . Insomnia 05/22/2018  . Erectile dysfunction 05/22/2018  . Obesity   .  Kidney stones   . Essential hypertension   . GERD (gastroesophageal reflux disease)   . Former smoker   . Arthritis   . 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 OSA treated with BiPAP 06/17/2013  . 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 Atrial fibrillation ([place]) 06/17/2013  . Rosacea 06/17/2013  . 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 Pulmonary embolism ([place]) 12/05/1998    Past Surgical History:  Procedure Laterality Date  . APPENDECTOMY  1963  . LITHOTRIPSY  1987  . NOSE SURGERY  2003  . TONSILLECTOMY AND ADENOIDECTOMY  1948  . UMBILICAL HERNIA REPAIR  1984        Home Medications    Prior to Admission medications   Medication Sig Start Date End Date Taking? Authorizing Provider  albuterol (PROVENTIL HFA) 108 (90 Base) MCG/ACT inhaler Inhale into the lungs.    [provider]  apixaban (ELIQUIS) 5 MG TABS tablet TAKE 1 TABLET(5 MG) BY MOUTH TWICE DAILY 12/12/18   Inda Coke, PA  atorvastatin (LIPITOR) 40 MG tablet Take 1 tablet (40 mg total) by mouth daily. 05/22/18   Inda Coke, PA  Atropine Sulfate-NaCl 0.01-0.9 % SOLN Apply to eye.    [provider]  Cholecalciferol (VITAMIN D3) 3000 units TABS Take by mouth.    [provider]  clobetasol (TEMOVATE) 0.05 % external solution APPLY TO AFFECTED AREAS ON SCALP ONE TO TWO TIMES A DAY AS NEEDED 12/12/18   Morene Rankins,  Samantha, PA  docusate sodium (COLACE) 100 MG capsule Take by mouth.    [provider]  doxycycline (VIBRA-TABS) 100 MG tablet TAKE 1 TABLET(100 MG) BY MOUTH DAILY 11/20/18   Inda Coke, PA  erythromycin ophthalmic ointment APP THIN LAYER IN OS BID 05/01/18   [provider]  fluticasone (FLONASE) 50 MCG/ACT nasal spray Place into the nose.    [provider]  loratadine (CLARITIN) 10 MG tablet Take 10 mg by mouth daily. 04/10/18   [provider]  magnesium oxide (MAGNESIUM-OXIDE) 400 (241.3 Mg)  MG tablet TAKE 1 TABLET BY MOUTH TWICE DAILY 01/10/19   Inda Coke, PA  metoprolol succinate (TOPROL-XL) 25 MG 24 hr tablet TAKE 1 TABLET BY MOUTH DAILY 01/03/19   Inda Coke, PA  mometasone (NASONEX) 50 MCG/ACT nasal spray Place 2 sprays into the nose daily. Patient not taking: Reported on 01/22/2019 11/22/18   Inda Coke, PA  Naloxone HCl (EVZIO) 2 MG/0.4ML SOAJ by Injection route as directed. 09/21/17   [provider]  NARCAN 4 MG/0.1ML LIQD nasal spray kit INSTILL ONE SPRAY PRN FOR ACCIDENTAL OVERDOSE 08/22/18   [provider]  oxyCODONE (OXY IR/ROXICODONE) 5 MG immediate release tablet Take 5 mg by mouth 2 (two) times daily as needed. for pain 04/23/18   [provider]  potassium chloride (MICRO-K) 10 MEQ CR capsule TAKE 1 CAPSULE BY MOUTH TWICE DAILY 01/02/19   Inda Coke, PA  prednisoLONE acetate (PRED FORTE) 1 % ophthalmic suspension SHAKE LQ AND INT 1 GTT IN OS QD PRN 05/01/18   [provider]  tadalafil (CIALIS) 10 MG tablet Take 1 tablet (10 mg total) by mouth daily as needed for erectile dysfunction. Patient not taking: Reported on 01/22/2019 05/22/18   Inda Coke, PA  Tafluprost (ZIOPTAN) 0.0015 % SOLN Apply to eye. 07/10/17   [provider]  tamsulosin (FLOMAX) 0.4 MG CAPS capsule TK 1 C PO HS 12/14/18   Worley, [place], PA  temazepam (RESTORIL) 15 MG capsule TAKE 1 CAPSULE BY MOUTH EVERY NIGHT AT BEDTIME AS NEEDED FOR SLEEP 01/09/19   Inda Coke, PA  torsemide (DEMADEX) 20 MG tablet Take 1 tablet (20 mg total) by mouth 2 (two) times daily. 12/12/18   Inda Coke, PA  triamcinolone cream (KENALOG) 0.1 % APPLY AA BID 08/21/18   [provider]    Family History Family History  Problem Relation Age of Onset  . Arthritis Mother   . Hearing loss Mother   . Hypertension Mother   . Heart disease Mother   . Stroke Mother   . Arthritis Father   . Hearing loss Father   . Heart disease Father   .  Hypertension Father   . Heart attack Father   . Kidney disease Father   . Stroke Father     Social History Social History   Tobacco Use  . Smoking status: Former Smoker    Packs/day: 3.00    Years: 25.00    Pack years: 75.00    Types: Cigarettes    Last attempt to quit: 11/28/1984    Years since quitting: 34.1  . Smokeless tobacco: Never Used  Substance Use Topics  . Alcohol use: Never    Frequency: Never  . Drug use: Never     Allergies   Other   Review of Systems Review of Systems  Constitutional: Negative for fever.  HENT: Positive for congestion and rhinorrhea. Negative for sore throat.   Eyes: Negative for redness and visual disturbance.  Respiratory: Positive for shortness of breath.   Cardiovascular: Positive for chest pain. Negative for palpitations and leg swelling.  Gastrointestinal: Negative for abdominal pain, blood in stool and vomiting.  Genitourinary: Negative for dysuria and flank pain.  Musculoskeletal: Negative for back pain and neck pain.  Skin: Negative for rash.  Neurological: Negative for speech difficulty, numbness and headaches.  Hematological:       +anticoag therapy.   Psychiatric/Behavioral: Negative for confusion.     Physical Exam Updated Vital Signs BP 122/77   Pulse 78   Temp 97.7 F (36.5 C) (Oral)   Resp 18   Ht 1.765 m (5' 9.5")   Wt 116.6 kg   SpO2 99%   BMI 37.41 kg/m   Physical Exam Vitals signs and nursing note reviewed.  Constitutional:      Appearance: Normal appearance. He is well-developed.  HENT:     Head: Atraumatic.     Right Ear: Tympanic membrane and ear canal normal.     Left Ear: Tympanic membrane and ear canal normal.     Nose: Congestion present.     Mouth/Throat:     Mouth: Mucous membranes are moist.     Pharynx: Oropharynx is clear.  Eyes:     General: No scleral icterus.    Conjunctiva/sclera: Conjunctivae normal.     Pupils: Pupils are equal, round, and reactive to light.  Neck:      Musculoskeletal: Normal range of motion and neck supple. No neck rigidity.     Trachea: No tracheal deviation.  Cardiovascular:     Rate and Rhythm: Normal rate. Rhythm irregular.     Pulses: Normal pulses.     Heart sounds: Normal heart sounds. No murmur. No friction rub. No gallop.   Pulmonary:     Effort: Pulmonary effort is normal. No accessory muscle usage or respiratory distress.     Comments: V mild wheeze.  Abdominal:     General: Bowel sounds are normal. There is no distension.     Palpations: Abdomen is soft.     Tenderness: There is no abdominal tenderness. There is no guarding.  Genitourinary:    Comments: No cva tenderness. Musculoskeletal:        General: No swelling or tenderness.  Skin:    General: Skin is warm and dry.     Findings: No rash.  Neurological:     Mental Status: He is alert.     Comments: Alert, speech clear/fluent. Motor intact bil, stre 5/5. sens grossly intact. No pronator drift. Finger to nose normal bil. Steady gait.   Psychiatric:        Mood and Affect: Mood normal.      ED Treatments / Results  Labs (all labs ordered are listed, but only abnormal results are displayed) Results for orders placed or performed during the hospital encounter of 01/77/93  Basic metabolic panel  Result Value Ref Range   Sodium 139 135 - 145 mmol/L   Potassium 3.9 3.5 - 5.1 mmol/L   Chloride 101 98 - 111 mmol/L   CO2 29 22 - 32 mmol/L   Glucose, Bld 119 (H) 70 - 99 mg/dL   BUN 16 8 - 23 mg/dL   Creatinine, Ser 0.98 0.61 - 1.24 mg/dL   Calcium 9.6 8.9 - 10.3 mg/dL   GFR calc non Af Amer >60 >60 mL/min   GFR calc Af Amer >60 >60 mL/min   Anion gap 9 5 - 15  CBC  Result Value Ref Range  WBC 7.9 4.0 - 10.5 K/uL   RBC 4.29 4.22 - 5.81 MIL/uL   Hemoglobin 14.7 13.0 - 17.0 g/dL   HCT 45.0 39.0 - 52.0 %   MCV 104.9 (H) 80.0 - 100.0 fL   MCH 34.3 (H) 26.0 - 34.0 pg   MCHC 32.7 30.0 - 36.0 g/dL   RDW 12.8 11.5 - 15.5 %   Platelets 201 150 - 400 K/uL   nRBC  0.0 0.0 - 0.2 %  I-stat troponin, ED  Result Value Ref Range   Troponin i, poc 0.00 0.00 - 0.08 ng/mL   Comment 3          I-stat troponin, ED  Result Value Ref Range   Troponin i, poc 0.00 0.00 - 0.08 ng/mL   Comment 3           Dg Chest 2 View  Result Date: 01/22/2019 CLINICAL DATA:  LEFT side chest pain and worsening shortness of breath, lightheadedness for 2 weeks, history atrial fibrillation, former smoker, GERD EXAM: CHEST - 2 VIEW COMPARISON:  05/29/2018 FINDINGS: Enlargement of cardiac silhouette. Atherosclerotic calcification aorta. Pulmonary vascularity normal. BILATERAL fibrothorax with extensive pleural calcifications and scarring in the periphery of both lungs. Chronic pleuroparenchymal opacities at the lung apices, asymmetrically greater on LEFT, stable. No definite superimposed acute infiltrate, pleural effusion or pneumothorax. Bones demineralized. IMPRESSION: Extensive pleural calcifications and fibrothorax bilaterally with parenchymal lung scarring, stable. No definite superimposed acute infiltrate. Electronically Signed   By: Lavonia Dana M.D.   On: 01/22/2019 15:29    EKG EKG Interpretation  Date/Time:  Tuesday January 22 2019 13:45:16 EST Ventricular Rate:  78 PR Interval:    QRS Duration: 94 QT Interval:  368 QTC Calculation: 419 R Axis:   -7 Text Interpretation:  Atrial fibrillation No previous tracing Confirmed by Lajean Saver [phone]) on 01/22/2019 4:45:51 PM   Radiology Dg Chest 2 View  Result Date: 01/22/2019 CLINICAL DATA:  LEFT side chest pain and worsening shortness of breath, lightheadedness for 2 weeks, history atrial fibrillation, former smoker, GERD EXAM: CHEST - 2 VIEW COMPARISON:  05/29/2018 FINDINGS: Enlargement of cardiac silhouette. Atherosclerotic calcification aorta. Pulmonary vascularity normal. BILATERAL fibrothorax with extensive pleural calcifications and scarring in the periphery of both lungs. Chronic pleuroparenchymal opacities at the lung  apices, asymmetrically greater on LEFT, stable. No definite superimposed acute infiltrate, pleural effusion or pneumothorax. Bones demineralized. IMPRESSION: Extensive pleural calcifications and fibrothorax bilaterally with parenchymal lung scarring, stable. No definite superimposed acute infiltrate. Electronically Signed   By: Lavonia Dana M.D.   On: 01/22/2019 15:29    Procedures Procedures (including critical care time)  Medications Ordered in ED Medications  sodium chloride flush (NS) 0.9 % injection 3 mL (has no administration in time range)  albuterol (PROVENTIL) (2.5 MG/3ML) 0.083% nebulizer solution 5 mg (has no administration in time range)  ipratropium (ATROVENT) nebulizer solution 0.5 mg (has no administration in time range)     Initial Impression / Assessment and Plan / ED Course  I have reviewed the triage vital signs and the nursing notes.  Pertinent labs & imaging results that were available during my care of the patient were reviewed by me and considered in my medical decision making (see chart for details).  Iv ns. Continuous pulse ox and monitor. Labs. Ecg. Cxr.   Albuterol neb.   Reviewed nursing notes and prior charts for additional history.   Recheck breathing comfortably. Good air exchange. Pulse ox 96%. No wheezing.   Labs reviewed - initial trop  normal.  cxr reviewed - chronic changes, pleural plaques, neg acute.   Will get delta trop.   Delta trop neg/0. No chest pain. States breathing at baseline.  Ambulates in ED with steady gait. No ataxia. No faintness.   Pt currently appears stable for d/c.     Final Clinical Impressions(s) / ED Diagnoses   Final diagnoses:  None    ED Discharge Orders    None       Lajean Saver, MD 01/22/19 1920

## 2019-01-22 NOTE — Progress Notes (Signed)
Maurice Reed is a 78 y.o. male here for a follow up of a pre-existing problem.  I acted as a Education administrator for [employer], PA-C Anselmo Pickler, LPN  History of Present Illness:   Chief Complaint  Patient presents with  . Balance issues    Fluid in both ears.  Veritgo    HPI   Has been feeling "woozy" for the past two months or so.  Was seen at [facility] 2/11 and was Dx with inner ear infection. Pt was given Omnicef, Kenalog injection and told to take Meclizine to help with dizziness. He is taking the meclizine three times a day.   Using his oxygen during the day now, which he normally does not do. Getting more short of breath than usual. Having some chest discomfort, describes as a dull pain. States that the last time he felt like this he was admitted for HF exacerbation.  Symptoms are not improving with time. No significant weight gain. Denies fevers.  Significant past cardiac and pulmonary history. He reports that he is compliant with his eliquis.  Wt Readings from Last 5 Encounters:  01/22/19 258 lb 6.1 oz (117.2 kg)  11/22/18 256 lb (116.1 kg)  10/17/18 270 lb 6.4 oz (122.7 kg)  08/23/18 273 lb 4 oz (123.9 kg)  07/09/18 270 lb 9.6 oz (122.7 kg)       Past Medical History:  Diagnosis Date  . Arthritis   . Atrial fibrillation ([place])   . 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 GERD (gastroesophageal reflux disease)   . Kidney stones   . Obesity      Social History   Socioeconomic History  . Marital status: Married    Spouse name: Not on file  . Number of children: Not on file  . Years of education: Not on file  . Highest education level: Not on file  Occupational History  . Not on file  Social Needs  . Financial resource strain: Not on file  . Food insecurity:    Worry: Not on file    Inability: Not on file  . Transportation needs:    Medical: Not on file    Non-medical: Not on file  Tobacco Use  . Smoking status: Former  Smoker    Packs/day: 3.00    Years: 25.00    Pack years: 75.00    Types: Cigarettes    Last attempt to quit: 11/28/1984    Years since quitting: 34.1  . Smokeless tobacco: Never Used  Substance and Sexual Activity  . Alcohol use: Never    Frequency: Never  . Drug use: Never  . Sexual activity: Yes  Lifestyle  . Physical activity:    Days per week: Not on file    Minutes per session: Not on file  . Stress: Not on file  Relationships  . Social connections:    Talks on phone: Not on file    Gets together: Not on file    Attends religious service: Not on file    Active member of club or organization: Not on file    Attends meetings of clubs or organizations: Not on file    Relationship status: Not on file  . Intimate partner violence:    Fear of current or ex partner: Not on file    Emotionally abused: Not on file    Physically abused: Not on file    Forced sexual activity: Not on file  Other Topics Concern  . Not on file  Social History Narrative   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19)   Married to [employer] ([place] patient)   7 children   Currently lives with son in [place], [place]; all their things are in storage; plan to travel and stay with kids but keep their home base in the [place] area    Past Surgical History:  Procedure Laterality Date  . APPENDECTOMY  1963  . LITHOTRIPSY  1987  . NOSE SURGERY  2003  . TONSILLECTOMY AND ADENOIDECTOMY  1948  . UMBILICAL HERNIA REPAIR  1984    Family History  Problem Relation Age of Onset  . Arthritis Mother   . Hearing loss Mother   . Hypertension Mother   . Heart disease Mother   . Stroke Mother   . Arthritis Father   . Hearing loss Father   . Heart disease Father   . Hypertension Father   . Heart attack Father   . Kidney disease Father   . Stroke Father     Allergies  Allergen Reactions  . Other Itching    Cats: watery eyes, itching, sneezing    Current  Medications:   Current Outpatient Medications:  .  albuterol (PROVENTIL HFA) 108 (90 Base) MCG/ACT inhaler, Inhale into the lungs., Disp: , Rfl:  .  apixaban (ELIQUIS) 5 MG TABS tablet, TAKE 1 TABLET(5 MG) BY MOUTH TWICE DAILY, Disp: 180 tablet, Rfl: 1 .  atorvastatin (LIPITOR) 40 MG tablet, Take 1 tablet (40 mg total) by mouth daily., Disp: 90 tablet, Rfl: 0 .  Atropine Sulfate-NaCl 0.01-0.9 % SOLN, Apply to eye., Disp: , Rfl:  .  Cholecalciferol (VITAMIN D3) 3000 units TABS, Take by mouth., Disp: , Rfl:  .  clobetasol (TEMOVATE) 0.05 % external solution, APPLY TO AFFECTED AREAS ON SCALP ONE TO TWO TIMES A DAY AS NEEDED, Disp: 150 mL, Rfl: 4 .  docusate sodium (COLACE) 100 MG capsule, Take by mouth., Disp: , Rfl:  .  doxycycline (VIBRA-TABS) 100 MG tablet, TAKE 1 TABLET(100 MG) BY MOUTH DAILY, Disp: 90 tablet, Rfl: 0 .  loratadine (CLARITIN) 10 MG tablet, Take 10 mg by mouth daily., Disp: , Rfl: 0 .  magnesium oxide (MAGNESIUM-OXIDE) 400 (241.3 Mg) MG tablet, TAKE 1 TABLET BY MOUTH TWICE DAILY, Disp: 180 tablet, Rfl: 3 .  metoprolol succinate (TOPROL-XL) 25 MG 24 hr tablet, TAKE 1 TABLET BY MOUTH DAILY, Disp: 90 tablet, Rfl: 0 .  Naloxone HCl (EVZIO) 2 MG/0.4ML SOAJ, by Injection route as directed., Disp: , Rfl:  .  oxyCODONE (OXY IR/ROXICODONE) 5 MG immediate release tablet, Take 5 mg by mouth 2 (two) times daily as needed. for pain, Disp: , Rfl: 0 .  potassium chloride (MICRO-K) 10 MEQ CR capsule, TAKE 1 CAPSULE BY MOUTH TWICE DAILY, Disp: 180 capsule, Rfl: 3 .  prednisoLONE acetate (PRED FORTE) 1 % ophthalmic suspension, SHAKE LQ AND INT 1 GTT IN OS QD PRN, Disp: , Rfl: 1 .  Tafluprost (ZIOPTAN) 0.0015 % SOLN, Apply to eye., Disp: , Rfl:  .  tamsulosin (FLOMAX) 0.4 MG CAPS capsule, TK 1 C PO HS, Disp: 90 capsule, Rfl: 1 .  temazepam (RESTORIL) 15 MG capsule, TAKE 1 CAPSULE BY MOUTH EVERY NIGHT AT BEDTIME AS NEEDED FOR SLEEP, Disp: 30 capsule, Rfl: 2 .  torsemide (DEMADEX) 20 MG tablet, Take  1 tablet (20 mg total) by mouth 2 (two) times daily., Disp: 180 tablet, Rfl: 3 .  triamcinolone cream (KENALOG) 0.1 %, APPLY AA BID, Disp: , Rfl: 0 .  erythromycin ophthalmic ointment, APP THIN LAYER IN OS BID, Disp: , Rfl: 1 .  fluticasone (FLONASE) 50 MCG/ACT nasal spray, Place into the nose., Disp: , Rfl:  .  mometasone (NASONEX) 50 MCG/ACT nasal spray, Place 2 sprays into the nose daily. (Patient not taking: Reported on 01/22/2019), Disp: 17 g, Rfl: 2 .  NARCAN 4 MG/0.1ML LIQD nasal spray kit, INSTILL ONE SPRAY PRN FOR ACCIDENTAL OVERDOSE, Disp: , Rfl: 0 .  tadalafil (CIALIS) 10 MG tablet, Take 1 tablet (10 mg total) by mouth daily as needed for erectile dysfunction. (Patient not taking: Reported on 01/22/2019), Disp: 10 tablet, Rfl: 0   Review of Systems:   Review of Systems  Constitutional: Positive for malaise/fatigue. Negative for chills, diaphoresis, fever and weight loss.  HENT: Positive for congestion. Negative for hearing loss, sinus pain and sore throat.   Respiratory: Positive for shortness of breath. Negative for cough and hemoptysis.   Cardiovascular: Positive for chest pain. Negative for palpitations, leg swelling and PND.  Gastrointestinal: Negative for abdominal pain, constipation, diarrhea, heartburn, nausea and vomiting.  Musculoskeletal: Negative for back pain, myalgias and neck pain.  Neurological: Positive for dizziness. Negative for tingling, seizures and headaches.  Psychiatric/Behavioral: Negative for depression. The patient is not nervous/anxious and does not have insomnia.     Vitals:   Vitals:   01/22/19 1114  BP: (!) 144/90  Pulse: 88  Temp: 97.8 F (36.6 C)  TempSrc: Oral  SpO2: 98%  Weight: 258 lb 6.1 oz (117.2 kg)  Height: 5' 9.5" (1.765 m)     Body mass index is 37.61 kg/m.  Physical Exam:   Physical Exam Vitals signs and nursing note reviewed.  Constitutional:      General: He is not in acute distress.    Appearance: He is well-developed.  He is not ill-appearing or toxic-appearing.  HENT:     Head: Normocephalic.     Right Ear: Tympanic membrane, ear canal and external ear normal.     Left Ear: Tympanic membrane, ear canal and external ear normal.     Mouth/Throat:     Lips: Pink.     Mouth: Mucous membranes are moist.     Pharynx: Oropharynx is clear. No posterior oropharyngeal erythema.     Tonsils: No tonsillar exudate. Swelling: 1+ on the right. 1+ on the left.  Cardiovascular:     Rate and Rhythm: Normal rate. Rhythm irregularly irregular.     Pulses: Normal pulses.     Heart sounds: Normal heart sounds, S1 normal and S2 normal.     Comments: No LE edema Pulmonary:     Effort: Pulmonary effort is normal. No accessory muscle usage or respiratory distress.     Breath sounds: Examination of the right-lower field reveals decreased breath sounds. Examination of the left-lower field reveals decreased breath sounds. Decreased breath sounds present. No wheezing, rhonchi or rales.     Comments: Currently on oxygen Skin:    General: Skin is warm and dry.  Neurological:     Mental Status: He is alert.     GCS: GCS eye subscore is 4. GCS verbal subscore is 5. GCS motor subscore is 6.  Psychiatric:        Speech: Speech normal.        Behavior: Behavior normal. Behavior is cooperative.    EKG tracing is personally reviewed.  EKG notes irregularly irregular.  No acute changes.  Assessment and Plan:   Maurice Reed was seen today for balance issues.  Diagnoses and all orders for this visit:  SOB (shortness of breath) -     EKG 12-Lead  Chest pain, unspecified type -     EKG 12-Lead   EKG tracing is personally reviewed.  EKG notes irregularly irregular.  No acute changes.  Due to multiple comorbidities and symptoms, feel patient would best be served in ER. Patient and wife in agreement, declined EMS transport.   . Reviewed expectations re: course of current medical issues. . Discussed self-management of  symptoms. . Outlined signs and symptoms indicating need for more acute intervention. . Patient verbalized understanding and all questions were answered. . See orders for this visit as documented in the electronic medical record. . Patient received an After-Visit Summary.  CMA or LPN served as scribe during this visit. History, Physical, and Plan performed by medical provider. The above documentation has been reviewed and is accurate and complete.  Patient has a HIGH level of medical complexity due to number of diagnoses/treatment options and amount/complexity of data reviewed.   Inda Coke, PA-C

## 2019-01-22 NOTE — ED Notes (Signed)
Patient verbalizes understanding of discharge instructions. Opportunity for questioning and answers were provided. Armband removed by staff, pt discharged from ED.  

## 2019-01-24 ENCOUNTER — Ambulatory Visit: Payer: Medicare Other | Admitting: Primary Care

## 2019-01-24 ENCOUNTER — Encounter: Payer: Self-pay | Admitting: Primary Care

## 2019-01-24 VITALS — BP 118/76 | HR 64 | Temp 97.9°F | Ht 69.5 in | Wt 258.0 lb

## 2019-01-24 DIAGNOSIS — G4733 Obstructive sleep apnea (adult) (pediatric): Secondary | ICD-10-CM | POA: Diagnosis not present

## 2019-01-24 DIAGNOSIS — R0602 Shortness of breath: Secondary | ICD-10-CM | POA: Diagnosis not present

## 2019-01-24 MED ORDER — UMECLIDINIUM-VILANTEROL 62.5-25 MCG/INH IN AEPB: 1.0000 | INHALATION_SPRAY | Freq: Every day | RESPIRATORY_TRACT | 0 refills | Status: DC

## 2019-01-24 NOTE — Patient Instructions (Addendum)
  Office testing: - Ambulatory oxygen low 92%   RX: Anoro - take 1 puff daily (samples given)  Recommendations: - Start Anoro  - Use Albuterol rescue inhaler every 4-6 hours as needed for shortness of breath or wheezing/chest tightness   Follow-up: - Dr. Vassie Loll in 4-6 weeks with full PFTs prior  (MD has openings the week of March 10th or the 24th)

## 2019-01-24 NOTE — Assessment & Plan Note (Signed)
-   Patient is compliant with BiPAP, controlled on download

## 2019-01-24 NOTE — Assessment & Plan Note (Signed)
-   Progressively worsening dyspnea - Trial Anoro 1 puffs daily; prn Albuterol hfa q6 - Needs full PFTs with follow-up in 4-6 weeks

## 2019-01-24 NOTE — Progress Notes (Signed)
'@Patient'  ID: Maurice Reed, male    DOB: 06-18-1941, 78 y.o.   MRN: [id number]  Chief Complaint  Patient presents with  . Follow-up    SOB with exertion, chest tightness    Referring provider: Inda Coke, PA  HPI: 78 year old male, former smoker quit 1985 (75 pack year hx). 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 Patient of Dr. [name], last seen on 10/17/18.   01/24/2019 Patient presents today for UC and ED follow-up. Saw UC 2/10 and was given Cefdininir and meclazine. He has one day left of his anitbiotic. Dizziness has improved. Ambulating with cane. Went to ED on 2/18 with progressively worsening dyspnea. He received Albuterol and Atrovent nebulizer with improvement and was discharged home.   Accompanied by his daughter-in-law. He is feeling better than he was. Some dyspnea on exertion and chest tightness, he has not required his rescue inhaler. Wearing oxygen more in the last few weeks. Not on maintenance therapy. Last PFTs in 2018 at an outside office. Has baseline leg swelling, wearing compression stockings. Hx PAH, follows with cardiology. Takes Torsemide 38m BID. Complaint with BIPAP. Denies fever, productive cough, weight gain.   [facility]: - Usage 88/90 days; 98% >4hrs - Pressure IPAP 14cmH20; EPAP 8cm H20 - AHI 0.2  Allergies  Allergen Reactions  . Other Itching    Cats: watery eyes, itching, sneezing    Immunization History  Administered Date(s) Administered  . Influenza, High Dose Seasonal PF 09/21/2018  . Influenza, Seasonal, Injecte, Preservative Fre 09/28/2017  . Pneumococcal Polysaccharide-23 12/14/2015    Past Medical History:  Diagnosis Date  . Arthritis   . [place]   . 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 GERD (gastroesophageal reflux disease)   . Kidney stones   . Obesity     Tobacco History: Social History    Tobacco Use  Smoking Status Former Smoker  . Packs/day: 3.00  . Years: 25.00  . Pack years: 75.00  . Types: Cigarettes  . Last attempt to quit: 11/28/1984  . Years since quitting: 34.1  Smokeless Tobacco Never Used   Counseling given: Not Answered   Outpatient Medications Prior to Visit  Medication Sig Dispense Refill  . albuterol (PROVENTIL HFA) 108 (90 Base) MCG/ACT inhaler Inhale into the lungs.    .Marland Kitchenapixaban (ELIQUIS) 5 MG TABS tablet TAKE 1 TABLET(5 MG) BY MOUTH TWICE DAILY 180 tablet 1  . atorvastatin (LIPITOR) 40 MG tablet Take 1 tablet (40 mg total) by mouth daily. 90 tablet 0  . Atropine Sulfate-NaCl 0.01-0.9 % SOLN Apply to eye.    . Cholecalciferol (VITAMIN D3) 3000 units TABS Take by mouth.    . clobetasol (TEMOVATE) 0.05 % external solution APPLY TO AFFECTED AREAS ON SCALP ONE TO TWO TIMES A DAY AS NEEDED 150 mL 4  . docusate sodium (COLACE) 100 MG capsule Take by mouth.    . doxycycline (VIBRA-TABS) 100 MG tablet TAKE 1 TABLET(100 MG) BY MOUTH DAILY 90 tablet 0  . erythromycin ophthalmic ointment APP THIN LAYER IN OS BID  1  . fluticasone (FLONASE) 50 MCG/ACT nasal spray Place into the nose.    . loratadine (CLARITIN) 10 MG tablet Take 10 mg by mouth daily.  0  . magnesium oxide (MAGNESIUM-OXIDE) 400 (241.3 Mg) MG tablet TAKE 1 TABLET BY MOUTH TWICE DAILY 180 tablet 3  . metoprolol succinate (TOPROL-XL) 25 MG 24  hr tablet TAKE 1 TABLET BY MOUTH DAILY 90 tablet 0  . mometasone (NASONEX) 50 MCG/ACT nasal spray Place 2 sprays into the nose daily. 17 g 2  . Naloxone HCl (EVZIO) 2 MG/0.4ML SOAJ by Injection route as directed.    Marland Kitchen NARCAN 4 MG/0.1ML LIQD nasal spray kit INSTILL ONE SPRAY PRN FOR ACCIDENTAL OVERDOSE  0  . oxyCODONE (OXY IR/ROXICODONE) 5 MG immediate release tablet Take 5 mg by mouth 2 (two) times daily as needed. for pain  0  . potassium chloride (MICRO-K) 10 MEQ CR capsule TAKE 1 CAPSULE BY MOUTH TWICE DAILY 180 capsule 3  . prednisoLONE acetate (PRED  FORTE) 1 % ophthalmic suspension SHAKE LQ AND INT 1 GTT IN OS QD PRN  1  . tadalafil (CIALIS) 10 MG tablet Take 1 tablet (10 mg total) by mouth daily as needed for erectile dysfunction. 10 tablet 0  . Tafluprost (ZIOPTAN) 0.0015 % SOLN Apply to eye.    . tamsulosin (FLOMAX) 0.4 MG CAPS capsule TK 1 C PO HS 90 capsule 1  . temazepam (RESTORIL) 15 MG capsule TAKE 1 CAPSULE BY MOUTH EVERY NIGHT AT BEDTIME AS NEEDED FOR SLEEP 30 capsule 2  . torsemide (DEMADEX) 20 MG tablet Take 1 tablet (20 mg total) by mouth 2 (two) times daily. 180 tablet 3  . triamcinolone cream (KENALOG) 0.1 % APPLY AA BID  0  . cefdinir (OMNICEF) 300 MG capsule      No facility-administered medications prior to visit.     Review of Systems  Review of Systems  Constitutional: Negative.   HENT: Negative.   Respiratory: Positive for chest tightness and shortness of breath. Negative for cough.   Neurological: Positive for dizziness.       Intermittent dizziness  Psychiatric/Behavioral: Negative.     Physical Exam  BP 118/76 (BP Location: Left Arm, Cuff Size: Normal)   Pulse 64   Temp 97.9 F (36.6 C)   Ht 5' 9.5" (1.765 m)   Wt 258 lb (117 kg)   SpO2 96%   BMI 37.55 kg/m  Physical Exam Constitutional:      Appearance: Normal appearance. He is not ill-appearing.  HENT:     Head: Normocephalic and atraumatic.     Mouth/Throat:     Mouth: Mucous membranes are moist.     Pharynx: Oropharynx is clear.  Eyes:     Extraocular Movements: Extraocular movements intact.     Pupils: Pupils are equal, round, and reactive to light.  Neck:     Musculoskeletal: Normal range of motion and neck supple.  Cardiovascular:     Rate and Rhythm: Normal rate. Rhythm irregular.     Comments: +1-2 chronic BLE edema  Pulmonary:     Effort: Pulmonary effort is normal.     Breath sounds: No wheezing.     Comments: CTA, diminished at bases  Musculoskeletal: Normal range of motion.     Comments: In WC  Skin:    General: Skin  is warm and dry.  Neurological:     General: No focal deficit present.     Mental Status: He is alert and oriented to person, place, and time. Mental status is at baseline.  Psychiatric:        Mood and Affect: Mood normal.        Behavior: Behavior normal.        Thought Content: Thought content normal.        Judgment: Judgment normal.      Lab  Results:  CBC    Component Value Date/Time   WBC 7.9 01/22/2019 1405   RBC 4.29 01/22/2019 1405   HGB 14.7 01/22/2019 1405   HCT 45.0 01/22/2019 1405   PLT 201 01/22/2019 1405   MCV 104.9 (H) 01/22/2019 1405   MCH 34.3 (H) 01/22/2019 1405   MCHC 32.7 01/22/2019 1405   RDW 12.8 01/22/2019 1405    BMET    Component Value Date/Time   NA 139 01/22/2019 1405   K 3.9 01/22/2019 1405   CL 101 01/22/2019 1405   CO2 29 01/22/2019 1405   GLUCOSE 119 (H) 01/22/2019 1405   BUN 16 01/22/2019 1405   CREATININE 0.98 01/22/2019 1405   CALCIUM 9.6 01/22/2019 1405   GFRNONAA >60 01/22/2019 1405   GFRAA >60 01/22/2019 1405    BNP No results found for: BNP  ProBNP No results found for: PROBNP  Imaging: Dg Chest 2 View  Result Date: 01/22/2019 CLINICAL DATA:  LEFT side chest pain and worsening shortness of breath, lightheadedness for 2 weeks, history atrial fibrillation, former smoker, GERD EXAM: CHEST - 2 VIEW COMPARISON:  05/29/2018 FINDINGS: Enlargement of cardiac silhouette. Atherosclerotic calcification aorta. Pulmonary vascularity normal. BILATERAL fibrothorax with extensive pleural calcifications and scarring in the periphery of both lungs. Chronic pleuroparenchymal opacities at the lung apices, asymmetrically greater on LEFT, stable. No definite superimposed acute infiltrate, pleural effusion or pneumothorax. Bones demineralized. IMPRESSION: Extensive pleural calcifications and fibrothorax bilaterally with parenchymal lung scarring, stable. No definite superimposed acute infiltrate. Electronically Signed   By: Lavonia Dana M.D.   On:  01/22/2019 15:29     Assessment & Plan:   Pulmonary emphysema ([place]) - Progressively worsening dyspnea - Trial Anoro 1 puffs daily; prn Albuterol hfa q6 - Needs full PFTs with follow-up in 4-6 weeks   OSA treated with BiPAP - Patient is compliant with BiPAP, controlled on download    Martyn Ehrich, NP 01/24/2019

## 2019-02-01 DIAGNOSIS — H903 Sensorineural hearing loss, bilateral: Secondary | ICD-10-CM | POA: Insufficient documentation

## 2019-02-05 ENCOUNTER — Other Ambulatory Visit: Payer: Self-pay | Admitting: Physician Assistant

## 2019-02-14 ENCOUNTER — Ambulatory Visit: Payer: Medicare Other | Admitting: Pulmonary Disease

## 2019-02-14 ENCOUNTER — Encounter: Payer: Self-pay | Admitting: Pulmonary Disease

## 2019-02-14 ENCOUNTER — Other Ambulatory Visit: Payer: Self-pay

## 2019-02-14 ENCOUNTER — Ambulatory Visit (INDEPENDENT_AMBULATORY_CARE_PROVIDER_SITE_OTHER): Payer: Medicare Other | Admitting: Pulmonary Disease

## 2019-02-14 DIAGNOSIS — J929 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: Secondary | ICD-10-CM

## 2019-02-14 DIAGNOSIS — I2729 Other secondary pulmonary hypertension: Secondary | ICD-10-CM | POA: Insufficient documentation

## 2019-02-14 DIAGNOSIS — J432 Centrilobular emphysema: Secondary | ICD-10-CM

## 2019-02-14 DIAGNOSIS — R0602 Shortness of breath: Secondary | ICD-10-CM | POA: Diagnosis not present

## 2019-02-14 LAB — PULMONARY FUNCTION TEST
DL/VA % pred: 146 %
DL/VA: 5.76 ml/min/mmHg/L
DLCO cor % pred: 66 %
DLCO cor: 16.31 ml/min/mmHg
DLCO unc % pred: 66 %
DLCO unc: 16.35 ml/min/mmHg
FEF 25-75 Post: 1.75 L/sec
FEF 25-75 Pre: 2.2 L/sec
FEF2575-%Change-Post: -20 %
FEF2575-%Pred-Post: 84 %
FEF2575-%Pred-Pre: 106 %
FEV1-%Change-Post: -3 %
FEV1-%Pred-Post: 57 %
FEV1-%Pred-Pre: 60 %
FEV1-Post: 1.69 L
FEV1-Pre: 1.76 L
FEV1FVC-%Change-Post: 0 %
FEV1FVC-%Pred-Pre: 118 %
FEV6-%Change-Post: -4 %
FEV6-%Pred-Post: 51 %
FEV6-%Pred-Pre: 53 %
FEV6-PRE: 2.05 L
FEV6-Post: 1.95 L
FEV6FVC-%Pred-Post: 106 %
FEV6FVC-%Pred-Pre: 106 %
FVC-%Change-Post: -4 %
FVC-%PRED-POST: 47 %
FVC-%Pred-Pre: 50 %
FVC-Post: 1.95 L
FVC-Pre: 2.05 L
Post FEV1/FVC ratio: 86 %
Post FEV6/FVC ratio: 100 %
Pre FEV1/FVC ratio: 86 %
Pre FEV6/FVC Ratio: 100 %
RV % PRED: 56 %
RV: 1.45 L
TLC % pred: 48 %
TLC: 3.37 L

## 2019-02-14 MED ORDER — MOMETASONE FUROATE 50 MCG/ACT NA SUSP: 2.0000 | Freq: Every day | NASAL | 2 refills | Status: DC

## 2019-02-14 NOTE — Assessment & Plan Note (Signed)
Likely secondary although he does have prior history of PE Not a candidate for vasodilator therapy

## 2019-02-14 NOTE — Assessment & Plan Note (Signed)
Schedule high-resolution CT of the chest to see if there is progression Fibrothorax is more likely related to previous pneumonia rather than asbestosis

## 2019-02-14 NOTE — Patient Instructions (Signed)
Refill on Nasonex.   Okay to take Zyrtec daily during spring. Okay to stop taking Anoro. Schedule high-resolution CT chest without contrast to follow-up on pleural plaques

## 2019-02-14 NOTE — Assessment & Plan Note (Signed)
Doubt this is significant.  No obstruction on PFTs. Okay to discontinue Anoro

## 2019-02-14 NOTE — Progress Notes (Signed)
   Subjective:    Patient ID: DOCTOR COBAS, male    DOB: [birth year], 78 y.o.   MRN: [id number]  HPI  78 year old remote smoker for  FU of COPD, OSA  and pulmonary hypertension He has pleural plaques/ fibrothorax consistent with asbestosis but no convincing history of asbestos exposure.  He has a history of severe pneumonia in 2006 He smoked about 2 packs/day before he quit in his 83s, more than 50 pack years.  PMH -  DVT/PE in 2002 , chr atrial fibrillation and is maintained on Eliquis, his last flare of CHF was in 2015. 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 Follow-up    2wk f/u for SOB. Had a PFT today. Still has some issues with his balance. Was told in the hospital that he has pulmonary HTN.    He developed sinus congestion and vertigo, initially went to urgent care in the first week of February and was given cefdinir and meclizine.  He had an ED visit in 2/18 for acute bronchitis which he attributes to the sinus drip.  PCP gave him additional antibiotics and he improved but dizziness persists.  His balance is poor and that seems to be his main issue. Chest x-ray 2/18 was reviewed which shows bilateral fibrothorax without significant ILD.  He was seen by NP on 2/20 and given Anoro and PFTs were scheduled. 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 Features of asbestosis areNOTidentified.   01/2018 titration >>bipap 18/12   11/2017 spiro ratio 86, FEV 1 45%, FVC 39 % , severe restriction ABG 12/2017 7.42/45/82/ 97% RA  PFT 02/2019 no airway obstruction, ratio 86, FEV1 60%, F VC 50%, no bronchodilator response, TLC 48%, DLCO 66% , corrects for  alveolar volume-moderate intraparenchymal restriction  Echo 07/2018 RVSP 57   Past Medical History:  Diagnosis Date  . Arthritis   . Atrial fibrillation ([facility])   . 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 GERD (gastroesophageal reflux disease)   . Kidney stones   . Obesity     Review of Systems neg for any significant sore throat, dysphagia, itching, sneezing, nasal congestion or excess/ purulent secretions, fever, chills, sweats, unintended wt loss, pleuritic or exertional cp, hempoptysis, orthopnea pnd or change in chronic leg swelling. Also denies presyncope, palpitations, heartburn, abdominal pain, nausea, vomiting, diarrhea or change in bowel or urinary habits, dysuria,hematuria, rash, arthralgias, visual complaints, headache, numbness weakness or ataxia.     Objective:   Physical Exam  Gen. Pleasant, obese, in no distress, normal affect ENT - no pallor,icterus, no post nasal drip, class 2-3 airway Neck: No JVD, no thyromegaly, no carotid bruits Lungs: no use of accessory muscles, no dullness to percussion, bibasal  rales or rhonchi  Cardiovascular: Rhythm regular, heart sounds  normal, no murmurs or gallops, no peripheral edema Abdomen: soft and non-tender, no hepatosplenomegaly, BS normal. Musculoskeletal: No deformities, no cyanosis or clubbing Neuro:  alert, non focal, no tremors       Assessment & Plan:

## 2019-02-14 NOTE — Addendum Note (Signed)
Addended by: Sylvester Harder on: 02/14/2019 04:15 PM   Modules accepted: Orders

## 2019-02-14 NOTE — Progress Notes (Signed)
Full PFT performed today. °

## 2019-02-14 NOTE — Assessment & Plan Note (Signed)
Compliant and this is helping. Continue BiPAP current settings  Weight loss encouraged, compliance with goal of at least 4-6 hrs every night is the expectation. Advised against medications with sedative side effects Cautioned against driving when sleepy - understanding that sleepiness will vary on a day to day basis'

## 2019-02-19 ENCOUNTER — Telehealth: Payer: Self-pay

## 2019-02-19 NOTE — Telephone Encounter (Signed)
lvm of MD request to reschedule 

## 2019-02-20 ENCOUNTER — Ambulatory Visit: Payer: Medicare Other | Admitting: Cardiovascular Disease

## 2019-02-26 ENCOUNTER — Ambulatory Visit: Payer: Medicare Other | Admitting: Pulmonary Disease

## 2019-02-27 ENCOUNTER — Telehealth: Payer: Self-pay | Admitting: Cardiovascular Disease

## 2019-02-27 DIAGNOSIS — R600 Localized edema: Secondary | ICD-10-CM

## 2019-02-27 NOTE — Telephone Encounter (Signed)
New Message         Pt c/o swelling: STAT is pt has developed SOB within 24 hours  1) How much weight have you gained and in what time span? 3 1/2-7 pds  2) If swelling, where is the swelling located? Legs and feet  3) Are you currently taking a fluid pill? Yes  4) Are you currently SOB? Yes  5) Do you have a log of your daily weights (if so, list)? 03/16/253.3 3/17 255.3 3/18 255.8 3/19 256/1 3/20/254.5 3/24 257.1   Have you gained 3 pounds in a day or 5 pounds in a week? Yes  6) Have you traveled recently? No

## 2019-02-28 ENCOUNTER — Other Ambulatory Visit: Payer: Self-pay | Admitting: Physician Assistant

## 2019-02-28 NOTE — Telephone Encounter (Signed)
Spoke with pt wife, the dizziness has gotten a little better this week and his bp is running 119/75 to 108/72. He has seen an ENT and they did not feel it was related to inner ear. She also reports a change in his breathing, the patient was only wearing his oxygen in the evening and now he is wearing it all day. Dr Vassie Loll repeated PFT's and there has been no change. They were then told to call use because of his hx of pulmonary hypertension. She also reports that problems started when they had to switch pharmacies. She has researched the manufacture of the torsemide that [facility] sent them and they have a bad rating. She feels that his output is low on the torsemide from the current manufacture and has requested a different one. She reports his weight will be up 3-4 lbs,  He will double the torsemide and will loose the weight. Patient scheduled for an echo to evaluate pulmonary hypertension prior to follow up appointment in late April. Wife wants dr berry to know what is going on and make sure he is okay with appointments in April. Will forward to dr berry to review and advise

## 2019-02-28 NOTE — Telephone Encounter (Signed)
Spoke to patient's wife she stated she is worried about husband.Stated he has been dizzy for the past 4 weeks. He is unsteady when he walks.Stated dizziness is getting worse.He also is having swelling in lower legs.He has not been urinating as much.Stated he takes Torsemide 20 mg twice a day yesterday he took 40 mg twice a day and lost 3 lbs over night.Stated she would like him to be seen.

## 2019-02-28 NOTE — Telephone Encounter (Signed)
Returned call to patient no answer.LMTC. 

## 2019-02-28 NOTE — Telephone Encounter (Signed)
Follow up  ° ° °Patient is returning call.  °

## 2019-02-28 NOTE — Telephone Encounter (Signed)
Yes, that's fine. His last 2D performed 8 months ago was nl. He has COPD and OSA on O2 and BiPap. His SOB is probably multifactorial but hard to assess virtually.

## 2019-03-01 NOTE — Telephone Encounter (Signed)
Left message for patient of dr berry recommendations.

## 2019-03-04 ENCOUNTER — Other Ambulatory Visit: Payer: Self-pay | Admitting: Physician Assistant

## 2019-03-05 ENCOUNTER — Other Ambulatory Visit: Payer: Self-pay

## 2019-03-05 ENCOUNTER — Telehealth: Payer: Self-pay | Admitting: Physician Assistant

## 2019-03-05 MED ORDER — MOMETASONE FUROATE 50 MCG/ACT NA SUSP: 2.0000 | Freq: Every day | NASAL | 2 refills | Status: AC

## 2019-03-05 NOTE — Telephone Encounter (Signed)
Patient is requesting a refill of Doxycycline 100mg  tabs. Please send to [facility] on [address]

## 2019-03-05 NOTE — Telephone Encounter (Signed)
Schedule webex for refill. °

## 2019-03-06 ENCOUNTER — Ambulatory Visit (INDEPENDENT_AMBULATORY_CARE_PROVIDER_SITE_OTHER): Payer: Medicare Other | Admitting: Physician Assistant

## 2019-03-06 ENCOUNTER — Encounter: Payer: Self-pay | Admitting: Physician Assistant

## 2019-03-06 VITALS — BP 126/70

## 2019-03-06 DIAGNOSIS — M7989 Other specified soft tissue disorders: Secondary | ICD-10-CM | POA: Diagnosis not present

## 2019-03-06 DIAGNOSIS — L719 Rosacea, unspecified: Secondary | ICD-10-CM | POA: Diagnosis not present

## 2019-03-06 MED ORDER — DOXYCYCLINE HYCLATE 100 MG PO TABS: 100.0000 mg | ORAL_TABLET | Freq: Every day | ORAL | 5 refills | Status: DC

## 2019-03-06 NOTE — Progress Notes (Signed)
Virtual Visit via Video   I connected with Maurice Reed on 03/06/19 at  1:20 PM EDT by a video enabled telemedicine application and verified that I am speaking with the correct person using two identifiers. Location patient: Home Location provider: [facility], Office Persons participating in the virtual visit: [place], [place], [place]  I discussed the limitations of evaluation and management by telemedicine and the availability of in person appointments. The patient expressed understanding and agreed to proceed.  **Interactive audio and video telecommunications were attempted between this provider and patient having technical difficulties OR patient did not have access to video capability. We continued and completed visit with audio only.  Subjective:   HPI: Medication refill Pt needs refill on Doxycycline that he takes for Rosacea. 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  Echo on hold till 5/10, Cardio appt 5/21. Pt was started on Torsemide 20 mg twice a day, since started on pt has noticed increase in LE edema. Does not feel it is working, has made a call to Dr. Gwenlyn Found but was told to not make any changes. Pt is keeping legs elevated, wearing compression hose and watching salt intake. Pt denies increase in SOB, using Oxygen at 2L/min via nasal cannula 24/7. Denies calf pain or increased calf size of one leg increased compared to other.  ROS: See pertinent positives and negatives per HPI.  Patient Active Problem List   Diagnosis Date Noted  . Other secondary pulmonary hypertension ([place]) 02/14/2019  . 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 Bilateral lower extremity edema 07/04/2018  . Sclerosis of the skin 06/26/2018  . Pulmonary emphysema ([place]) 05/22/2018  . Insomnia 05/22/2018  . Erectile dysfunction 05/22/2018  . Obesity   . Kidney stones   . Essential hypertension   . GERD (gastroesophageal reflux disease)   . Former smoker   . Arthritis    . 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 OSA treated with BiPAP 06/17/2013  . 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 Atrial fibrillation ([place]) 06/17/2013  . Rosacea 06/17/2013  . 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 Pulmonary embolism ([place]) 12/05/1998    Social History   Tobacco Use  . Smoking status: Former Smoker    Packs/day: 3.00    Years: 25.00    Pack years: 75.00    Types: Cigarettes    Last attempt to quit: 11/28/1984    Years since quitting: 34.2  . Smokeless tobacco: Never Used  Substance Use Topics  . Alcohol use: Never    Frequency: Never    Current Outpatient Medications:  .  albuterol (PROVENTIL HFA) 108 (90 Base) MCG/ACT inhaler, Inhale into the lungs., Disp: , Rfl:  .  apixaban (ELIQUIS) 5 MG TABS tablet, TAKE 1 TABLET(5 MG) BY MOUTH TWICE DAILY, Disp: 180 tablet, Rfl: 1 .  Atropine Sulfate-NaCl 0.01-0.9 % SOLN, Apply to eye., Disp: , Rfl:  .  Cholecalciferol (VITAMIN D3) 3000 units TABS, Take by mouth., Disp: , Rfl:  .  clobetasol (TEMOVATE) 0.05 % external solution, APPLY TO AFFECTED AREAS ON SCALP ONE TO TWO TIMES A DAY AS NEEDED, Disp: 150 mL, Rfl: 4 .  desonide (DESOWEN) 0.05 % lotion, Apply topically., Disp: , Rfl:  .  docusate sodium (COLACE) 100 MG capsule, Take by mouth., Disp: , Rfl:  .  erythromycin ophthalmic ointment, APP THIN LAYER IN OS BID, Disp: , Rfl: 1 .  loratadine (CLARITIN) 10 MG tablet, Take  10 mg by mouth daily., Disp: , Rfl: 0 .  magnesium oxide (MAGNESIUM-OXIDE) 400 (241.3 Mg) MG tablet, TAKE 1 TABLET BY MOUTH TWICE DAILY, Disp: 180 tablet, Rfl: 3 .  metoprolol succinate (TOPROL-XL) 25 MG 24 hr tablet, TAKE 1 TABLET BY MOUTH  DAILY, Disp: 90 tablet, Rfl: 0 .  mometasone (NASONEX) 50 MCG/ACT nasal spray, Place 2 sprays into the nose daily., Disp: 51 g, Rfl: 2 .  NARCAN 4 MG/0.1ML LIQD nasal spray kit, INSTILL ONE SPRAY PRN FOR ACCIDENTAL OVERDOSE, Disp: , Rfl: 0 .  oxyCODONE (OXY  IR/ROXICODONE) 5 MG immediate release tablet, Take 5 mg by mouth 2 (two) times daily as needed. for pain, Disp: , Rfl: 0 .  potassium chloride (MICRO-K) 10 MEQ CR capsule, TAKE 1 CAPSULE BY MOUTH TWICE DAILY, Disp: 180 capsule, Rfl: 3 .  Tafluprost (ZIOPTAN) 0.0015 % SOLN, Apply to eye., Disp: , Rfl:  .  temazepam (RESTORIL) 15 MG capsule, TAKE 1 CAPSULE BY MOUTH EVERY NIGHT AT BEDTIME AS NEEDED FOR SLEEP, Disp: 30 capsule, Rfl: 2 .  torsemide (DEMADEX) 20 MG tablet, TAKE 1 TABLET BY MOUTH TWICE DAILY, Disp: 180 tablet, Rfl: 0 .  triamcinolone cream (KENALOG) 0.1 %, APPLY AA BID, Disp: , Rfl: 0 .  doxycycline (VIBRA-TABS) 100 MG tablet, Take 1 tablet (100 mg total) by mouth daily., Disp: 30 tablet, Rfl: 5  Allergies  Allergen Reactions  . Other Itching    Cats: watery eyes, itching, sneezing    Objective:   VITALS: Per patient if applicable, see vitals. GEN: No obvious evidence of difficulty breathing, able to speak in complete sentences without becoming short of breath  **Interactive audio and video telecommunications were attempted between this provider and patient having technical difficulties OR patient did not have access to video capability. We continued and completed visit with audio only.  Assessment and Plan:   Biran was seen today for medication refill.  Diagnoses and all orders for this visit:  Leg swelling Continue current efforts, close follow-up with Dr. Gwenlyn Found.  Rosacea Refill doxycycline today.  Other orders -     doxycycline (VIBRA-TABS) 100 MG tablet; Take 1 tablet (100 mg total) by mouth daily.   . Reviewed expectations re: course of current medical issues. . Discussed self-management of symptoms. . Outlined signs and symptoms indicating need for more acute intervention. . Patient verbalized understanding and all questions were answered. Marland Kitchen Health Maintenance issues including appropriate healthy diet, exercise, and smoking avoidance were discussed with  patient. . See orders for this visit as documented in the electronic medical record.   I discussed the assessment and treatment plan with the patient. The patient was provided an opportunity to ask questions and all were answered. The patient agreed with the plan and demonstrated an understanding of the instructions.   The patient was advised to call back or seek an in-person evaluation if the symptoms worsen or if the condition fails to improve as anticipated.  CMA or LPN served as scribe during this visit. History, Physical, and Plan performed by medical provider. The above documentation has been reviewed and is accurate and complete.   [place], [place] 03/06/2019

## 2019-03-06 NOTE — Telephone Encounter (Signed)
Called pt and left vm to schedule webex.  °

## 2019-03-11 ENCOUNTER — Other Ambulatory Visit: Payer: Self-pay | Admitting: Physician Assistant

## 2019-03-12 ENCOUNTER — Telehealth: Payer: Self-pay | Admitting: Cardiology

## 2019-03-12 NOTE — Telephone Encounter (Signed)
Based om chart review  I would keep this echo appointment on the schedule for 03/15/2019. Thank you, Tobias Alexander

## 2019-03-13 NOTE — Telephone Encounter (Signed)
Dr. Delton See wanted [place] to keep Maurice Reed echo on the schedule for 03/15/2019

## 2019-03-15 ENCOUNTER — Ambulatory Visit (HOSPITAL_COMMUNITY): Payer: Medicare Other | Attending: Cardiovascular Disease

## 2019-03-15 ENCOUNTER — Other Ambulatory Visit: Payer: Self-pay

## 2019-03-15 DIAGNOSIS — R600 Localized edema: Secondary | ICD-10-CM | POA: Diagnosis present

## 2019-03-25 ENCOUNTER — Telehealth: Payer: Self-pay | Admitting: Cardiovascular Disease

## 2019-03-25 NOTE — Telephone Encounter (Signed)
Mychart, smartphone, pre reg complete 03/25/19 AF °

## 2019-03-26 ENCOUNTER — Telehealth: Payer: Self-pay

## 2019-03-26 ENCOUNTER — Encounter: Payer: Self-pay | Admitting: *Deleted

## 2019-03-26 ENCOUNTER — Telehealth (INDEPENDENT_AMBULATORY_CARE_PROVIDER_SITE_OTHER): Payer: Medicare Other | Admitting: Cardiovascular Disease

## 2019-03-26 ENCOUNTER — Ambulatory Visit: Payer: Medicare Other | Admitting: Cardiovascular Disease

## 2019-03-26 ENCOUNTER — Encounter

## 2019-03-26 ENCOUNTER — Telehealth: Payer: Self-pay | Admitting: *Deleted

## 2019-03-26 DIAGNOSIS — R600 Localized edema: Secondary | ICD-10-CM | POA: Diagnosis not present

## 2019-03-26 DIAGNOSIS — I10 Essential (primary) hypertension: Secondary | ICD-10-CM | POA: Diagnosis not present

## 2019-03-26 DIAGNOSIS — G4733 Obstructive sleep apnea (adult) (pediatric): Secondary | ICD-10-CM

## 2019-03-26 DIAGNOSIS — I4811 Longstanding persistent atrial fibrillation: Secondary | ICD-10-CM | POA: Diagnosis not present

## 2019-03-26 NOTE — Telephone Encounter (Signed)
Virtual Visit Pre-Appointment Phone Call  "(Name), I am calling you today to discuss your upcoming appointment. We are currently trying to limit exposure to the virus that causes COVID-19 by seeing patients at home rather than in the office."  1. "What is the BEST phone number to call the day of the visit?" - include this in appointment notes  2. Do you have or have access to (through a family member/friend) a smartphone with video capability that we can use for your visit?" a. If yes - list this number in appt notes as cell (if different from BEST phone #) and list the appointment type as a VIDEO visit in appointment notes b. If no - list the appointment type as a PHONE visit in appointment notes  3. Confirm consent - "In the setting of the current Covid19 crisis, you are scheduled for a (phone or video) visit with your provider on (date) at (time).  Just as we do with many in-office visits, in order for you to participate in this visit, we must obtain consent.  If you'd like, I can send this to your mychart (if signed up) or email for you to review.  Otherwise, I can obtain your verbal consent now.  All virtual visits are billed to your insurance company just like a normal visit would be.  By agreeing to a virtual visit, we'd like you to understand that the technology does not allow for your provider to perform an examination, and thus may limit your provider's ability to fully assess your condition. If your provider identifies any concerns that need to be evaluated in person, we will make arrangements to do so.  Finally, though the technology is pretty good, we cannot assure that it will always work on either your or our end, and in the setting of a video visit, we may have to convert it to a phone-only visit.  In either situation, we cannot ensure that we have a secure connection.  Are you willing to proceed?" STAFF: Did the patient verbally acknowledge consent to telehealth visit? Document  YES/NO here: YES  4. Advise patient to be prepared - "Two hours prior to your appointment, go ahead and check your blood pressure, pulse, oxygen saturation, and your weight (if you have the equipment to check those) and write them all down. When your visit starts, your provider will ask you for this information. If you have an Apple Watch or Kardia device, please plan to have heart rate information ready on the day of your appointment. Please have a pen and paper handy nearby the day of the visit as well."  5. Give patient instructions for MyChart download to smartphone OR Doximity/Doxy.me as below if video visit (depending on what platform provider is using)  6. Inform patient they will receive a phone call 15 minutes prior to their appointment time (may be from unknown caller ID) so they should be prepared to answer    TELEPHONE CALL NOTE  Maurice Reed has been deemed a candidate for a follow-up tele-health visit to limit community exposure during the Covid-19 pandemic. I spoke with the patient via phone to ensure availability of phone/video source, confirm preferred email & phone number, and discuss instructions and expectations.  I reminded Maurice Reed to be prepared with any vital sign and/or heart rhythm information that could potentially be obtained via home monitoring, at the time of his visit. I reminded Maurice Reed to expect a phone call prior to  his visit.  Raelyn Number, CMA 03/26/2019 11:36 AM   INSTRUCTIONS FOR DOWNLOADING THE MYCHART APP TO SMARTPHONE  - The patient must first make sure to have activated MyChart and know their login information - If Apple, go to [employer] and type in MyChart in the search bar and download the app. If Android, ask patient to go to [employer] and type in [place] in the search bar and download the app. The app is free but as with any other app downloads, their phone may require them to verify saved payment information or  Apple/Android password.  - The patient will need to then log into the app with their MyChart username and password, and select [facility] as their healthcare provider to link the account. When it is time for your visit, go to the MyChart app, find appointments, and click Begin Video Visit. Be sure to Select Allow for your device to access the Microphone and Camera for your visit. You will then be connected, and your provider will be with you shortly.  **If they have any issues connecting, or need assistance please contact MyChart service desk [phone])**  **If using a computer, in order to ensure the best quality for their visit they will need to use either of the following Internet Browsers: [employer] or Google Chrome**  IF USING DOXIMITY or DOXY.ME - The patient will receive a link just prior to their visit by text.     FULL LENGTH CONSENT FOR TELE-HEALTH VISIT   I hereby voluntarily request, consent and authorize [facility] and its employed or contracted physicians, physician assistants, nurse practitioners or other licensed health care professionals (the Practitioner), to provide me with telemedicine health care services (the Services") as deemed necessary by the treating Practitioner. I acknowledge and consent to receive the Services by the Practitioner via telemedicine. I understand that the telemedicine visit will involve communicating with the Practitioner through live audiovisual communication technology and the disclosure of certain medical information by electronic transmission. I acknowledge that I have been given the opportunity to request an in-person assessment or other available alternative prior to the telemedicine visit and am voluntarily participating in the telemedicine visit.  I understand that I have the right to withhold or withdraw my consent to the use of telemedicine in the course of my care at any time, without affecting my right to future care  or treatment, and that the Practitioner or I may terminate the telemedicine visit at any time. I understand that I have the right to inspect all information obtained and/or recorded in the course of the telemedicine visit and may receive copies of available information for a reasonable fee.  I understand that some of the potential risks of receiving the Services via telemedicine include:   Delay or interruption in medical evaluation due to technological equipment failure or disruption;  Information transmitted may not be sufficient (e.g. poor resolution of images) to allow for appropriate medical decision making by the Practitioner; and/or   In rare instances, security protocols could fail, causing a breach of personal health information.  Furthermore, I acknowledge that it is my responsibility to provide information about my medical history, conditions and care that is complete and accurate to the best of my ability. I acknowledge that Practitioner's advice, recommendations, and/or decision may be based on factors not within their control, such as incomplete or inaccurate data provided by me or distortions of diagnostic images or specimens that may result from electronic transmissions.  I understand that the practice of medicine is not an exact science and that Practitioner makes no warranties or guarantees regarding treatment outcomes. I acknowledge that I will receive a copy of this consent concurrently upon execution via email to the email address I last provided but may also request a printed copy by calling the office of [facility].    I understand that my insurance will be billed for this visit.   I have read or had this consent read to me.  I understand the contents of this consent, which adequately explains the benefits and risks of the Services being provided via telemedicine.   I have been provided ample opportunity to ask questions regarding this consent and the Services and have had  my questions answered to my satisfaction.  I give my informed consent for the services to be provided through the use of telemedicine in my medical care  By participating in this telemedicine visit I agree to the above.       Cardiac Questionnaire:    Since your last visit or hospitalization:    1. Have you been having new or worsening chest pain? NO   2. Have you been having new or worsening shortness of breath? YES 3. Have you been having new or worsening leg swelling, wt gain, or increase in abdominal girth (pants fitting more tightly)? YES   4. Have you had any passing out spells? NO    *A YES to any of these questions would result in the appointment being kept. *If all the answers to these questions are NO, we should indicate that given the current situation regarding the worldwide coronarvirus pandemic, at the recommendation of the CDC, we are looking to limit gatherings in our waiting area, and thus will reschedule their appointment beyond four weeks from today.   _____________   COVID-19 Pre-Screening Questions:   Do you currently have a fever? NO  Have you recently travelled on a cruise, internationally, or to [place], [place], [place], [place], [place], or [place], [place] [employer]) ? NO  Have you been in contact with someone that is currently pending confirmation of Covid19 testing or has been confirmed to have the Covid19 virus? NO  Are you currently experiencing fatigue or cough? NO

## 2019-03-26 NOTE — Progress Notes (Signed)
Virtual Visit via Video Note   This visit type was conducted due to national recommendations for restrictions regarding the COVID-19 Pandemic (e.g. social distancing) in an effort to limit this patient's exposure and mitigate transmission in our community.  Due to his co-morbid illnesses, this patient is at least at moderate risk for complications without adequate follow up.  This format is felt to be most appropriate for this patient at this time.  All issues noted in this document were discussed and addressed.  A limited physical exam was performed with this format.  Please refer to the patient's chart for his consent to telehealth for [facility].   Evaluation Performed:  Follow-up visit  Date:  03/26/2019   ID:  Maurice Reed, DOB [birth year], MRN [id number]  Patient Location: Home Provider Location: Office  PCP:  Maurice Coke, PA  Cardiologist: Dr. Quay Reed Electrophysiologist:  None   Chief Complaint: 1 year follow-up shortness of breath and swelling  History of Present Illness:    Maurice Reed is a 78y.o. moderately overweight married Caucasian male (wife is Maurice Reed), father of 89, grandfather 20 grandchildren who is retired from working in the [employer].  He was referred by Maurice Coke, PA-C to be established in our practice because of prior cardiac history.  I last saw him in the office 07/04/2018. He has a greater than 100-pack-year history tobacco abuse having quit in 1985 and smoked 3 packs a day at that time.  He does have treated hypertension, obstructive sleep apnea on BiPAP.  He does wear oxygen nightly.  He has chronic [name] and a history of remote PE on Eliquis oral anticoagulation.  He is never had a heart attack or stroke.  He has chronic shortness of breath but denies chest pain.  Since I saw him close to a year ago he was seen in the ER in February for shortness of breath and some dizziness.  His work-up was unremarkable.  He was referred to an ENT  to rule out an inner ear etiology.  He is remained remarkably stable over the last year with blood pressures that run in the 120/80 range and a pulse of ranges from 70-85 and chronic [name].  He denies chest pain.  He does have lower extremity edema improved with torsemide and compression stockings.  The patient does not have symptoms concerning for COVID-19 infection (fever, chills, cough, or new shortness of breath).    Past Medical History:  Diagnosis Date  . Arthritis   . Atrial fibrillation ([address])   . 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 GERD (gastroesophageal reflux disease)   . Kidney stones   . Obesity    Past Surgical History:  Procedure Laterality Date  . APPENDECTOMY  1963  . LITHOTRIPSY  1987  . NOSE SURGERY  2003  . TONSILLECTOMY AND ADENOIDECTOMY  1948  . UMBILICAL HERNIA REPAIR  1984     Current Meds  Medication Sig  . apixaban (ELIQUIS) 5 MG TABS tablet TAKE 1 TABLET(5 MG) BY MOUTH TWICE DAILY  . Atropine Sulfate-NaCl 0.01-0.9 % SOLN Apply to eye.  . Cholecalciferol (VITAMIN D3) 3000 units TABS Take by mouth.  . clobetasol (TEMOVATE) 0.05 % external solution APPLY TO AFFECTED AREAS ON SCALP ONE TO TWO TIMES A DAY AS NEEDED  . desonide (DESOWEN) 0.05 % lotion Apply topically.  . docusate sodium (COLACE) 100 MG capsule Take by mouth.  . doxycycline (VIBRA-TABS) 100  MG tablet Take 1 tablet (100 mg total) by mouth daily.  Marland Kitchen erythromycin ophthalmic ointment APP THIN LAYER IN OS BID  . loratadine (CLARITIN) 10 MG tablet Take 10 mg by mouth daily.  Marland Kitchen MAGNESIUM-OXIDE 400 (241.3 Mg) MG tablet TAKE 1 TABLET BY MOUTH TWICE DAILY  . metoprolol succinate (TOPROL-XL) 25 MG 24 hr tablet TAKE 1 TABLET BY MOUTH  DAILY  . mometasone (NASONEX) 50 MCG/ACT nasal spray Place 2 sprays into the nose daily.  Marland Kitchen NARCAN 4 MG/0.1ML LIQD nasal spray kit INSTILL ONE SPRAY PRN FOR ACCIDENTAL OVERDOSE  . oxyCODONE (OXY IR/ROXICODONE) 5 MG immediate release tablet Take 5 mg  by mouth 2 (two) times daily as needed. for pain  . potassium chloride (MICRO-K) 10 MEQ CR capsule TAKE 1 CAPSULE BY MOUTH TWICE DAILY  . Tafluprost (ZIOPTAN) 0.0015 % SOLN Apply to eye.  . temazepam (RESTORIL) 15 MG capsule TAKE 1 CAPSULE BY MOUTH EVERY NIGHT AT BEDTIME AS NEEDED FOR SLEEP  . torsemide (DEMADEX) 20 MG tablet TAKE 1 TABLET BY MOUTH TWICE DAILY  . triamcinolone cream (KENALOG) 0.1 % APPLY AA BID  . [DISCONTINUED] albuterol (PROVENTIL HFA) 108 (90 Base) MCG/ACT inhaler Inhale into the lungs.     Allergies:   Other   Social History   Tobacco Use  . Smoking status: Former Smoker    Packs/day: 3.00    Years: 25.00    Pack years: 75.00    Types: Cigarettes    Last attempt to quit: 11/28/1984    Years since quitting: 34.3  . Smokeless tobacco: Never Used  Substance Use Topics  . Alcohol use: Never    Frequency: Never  . Drug use: Never     Family Hx: The patient's family history includes Arthritis in his father and mother; Hearing loss in his father and mother; Heart attack in his father; Heart disease in his father and mother; Hypertension in his father and mother; Kidney disease in his father; Stroke in his father and mother.  ROS:   Please see the history of present illness.     All other systems reviewed and are negative.   Prior CV studies:   The following studies were reviewed today:  None  Labs/Other Tests and Data Reviewed:    EKG:  An ECG dated 01/22/2019 was personally reviewed today and demonstrated:  Atrial fibrillation with a ventricular response of 78  Recent Labs: 07/04/2018: ALT 19 01/22/2019: BUN 16; Creatinine, Ser 0.98; Hemoglobin 14.7; Platelets 201; Potassium 3.9; Sodium 139   Recent Lipid Panel Lab Results  Component Value Date/Time   CHOL 201 (H) 07/04/2018 10:05 AM   TRIG 171 (H) 07/04/2018 10:05 AM   HDL 59 07/04/2018 10:05 AM   CHOLHDL 3.4 07/04/2018 10:05 AM   LDLCALC 108 (H) 07/04/2018 10:05 AM    Wt Readings from Last 3  Encounters:  03/26/19 255 lb 12.8 oz (116 kg)  02/14/19 257 lb (116.6 kg)  01/24/19 258 lb (117 kg)     Objective:    Vital Signs:  BP 122/84   Pulse (!) 59   Ht 5' 9.5" (1.765 m)   Wt 255 lb 12.8 oz (116 kg)   BMI 37.23 kg/m    VITAL SIGNS:  reviewed GEN:  no acute distress RESPIRATORY:  normal respiratory effort, symmetric expansion NEURO:  alert and oriented x 3, no obvious focal deficit PSYCH:  normal affect  ASSESSMENT & PLAN:    1. Chronic atrial fibrillation- rate controlled on Eliquis oral anticoagulation 2.  Essential hypertension- blood pressure 122/84 with a pulse of 70-80.  He is on metoprolol. 3.  Obstructive sleep apnea-on BiPAP 4.  Bilateral lower extremity edema- controlled on torsemide and compression stockings  COVID-19 Education: The signs and symptoms of COVID-19 were discussed with the patient and how to seek care for testing (follow up with PCP or arrange E-visit).  The importance of social distancing was discussed today.  Time:   Today, I have spent 12 minutes with the patient with telehealth technology discussing the above problems.     Medication Adjustments/Labs and Tests Ordered: Current medicines are reviewed at length with the patient today.  Concerns regarding medicines are outlined above.   Tests Ordered: No orders of the defined types were placed in this encounter.   Medication Changes: No orders of the defined types were placed in this encounter.   Disposition:  Follow up in 6 month(s)  Signed, Maurice Burow, MD  03/26/2019 1:39 PM    [facility]

## 2019-03-26 NOTE — Telephone Encounter (Signed)
Patient and/or DPR-approved person aware of AVS instructions and verbalized understanding. Letter including After Visit Summary and any other necessary documents mailed to the patient's address on file.  Pt wife states that pt has a form that he has to have multiple providers complete for the [facility] 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 She states she will bring the form by the office to be completed

## 2019-03-26 NOTE — Patient Instructions (Signed)
Medication Instructions:  Your physician recommends that you continue on your current medications as directed. Please refer to the Current Medication list given to you today.  If you need a refill on your cardiac medications before your next appointment, please call your pharmacy.   Lab work: NONE If you have labs (blood work) drawn today and your tests are completely normal, you will receive your results only by: Marland Kitchen MyChart Message (if you have MyChart) OR . A paper copy in the mail If you have any lab test that is abnormal or we need to change your treatment, we will call you to review the results.  Testing/Procedures: NONE  Follow-Up: At [facility], you and your health needs are our priority.  As part of our continuing mission to provide you with exceptional heart care, we have created designated Provider Care Teams.  These Care Teams include your primary Cardiologist (physician) and Advanced Practice Providers (APPs -  Physician Assistants and Nurse Practitioners) who all work together to provide you with the care you need, when you need it. . You will need a follow up appointment in 6 months with and APP and in 12 months with Dr. Allyson Sabal.  Please call our office 2 months in advance to schedule [facility] appointment.  You may see one of the following Advanced Practice Providers on your designated Care Team:   . Corine Shelter, [place] . Azalee Course, PA-C . Micah Flesher, PA-C . Joni Reining, DNP . Theodore Demark, PA-C . Judy Pimple, PA-C . Marjie Skiff, PA-C

## 2019-04-02 ENCOUNTER — Telehealth: Payer: Self-pay | Admitting: *Deleted

## 2019-04-02 NOTE — Telephone Encounter (Signed)
When pt calls back please schedule wife [name] for [employer] Visit too.

## 2019-04-02 NOTE — Telephone Encounter (Signed)
Left message on voicemail to call office. Need to schedule Annual Wellness visit with [facility].

## 2019-04-03 ENCOUNTER — Encounter: Payer: Self-pay | Admitting: Physician Assistant

## 2019-04-03 ENCOUNTER — Ambulatory Visit (INDEPENDENT_AMBULATORY_CARE_PROVIDER_SITE_OTHER): Payer: Medicare Other | Admitting: Physician Assistant

## 2019-04-03 DIAGNOSIS — Z0000 Encounter for general adult medical examination without abnormal findings: Secondary | ICD-10-CM | POA: Diagnosis not present

## 2019-04-03 NOTE — Progress Notes (Signed)
   Virtual Visit via Video   I connected with Maurice Reed on 04/03/19 at 11:00 AM EDT by a video enabled telemedicine application and verified that I am speaking with the correct person using two identifiers. Location patient: Home Location provider: [facility], Office Persons participating in the virtual visit: Maurice Reed, Maurice Reed, [name] and wife, Maurice Reed  I discussed the limitations of evaluation and management by telemedicine and the availability of in person appointments. The patient expressed understanding and agreed to proceed.  Subjective:   Patient here for Medicare annual wellness visit and management of other chronic and acute problems.  Risk factors: afib, blind left eye, obesity, pulmonary hypertension  Roster of Physicians Providing Medical Care to Patient: Patient Care Team: Jarold Motto, [name] as PCP - General (Physician Assistant) Oretha Milch, MD as Consulting Physician (Pulmonary Disease) Runell Gess, MD as Consulting Physician (Cardiology) [facility] - Pain Management  Activities of Daily Living  In your present state of health, do you have any difficulty performing the following activities? Preparing food and eating?: No  Bathing yourself: No  Getting dressed: No  Using the toilet:No  Moving around from place to place: No  In the past year have you fallen or had a near fall?:No     Home Safety: Has smoke detector and wears seat belts. No firearms. No excess sun exposure.  Diet and Exercise  Current exercise habits: walks as able, limited due to pulmonary HTN Dietary issues discussed: healthy diet   Depression Screen  (Note: if answer to either of the following is "Yes", then a more complete depression screening is indicated)  Q1: Over the past two weeks, have you felt down, depressed or hopeless?no  Q2: Over the past two weeks, have you felt little interest or pleasure in doing things? no   The following portions of the  patient's history were reviewed and updated as appropriate: allergies, current medications, past family history, past medical history, past social history, past surgical history and problem list.   Objective:   Vision: see nursing Hearing: able to hear forced whisper at 6 feet Body mass index: Cognitive Impairment Assessment: cognition, memory and judgment appear normal.   Assessment:   Medicare wellness utd on preventive parameters   Mini-Cog - 04/03/19 1213    Normal clock drawing test?  yes    How many words correct?  3        Plan:    During the course of the visit the patient was educated and counseled about appropriate screening and preventive services including:        Fall prevention   Screening mammography  Bone densitometry screening  Diabetes screening  Nutrition counseling   Vaccines / LABS   Patient Instructions (the written plan) was given to the patient.   I discussed the assessment and treatment plan with the patient. The patient was provided an opportunity to ask questions and all were answered. The patient agreed with the plan and demonstrated an understanding of the instructions.   The patient was advised to call back or seek an in-person evaluation if any symptoms develop, worsen or if the condition fails to improve as anticipated.  I provided 20 minutes of non-face-to-face time during this encounter.

## 2019-04-23 ENCOUNTER — Other Ambulatory Visit: Payer: Self-pay | Admitting: Physician Assistant

## 2019-04-24 ENCOUNTER — Telehealth: Payer: Self-pay | Admitting: Physician Assistant

## 2019-04-24 NOTE — Telephone Encounter (Signed)
Copied from CRM [id number]. Topic: Quick Communication - See Telephone Encounter >> Apr 24, 2019  4:26 PM Lorayne Bender wrote: CRM for notification. See Telephone encounter for: 04/24/19.  Pt's wife is calling to make sure that forms for pt to continue driving reached Maurice Reed and wants to know when those will be returned to [place].  Pt's wife states they are due in [place] by May 09, 2019. Wife, [name], can be reached at [phone]

## 2019-04-25 NOTE — Telephone Encounter (Signed)
See note

## 2019-04-25 NOTE — Telephone Encounter (Signed)
Spoke to pt and his wife, told them I have not received forms to be filled out for [facility]. Gave fax # and pt's wife said she may have someone bring them by instead of faxing. Told her that is fine I will keep an eye out for the forms.

## 2019-05-01 ENCOUNTER — Other Ambulatory Visit: Payer: Self-pay | Admitting: *Deleted

## 2019-05-01 ENCOUNTER — Telehealth: Payer: Self-pay | Admitting: *Deleted

## 2019-05-01 MED ORDER — TEMAZEPAM 15 MG PO CAPS: 15.0000 mg | ORAL_CAPSULE | Freq: Every evening | ORAL | 1 refills | Status: DC | PRN

## 2019-05-01 NOTE — Telephone Encounter (Signed)
Form updated with date and placed in berry's box. He asked to be notified once faxed.

## 2019-05-01 NOTE — Telephone Encounter (Signed)
Pt requesting refill be sent to [facility]. Please send

## 2019-05-01 NOTE — Telephone Encounter (Signed)
Pt called back with a date of when his afib started. He stated he was first diagnosed in 2004.

## 2019-05-01 NOTE — Telephone Encounter (Signed)
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 The patient has a hx of chronic a fib and will have to call back and let us know the onset for the DOT form. Form completed except for the onset date of a fib and placed in dr berry's box for signing the next time he is in the office.

## 2019-05-02 NOTE — Telephone Encounter (Signed)
Spoke to pt told him I received papers for [facility], Samantha filled them out and they were faxed over to [facility]. Pt verbalized understanding. I have put original paper work in the mail for you, any problems please let me know. Pt verbalized understanding.

## 2019-05-17 NOTE — Telephone Encounter (Signed)
Form faxed to [facility] on 6/12 and emailed to [email or web address]

## 2019-05-19 ENCOUNTER — Other Ambulatory Visit: Payer: Self-pay | Admitting: Physician Assistant

## 2019-05-23 ENCOUNTER — Telehealth: Payer: Self-pay

## 2019-05-23 NOTE — Telephone Encounter (Signed)
Received call at 3:57 pm from [phone] office number? Pt wife calling nurse stating that DMV did not receive fax that was sent on 6/12 nor did pt receive email. Informed wife that nurse would locate form.   Form located along with Memory Transmission Report from medical records fax machine stating "Successful Tx Notice" for 05/17/2019 at 18:27. Will make form available for pickup at office instead and contact pt to pickup

## 2019-05-24 ENCOUNTER — Telehealth: Payer: Self-pay

## 2019-05-24 NOTE — Telephone Encounter (Signed)
Contacted pt wife at [phone] and informed that fax was sent to the number provided on form [phone]) and nurse received successful transmission report from fax machine confirming it was sent. Pt wife would like a copy of information to be mailed to pt address [address], [place]. Copy mailed on 6/19

## 2019-06-01 ENCOUNTER — Other Ambulatory Visit: Payer: Self-pay | Admitting: Physician Assistant

## 2019-08-15 ENCOUNTER — Other Ambulatory Visit: Payer: Self-pay | Admitting: *Deleted

## 2019-08-15 ENCOUNTER — Telehealth: Payer: Self-pay | Admitting: Pulmonary Disease

## 2019-08-15 MED ORDER — DOXYCYCLINE HYCLATE 100 MG PO TABS: 100.0000 mg | ORAL_TABLET | Freq: Every day | ORAL | 1 refills | Status: DC

## 2019-08-15 NOTE — Telephone Encounter (Signed)
Needs to go to attn:  [place].

## 2019-08-15 NOTE — Telephone Encounter (Signed)
Returned call to wife, Maurice Reed (on dpr).  Wife states she needs a letter for [facility] to answer what DMV is asking: 'Does patient need to have oxygen while driving' due to patient's respiratory conditions.    Wife states patient wears O2 at 2L most of the time, which includes driving but in the past was told by [place] he did not have to wear it unless it was needed.  For Dx: emphysema she states  Requesting a letter to be faxed to [name] to state ' it is safe for the patient to drive with oxygen' OR 'it is safe for the patient to drive without oxygen.'  DMV just needs a statement from our office to confirm our recommendations.    LOV w/[name]  02/14/19 Problem: Other secondary pulmonary hypertension (HCC)  Editor: Rigoberto Noel, MD (Physician)    Likely secondary although he does have prior history of PE Not a candidate for vasodilator therapy     Problem: OSA treated with BiPAP  Editor: Rigoberto Noel, MD (Physician)    Compliant and this is helping. Continue BiPAP current settings  Weight loss encouraged, compliance with goal of at least 4-6 hrs every night is the expectation. 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  No obstruction on PFTs. Okay to discontinue Anoro     Refill on Nasonex.   Okay to take Zyrtec daily during spring. Okay to stop taking Anoro. Schedule high-resolution CT chest without contrast to follow-up on pleural plaques   Route to T. Parrett, NP for review and recommendations in Dr. Bari Mantis absence.

## 2019-08-15 NOTE — Telephone Encounter (Signed)
Per TP: I'm sorry, but it would best for him to come in for an office visit.  He hasn't been seen since March 2020 and there was no mention of daytime O2 at that time.  The last mention of it was in Beth's note from February.  Please schedule appt with any app to discuss O2 and hopefully get this taken care of.  Thanks.

## 2019-08-15 NOTE — Progress Notes (Signed)
'@Patient'  ID: Maurice Reed, male    DOB: June 12, 1941, 78 y.o.   MRN: [id number]  Chief Complaint  Patient presents with  . office visit    Trying to keep DMV from taking license O2'@2L'  when needed DME [name]    Referring provider: [place], Inda Reed  HPI:  78 year old remote smoker referred to our office on 05/29/2018 to be established with local pulmonologist for management of COPD as well as OSA.  On BiPAP.  Also using nighttime oxygen.  Patient has also been diagnosed with asbestosis. PMH: History of DVT and PE in 2002, 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  More than 50 pack years.  08/16/2019  - Visit   78 year old male former smoker followed in our office for obstructive sleep apnea managed on BiPAP.  Patient is adherent to BiPAP therapy.  BiPAP compliance report listed below:  07/17/2019-08/15/2019- 30 had a last 30 days use, all 30 those days greater than 4 hours, average usage 9 hours and 47 minutes, IPAP 14, EPAP 8, AHI 0.3  Patient was also scheduled to complete a high-resolution CT of his chest back in March 2020.  Unfortunately this was not completed as patient has had multiple family deaths over the past couple of months.  He recently lost his son, his daughter-in-law as well as his mother.  This is taken his primary focus.  He reports today that he is ready to complete the high-resolution CT scan if we can get him scheduled for it.  He is planning on receiving his flu shot later on this month by primary care.   Patient's primary concern for scheduling appointment today is to receive documentation for the DMV that he does need to wear oxygen while he is driving.  Patient's walk in office today reveals that he does not need oxygen with physical activity.  Patient reports though that he does wear oxygen specifically when it is humid or hot outside.  Patient checks his  oxygen levels at home as well as his heart rate and will place oxygen on based off those results.  Patient's oxygen levels today are stable on room air.   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 Features of asbestosis areNOTidentified.   01/2018 titration >>bipap 18/12   11/2017 spiro ratio 86, FEV 1 45%, FVC 39 % , severe restriction ABG 12/2017 7.42/45/82/ 97% RA  PFT 02/2019 no airway obstruction, ratio 86, FEV1 60%, F VC 50%, no bronchodilator response, TLC 48%, DLCO 66% , corrects for alveolar volume-moderate intraparenchymal restriction  Echo 07/2018 RVSP 57  SIX MIN WALK 08/16/2019 01/24/2019 05/29/2018  Supplimental Oxygen during Test? (L/min) No No No  Tech Comments: Walked at moderate pace tolerated with no c/o of dizziness or shorness of breath. Discontinued walk due to elevated heart rate. moderate SOB upon return to room fast paced walk no SOb noted.     FENO:  No results found for: NITRICOXIDE  PFT: PFT Results Latest Ref Rng & Units 02/14/2019  FVC-Pre L 2.05  FVC-Predicted Pre % 50  FVC-Post L 1.95  FVC-Predicted Post % 47  Pre FEV1/FVC % % 86  Post FEV1/FCV % % 86  FEV1-Pre L 1.76  FEV1-Predicted Pre % 60  FEV1-Post L 1.69  DLCO UNC% % 66  DLCO COR %Predicted % 146  TLC L 3.37  TLC % Predicted % 48  RV % Predicted % 56    Imaging: No results found.    Specialty Problems      Pulmonary Problems   OSA treated with BiPAP    01/23/2018 titration >> bipap 18/12  >>> controlled on 14/8 on download       Pulmonary emphysema Maurice Reed)    Former smoker 1958-85. Worked in [employer] all his life. CT on Jan 2019: 1. 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 Features of asbestosis are not identified.   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 Other Itching    Cats: watery eyes, itching, sneezing    Immunization History  Administered Date(s) Administered  . Influenza, High Dose Seasonal PF 09/21/2018  . Influenza, Seasonal, Injecte, Preservative Fre 09/28/2017  . Pneumococcal Polysaccharide-23 12/14/2015   Receiving flu vaccine in fall 2020  Past Medical History:  Diagnosis Date  . Arthritis   . Atrial fibrillation ([place])   . 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 GERD (gastroesophageal reflux disease)   . Kidney stones   . Obesity     Tobacco History: Social History   Tobacco Use  Smoking Status Former Smoker  . Packs/day: 3.00  . Years: 25.00  . Pack years: 75.00  . Types: Cigarettes  . Quit date: 11/28/1984  . Years since quitting: 34.7  Smokeless Tobacco Never Used   Counseling given: Yes   Continue to not smoke  Outpatient Encounter Medications as of 08/16/2019  Medication Sig  . Atropine Sulfate-NaCl 0.01-0.9 % SOLN Apply 1 drop to eye at bedtime.   . Cholecalciferol (VITAMIN D3) 3000 units TABS Take by mouth.  . clobetasol (TEMOVATE) 0.05 % external solution APPLY TO AFFECTED AREAS ON SCALP ONE TO TWO TIMES A DAY AS NEEDED  . desonide (DESOWEN) 0.05 % lotion Apply topically.  . docusate sodium (COLACE) 100 MG capsule Take 100 mg by mouth daily.   Marland Kitchen doxycycline (VIBRA-TABS) 100 MG tablet Take 1 tablet (100 mg total) by mouth daily.  Marland Kitchen ELIQUIS 5 MG TABS tablet TAKE 1 TABLET BY MOUTH  TWICE A DAY  . erythromycin ophthalmic ointment APP THIN LAYER IN OS BID  . MAGNESIUM-OXIDE 400 (241.3 Mg) MG tablet TAKE 1 TABLET BY MOUTH TWICE DAILY  . metoprolol succinate (TOPROL-XL) 25 MG 24 hr tablet TAKE 1 TABLET BY MOUTH  DAILY  . mometasone (NASONEX) 50 MCG/ACT nasal spray Place 2 sprays into the nose daily. (Patient taking  differently: Place 2 sprays into the nose as needed. )  . NARCAN 4 MG/0.1ML LIQD nasal spray kit INSTILL ONE SPRAY PRN FOR ACCIDENTAL OVERDOSE  . oxyCODONE (OXY IR/ROXICODONE) 5 MG immediate release tablet Take 5 mg by mouth 2 (two) times daily as needed. for pain  . potassium chloride (MICRO-K) 10 MEQ CR capsule TAKE 1 CAPSULE BY MOUTH TWICE DAILY  . Tafluprost (ZIOPTAN) 0.0015 % SOLN Apply 1 drop to eye at bedtime. Right eye  . tamsulosin (FLOMAX) 0.4 MG CAPS capsule TAKE 1 CAPSULE BY MOUTH AT  BEDTIME  . temazepam (RESTORIL) 15 MG capsule Take 1 capsule (15 mg total) by mouth at bedtime as needed for sleep.  Marland Kitchen torsemide (DEMADEX)  20 MG tablet TAKE 1 TABLET BY MOUTH TWICE DAILY  . triamcinolone cream (KENALOG) 0.1 % APPLY AA BID   No facility-administered encounter medications on file as of 08/16/2019.      Review of Systems  Review of Systems  Constitutional: Negative for activity change, chills, fatigue, fever and unexpected weight change.  HENT: Negative for postnasal drip, rhinorrhea, sinus pressure, sinus pain and sore throat.   Eyes: Negative.   Respiratory: Negative for cough, shortness of breath and wheezing.   Cardiovascular: Negative for chest pain and palpitations.  Gastrointestinal: Negative for constipation, diarrhea, nausea and vomiting.  Endocrine: Negative.   Genitourinary: Negative.   Musculoskeletal: Negative.   Skin: Negative.   Neurological: Negative for dizziness and headaches.  Psychiatric/Behavioral: Negative.  Negative for dysphoric mood. The patient is not nervous/anxious.   All other systems reviewed and are negative.    Physical Exam  BP 124/70 (BP Location: Left Arm, Cuff Size: Normal)   Pulse 95   Temp 97.6 F (36.4 C) (Oral)   Ht '5\' 9"'  (1.753 m)   Wt 267 lb (121.1 kg)   SpO2 100%   BMI 39.43 kg/m   Wt Readings from Last 5 Encounters:  08/16/19 267 lb (121.1 kg)  03/26/19 255 lb 12.8 oz (116 kg)  02/14/19 257 lb (116.6 kg)  01/24/19 258 lb  (117 kg)  01/22/19 257 lb (116.6 kg)     Physical Exam Vitals signs and nursing note reviewed.  Constitutional:      General: He is not in acute distress.    Appearance: Normal appearance. He is obese.  HENT:     Head: Normocephalic and atraumatic.     Right Ear: Hearing, tympanic membrane, ear canal and external ear normal.     Left Ear: Hearing, tympanic membrane, ear canal and external ear normal.     Nose: Nose normal. No mucosal edema or rhinorrhea.     Right Turbinates: Not enlarged.     Left Turbinates: Not enlarged.     Mouth/Throat:     Mouth: Mucous membranes are dry.     Pharynx: Oropharynx is clear. No oropharyngeal exudate.  Eyes:     Pupils: Pupils are equal, round, and reactive to light.  Neck:     Musculoskeletal: Normal range of motion.  Cardiovascular:     Rate and Rhythm: Normal rate. Rhythm regularly irregular.     Pulses: Normal pulses.     Heart sounds: No murmur.  Pulmonary:     Effort: Pulmonary effort is normal.     Breath sounds: Normal breath sounds. No decreased breath sounds, wheezing or rales.  Abdominal:     General: Abdomen is flat. Bowel sounds are normal.     Palpations: Abdomen is soft.  Musculoskeletal:     Right lower leg: No edema.     Left lower leg: No edema.  Lymphadenopathy:     Cervical: No cervical adenopathy.  Skin:    General: Skin is warm and dry.     Capillary Refill: Capillary refill takes less than 2 seconds.     Findings: No erythema or rash.  Neurological:     General: No focal deficit present.     Mental Status: He is alert and oriented to person, place, and time.     Motor: No weakness.     Coordination: Coordination normal.     Gait: Gait is intact. Gait normal.  Psychiatric:        Mood and Affect: Mood normal.  Behavior: Behavior normal. Behavior is cooperative.        Thought Content: Thought content normal.        Judgment: Judgment normal.      Lab Results:  CBC    Component Value Date/Time    WBC 7.9 01/22/2019 1405   RBC 4.29 01/22/2019 1405   HGB 14.7 01/22/2019 1405   HCT 45.0 01/22/2019 1405   PLT 201 01/22/2019 1405   MCV 104.9 (H) 01/22/2019 1405   MCH 34.3 (H) 01/22/2019 1405   MCHC 32.7 01/22/2019 1405   RDW 12.8 01/22/2019 1405    BMET    Component Value Date/Time   NA 139 01/22/2019 1405   K 3.9 01/22/2019 1405   CL 101 01/22/2019 1405   CO2 29 01/22/2019 1405   GLUCOSE 119 (H) 01/22/2019 1405   BUN 16 01/22/2019 1405   CREATININE 0.98 01/22/2019 1405   CALCIUM 9.6 01/22/2019 1405   GFRNONAA >60 01/22/2019 1405   GFRAA >60 01/22/2019 1405    BNP No results found for: BNP  ProBNP No results found for: PROBNP    Assessment & Plan:   OSA treated with BiPAP Plan:  Continue BiPAP  Continue Oxygen   Pulmonary emphysema (HCC) Plan: Continue to increase daily physical activity Receive flu vaccine later on this month at primary care office We will mail a letter to the [facility] stating that you do need oxygen while you drive  I recommend that you continue to check your oxygen levels routinely throughout the day.  If oxygen levels drop below 90% that you need to place oxygen on.  While you are driving please continue to wear oxygen.  If you become symptomatic such as short of breath, dizzy, lightheaded then you will need to pull over.  Continue to wear your BiPAP as well.  If oxygen levels are routinely below 90% despite oxygen therapy you need to contact our office.  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 [name].   Lauraine Rinne, NP 08/16/2019   This appointment was 30 minutes long with over 50% of the time in direct  face-to-face patient care, assessment, plan of care, and follow-up.

## 2019-08-15 NOTE — Telephone Encounter (Signed)
LVM for patient spouse on her cell # [phone] advising of response received and to call our office to schedule appointment.  Message left in triage as fyi when return call has been received.

## 2019-08-15 NOTE — Telephone Encounter (Signed)
Per skype message received from [name], Maurice Reed (spouse) received the vocemail. Called back appointment to be made. Nothing further needed at this time.

## 2019-08-15 NOTE — Telephone Encounter (Signed)
Requesting ASAP

## 2019-08-16 ENCOUNTER — Ambulatory Visit: Payer: Medicare Other | Admitting: Pulmonary Disease

## 2019-08-16 ENCOUNTER — Other Ambulatory Visit: Payer: Self-pay

## 2019-08-16 ENCOUNTER — Encounter: Payer: Self-pay | Admitting: Pulmonary Disease

## 2019-08-16 VITALS — BP 124/70 | HR 95 | Temp 97.6°F | Ht 69.0 in | Wt 267.0 lb

## 2019-08-16 DIAGNOSIS — J929 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: Secondary | ICD-10-CM | POA: Diagnosis not present

## 2019-08-16 DIAGNOSIS — Z0000 Encounter for general adult medical examination without abnormal findings: Secondary | ICD-10-CM | POA: Diagnosis not present

## 2019-08-16 DIAGNOSIS — G4733 Obstructive sleep apnea (adult) (pediatric): Secondary | ICD-10-CM

## 2019-08-16 DIAGNOSIS — J432 Centrilobular emphysema: Secondary | ICD-10-CM | POA: Diagnosis not present

## 2019-08-16 DIAGNOSIS — I2699 Other pulmonary embolism without acute cor pulmonale: Secondary | ICD-10-CM

## 2019-08-16 NOTE — Assessment & Plan Note (Signed)
Plan: We will get patient scheduled for high-resolution CT chest that was ordered in March/2020

## 2019-08-16 NOTE — Assessment & Plan Note (Signed)
Plan: Continue Eliquis 

## 2019-08-16 NOTE — Patient Instructions (Addendum)
You were seen today by Lauraine Rinne, NP  for:   1. OSA treated with BiPAP  We recommend that you continue using your BiPAP daily >>>Keep up the hard work using your device >>> Goal should be wearing this for the entire night that you are sleeping, at least 4 to 6 hours  Remember:  . Do not drive or operate heavy machinery if tired or drowsy.  . Please notify the supply company and office if you are unable to use your device regularly due to missing supplies or machine being broken.  . Work on maintaining a healthy weight and following your recommended nutrition plan  . Maintain proper daily exercise and movement  . Maintaining proper use of your device can also help improve management of other chronic illnesses such as: Blood pressure, blood sugars, and weight management.   BiPAP/ CPAP Cleaning:  >>>Clean weekly, with Dawn soap, and bottle brush.  Set up to air dry.   2. Centrilobular emphysema ([place])  Continue oxygen therapy as prescribed  >>>maintain oxygen saturations greater than 88 percent  >>>if unable to maintain oxygen saturations please contact the office  >>>do not smoke with oxygen  >>>can use nasal saline gel or nasal saline rinses to moisturize nose if oxygen causes dryness  Received flu vaccine this month at primary care  Increase daily physical activity  We will document a letter stating that you need to wear oxygen while driving and mail this to the [facility] per your request.  We will mail you a copy of the letter as well as the fax confirmation that we have sent it.  3. 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 Healthcare maintenance  Received flu vaccine later on this month at primary care office   Follow Up:    Return in about 6 months (around 02/13/2020), or if symptoms worsen or fail to improve, for Follow up with Dr. [name].   Please do your part to reduce the  spread of COVID-19:      Reduce your risk of any infection  and COVID19 by using the similar precautions used for avoiding the common cold or flu:  Marland Kitchen Wash your hands often with soap and warm water for at least 20 seconds.  If soap and water are not readily available, use an alcohol-based hand sanitizer with at least 60% alcohol.  . If coughing or sneezing, cover your mouth and nose by coughing or sneezing into the elbow areas of your shirt or coat, into a tissue or into your sleeve (not your hands). Langley Gauss A MASK when in public  . Avoid shaking hands with others and consider head nods or verbal greetings only. . Avoid touching your eyes, nose, or mouth with unwashed hands.  . Avoid close contact with people who are sick. . Avoid places or events with large numbers of people in one location, like concerts or sporting events. . If you have some symptoms but not all symptoms, continue to monitor at home and seek medical attention if your symptoms worsen. . If you are having a medical emergency, call 911.   [place] / e-Visit: [email or web address]         [facility]: [facility]: [phone]  [facility]: [phone]     It is flu season:   >>> Best ways to protect herself from the flu: Receive the yearly flu vaccine, practice good hand hygiene washing with soap and also using hand sanitizer when available, eat a nutritious meals, get adequate rest, hydrate appropriately   Please contact the office if your symptoms worsen or you have concerns that you are not improving.   Thank you for choosing [facility] for your healthcare, and for allowing [place] to partner with you on your healthcare journey. I am thankful to be able to provide care to you today.   Wyn Quaker FNP-C

## 2019-08-16 NOTE — Assessment & Plan Note (Addendum)
Plan: Continue to increase daily physical activity Receive flu vaccine later on this month at primary care office We will mail a letter to the [facility] stating that you do need oxygen while you drive  I recommend that you continue to check your oxygen levels routinely throughout the day.  If oxygen levels drop below 90% that you need to place oxygen on.  While you are driving please continue to wear oxygen.  If you become symptomatic such as short of breath, dizzy, lightheaded then you will need to pull over.  Continue to wear your BiPAP as well.  If oxygen levels are routinely below 90% despite oxygen therapy you need to contact our office.

## 2019-08-16 NOTE — Assessment & Plan Note (Signed)
Plan:  Continue BiPAP  Continue Oxygen

## 2019-08-16 NOTE — Assessment & Plan Note (Signed)
Plan: Flu vaccine in fall at primary care office

## 2019-08-23 ENCOUNTER — Encounter: Payer: Self-pay | Admitting: Physician Assistant

## 2019-08-23 ENCOUNTER — Other Ambulatory Visit: Payer: Self-pay

## 2019-08-23 ENCOUNTER — Ambulatory Visit (INDEPENDENT_AMBULATORY_CARE_PROVIDER_SITE_OTHER): Payer: Medicare Other | Admitting: Physician Assistant

## 2019-08-23 VITALS — BP 122/76 | HR 83 | Temp 98.2°F | Ht 69.0 in | Wt 276.0 lb

## 2019-08-23 DIAGNOSIS — Z23 Encounter for immunization: Secondary | ICD-10-CM | POA: Diagnosis not present

## 2019-08-23 DIAGNOSIS — N529 Male erectile dysfunction, unspecified: Secondary | ICD-10-CM | POA: Diagnosis not present

## 2019-08-23 DIAGNOSIS — I10 Essential (primary) hypertension: Secondary | ICD-10-CM

## 2019-08-23 MED ORDER — TADALAFIL 20 MG PO TABS: 10.0000 mg | ORAL_TABLET | ORAL | 11 refills | Status: DC | PRN

## 2019-08-23 NOTE — Patient Instructions (Signed)
It was great to see you!  May take up to 20 mg of cialis.  Take care,  Inda Coke PA-C

## 2019-08-23 NOTE — Progress Notes (Signed)
Maurice Reed is a 78 y.o. male is here to discuss: Hypertension  I acted as a Education administrator for [employer], PA-C Anselmo Pickler, LPN  History of Present Illness:   Chief Complaint  Patient presents with   Hypertension    HPI   Hypertension Pt for 6 month follow up on blood pressure.  He is currently taking Toprol-XL 25 mg daily, torsemide 20 mg twice daily. He is monitoring blood pressure daily, averaging systolic 005-110, diastolic 21-11'N. Pt denies headaches, dizziness, blurred vision, chest pain, SOB. Denies excessive caffeine intake, stimulant usage, excessive alcohol intake or increase in salt consumption.  He is followed by cardiology, most recently seen via telemedicine on 03/26/2019.  ED Takes cialis prn. Needs refill. Typically takes 20 mg and it does "alright" for him.     Health Maintenance Due  Topic Date Due   INFLUENZA VACCINE  07/06/2019    Past Medical History:  Diagnosis Date   Arthritis    Atrial fibrillation ([place])    Blind left eye 2005   was a result of cataract surgery   GERD (gastroesophageal reflux disease)    Kidney stones    Obesity      Social History   Socioeconomic History   Marital status: Married    Spouse name: Not on file   Number of children: Not on file   Years of education: Not on file   Highest education level: Not on file  Occupational History   Not on file  Social Needs   Financial resource strain: Not on file   Food insecurity    Worry: Not on file    Inability: Not on file   Transportation needs    Medical: Not on file    Non-medical: Not on file  Tobacco Use   Smoking status: Former Smoker    Packs/day: 3.00    Years: 25.00    Pack years: 75.00    Types: Cigarettes    Quit date: 11/28/1984    Years since quitting: 34.7   Smokeless tobacco: Never Used  Substance and Sexual Activity   Alcohol use: Never    Frequency: Never   Drug use: Never   Sexual activity: Yes  Lifestyle    Physical activity    Days per week: Not on file    Minutes per session: Not on file   Stress: Not on file  Relationships   Social connections    Talks on phone: Not on file    Gets together: Not on file    Attends religious service: Not on file    Active member of club or organization: Not on file    Attends meetings of clubs or organizations: Not on file    Relationship status: Not on file   Intimate partner violence    Fear of current or ex partner: Not on file    Emotionally abused: Not on file    Physically abused: Not on file    Forced sexual activity: Not on file  Other Topics Concern   Not on file  Social History Narrative   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, [place]; all their things are in storage; plan to travel and stay with kids but keep their home base in the [place] area    Past Surgical History:  Procedure Laterality Date  APPENDECTOMY  1963   LITHOTRIPSY  1987   NOSE SURGERY  2003   TONSILLECTOMY AND ADENOIDECTOMY  5170   UMBILICAL HERNIA REPAIR  1984    Family History  Problem Relation Age of Onset   Arthritis Mother    Hearing loss Mother    Hypertension Mother    Heart disease Mother    Stroke Mother    Arthritis Father    Hearing loss Father    Heart disease Father    Hypertension Father    Heart attack Father    Kidney disease Father    Stroke Father     PMHx, SurgHx, SocialHx, FamHx, Medications, and Allergies were reviewed in the Visit Navigator and updated as appropriate.   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[place]) 05/22/2018   Insomnia 05/22/2018   Erectile dysfunction 05/22/2018   Obesity    Kidney stones    Essential hypertension    GERD (gastroesophageal reflux disease)    Former smoker    Arthritis    Spondylosis without myelopathy or radiculopathy, 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   OSA treated with BiPAP 06/17/2013   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   Atrial fibrillation ([place]) 06/17/2013   Rosacea 06/17/2013   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 , Disp: , Rfl:    Cholecalciferol (VITAMIN D3) 3000 units TABS, Take by mouth., Disp: , Rfl:    clobetasol (TEMOVATE) 0.05 % external solution, APPLY TO AFFECTED AREAS ON SCALP ONE TO TWO TIMES A DAY AS NEEDED, Disp: 150 mL, Rfl: 4   desonide (DESOWEN) 0.05 % lotion, Apply topically., Disp: , Rfl:    docusate sodium (COLACE) 100 MG capsule, Take 100 mg by mouth daily. , Disp: , Rfl:    doxycycline (VIBRA-TABS) 100 MG tablet, Take 1 tablet (100 mg total) by mouth daily., Disp: 90 tablet, Rfl: 1   ELIQUIS 5 MG TABS tablet, TAKE 1 TABLET BY MOUTH  TWICE A DAY, Disp: 180 tablet, Rfl: 1   erythromycin ophthalmic ointment, APP THIN LAYER IN OS BID, Disp: , Rfl: 1   MAGNESIUM-OXIDE 400 (241.3 Mg) MG tablet, TAKE 1 TABLET BY MOUTH TWICE DAILY, Disp: 60 tablet, Rfl: 2   metoprolol succinate (TOPROL-XL) 25 MG 24 hr  tablet, TAKE 1 TABLET BY MOUTH  DAILY, Disp: 90 tablet, Rfl: 1   mometasone (NASONEX) 50 MCG/ACT nasal spray, Place 2 sprays into the nose daily. (Patient taking differently: Place 2 sprays into the nose as needed. ), Disp: 51 g, Rfl: 2   NARCAN 4 MG/0.1ML LIQD nasal spray  kit, INSTILL ONE SPRAY PRN FOR ACCIDENTAL OVERDOSE, Disp: , Rfl: 0   oxyCODONE (OXY IR/ROXICODONE) 5 MG immediate release tablet, Take 5 mg by mouth 2 (two) times daily as needed. for pain, Disp: , Rfl: 0   potassium chloride (MICRO-K) 10 MEQ CR capsule, TAKE 1 CAPSULE BY MOUTH TWICE DAILY, Disp: 180 capsule, Rfl: 3   prednisoLONE acetate (PRED FORTE) 1 % ophthalmic suspension, Place 1 drop into the left eye 2 (two) times daily. , Disp: , Rfl:    Tafluprost (ZIOPTAN) 0.0015 % SOLN, Apply 1 drop to eye at bedtime. Right eye, Disp: , Rfl:    tamsulosin (FLOMAX) 0.4 MG CAPS capsule, TAKE 1 CAPSULE BY MOUTH AT  BEDTIME, Disp: 90 capsule, Rfl: 1   temazepam (RESTORIL) 15 MG capsule, Take 1 capsule (15 mg total) by mouth at bedtime as needed for sleep., Disp: 90 capsule, Rfl: 1   torsemide (DEMADEX) 20 MG tablet, TAKE 1 TABLET BY MOUTH TWICE DAILY, Disp: 180 tablet, Rfl: 0   triamcinolone cream (KENALOG) 0.1 %, APPLY AA BID, Disp: , Rfl: 0   tadalafil (CIALIS) 20 MG tablet, Take 0.5-1 tablets (10-20 mg total) by mouth every other day as needed for erectile dysfunction., Disp: 5 tablet, Rfl: 11   Allergies  Allergen Reactions   Other Itching    Cats: watery eyes, itching, sneezing    Review of Systems   ROS Negative unless otherwise specified per HPI.  Vitals:   Vitals:   08/23/19 1045  BP: 122/76  Pulse: 83  Temp: 98.2 F (36.8 C)  TempSrc: Temporal  SpO2: 96%  Weight: 276 lb (125.2 kg)  Height: '5\' 9"'  (1.753 m)     Body mass index is 40.76 kg/m.   Physical Exam:    Physical Exam Vitals signs and nursing note reviewed.  Constitutional:      General: He is not in acute distress.    Appearance: He  is well-developed. He is not ill-appearing or toxic-appearing.  Cardiovascular:     Rate and Rhythm: Normal rate. Rhythm irregularly irregular.     Pulses: Normal pulses.     Heart sounds: Normal heart sounds, S1 normal and S2 normal.     Comments: Wearing compression hose Pulmonary:     Effort: Pulmonary effort is normal.     Breath sounds: Normal breath sounds.  Skin:    General: Skin is warm and dry.  Neurological:     Mental Status: He is alert.     GCS: GCS eye subscore is 4. GCS verbal subscore is 5. GCS motor subscore is 6.  Psychiatric:        Speech: Speech normal.        Behavior: Behavior normal. Behavior is cooperative.      Assessment and Plan:    Doyle was seen today for hypertension.  Diagnoses and all orders for this visit:  Essential hypertension Controlled. Sees cardiology. Continue toprol and torsemide, has follow-up with them in Oct, per his report.  Erectile dysfunction, unspecified erectile dysfunction type Refill of cialis provided.  Need for immunization against influenza -     Flu Vaccine QUAD High Dose(Fluad)  Other orders -     tadalafil (CIALIS) 20 MG tablet; Take 0.5-1 tablets (10-20 mg total) by mouth every other day as needed for erectile dysfunction.     Reviewed expectations re: course of current medical issues.  Discussed self-management of symptoms.  Outlined signs and symptoms indicating need for more acute intervention.  Patient verbalized understanding  and all questions were answered.  See orders for this visit as documented in the electronic medical record.  Patient received an After Visit Summary.  CMA or LPN served as scribe during this visit. History, Physical, and Plan performed by medical provider. The above documentation has been reviewed and is accurate and complete.   Inda Coke, PA-C [place], [place] [facility] 08/23/2019  Follow-up: No follow-ups on file.

## 2019-08-27 ENCOUNTER — Other Ambulatory Visit: Payer: Self-pay | Admitting: Physician Assistant

## 2019-09-17 ENCOUNTER — Other Ambulatory Visit: Payer: Self-pay

## 2019-09-17 ENCOUNTER — Ambulatory Visit (INDEPENDENT_AMBULATORY_CARE_PROVIDER_SITE_OTHER)
Admission: RE | Admit: 2019-09-17 | Discharge: 2019-09-17 | Disposition: A | Discharge status: Home / Self Care | Payer: Medicare Other | Source: Ambulatory Visit | Attending: Pulmonary Disease | Admitting: Pulmonary Disease

## 2019-09-17 DIAGNOSIS — J929 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: Secondary | ICD-10-CM

## 2019-09-17 IMAGING — CT CT CHEST HIGH RESOLUTION W/O CM
2 of 5 series · 14 of 36 positions shown, 17 images · non-contrast
Comparison: No priors.

CLINICAL DATA: 78-year-old male history of asbestos exposure.
Evaluate for possible asbestosis.

EXAM:
CT CHEST WITHOUT CONTRAST
TECHNIQUE: Multidetector CT imaging of the chest was performed following the
standard protocol without intravenous contrast. High resolution
imaging of the lungs, as well as inspiratory and expiratory imaging,
was performed.

[Series 2: high resolution · axial · 0.83mm/px · z∈[-281,-19]mm · 11 of 145 slices shown, 14 images]
[im 7/145  mediastinal]
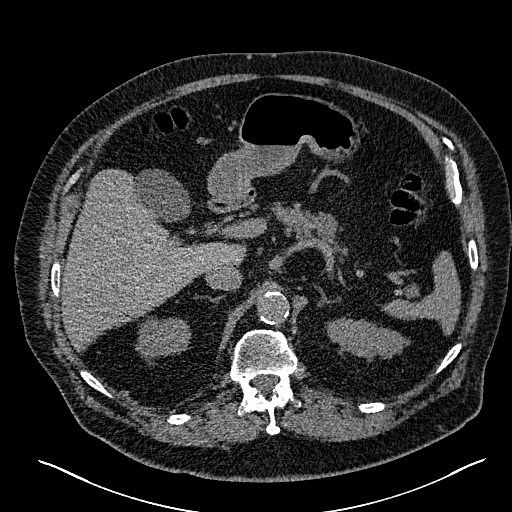
[im 7/145  lung]
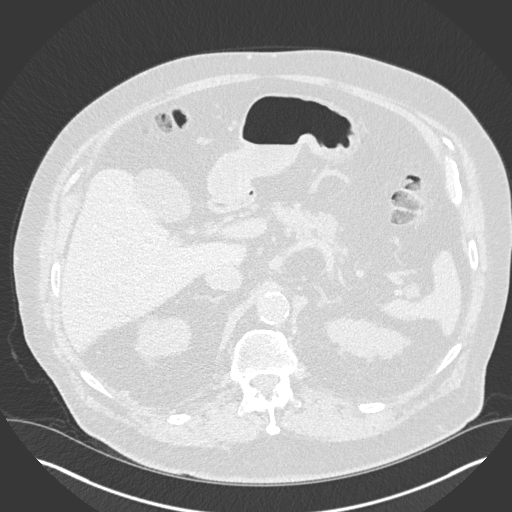
[im 20/145  lung]
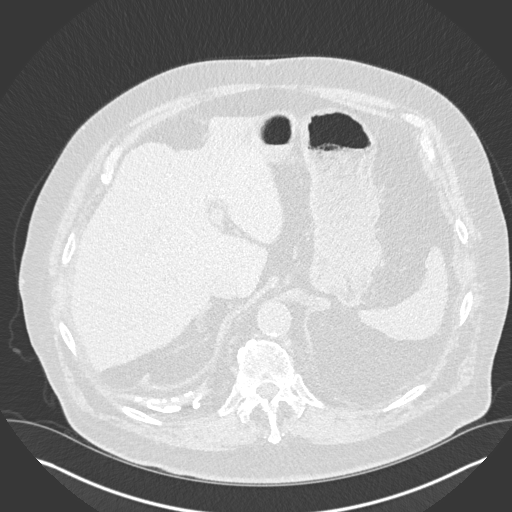
[im 33/145  lung]
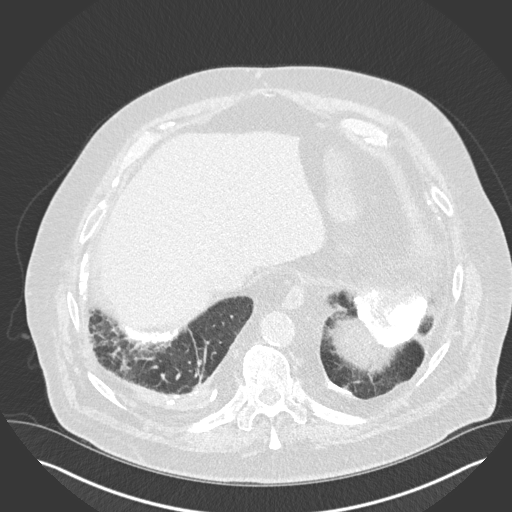
[im 46/145  lung]
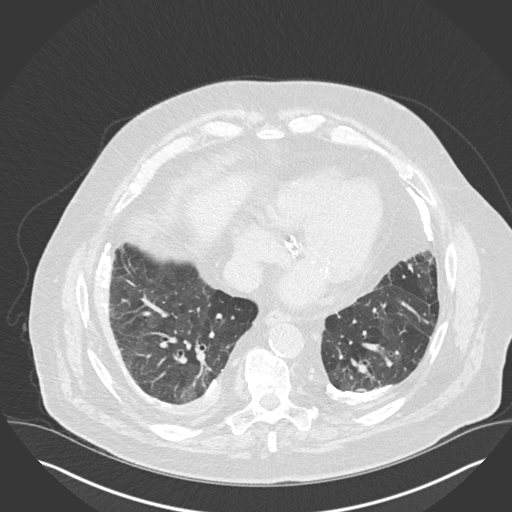
[im 59/145  mediastinal]
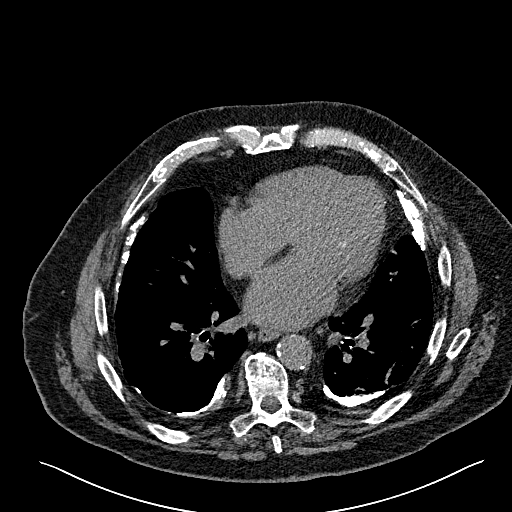
[im 59/145  lung]
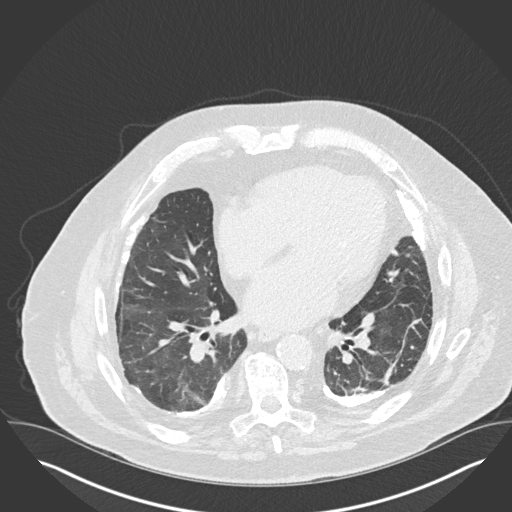
[im 73/145  lung]
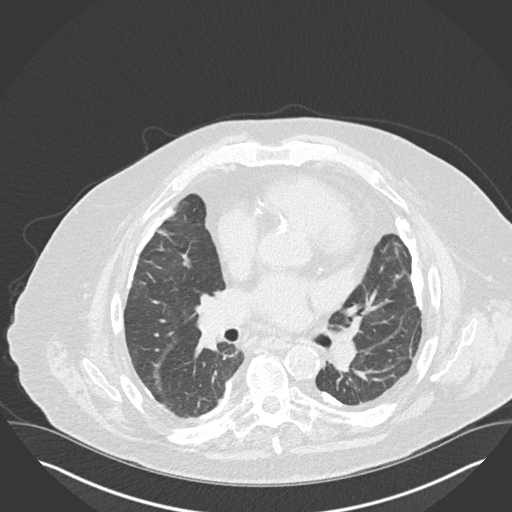
[im 86/145  lung]
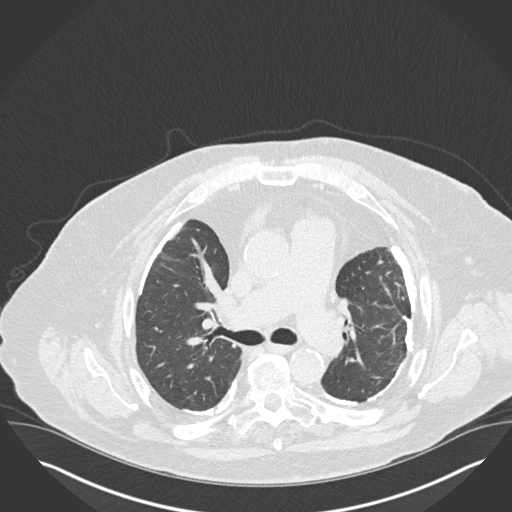
[im 99/145  lung]
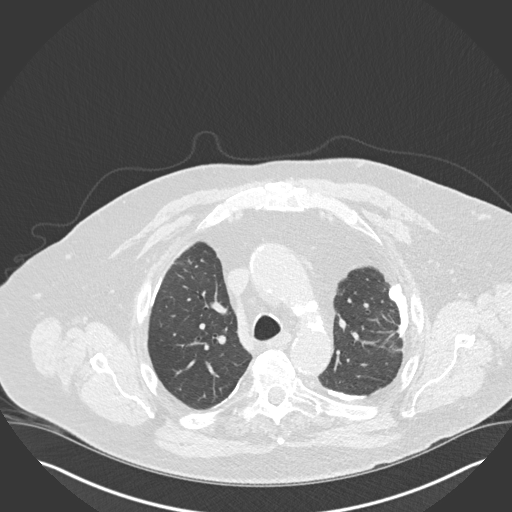
[im 112/145  mediastinal]
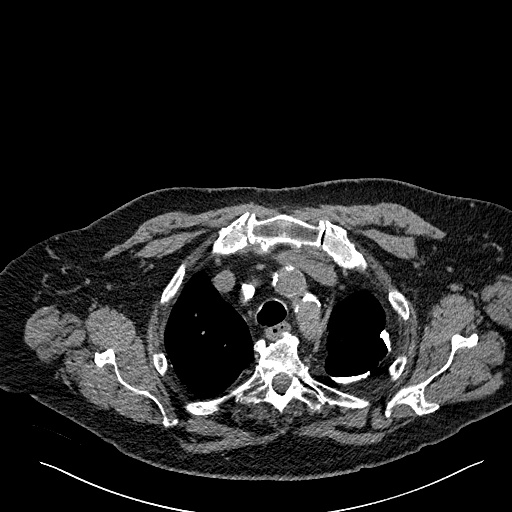
[im 112/145  lung]
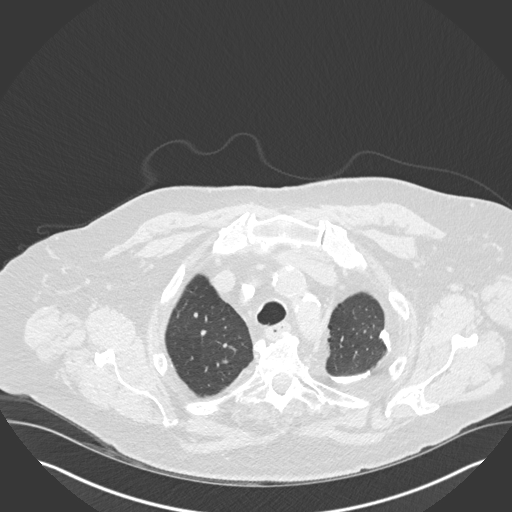
[im 125/145  lung]
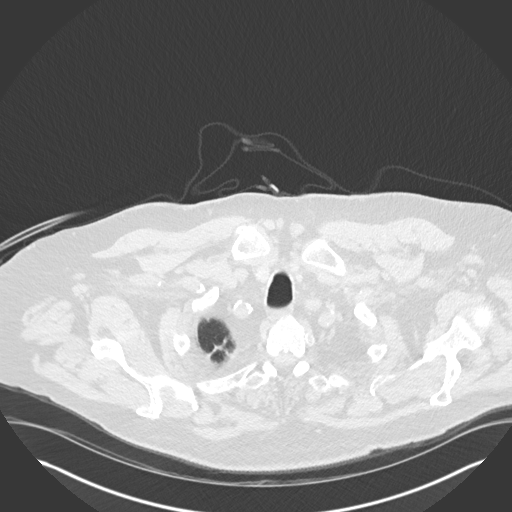
[im 138/145  lung]
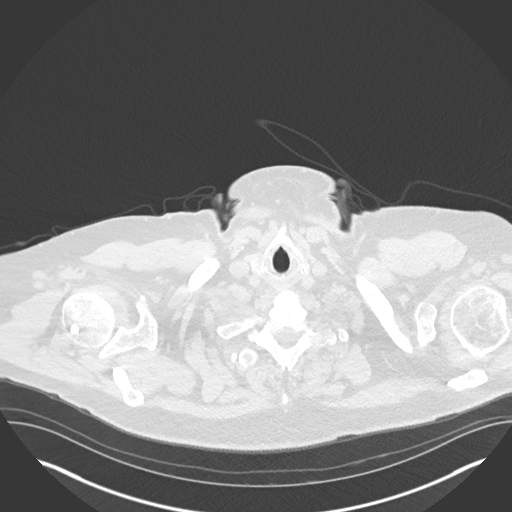

[Series 7: coronal · coronal · 0.59mm/px · 3 of 139 slices shown]
[im 28/139  lung]
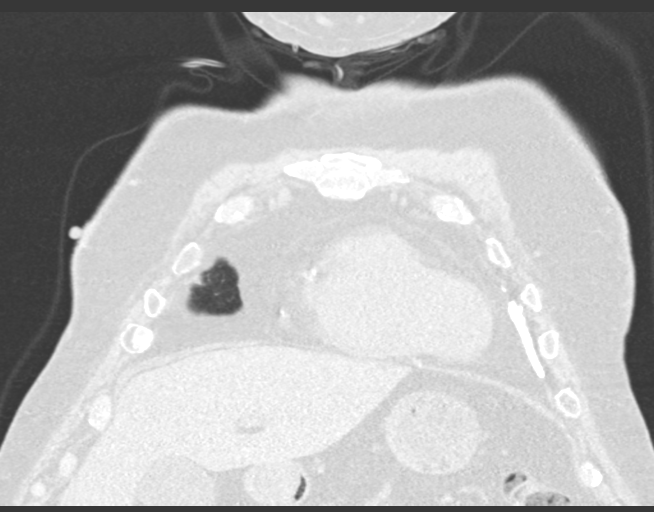
[im 56/139  lung]
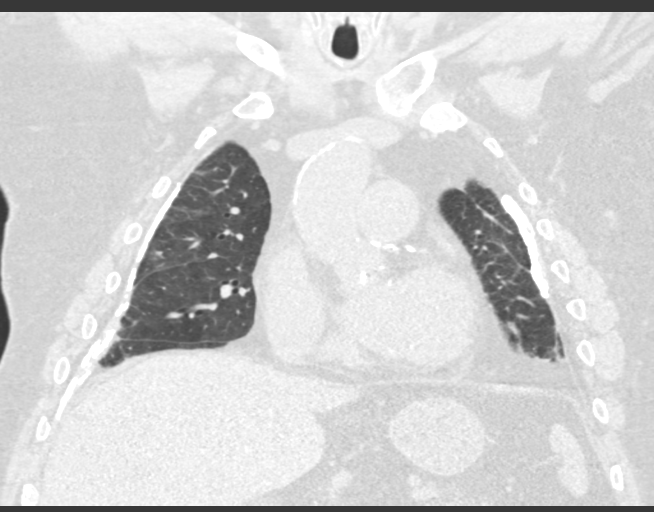
[im 83/139  lung]
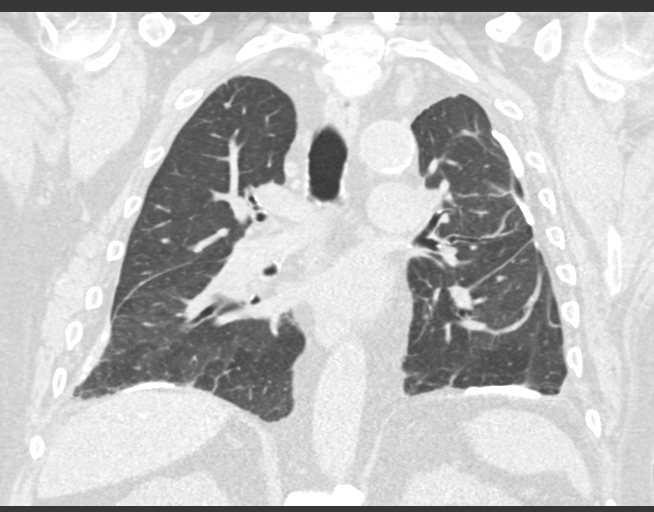

[14 of 36 positions shown; findings below may reference images not displayed]

FINDINGS: Cardiovascular: Heart size is mildly enlarged with left atrial
dilatation. There is no significant pericardial fluid, thickening or
pericardial calcification. There is aortic atherosclerosis, as well
as atherosclerosis of the great vessels of the mediastinum and the
coronary arteries, including calcified atherosclerotic plaque in the
left main, left anterior descending, left circumflex and right
coronary arteries. Calcifications of the aortic valve and mitral
annulus. Dilatation of the pulmonic trunk (3.7 cm in diameter).

Mediastinum/Nodes: No pathologically enlarged mediastinal or hilar
lymph nodes. Please note that accurate exclusion of hilar adenopathy
is limited on noncontrast CT scans. Esophagus is unremarkable in
appearance. No axillary lymphadenopathy.

Lungs/Pleura: Extensive calcified pleural plaques throughout the
thorax bilaterally, compatible with asbestos related pleural
disease. High-resolution images demonstrate patchy areas of
ground-glass attenuation, peripheral predominant septal thickening
and subpleural reticulation and areas of parenchymal banding. No
traction bronchiectasis or honeycombing. Inspiratory and expiratory
imaging demonstrates moderate air trapping indicative of small
airways disease. Severe compression of the trachea and mainstem
bronchi during expiration indicative of severe
tracheobronchomalacia. No acute consolidative airspace disease. No
pleural effusions. No definite suspicious appearing pulmonary
nodules or masses are noted.

Upper Abdomen: Aortic atherosclerosis.

Musculoskeletal: There are no aggressive appearing lytic or blastic
lesions noted in the visualized portions of the skeleton.
IMPRESSION: 1. Calcified pleural plaques bilaterally, compatible with asbestos
related pleural disease. In addition, there are findings compatible
with interstitial lung disease, with a spectrum of findings
considered indeterminate for usual interstitial pneumonia (UIP) per
current ATS guidelines. This likely represents a manifestation of
asbestosis.
2. Dilatation of the pulmonic trunk (3.7 cm in diameter), concerning
for associated pulmonary arterial hypertension.
3. Aortic atherosclerosis, in addition to left main and 3 vessel
coronary artery disease. Assessment for potential risk factor
modification, dietary therapy or pharmacologic therapy may be
warranted, if clinically indicated.
4. Cardiomegaly with left atrial dilatation.
5. There are calcifications of the aortic valve and mitral annulus.
Echocardiographic correlation for evaluation of potential valvular
dysfunction may be warranted if clinically indicated.

Aortic Atherosclerosis ([PR]-[PR]).

## 2019-09-18 ENCOUNTER — Other Ambulatory Visit: Payer: Self-pay

## 2019-09-18 ENCOUNTER — Encounter: Payer: Self-pay | Admitting: Cardiovascular Disease

## 2019-09-18 ENCOUNTER — Telehealth: Payer: Self-pay | Admitting: Pulmonary Disease

## 2019-09-18 ENCOUNTER — Ambulatory Visit: Payer: Medicare Other | Admitting: Cardiovascular Disease

## 2019-09-18 DIAGNOSIS — R600 Localized edema: Secondary | ICD-10-CM

## 2019-09-18 DIAGNOSIS — G4733 Obstructive sleep apnea (adult) (pediatric): Secondary | ICD-10-CM

## 2019-09-18 DIAGNOSIS — I2699 Other pulmonary embolism without acute cor pulmonale: Secondary | ICD-10-CM | POA: Diagnosis not present

## 2019-09-18 DIAGNOSIS — I10 Essential (primary) hypertension: Secondary | ICD-10-CM

## 2019-09-18 DIAGNOSIS — I4811 Longstanding persistent atrial fibrillation: Secondary | ICD-10-CM | POA: Diagnosis not present

## 2019-09-18 NOTE — Progress Notes (Signed)
09/18/2019 Maurice Reed   [birth year]  [id number]  Primary Physician Maurice Reed, [place] Primary Cardiologist: Maurice Harp MD Maurice Reed, [name]  HPI:  Maurice Reed is a 78 y.o.  moderately overweight married Caucasian male (wife is Maurice Reed), father of 32, grandfather 29 grandchildren who is retired from working in the [employer]. He was referred by Maurice Coke, PA-C to be established in our practice because of prior cardiac history.  I last saw him virtually via video telemedicine visit 03/26/2019. He has a greater than 100-pack-year history tobacco abuse having quit in 1985 and smoked 3 packs a day at that time. He does have treated hypertension, obstructive sleep apnea on BiPAP. He does wear oxygen nightly. He has chronic [name] and a history of remote PE on Eliquis oral anticoagulation. He is never had a heart attack or stroke. He has chronic shortness of breath but denies chest pain.  Since I saw him virtually 6 months ago he continues to do well.  He is sheltering in place and socially distancing.  He wears oxygen at night and walks with a walker.  He denies chest pain or increasing shortness of breath.  Dr. Elsworth Reed , his pulmonologist follows his COPD.   Current Meds  Medication Sig  . Atropine Sulfate-NaCl 0.01-0.9 % SOLN Apply 1 drop to eye at bedtime.   . Cholecalciferol (VITAMIN D3) 3000 units TABS Take by mouth.  . clobetasol (TEMOVATE) 0.05 % external solution APPLY TO AFFECTED AREAS ON SCALP ONE TO TWO TIMES A DAY AS NEEDED  . desonide (DESOWEN) 0.05 % lotion Apply topically.  . docusate sodium (COLACE) 100 MG capsule Take 100 mg by mouth daily.   Marland Kitchen doxycycline (VIBRA-TABS) 100 MG tablet Take 1 tablet (100 mg total) by mouth daily.  Marland Kitchen ELIQUIS 5 MG TABS tablet TAKE 1 TABLET BY MOUTH  TWICE A DAY  . erythromycin ophthalmic ointment APP THIN LAYER IN OS BID  . MAGNESIUM-OXIDE 400 (241.3 Mg) MG tablet TAKE 1 TABLET BY MOUTH TWICE DAILY  . metoprolol  succinate (TOPROL-XL) 25 MG 24 hr tablet TAKE 1 TABLET BY MOUTH  DAILY  . mometasone (NASONEX) 50 MCG/ACT nasal spray Place 2 sprays into the nose daily. (Patient taking differently: Place 2 sprays into the nose as needed. )  . NARCAN 4 MG/0.1ML LIQD nasal spray kit INSTILL ONE SPRAY PRN FOR ACCIDENTAL OVERDOSE  . oxyCODONE (OXY IR/ROXICODONE) 5 MG immediate release tablet Take 5 mg by mouth 2 (two) times daily as needed. for pain  . potassium chloride (MICRO-K) 10 MEQ CR capsule TAKE 1 CAPSULE BY MOUTH TWICE DAILY  . prednisoLONE acetate (PRED FORTE) 1 % ophthalmic suspension Place 1 drop into the left eye 2 (two) times daily.   . tadalafil (CIALIS) 20 MG tablet Take 0.5-1 tablets (10-20 mg total) by mouth every other day as needed for erectile dysfunction.  . Tafluprost (ZIOPTAN) 0.0015 % SOLN Apply 1 drop to eye at bedtime. Right eye  . tamsulosin (FLOMAX) 0.4 MG CAPS capsule TAKE 1 CAPSULE BY MOUTH AT  BEDTIME  . temazepam (RESTORIL) 15 MG capsule Take 1 capsule (15 mg total) by mouth at bedtime as needed for sleep.  Marland Kitchen torsemide (DEMADEX) 20 MG tablet TAKE 1 TABLET BY MOUTH TWICE DAILY  . triamcinolone cream (KENALOG) 0.1 % APPLY AA BID     Allergies  Allergen Reactions  . Other Itching    Cats: watery eyes, itching, sneezing    Social History  Socioeconomic History  . Marital status: Married    Spouse name: Not on file  . Number of children: Not on file  . Years of education: Not on file  . Highest education level: Not on file  Occupational History  . Not on file  Social Needs  . Financial resource strain: Not on file  . Food insecurity    Worry: Not on file    Inability: Not on file  . Transportation needs    Medical: Not on file    Non-medical: Not on file  Tobacco Use  . Smoking status: Former Smoker    Packs/day: 3.00    Years: 25.00    Pack years: 75.00    Types: Cigarettes    Quit date: 11/28/1984    Years since quitting: 34.8  . Smokeless tobacco: Never Used   Substance and Sexual Activity  . Alcohol use: Never    Frequency: Never  . Drug use: Never  . Sexual activity: Yes  Lifestyle  . Physical activity    Days per week: Not on file    Minutes per session: Not on file  . Stress: Not on file  Relationships  . Social Herbalist on phone: Not on file    Gets together: Not on file    Attends religious service: Not on file    Active member of club or organization: Not on file    Attends meetings of clubs or organizations: Not on file    Relationship status: Not on file  . Intimate partner violence    Fear of current or ex partner: Not on file    Emotionally abused: Not on file    Physically abused: Not on file    Forced sexual activity: Not on file  Other Topics Concern  . Not on file  Social History Narrative   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59)   Married to [employer] ([place] patient)   7 children   Currently lives with son in [place], [place]; all their things are in storage; plan to travel and stay with kids but keep their home base in the [place] area     Review of Systems: General: negative for chills, fever, night sweats or weight changes.  Cardiovascular: negative for chest pain, dyspnea on exertion, edema, orthopnea, palpitations, paroxysmal nocturnal dyspnea or shortness of breath Dermatological: negative for rash Respiratory: negative for cough or wheezing Urologic: negative for hematuria Abdominal: negative for nausea, vomiting, diarrhea, bright red blood per rectum, melena, or hematemesis Neurologic: negative for visual changes, syncope, or dizziness All other systems reviewed and are otherwise negative except as noted above.    Blood pressure 120/76, pulse 62, temperature (!) 97.3 F (36.3 C), height 5' 9" (1.753 m), weight 273 lb 6.4 oz (124 kg), SpO2 96 %.  General appearance: alert and no distress Neck: no adenopathy, no carotid bruit, no JVD, supple,  symmetrical, trachea midline and thyroid not enlarged, symmetric, no tenderness/mass/nodules Lungs: clear to auscultation bilaterally Heart: irregularly irregular rhythm Extremities: 1+ lower extremity edema Pulses: 2+ and symmetric Skin: Skin color, texture, turgor normal. No rashes or lesions Neurologic: Alert and oriented X 3, normal strength and tone. Normal symmetric reflexes. Normal coordination and gait  EKG atrial fibrillation with a ventricular spots of 88.  I personally reviewed this EKG.  ASSESSMENT AND PLAN:   Pulmonary embolism ([place]) History of remote pulmonary embolism on Eliquis oral anticoagulation.  OSA treated with  BiPAP History of obstructive sleep apnea on BiPAP  Essential hypertension History of essential hypertension with blood pressure measured today 120/76.  He is on metoprolol.  Atrial fibrillation ([facility]) History of chronic [name] rate controlled on metoprolol and Eliquis oral anticoagulation.  Bilateral lower extremity edema History of bilateral lower extremity edema on torsemide.      Maurice Harp MD FACP,FACC,FAHA, [facility] 09/18/2019 12:02 PM

## 2019-09-18 NOTE — Patient Instructions (Signed)

## 2019-09-18 NOTE — Progress Notes (Signed)
Cherina moved up his recall to Jan/2021.   Pt still needs to be notified of results.   Wyn Quaker FNP

## 2019-09-18 NOTE — Assessment & Plan Note (Signed)
History of bilateral lower extremity edema on torsemide.

## 2019-09-18 NOTE — Telephone Encounter (Signed)
Will call this afternoon.

## 2019-09-18 NOTE — Assessment & Plan Note (Signed)
History of remote pulmonary embolism on Eliquis oral anticoagulation. °

## 2019-09-18 NOTE — Assessment & Plan Note (Signed)
History of obstructive sleep apnea on BiPAP 

## 2019-09-18 NOTE — Assessment & Plan Note (Signed)
History of chronic [name] rate controlled on metoprolol and Eliquis oral anticoagulation.

## 2019-09-18 NOTE — Telephone Encounter (Signed)
Per 10.13.2020 CT results:  Result Notes for CT Chest High Resolution Notes recorded by Stephanie Coup, CMA on 09/18/2019 at 10:30 AM EDT  Pt called this am and requested to be called back in pm.  Message open in triage.  ------   Notes recorded by Lauraine Rinne, NP on 09/18/2019 at 9:07 AM EDT  Cherina moved up his recall to Jan/2021.   Pt still needs to be notified of results.   Wyn Quaker FNP  ------   Notes recorded by Stephanie Coup, CMA on 09/17/2019 at 5:19 PM EDT  LMTCB x 1  ------   Notes recorded by Rigoberto Noel, MD on 09/17/2019 at 4:43 PM EDT  Slight increase in scarring in the lungs  Needs OV in 3 months to follow up    Called spoke with patient, discussed CT results/recs as stated by Dr. [name].  Patient voiced his understanding and denied any questions/concerns at this time.  Patient is aware recall has been placed for Jan 2021.  Nothing further needed at this time; will sign off.

## 2019-09-18 NOTE — Assessment & Plan Note (Signed)
History of essential hypertension with blood pressure measured today 120/76.  He is on metoprolol.

## 2019-09-20 NOTE — Progress Notes (Signed)
Copied from triage message Called spoke with patient, discussed CT results/recs as stated by Dr. [name].  Patient voiced his understanding and denied any questions/concerns at this time.  Patient is aware recall has been placed for Jan 2021.  Nothing further needed at this time; will sign off.

## 2019-10-04 ENCOUNTER — Other Ambulatory Visit: Payer: Self-pay | Admitting: Physician Assistant

## 2019-10-07 ENCOUNTER — Other Ambulatory Visit: Payer: Self-pay | Admitting: Physician Assistant

## 2019-10-14 ENCOUNTER — Encounter: Payer: Self-pay | Admitting: Physician Assistant

## 2019-10-14 LAB — BASIC METABOLIC PANEL
BUN: 17 (ref 4–21)
CO2: 31 — AB (ref 13–22)
Chloride: 95 — AB (ref 99–108)
Creatinine: 1.2 (ref 0.6–1.3)
Glucose: 146
Potassium: 3.5 (ref 3.4–5.3)
Sodium: 138 (ref 137–147)

## 2019-10-14 LAB — COMPREHENSIVE METABOLIC PANEL
Albumin: 3.9 (ref 3.5–5.0)
Calcium: 9.6 (ref 8.7–10.7)
GFR calc non Af Amer: 62.13

## 2019-10-14 LAB — CBC AND DIFFERENTIAL
HCT: 44 (ref 41–53)
Hemoglobin: 14.3 (ref 13.5–17.5)
Platelets: 202 (ref 150–399)
WBC: 5.5

## 2019-10-14 LAB — HEPATIC FUNCTION PANEL
ALT: 19 (ref 10–40)
AST: 20 (ref 14–40)
Alkaline Phosphatase: 73 (ref 25–125)
Bilirubin, Total: 0.7

## 2019-10-14 LAB — VITAMIN D 25 HYDROXY (VIT D DEFICIENCY, FRACTURES): Vit D, 25-Hydroxy: 52.85

## 2019-11-14 ENCOUNTER — Encounter: Payer: Self-pay | Admitting: Physician Assistant

## 2019-11-16 ENCOUNTER — Other Ambulatory Visit: Payer: Self-pay | Admitting: Physician Assistant

## 2019-11-18 ENCOUNTER — Other Ambulatory Visit: Payer: Self-pay | Admitting: *Deleted

## 2019-11-18 MED ORDER — TEMAZEPAM 15 MG PO CAPS: 15.0000 mg | ORAL_CAPSULE | Freq: Every evening | ORAL | 1 refills | Status: DC | PRN

## 2019-11-18 NOTE — Telephone Encounter (Signed)
Received refill request from [facility] for Temazepam 15 mg capsule. Pt is scheduled to see you on 12/13/2019.

## 2019-11-24 ENCOUNTER — Other Ambulatory Visit: Payer: Self-pay | Admitting: Physician Assistant

## 2019-11-27 ENCOUNTER — Other Ambulatory Visit: Payer: Self-pay | Admitting: Physician Assistant

## 2019-11-27 NOTE — Telephone Encounter (Signed)
Ok to refill 

## 2019-12-11 ENCOUNTER — Telehealth: Payer: Self-pay | Admitting: *Deleted

## 2019-12-11 NOTE — Telephone Encounter (Signed)
Left message on voicemail to call office. Calling in regards to wife's My Chart  Message regarding pt's condition.

## 2019-12-13 ENCOUNTER — Encounter: Payer: Self-pay | Admitting: Physician Assistant

## 2019-12-13 ENCOUNTER — Other Ambulatory Visit: Payer: Self-pay

## 2019-12-13 ENCOUNTER — Ambulatory Visit (INDEPENDENT_AMBULATORY_CARE_PROVIDER_SITE_OTHER): Payer: Medicare Other | Admitting: Physician Assistant

## 2019-12-13 VITALS — BP 130/76 | HR 97 | Temp 98.2°F | Ht 69.0 in | Wt 281.0 lb

## 2019-12-13 DIAGNOSIS — R0609 Other forms of dyspnea: Secondary | ICD-10-CM

## 2019-12-13 DIAGNOSIS — R1030 Lower abdominal pain, unspecified: Secondary | ICD-10-CM | POA: Diagnosis not present

## 2019-12-13 DIAGNOSIS — R0600 Dyspnea, unspecified: Secondary | ICD-10-CM

## 2019-12-13 DIAGNOSIS — M7989 Other specified soft tissue disorders: Secondary | ICD-10-CM

## 2019-12-13 NOTE — Telephone Encounter (Signed)
Pt is being seen now in the office.

## 2019-12-13 NOTE — Progress Notes (Signed)
Maurice Reed is a 79 y.o. male here for a follow up of a pre-existing problem.  I acted as a Education administrator for [employer], PA-C  Anselmo Pickler, LPN  History of Present Illness:   Chief Complaint  Patient presents with  . Abdominal Pain    HPI   Abdominal pain Pt c/o lower abd pain x a few weeks, waxing and waning that seems better today. Pain got severe enough where he was in tears. Had severe pain with sneezing. Has three abdominal hernias that he has been told that he is not a surgical candidate for. Has been sleeping a lot, gets labored breath pretty easily -- when walking across the room, oxygen goes down to 80's while on 3 L of oxygen.  Wt Readings from Last 5 Encounters:  12/13/19 281 lb (127.5 kg)  09/18/19 273 lb 6.4 oz (124 kg)  08/23/19 276 lb (125.2 kg)  08/16/19 267 lb (121.1 kg)  03/26/19 255 lb 12.8 oz (116 kg)   Last follow-up with cardiology was 09/18/19 -- Dr. Gwenlyn Found Last follow-up with pulmonology was 08/16/19 -- Dr. [name]  History of constipation, ongoing issue.  Feels like he is volume overloaded, currently on 20 mg torsemide BID. His water intake is supposed to be < 8 cups water a day, but he admits that he drinks between 10-12 cups daily. Also reports that at times he double his torsemide, unable to tell me how often he does this.  Past Medical History:  Diagnosis Date  . Arthritis   . Atrial fibrillation ([place])   . 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 GERD (gastroesophageal reflux disease)   . Kidney stones   . Obesity      Social History   Socioeconomic History  . Marital status: Married    Spouse name: Not on file  . Number of children: Not on file  . Years of education: Not on file  . Highest education level: Not on file  Occupational History  . Not on file  Tobacco Use  . Smoking status: Former Smoker    Packs/day: 3.00    Years: 25.00    Pack years: 75.00    Types: Cigarettes    Quit date: 11/28/1984    Years since  quitting: 35.0  . Smokeless tobacco: Never Used  Substance and Sexual Activity  . Alcohol use: Never  . Drug use: Never  . Sexual activity: Yes  Other Topics Concern  . 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 Difficulty of Paying Living Expenses: Not on file  Food Insecurity:   . Worried About Charity fundraiser in the Last Year: Not on file  . Ran Out of Food in the Last Year: Not on file  Transportation Needs:   . Lack of Transportation (Medical): Not on file  . Lack of Transportation (Non-Medical): Not on file  Physical Activity:   . Days of Exercise per Week: Not on file  . Minutes of Exercise per Session: Not on file  Stress:   .  Feeling of Stress : Not on file  Social Connections:   . Frequency of Communication with Friends and Family: Not on file  . Frequency of Social Gatherings with Friends and Family: Not on file  . Attends Religious Services: Not on file  . Active Member of Clubs or Organizations: Not on file  . Attends Archivist Meetings: Not on file  . Marital Status: Not on file  Intimate Partner Violence:   . Fear of Current or Ex-Partner: Not on file  . Emotionally Abused: Not on file  . Physically Abused: Not on file  . Sexually Abused: Not on file    Past Surgical History:  Procedure Laterality Date  . APPENDECTOMY  1963  . LITHOTRIPSY  1987  . NOSE SURGERY  2003  . TONSILLECTOMY AND ADENOIDECTOMY  1948  . UMBILICAL HERNIA REPAIR  1984    Family History  Problem Relation Age of Onset  . Arthritis Mother   . Hearing loss Mother   . Hypertension Mother   . Heart disease Mother    . Stroke Mother   . Arthritis Father   . Hearing loss Father   . Heart disease Father   . Hypertension Father   . Heart attack Father   . Kidney disease Father   . Stroke Father     Allergies  Allergen Reactions  . Other Itching    Cats: watery eyes, itching, sneezing    Current Medications:   Current Outpatient Medications:  .  Ascorbic Acid (VITAMIN C) 500 MG CAPS, Take 1 capsule by mouth daily., Disp: , Rfl:  .  Atropine Sulfate-NaCl 0.01-0.9 % SOLN, Apply 1 drop to eye at bedtime. , Disp: , Rfl:  .  Cholecalciferol (VITAMIN D3) 3000 units TABS, Take by mouth., Disp: , Rfl:  .  clobetasol (TEMOVATE) 0.05 % external solution, APPLY TO AFFECTED AREAS ON SCALP ONE TO TWO TIMES A DAY AS NEEDED, Disp: 150 mL, Rfl: 4 .  desonide (DESOWEN) 0.05 % lotion, Apply topically., Disp: , Rfl:  .  docusate sodium (COLACE) 100 MG capsule, Take 100 mg by mouth daily. , Disp: , Rfl:  .  doxycycline (VIBRA-TABS) 100 MG tablet, TAKE 1 TABLET BY MOUTH  DAILY, Disp: 90 tablet, Rfl: 1 .  ELIQUIS 5 MG TABS tablet, TAKE 1 TABLET BY MOUTH  TWICE DAILY, Disp: 180 tablet, Rfl: 1 .  erythromycin ophthalmic ointment, APP THIN LAYER IN OS BID, Disp: , Rfl: 1 .  MAGNESIUM-OXIDE 400 (241.3 Mg) MG tablet, TAKE 1 TABLET BY MOUTH TWICE DAILY, Disp: 60 tablet, Rfl: 2 .  metoprolol succinate (TOPROL-XL) 25 MG 24 hr tablet, TAKE 1 TABLET BY MOUTH  DAILY, Disp: 90 tablet, Rfl: 1 .  mometasone (NASONEX) 50 MCG/ACT nasal spray, Place 2 sprays into the nose daily. (Patient taking differently: Place 2 sprays into the nose as needed. ), Disp: 51 g, Rfl: 2 .  NARCAN 4 MG/0.1ML LIQD nasal spray kit, INSTILL ONE SPRAY PRN FOR ACCIDENTAL OVERDOSE, Disp: , Rfl: 0 .  oxyCODONE (OXY IR/ROXICODONE) 5 MG immediate release tablet, Take 5 mg by mouth 2 (two) times daily as needed. for pain, Disp: , Rfl: 0 .  potassium chloride (MICRO-K) 10 MEQ CR capsule, TAKE 1 CAPSULE BY MOUTH  TWICE DAILY, Disp: 180 capsule, Rfl: 1 .  prednisoLONE  acetate (PRED FORTE) 1 % ophthalmic suspension, Place 1 drop into the left eye 2 (two) times daily. , Disp: , Rfl:  .  tadalafil (CIALIS) 20 MG tablet, Take  0.5-1 tablets (10-20 mg total) by mouth every other day as needed for erectile dysfunction., Disp: 5 tablet, Rfl: 11 .  Tafluprost (ZIOPTAN) 0.0015 % SOLN, Apply 1 drop to eye at bedtime. Right eye, Disp: , Rfl:  .  tamsulosin (FLOMAX) 0.4 MG CAPS capsule, TAKE 1 CAPSULE BY MOUTH AT  BEDTIME, Disp: 90 capsule, Rfl: 1 .  temazepam (RESTORIL) 15 MG capsule, Take 1 capsule (15 mg total) by mouth at bedtime as needed for sleep., Disp: 90 capsule, Rfl: 1 .  torsemide (DEMADEX) 20 MG tablet, TAKE 1 TABLET BY MOUTH TWO  TIMES DAILY, Disp: 180 tablet, Rfl: 3 .  triamcinolone cream (KENALOG) 0.1 %, APPLY AA BID, Disp: , Rfl: 0   Review of Systems:   ROS  Negative unless otherwise specified per HPI.  Vitals:   Vitals:   12/13/19 1428  BP: 130/76  Pulse: 97  Temp: 98.2 F (36.8 C)  TempSrc: Temporal  SpO2: 99%  Weight: 281 lb (127.5 kg)  Height: '5\' 9"'  (1.753 m)     Body mass index is 41.5 kg/m.  Physical Exam:   Physical Exam Vitals and nursing note reviewed.  Constitutional:      General: He is not in acute distress.    Appearance: He is well-developed. He is not ill-appearing or toxic-appearing.  HENT:     Head: Normocephalic and atraumatic.  Eyes:     General: Lids are normal.     Conjunctiva/sclera: Conjunctivae normal.  Neck:     Trachea: Trachea normal.  Cardiovascular:     Rate and Rhythm: Normal rate. Rhythm irregularly irregular.     Pulses: Normal pulses.     Heart sounds: Normal heart sounds, S1 normal and S2 normal.     Comments: 1+ edema bilaterally; wearing compression socks Pulmonary:     Effort: Pulmonary effort is normal.     Breath sounds: Normal breath sounds. No decreased breath sounds, wheezing, rhonchi or rales.  Abdominal:     General: Abdomen is flat. Bowel sounds are normal.     Palpations:  Abdomen is soft.     Tenderness: There is no abdominal tenderness.  Lymphadenopathy:     Cervical: No cervical adenopathy.  Skin:    General: Skin is warm and dry.  Neurological:     Mental Status: He is alert.     GCS: GCS eye subscore is 4. GCS verbal subscore is 5. GCS motor subscore is 6.  Psychiatric:        Speech: Speech normal.        Behavior: Behavior normal. Behavior is cooperative.     Assessment nd Plan:   Theseus was seen today for abdominal pain.  Diagnoses and all orders for this visit:  Leg swelling Will have patient increase his torsemide to 40 mg daily in AM and 40 mg daily in PM x 1 week. Will also update labs today. Discussed need for compliance with fluid restriction, daily weights. Recommended close follow-up with Dr. Gwenlyn Found to see if he should continue this increase in his regimen. -     Comprehensive metabolic panel; Future -     CBC with Differential/Platelet; Future -     B Nat Peptide; Future -     TSH; Future  DOE (dyspnea on exertion) Will recommend patient follow-up with Dr. [name] as well as Dr. Gwenlyn Found for further evaluation. He seems to have worsening dyspnea and control of his oxygen level with activity. Encouraged compliance with fluid restrictions and daily weights.  Abdominal pain Unclear etiology, concern for worsening hernia vs mesenteric ischemia vs other. Will obtain CT scan for further evaluation. Discussed worsening precautions -- low threshold to go to ER for further evaluation if any changes or return of severe debilitating pain.   . Reviewed expectations re: course of current medical issues. . Discussed self-management of symptoms. . Outlined signs and symptoms indicating need for more acute intervention. . Patient verbalized understanding and all questions were answered. . See orders for this visit as documented in the electronic medical record. . Patient received an After-Visit Summary.  CMA or LPN served as scribe during this visit.  History, Physical, and Plan performed by medical provider. The above documentation has been reviewed and is accurate and complete.   Inda Coke, PA-C

## 2019-12-13 NOTE — Patient Instructions (Addendum)
It was great to see you!  Let's increase your torsemide to 40 mg twice daily x 1 week. Please follow-up with Dr. Allyson Sabal for further recommendations on this.  Anytime you have any of the following symptoms call Dr. Hazle Coca office: 1) 3 pound weight gain in 24 hours or 5 pounds in 1 week   Call Dr. Allyson Sabal and Dr. Vassie Loll to follow up on your heart and lungs.  You will be contacted about your CT scan.  Many things can cause belly (abdominal) pain. Most times, belly pain is not dangerous. Many cases of belly pain can be watched and treated at home. Sometimes belly pain is serious, though. Your doctor will try to find the cause of your belly pain.   Follow these instructions at home:  Take over-the-counter and prescription medicines only as told by your doctor. Do not take medicines that help you poop (laxatives) unless told to by your doctor.  Drink enough fluid to keep your pee (urine) clear or pale yellow.  Watch your belly pain for any changes.  Keep all follow-up visits as told by your doctor. This is important.  Contact a doctor if:  Your belly pain changes or gets worse.  You are not hungry, or you lose weight without trying.  You are having trouble pooping (constipated) or have watery poop (diarrhea) for more than 2-3 days.  You have pain when you pee or poop.  Your belly pain wakes you up at night.  Your pain gets worse with meals, after eating, or with certain foods.  You are throwing up and cannot keep anything down.  You have a fever.  Get help right away if:  Your pain does not go away as soon as your doctor says it should.  You cannot stop throwing up.  Your pain is only in areas of your belly, such as the right side or the left lower part of the belly.  You have bloody or black poop, or poop that looks like tar.  You have very bad pain, cramping, or bloating in your belly.  You have signs of not having enough fluid or water in your body (dehydration), such  as: ? Dark pee, very little pee, or no pee. ? Cracked lips. ? Dry mouth. ? Sunken eyes. ? Sleepiness. ? Weakness.  Let's follow-up in 2 weeks, sooner if you have concerns.  Take care,  Jarold Motto PA-C

## 2019-12-16 ENCOUNTER — Inpatient Hospital Stay: Admission: RE | Admit: 2019-12-16 | Payer: Medicare Other | Source: Ambulatory Visit

## 2019-12-17 ENCOUNTER — Emergency Department (HOSPITAL_COMMUNITY): Payer: Medicare Other

## 2019-12-17 ENCOUNTER — Emergency Department (HOSPITAL_COMMUNITY)
Admission: EM | Admit: 2019-12-17 | Discharge: 2019-12-17 | Disposition: A | Discharge status: Home / Self Care | Payer: Medicare Other | Attending: Emergency Medicine | Admitting: Emergency Medicine

## 2019-12-17 ENCOUNTER — Other Ambulatory Visit: Payer: Self-pay

## 2019-12-17 ENCOUNTER — Telehealth: Payer: Self-pay | Admitting: Internal Medicine

## 2019-12-17 ENCOUNTER — Telehealth: Payer: Self-pay | Admitting: Cardiovascular Disease

## 2019-12-17 DIAGNOSIS — Z20822 Contact with and (suspected) exposure to covid-19: Secondary | ICD-10-CM | POA: Insufficient documentation

## 2019-12-17 DIAGNOSIS — I4891 Unspecified atrial fibrillation: Secondary | ICD-10-CM | POA: Insufficient documentation

## 2019-12-17 DIAGNOSIS — R531 Weakness: Secondary | ICD-10-CM | POA: Insufficient documentation

## 2019-12-17 DIAGNOSIS — Z7901 Long term (current) use of anticoagulants: Secondary | ICD-10-CM | POA: Insufficient documentation

## 2019-12-17 DIAGNOSIS — R0602 Shortness of breath: Secondary | ICD-10-CM

## 2019-12-17 DIAGNOSIS — Z86711 Personal history of pulmonary embolism: Secondary | ICD-10-CM | POA: Insufficient documentation

## 2019-12-17 DIAGNOSIS — Z79899 Other long term (current) drug therapy: Secondary | ICD-10-CM | POA: Insufficient documentation

## 2019-12-17 DIAGNOSIS — J432 Centrilobular emphysema: Secondary | ICD-10-CM | POA: Diagnosis not present

## 2019-12-17 DIAGNOSIS — Z87891 Personal history of nicotine dependence: Secondary | ICD-10-CM | POA: Diagnosis not present

## 2019-12-17 DIAGNOSIS — R42 Dizziness and giddiness: Secondary | ICD-10-CM | POA: Diagnosis present

## 2019-12-17 LAB — CBC WITH DIFFERENTIAL/PLATELET
Abs Immature Granulocytes: 0.03 10*3/uL (ref 0.00–0.07)
Absolute Monocytes: 582 cells/uL (ref 200–950)
Basophils Absolute: 22 cells/uL (ref 0–200)
Basophils Absolute: 0 10*3/uL (ref 0.0–0.1)
Basophils Relative: 0.4 %
Basophils Relative: 0 %
Eosinophils Absolute: 112 cells/uL (ref 15–500)
Eosinophils Absolute: 0.1 10*3/uL (ref 0.0–0.5)
Eosinophils Relative: 2 %
Eosinophils Relative: 2 %
HCT: 40.3 % (ref 38.5–50.0)
HCT: 44.9 % (ref 39.0–52.0)
Hemoglobin: 14 g/dL (ref 13.2–17.1)
Hemoglobin: 14.7 g/dL (ref 13.0–17.0)
Immature Granulocytes: 1 %
Lymphocytes Relative: 25 %
Lymphs Abs: 1691 cells/uL (ref 850–3900)
Lymphs Abs: 1.5 10*3/uL (ref 0.7–4.0)
MCH: 34.6 pg — ABNORMAL HIGH (ref 27.0–33.0)
MCH: 34.7 pg — ABNORMAL HIGH (ref 26.0–34.0)
MCHC: 34.7 g/dL (ref 32.0–36.0)
MCHC: 32.7 g/dL (ref 30.0–36.0)
MCV: 99.5 fL (ref 80.0–100.0)
MCV: 105.9 fL — ABNORMAL HIGH (ref 80.0–100.0)
MPV: 10.2 fL (ref 7.5–12.5)
Monocytes Absolute: 0.5 10*3/uL (ref 0.1–1.0)
Monocytes Relative: 10.4 %
Monocytes Relative: 9 %
Neutro Abs: 3192 cells/uL (ref 1500–7800)
Neutro Abs: 3.9 10*3/uL (ref 1.7–7.7)
Neutrophils Relative %: 57 %
Neutrophils Relative %: 63 %
Platelets: 194 10*3/uL (ref 140–400)
Platelets: 208 10*3/uL (ref 150–400)
RBC: 4.05 10*6/uL — ABNORMAL LOW (ref 4.20–5.80)
RBC: 4.24 MIL/uL (ref 4.22–5.81)
RDW: 12.2 % (ref 11.0–15.0)
RDW: 12.5 % (ref 11.5–15.5)
Total Lymphocyte: 30.2 %
WBC: 5.6 10*3/uL (ref 3.8–10.8)
WBC: 6.1 10*3/uL (ref 4.0–10.5)
nRBC: 0 % (ref 0.0–0.2)

## 2019-12-17 LAB — MAGNESIUM: Magnesium: 2 mg/dL (ref 1.7–2.4)

## 2019-12-17 LAB — COMPREHENSIVE METABOLIC PANEL
AG Ratio: 1.4 (calc) (ref 1.0–2.5)
ALT: 15 U/L (ref 9–46)
ALT: 18 U/L (ref 0–44)
AST: 19 U/L (ref 10–35)
AST: 22 U/L (ref 15–41)
Albumin: 3.8 g/dL (ref 3.6–5.1)
Albumin: 3.6 g/dL (ref 3.5–5.0)
Alkaline Phosphatase: 74 U/L (ref 38–126)
Alkaline phosphatase (APISO): 76 U/L (ref 35–144)
Anion gap: 9 (ref 5–15)
BUN: 16 mg/dL (ref 7–25)
BUN: 17 mg/dL (ref 8–23)
CO2: 34 mmol/L — ABNORMAL HIGH (ref 20–32)
CO2: 34 mmol/L — ABNORMAL HIGH (ref 22–32)
Calcium: 9.6 mg/dL (ref 8.6–10.3)
Calcium: 9.4 mg/dL (ref 8.9–10.3)
Chloride: 99 mmol/L (ref 98–110)
Chloride: 96 mmol/L — ABNORMAL LOW (ref 98–111)
Creat: 0.9 mg/dL (ref 0.70–1.18)
Creatinine, Ser: 0.94 mg/dL (ref 0.61–1.24)
GFR calc Af Amer: >60 mL/min (ref >60)
GFR calc non Af Amer: >60 mL/min (ref >60)
Globulin: 2.7 g/dL (calc) (ref 1.9–3.7)
Glucose, Bld: 109 mg/dL — ABNORMAL HIGH (ref 65–99)
Glucose, Bld: 105 mg/dL — ABNORMAL HIGH (ref 70–99)
Potassium: 4.2 mmol/L (ref 3.5–5.3)
Potassium: 3.8 mmol/L (ref 3.5–5.1)
Sodium: 141 mmol/L (ref 135–146)
Sodium: 139 mmol/L (ref 135–145)
Total Bilirubin: 0.6 mg/dL (ref 0.2–1.2)
Total Bilirubin: 0.5 mg/dL (ref 0.3–1.2)
Total Protein: 6.5 g/dL (ref 6.1–8.1)
Total Protein: 6.7 g/dL (ref 6.5–8.1)

## 2019-12-17 LAB — POC SARS CORONAVIRUS 2 AG -  ED: SARS Coronavirus 2 Ag: NEGATIVE

## 2019-12-17 LAB — PROTIME-INR
INR: 1.2 (ref 0.8–1.2)
Prothrombin Time: 15.2 seconds (ref 11.4–15.2)

## 2019-12-17 LAB — LACTIC ACID, PLASMA

## 2019-12-17 LAB — BRAIN NATRIURETIC PEPTIDE
B Natriuretic Peptide: 65 pg/mL (ref 0.0–100.0)
Brain Natriuretic Peptide: 67 pg/mL (ref <100)

## 2019-12-17 LAB — TSH: TSH: 2.85 mIU/L (ref 0.40–4.50)

## 2019-12-17 LAB — TROPONIN I (HIGH SENSITIVITY)
Troponin I (High Sensitivity): 4 ng/L (ref <18)
Troponin I (High Sensitivity): 5 ng/L (ref <18)

## 2019-12-17 LAB — LIPASE, BLOOD: Lipase: 28 U/L (ref 11–51)

## 2019-12-17 IMAGING — DX DG CHEST 1V PORT
1 series · 1 of 1 positions shown · non-contrast
Comparison: CT [DATE], radiograph [DATE]

CLINICAL DATA: Shortness of breath

EXAM:
PORTABLE CHEST 1 VIEW

[chest ap]
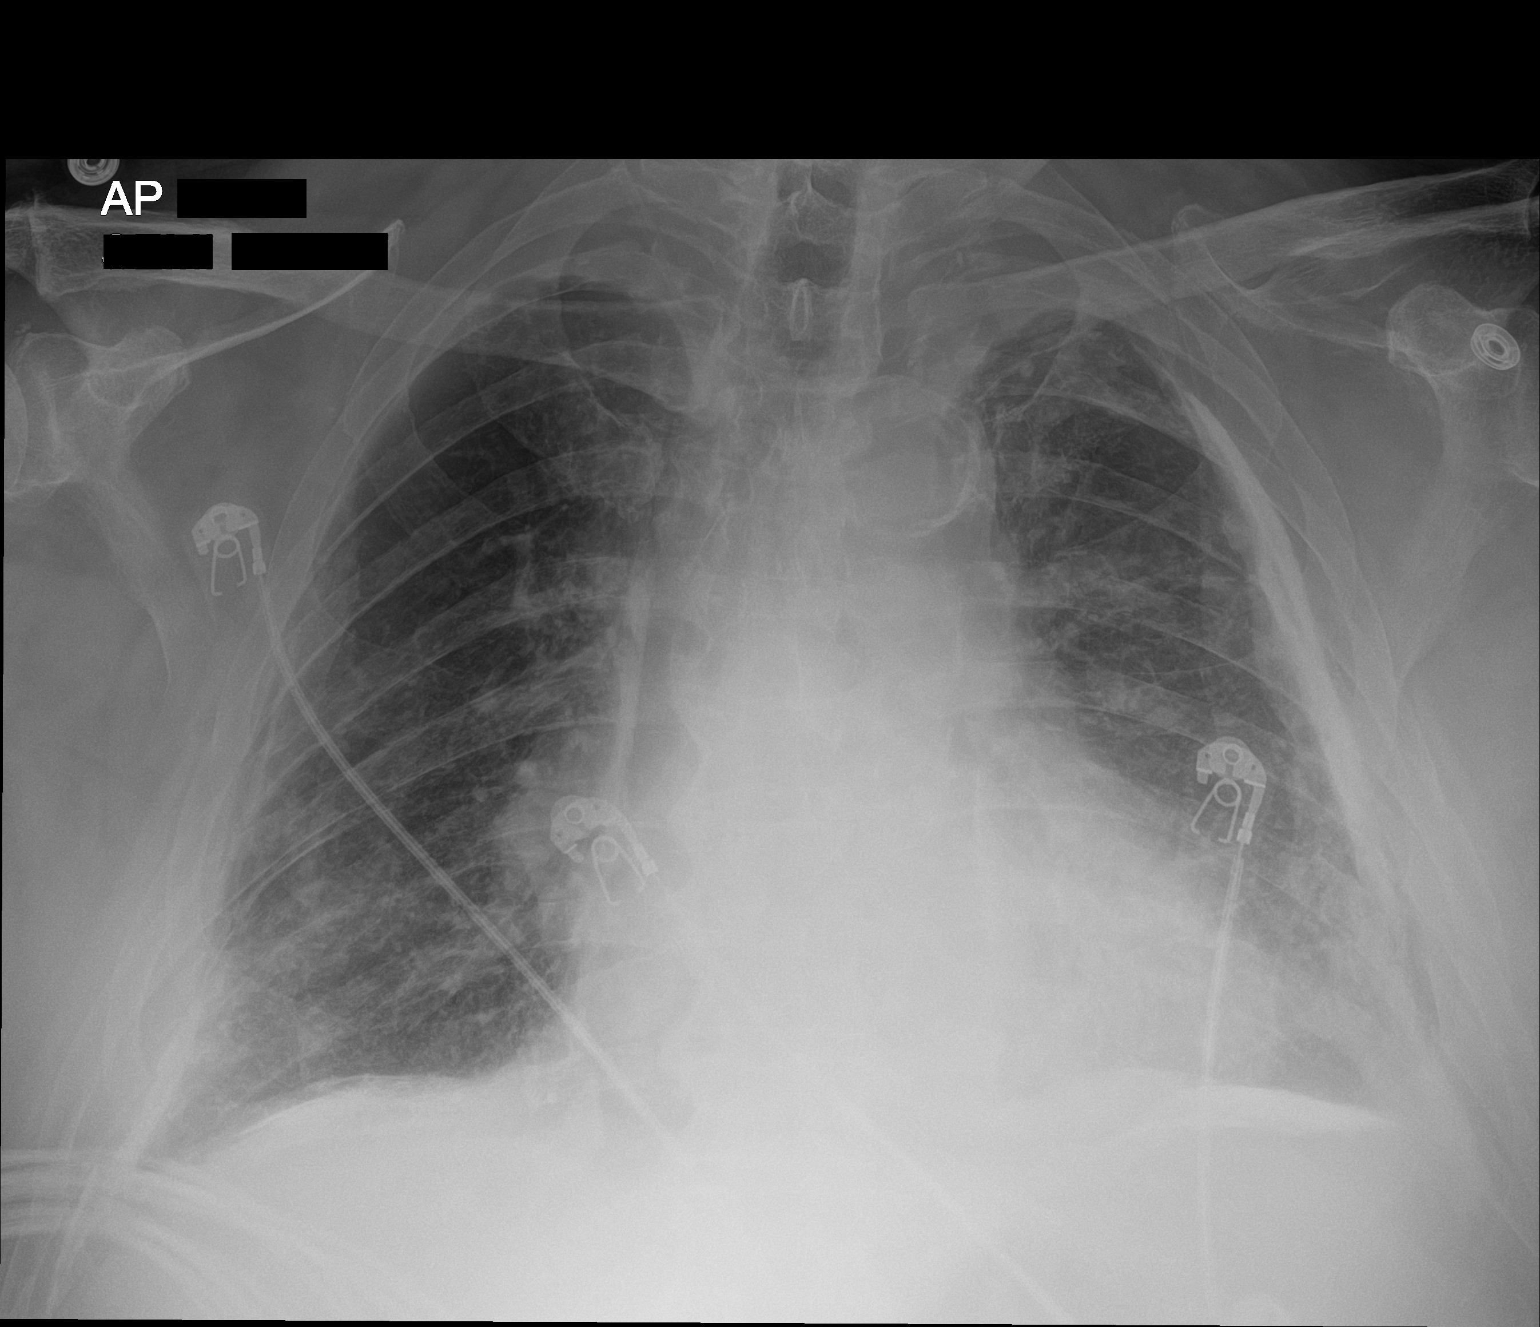

[1 of 1 positions shown; findings below may reference images not displayed]

FINDINGS: Bilateral fibrotic and calcific changes of pleura are similar to
prior. May obscure underlying processes. Coarse opacities in the
lung bases and basilar periphery are similar to prior. No acute
consolidative process is clearly evident. No visible pneumothorax.
Abundant mediastinal fat, better assessed on comparison CT. The
aorta is calcified. The remaining cardiomediastinal contours are
unremarkable. The osseous structures appear diffusely demineralized
which may limit detection of small or nondisplaced fractures.
Degenerative changes are present in the imaged spine and shoulders.
No acute osseous or soft tissue abnormality.
IMPRESSION: 1. No acute consolidative process.
2. Unchanged appearance of bilateral pleuroparenchymal scarring
fibrosis.
3. Abundant mediastinal fat, better assessed on comparison CT.
4.  Aortic Atherosclerosis ([TM]-[TM]).

## 2019-12-17 IMAGING — CT CT ANGIO CHEST
2 of 7 series · 18 of 46 positions shown · IV contrast (APPLIED)
Comparison: None.

CLINICAL DATA: Atrial fibrillation.  Dizziness.

EXAM:
CT ANGIOGRAPHY CHEST WITH CONTRAST
TECHNIQUE: Multidetector CT imaging of the chest was performed using the
standard protocol during bolus administration of intravenous
contrast. Multiplanar CT image reconstructions and MIPs were
obtained to evaluate the vascular anatomy.
CONTRAST:  75mL OMNIPAQUE IOHEXOL 350 MG/ML SOLN

[Series 7: thins · axial · 0.79mm/px · z∈[-118,+120]mm · 15 of 385 slices shown]
[im 22/385  lung]
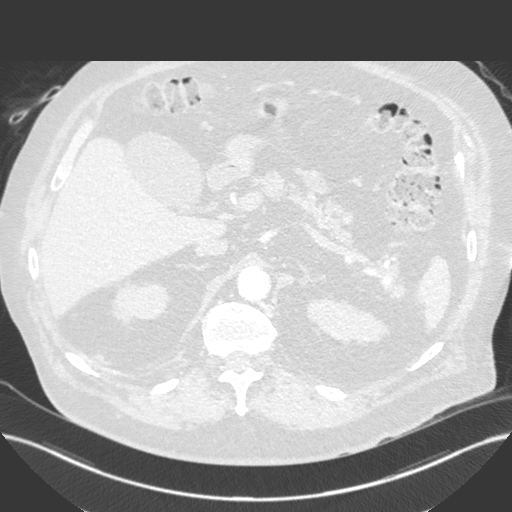
[im 43/385  soft-tissue]
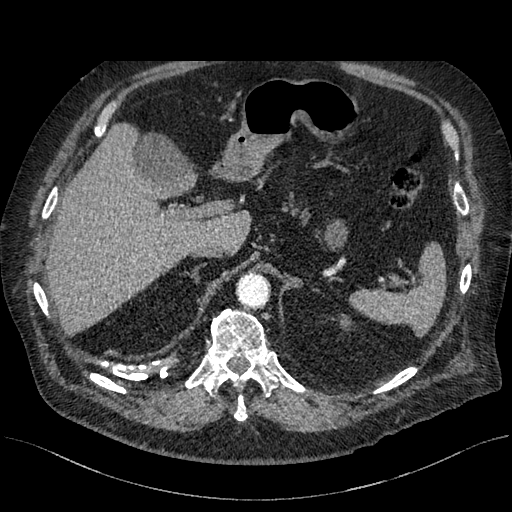
[im 65/385  lung]
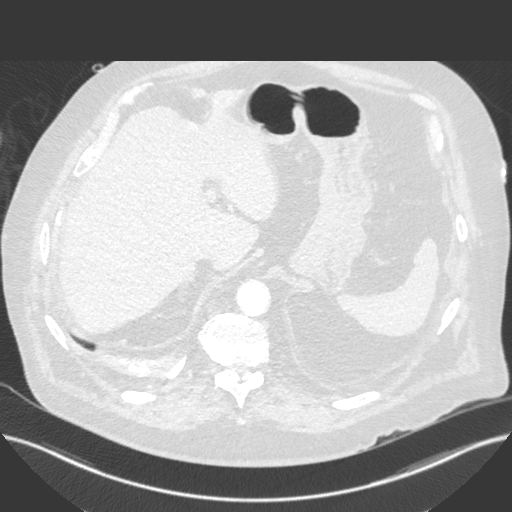
[im 86/385  soft-tissue]
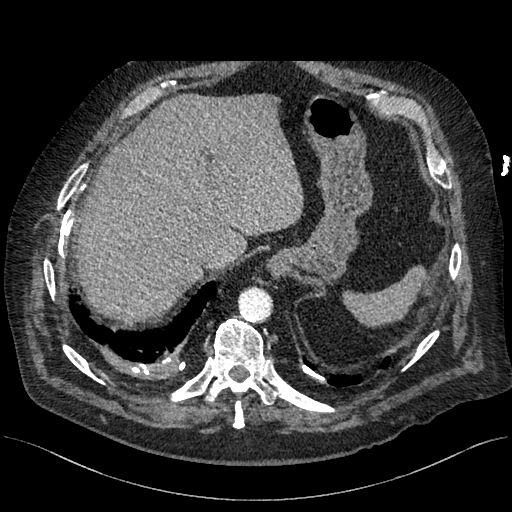
[im 129/385  lung]
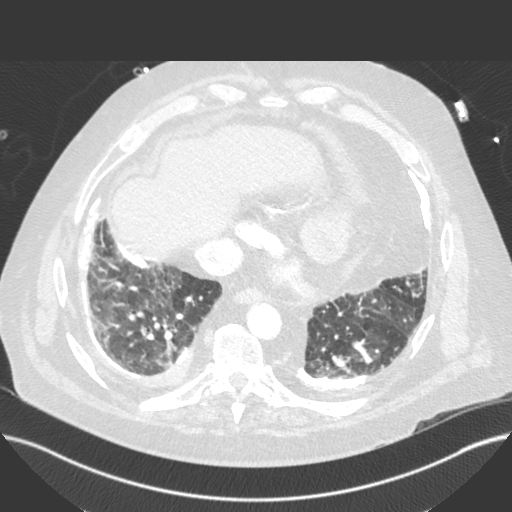
[im 150/385  soft-tissue]
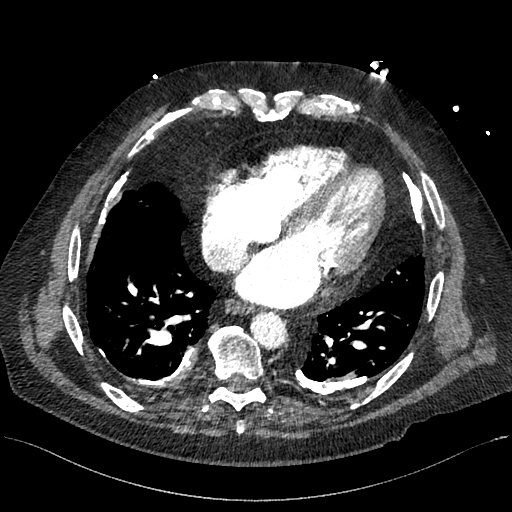
[im 171/385  lung]
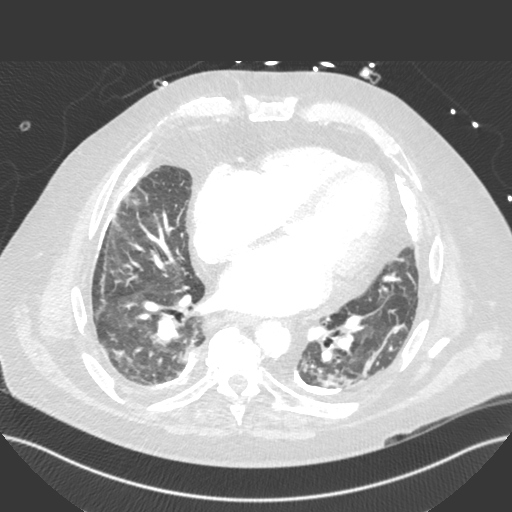
[im 193/385  soft-tissue]
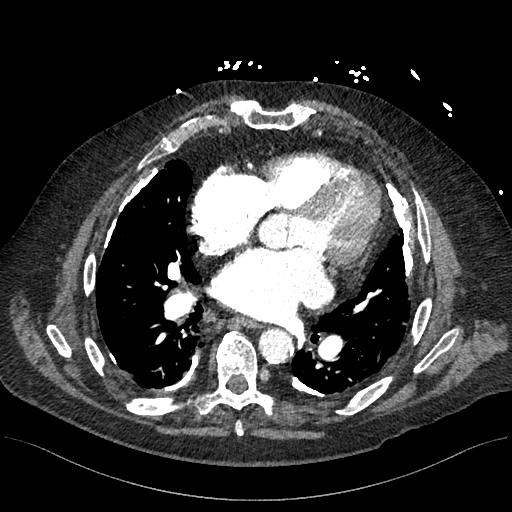
[im 214/385  lung]
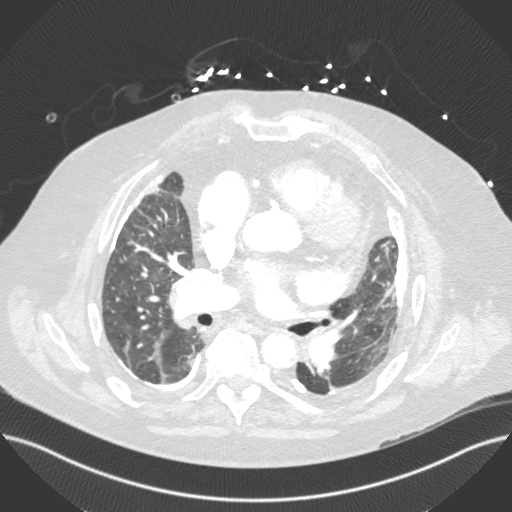
[im 235/385  soft-tissue]
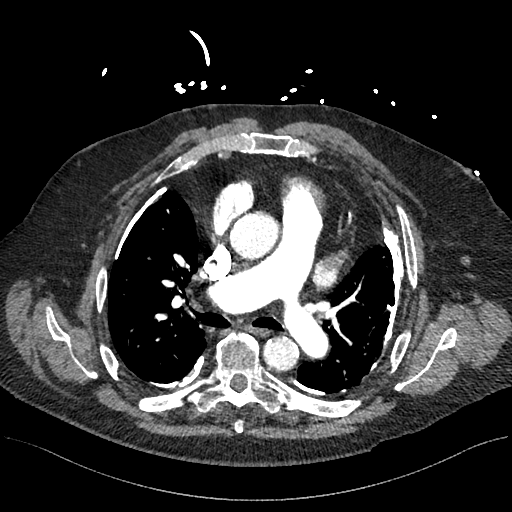
[im 257/385  lung]
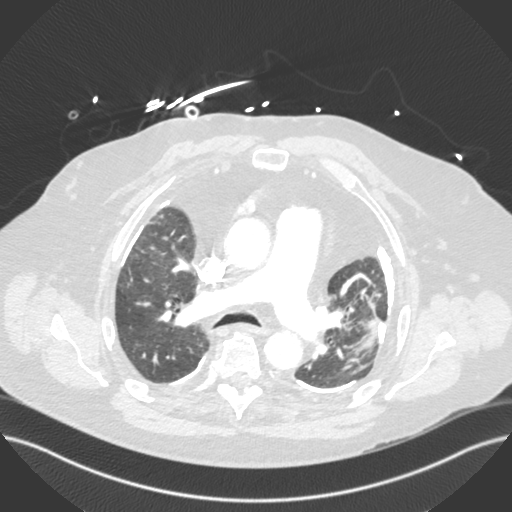
[im 299/385  soft-tissue]
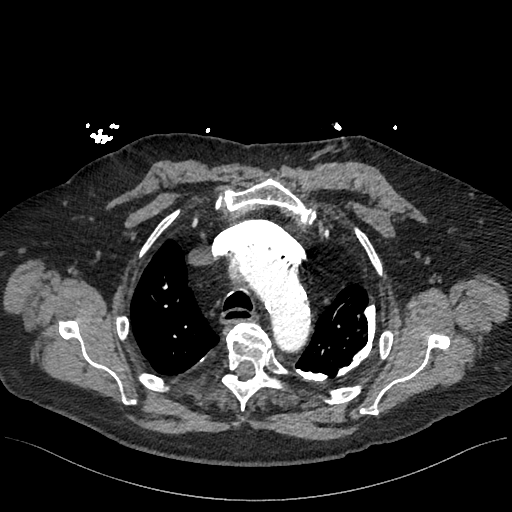
[im 321/385  lung]
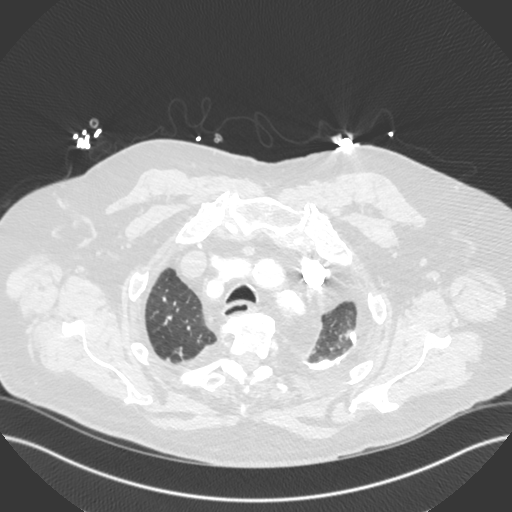
[im 342/385  soft-tissue]
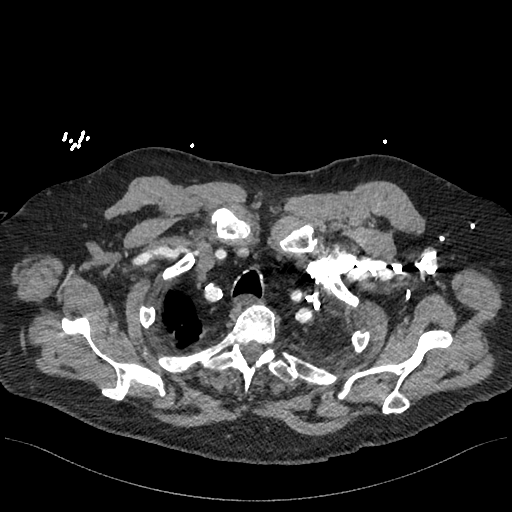
[im 363/385  lung]
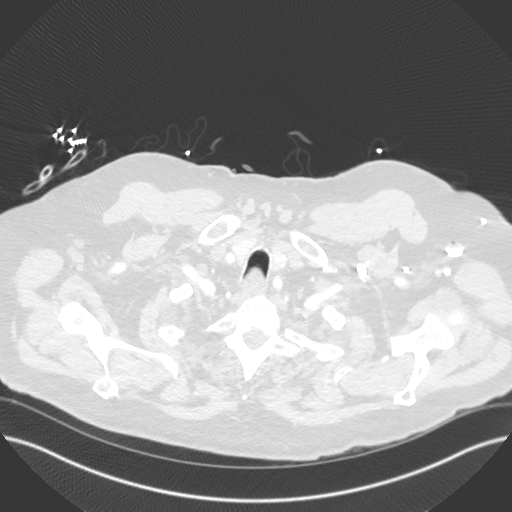

[Series 8: cor · coronal · 0.56mm/px · 3 of 184 slices shown]
[im 46/184  soft-tissue]
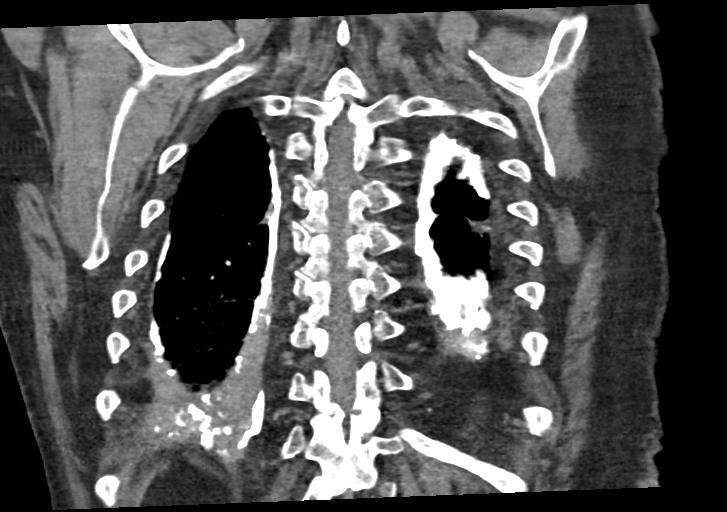
[im 92/184  soft-tissue]
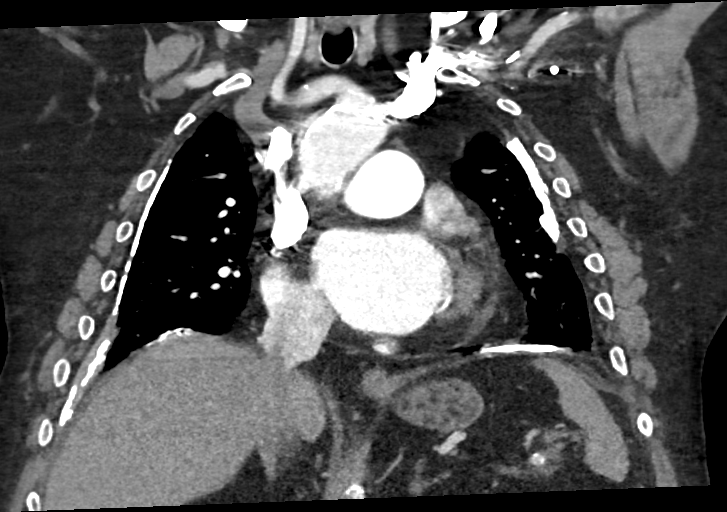
[im 138/184  soft-tissue]
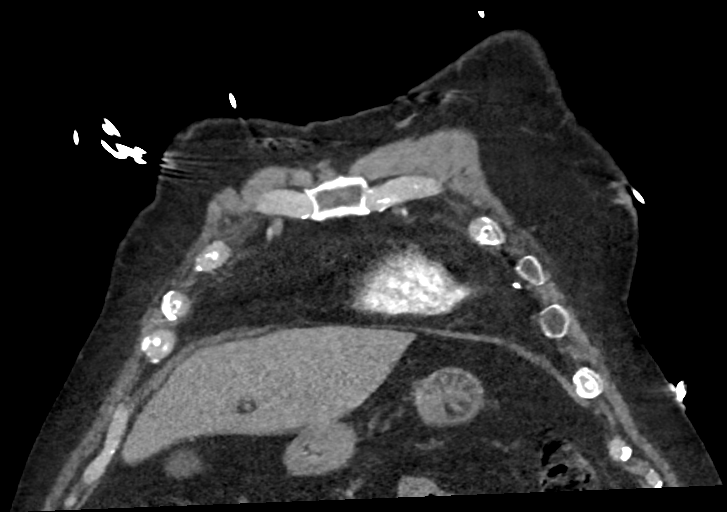

[18 of 46 positions shown; findings below may reference images not displayed]

FINDINGS: Cardiovascular: No filling defects in the pulmonary arteries to
suggest pulmonary emboli. Diffuse aortic and coronary artery
atherosclerotic calcifications. Cardiomegaly. No evidence of aortic
aneurysm or dissection.

Mediastinum/Nodes: No mediastinal, hilar, or axillary adenopathy.
Trachea and esophagus are unremarkable. Thyroid unremarkable.

Lungs/Pleura: Bilateral calcified pleural plaques. Ground-glass
opacities in the lower lungs likely reflects atelectasis. Areas of
scarring in the lungs bilaterally. No effusions.

Upper Abdomen: Imaging into the upper abdomen shows no acute
findings.

Musculoskeletal: Chest wall soft tissues are unremarkable. No acute
bony abnormality.

Review of the MIP images confirms the above findings.
IMPRESSION: Cardiomegaly, coronary artery disease.

No evidence of pulmonary embolus.

Bilateral calcified pleural plaques. Areas of scarring in the lungs
bilaterally. Bibasilar atelectasis.

Aortic Atherosclerosis ([YH]-[YH]).

## 2019-12-17 MED ORDER — IOHEXOL 350 MG/ML SOLN
75.0000 mL | Freq: Once | INTRAVENOUS | Status: AC | PRN
  Administered 2019-12-17: 75 mL via INTRAVENOUS

## 2019-12-17 NOTE — Discharge Instructions (Addendum)
Today your blood work was overall reassuring.  Your BNP, a heart failure number, was normal.  Your CAT scan on your chest did not show any evidence of a blood clot in your chest.  If your symptoms worsen, you develop new or concerning symptoms, or have other concerns please seek additional medical care and evaluation.  Please call your cardiologist and your primary care doctor tomorrow to schedule follow-up appointments.

## 2019-12-17 NOTE — ED Notes (Signed)
Lab to add on troponin  

## 2019-12-17 NOTE — Telephone Encounter (Signed)
Patient c/o Palpitations:  High priority if patient c/o lightheadedness, shortness of breath, or chest pain  1) How long have you had palpitations/irregular HR/ Afib? Are you having the symptoms now? Over a week, is having symptoms now.  2) Are you currently experiencing lightheadedness, SOB or CP? Lightheadedness, Dizzy, SOB  3) Do you have a history of afib (atrial fibrillation) or irregular heart rhythm? Yes  4) Have you checked your BP or HR? (document readings if available):        89/40        95/42       70/24  5) Are you experiencing any other symptoms? Body jerks & Dizziness    Patient's wife is calling concerned with the patient experiencing Afib for over a week. She states his HR and Oxygen levels have been declining drastically when the patient moves around. The patient has been experiencing body jerks with most occurring at night. She states he has also been experiencing lightheadedness, SOB, & dizziness. Please advise.

## 2019-12-17 NOTE — Telephone Encounter (Signed)
Pts wife called to report the pt saw his PCP 4 days ago for abdominal pain and dizziness... he has declined since then... he sleeps all day, heart rate is reading in the 20's even with ambulation, his O2 sats are dropping into the 60's even on 2 liters of O2... he is very SOB, dizzy, and lethargic.. she is very concerned he is worsening... BP 89/40. 95/42, 70/24.... on their new monitor... he is not eating and drinking... I have strongly urged her to take him via EMS to the ED... pt needs asap assessment and be put on a monitor.. she agreed and will talk with the pt about going asap.

## 2019-12-17 NOTE — ED Provider Notes (Signed)
[facility] EMERGENCY DEPARTMENT Provider Note   CSN: [id number] Arrival date & time: 12/17/19  1517     History Chief Complaint  Patient presents with  . Dizziness    Maurice Reed is a 79 y.o. male with a past medical history of [name] anticoagulated with Eliquis, pulmonary hypertension, PE, COPD chronically on 3 L nasal cannula, who presents today for evaluation of generally feeling unwell.  History obtained from chart review and patient.  He was seen by his PCP 4 days ago with he was noted to have abdominal pain, worsening shortness of breath especially with exertion and fatigue.  At that time his torsemide was increased to 40 mg twice daily for 1 week.  He reports that he gets significantly fatigued when walking.  He has to sit down to shower and even at the end of that is fatigued.  He reports worsening dyspnea with exertion.  His torsemide has not changed his leg swelling or breathing.  He reports compliance with all of his medications.  He denies any fevers.  He has been evaluated by his primary care doctor for abdominal pain.  He reports that this is unchanged over the past few weeks.  He denies nausea, vomiting, or diarrhea.  According to chart review it appears the patient's wife called Dr. Naida Sleight office today saying that he sleeps all day, that his heart rate was into the 20s on his new oximeter, his oxygen was dropping into the 60s, not eating, not drinking.  Patient does report that he had a reading of 30 on his pulse oximeter however he is unable to tell me if that was a prolonged heart rate or if that was a brief event.    HPI     Past Medical History:  Diagnosis Date  . Arthritis   . Atrial fibrillation ([place])   . 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 GERD (gastroesophageal reflux disease)   . Kidney stones   . Obesity     Patient Active Problem List   Diagnosis Date Noted  . Healthcare maintenance 08/16/2019  . Other  secondary pulmonary hypertension ([place]) 02/14/2019  . 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 Bilateral lower extremity edema 07/04/2018  . Sclerosis of the skin 06/26/2018  . Pulmonary emphysema ([place]) 05/22/2018  . Insomnia 05/22/2018  . Erectile dysfunction 05/22/2018  . Obesity   . Kidney stones   . Essential hypertension   . GERD (gastroesophageal reflux disease)   . Former smoker   . Arthritis   . 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 OSA treated with BiPAP 06/17/2013  . 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 Atrial fibrillation ([place]) 06/17/2013  . Rosacea 06/17/2013  . 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 Pulmonary embolism ([place]) 12/05/1998    Past Surgical History:  Procedure Laterality Date  . APPENDECTOMY  1963  . LITHOTRIPSY  1987  . NOSE SURGERY  2003  . TONSILLECTOMY AND ADENOIDECTOMY  1948  . UMBILICAL HERNIA REPAIR  1984       Family History  Problem Relation Age of Onset  . Arthritis Mother   . Hearing loss Mother   . Hypertension Mother   . Heart disease Mother   . Stroke Mother   . Arthritis Father   . Hearing loss Father   . Heart  disease Father   . Hypertension Father   . Heart attack Father   . Kidney disease Father   . Stroke Father     Social History   Tobacco Use  . Smoking status: Former Smoker    Packs/day: 3.00    Years: 25.00    Pack years: 75.00    Types: Cigarettes    Quit date: 11/28/1984    Years since quitting: 35.0  . Smokeless tobacco: Never Used  Substance Use Topics  . Alcohol use: Never  . Drug use: Never    Home Medications Prior to Admission medications   Medication Sig Start Date End Date Taking? Authorizing Provider  Ascorbic Acid (VITAMIN C) 500 MG CAPS Take 1 capsule by mouth daily.    [provider]  Atropine Sulfate-NaCl 0.01-0.9 %  SOLN Apply 1 drop to eye at bedtime.     [provider]  Cholecalciferol (VITAMIN D3) 3000 units TABS Take by mouth.    [provider]  clobetasol (TEMOVATE) 0.05 % external solution APPLY TO AFFECTED AREAS ON SCALP ONE TO TWO TIMES A DAY AS NEEDED 12/12/18   Inda Coke, PA  desonide (DESOWEN) 0.05 % lotion Apply topically. 07/20/12   [provider]  docusate sodium (COLACE) 100 MG capsule Take 100 mg by mouth daily.     [provider]  doxycycline (VIBRA-TABS) 100 MG tablet TAKE 1 TABLET BY MOUTH  DAILY 11/27/19   Inda Coke, PA  ELIQUIS 5 MG TABS tablet TAKE 1 TABLET BY MOUTH  TWICE DAILY 10/07/19   Inda Coke, PA  erythromycin ophthalmic ointment APP THIN LAYER IN OS BID 05/01/18   [provider]  MAGNESIUM-OXIDE 400 (241.3 Mg) MG tablet TAKE 1 TABLET BY MOUTH TWICE DAILY 11/25/19   Inda Coke, PA  metoprolol succinate (TOPROL-XL) 25 MG 24 hr tablet TAKE 1 TABLET BY MOUTH  DAILY 10/07/19   Inda Coke, PA  mometasone (NASONEX) 50 MCG/ACT nasal spray Place 2 sprays into the nose daily. Patient taking differently: Place 2 sprays into the nose as needed.  03/05/19   Rigoberto Noel, MD  NARCAN 4 MG/0.1ML LIQD nasal spray kit INSTILL ONE SPRAY PRN FOR ACCIDENTAL OVERDOSE 08/22/18   [provider]  oxyCODONE (OXY IR/ROXICODONE) 5 MG immediate release tablet Take 5 mg by mouth 2 (two) times daily as needed. for pain 04/23/18   [provider]  potassium chloride (MICRO-K) 10 MEQ CR capsule TAKE 1 CAPSULE BY MOUTH  TWICE DAILY 10/07/19   Inda Coke, PA  prednisoLONE acetate (PRED FORTE) 1 % ophthalmic suspension Place 1 drop into the left eye 2 (two) times daily.  04/25/19   [provider]  tadalafil (CIALIS) 20 MG tablet Take 0.5-1 tablets (10-20 mg total) by mouth every other day as needed for erectile dysfunction. 08/23/19   Inda Coke, PA  Tafluprost (ZIOPTAN) 0.0015 % SOLN Apply 1 drop to eye  at bedtime. Right eye 07/10/17   [provider]  tamsulosin (FLOMAX) 0.4 MG CAPS capsule TAKE 1 CAPSULE BY MOUTH AT  BEDTIME 10/07/19   Inda Coke, PA  temazepam (RESTORIL) 15 MG capsule Take 1 capsule (15 mg total) by mouth at bedtime as needed for sleep. 11/18/19   Inda Coke, PA  torsemide (DEMADEX) 20 MG tablet TAKE 1 TABLET BY MOUTH TWO  TIMES DAILY 11/18/19   Inda Coke, PA  triamcinolone cream (KENALOG) 0.1 % APPLY AA BID 08/21/18   [provider]    Allergies  Other  Review of Systems   Review of Systems  Constitutional: Positive for appetite change and fatigue. Negative for chills and fever.  HENT: Negative for congestion.        Hard of hearing at baseline  Eyes: Negative for visual disturbance.  Respiratory: Positive for shortness of breath (Worsening DOE). Negative for chest tightness.   Cardiovascular: Positive for leg swelling. Negative for chest pain and palpitations.  Gastrointestinal: Positive for abdominal pain (Chronic, unchanged). Negative for constipation, diarrhea, nausea and vomiting.  Genitourinary: Negative for dysuria.  Musculoskeletal: Negative for back pain, neck pain and neck stiffness.  Skin: Negative for color change, rash and wound.  Neurological: Positive for weakness (Generalized). Negative for headaches.  Psychiatric/Behavioral: Positive for confusion.  All other systems reviewed and are negative.   Physical Exam Updated Vital Signs BP 105/74 (BP Location: Right Arm)   Pulse 96   Temp 97.8 F (36.6 C) (Oral)   Resp 14   Ht 5' 9.5" (1.765 m)   Wt 117 kg   SpO2 96%   BMI 37.55 kg/m   Physical Exam Vitals and nursing note reviewed.  Constitutional:      Appearance: He is well-developed.  HENT:     Head: Normocephalic and atraumatic.  Eyes:     Conjunctiva/sclera: Conjunctivae normal.  Cardiovascular:     Rate and Rhythm: Normal rate. Rhythm irregularly irregular.     Pulses: Normal pulses.           Radial pulses are 2+ on the right side and 2+ on the left side.       Dorsalis pedis pulses are 2+ on the right side and 2+ on the left side.     Heart sounds: Normal heart sounds. No murmur.  Pulmonary:     Effort: Pulmonary effort is normal. No respiratory distress.     Breath sounds: Normal breath sounds.  Abdominal:     General: Bowel sounds are normal.     Palpations: Abdomen is soft.     Tenderness: There is no abdominal tenderness.  Musculoskeletal:     Cervical back: Neck supple.     Right lower leg: 3+ Edema present.     Left lower leg: 3+ Edema present.  Skin:    General: Skin is warm and dry.  Neurological:     Mental Status: He is alert.     Motor: No weakness.     Comments: Hard of hearing at baseline.  He is awake and alert.  He answers questions appropriately with out slurred speech.    Psychiatric:        Mood and Affect: Mood normal.        Behavior: Behavior normal.     ED Results / Procedures / Treatments   Labs (all labs ordered are listed, but only abnormal results are displayed) Labs Reviewed  COMPREHENSIVE METABOLIC PANEL - Abnormal; Notable for the following components:      Result Value   Chloride 96 (*)    CO2 34 (*)    Glucose, Bld 105 (*)    All other components within normal limits  CBC WITH DIFFERENTIAL/PLATELET - Abnormal; Notable for the following components:   MCV 105.9 (*)    MCH 34.7 (*)    All other components within normal limits  SARS CORONAVIRUS 2 (TAT 6-24 HRS)  BRAIN NATRIURETIC PEPTIDE  PROTIME-INR  LIPASE, BLOOD  MAGNESIUM  POC SARS CORONAVIRUS 2 AG -  ED  TROPONIN I (HIGH SENSITIVITY)  TROPONIN I (HIGH  SENSITIVITY)  TROPONIN I (HIGH SENSITIVITY)    EKG EKG Interpretation  Date/Time:  Tuesday December 17 2019 15:22:35 EST Ventricular Rate:  85 PR Interval:    QRS Duration: 104 QT Interval:  401 QTC Calculation: 477 R Axis:   0 Text Interpretation: Atrial fibrillation Borderline T abnormalities, inferior leads  Borderline prolonged QT interval No STEMI, A fib seen on prior ECG Confirmed by Octaviano Glow [phone]) on 12/17/2019 4:15:06 PM   Radiology CT Angio Chest PE W/Cm &/Or Wo Cm  Result Date: 12/17/2019 CLINICAL DATA:  Atrial fibrillation.  Dizziness. EXAM: CT ANGIOGRAPHY CHEST WITH CONTRAST TECHNIQUE: Multidetector CT imaging of the chest was performed using the standard protocol during bolus administration of intravenous contrast. Multiplanar CT image reconstructions and MIPs were obtained to evaluate the vascular anatomy. CONTRAST:  44m OMNIPAQUE IOHEXOL 350 MG/ML SOLN COMPARISON:  None. FINDINGS: Cardiovascular: No filling defects in the pulmonary arteries to suggest pulmonary emboli. Diffuse aortic and coronary artery atherosclerotic calcifications. Cardiomegaly. No evidence of aortic aneurysm or dissection. Mediastinum/Nodes: No mediastinal, hilar, or axillary adenopathy. Trachea and esophagus are unremarkable. Thyroid unremarkable. Lungs/Pleura: Bilateral calcified pleural plaques. Ground-glass opacities in the lower lungs likely reflects atelectasis. Areas of scarring in the lungs bilaterally. No effusions. Upper Abdomen: Imaging into the upper abdomen shows no acute findings. Musculoskeletal: Chest wall soft tissues are unremarkable. No acute bony abnormality. Review of the MIP images confirms the above findings. IMPRESSION: Cardiomegaly, coronary artery disease. No evidence of pulmonary embolus. Bilateral calcified pleural plaques. Areas of scarring in the lungs bilaterally. Bibasilar atelectasis. Aortic Atherosclerosis (ICD10-I70.0). Electronically Signed   By: KRolm BaptiseM.D.   On: 12/17/2019 21:03   DG Chest Port 1 View  Result Date: 12/17/2019 CLINICAL DATA:  Shortness of breath EXAM: PORTABLE CHEST 1 VIEW COMPARISON:  CT 09/17/2019, radiograph 01/22/2019 FINDINGS: Bilateral fibrotic and calcific changes of pleura are similar to prior. May obscure underlying processes. Coarse opacities in the  lung bases and basilar periphery are similar to prior. No acute consolidative process is clearly evident. No visible pneumothorax. Abundant mediastinal fat, better assessed on comparison CT. The aorta is calcified. The remaining cardiomediastinal contours are unremarkable. The osseous structures appear diffusely demineralized which may limit detection of small or nondisplaced fractures. Degenerative changes are present in the imaged spine and shoulders. No acute osseous or soft tissue abnormality. IMPRESSION: 1. No acute consolidative process. 2. Unchanged appearance of bilateral pleuroparenchymal scarring fibrosis. 3. Abundant mediastinal fat, better assessed on comparison CT. 4.  Aortic Atherosclerosis (ICD10-I70.0). Electronically Signed   By: PLovena LeM.D.   On: 12/17/2019 16:34    Procedures Procedures (including critical care time)  Medications Ordered in ED Medications  iohexol (OMNIPAQUE) 350 MG/ML injection 75 mL (75 mLs Intravenous Contrast Given 12/17/19 2053)    ED Course  I have reviewed the triage vital signs and the nursing notes.  Pertinent labs & imaging results that were available during my care of the patient were reviewed by me and considered in my medical decision making (see chart for details).  Clinical Course as of Dec 16 2318  Tue Dec 17, 2019  1742 Normal  B Natriuretic Peptide: 65.0 [EH]  2121 Patient seen by myself as well as PA provider.  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  Patient reports worsening shortness of breath fatigue with daily activities.  He states that his family care doctor recently doubled his torsemide dose at  home and he has been urinating.  He does not report putting on weight.  He denies any new swelling in his legs and wears compression stockings.  He denies any worsening orthopnea.  He says he just generally feels fatigued and at times lightheaded.   Today he was concerned when the pulse ox on his finger reported a number of 23%.  My exam the patient appears quite comfortable.  He is on his baseline 3 L nasal cannula and satting well on room air.  He has minimal crackles in the base of his lungs.  He has symmetrical pitting edema in the lower legs.  He does not clinically examine volume overloaded.  His labs show an unremarkable delta troponin and unremarkable BMP.  His CT PE study is negative.  Have a very low suspicion for sepsis or acute coronary syndrome at this time.  His initial rapid Covid test was negative.  Given that he is stable on his baseline oxygen, I believe it is reasonable to send a Covid PCR, and discharge the patient to follow-up with his primary care doctor.   [MT]  2123 This note was dictated using dragon dictation software.  Please be aware that there may be minor translation errors as a result of this oral dictation     [MT]  2150 Patient was able to ambulate in the emergency room without desaturation or significant difficulties.   [EH]    Clinical Course User Index [EH] Lorin Glass, PA-C [MT] Langston Masker Carola Rhine, MD   MDM Rules/Calculators/A&P                     Patient presents today for evaluation of generally feeling weak and unwell worsening over the past 2+ weeks.  He initially presented today as he checked his heart rate on his pulse oximeter this morning and reported it was 23.  Here he is in [name] with heart rates in the 80s to low 90s. He is on his baseline 3 L of oxygen. Labs are obtained and reviewed, he does not have a significant leukocytosis or anemia. High-sensitivity troponin x2 -. EKG without evidence of ischemia or other acute abnormalities, do not suspect ACS. CT scan was obtained to evaluate for PE without evidence of PE or other acute abnormalities found.  BNP is not significantly elevated, he does not have pulmonary edema on chest x-ray or CT scan therefore lower suspicion for heart  failure.  Covid antigen test was negative, Covid PCR testing was sent.  Patient was able to ambulate in the room without desaturation on his baseline oxygen.  This patient was seen as a shared visit with Dr. Langston Masker.   Return precautions were discussed with patient who states their understanding.  At the time of discharge patient denied any unaddressed complaints or concerns.  Patient is agreeable for discharge home.  Note: Portions of this report may have been transcribed using voice recognition software. Every effort was made to ensure accuracy; however, inadvertent computerized transcription errors may be present   Final Clinical Impression(s) / ED Diagnoses Final diagnoses:  Feeling weak  Shortness of breath  Centrilobular emphysema [facility])    Rx / DC Orders ED Discharge Orders    None       Ollen Gross 12/17/19 2324    Wyvonnia Dusky, MD 12/18/19 1057

## 2019-12-17 NOTE — ED Notes (Signed)
Patient ambulated with a steady gait. O2 Sats were 96% room air. PA-C Lanora Manis has been notified.

## 2019-12-17 NOTE — Telephone Encounter (Signed)
Cardiology [facility] Note  Returned page from patient's wife. Patient was seen in the ED earlier as he is was feeling generally unwell (fatigued, weak). Wife states he was having HR's in the 20's at home on their new pulse oximeter, though she doesn't know whether these readings were correct. Patient has no chest pain/pressure, syncope, presyncope, or worsening SOB. Vital signs in the ED have all been normal after 7 hours of monitoring. No arrhythmias noted. No hypotensive episodes. Labs all normal. CT-PE scan without acute PE. ECG showing rate controlled AF. Wife states he has been in AF for several days now and this is the longest he's ever been in AF.   I told wife I would send a note to Dr. Allyson Sabal to see if he could see the patient in short interval follow up. Wonder whether his AF is contributing to his symptoms. He has never been cardioverted that she can remember. She understands she should bring him back to the ED tonight with any new or worsening symptoms.   Rosario Jacks, MD Cardiology Fellow, PGY-7

## 2019-12-17 NOTE — ED Triage Notes (Signed)
Pt BIB EMS from home. Pt has a pmh of a-fib and reports he has been in afib for at least a week. Pt reports that he checked his heart rate on his pulse ox this morning and it said 23, reports he then started to have some dizziness and weakness. Pt reports he called his doctor and they reported he needed to come to the ED for evaluation. Pt heart rate in the 90's upon ED arrival.

## 2019-12-18 ENCOUNTER — Ambulatory Visit: Payer: Medicare Other | Admitting: General Practice

## 2019-12-18 ENCOUNTER — Telehealth: Payer: Self-pay | Admitting: Cardiovascular Disease

## 2019-12-18 LAB — SARS CORONAVIRUS 2 (TAT 6-24 HRS): SARS Coronavirus 2: NEGATIVE

## 2019-12-18 NOTE — Telephone Encounter (Signed)
Pt and pt wife wanted to be seen sooner d/t concerns. States that she did not think the pt should've been released from the hospital yesterday. Moved appt up from 1/20 to tomorrow, 1/14 with Micah Flesher.

## 2019-12-18 NOTE — Telephone Encounter (Signed)
[id number] 

## 2019-12-18 NOTE — Progress Notes (Signed)
Cardiology Office Note:    Date:  12/19/2019   ID:  KREIG PARSON, DOB 07-14-1941, MRN [id number]  PCP:  Inda Coke, PA  Cardiologist:  Quay Burow, MD   Referring MD: Inda Coke, [place]   Chief Complaint  Patient presents with  . Follow-up    fluctuations in heart rate    History of Present Illness:    Maurice Reed is a 79 y.o. 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  He has a history of bilateral lower extremity edema on torsemide. He was last seen in clinic by Dr. Gwenlyn Found on 09/18/19 and was doing well at that time. He saw his PCP last week with abdominal pain and dizziness. His PCP increased his torsemide to 40 mg BID x 1 week.  His wife called our office and reported that his heart rate drops into the 20s and O2 sats are dropping into the 60s on 2L of O2. He has been short of breath, dizzy, and lethargic. They were instructed to go to the ER. He presented to the ER on 12/17/19. On presentation, he was not hypervolemic and BNP was normal. CTA negative for PE. COVID-19 test was negative. HS troponin x 2 negative. TSH was recently normal.   He presents today for ER follow up. Wife reports they have been taking his pulse using pulse Ox unit as low as in the 30s. We discussed that this can be inaccurate in afib. She has also recorded manual pulses in the 40-50s. When he ambulates, his heart rate has been noted to decrease in the 40-50s. At other times, his heart rate is in the 100s. He has increased fatigue and has been sleeping more than usual. No syncope, but episodes of dizziness.   In addition, his O2 sats have been labile. They report O2 sats in the 70s with exertion, which is new for him. He reports gaining 20 lbs in the last three months. He has lost 8 lbs since doubling torsemide to 40 mg BID on 12/14/19. BNP on 12/13/19 and 12/17/19 was normal.    Past Medical History:  Diagnosis Date    . Arthritis   . Atrial fibrillation ([place])   . 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 GERD (gastroesophageal reflux disease)   . Kidney stones   . Obesity     Past Surgical History:  Procedure Laterality Date  . APPENDECTOMY  1963  . LITHOTRIPSY  1987  . NOSE SURGERY  2003  . TONSILLECTOMY AND ADENOIDECTOMY  1948  . UMBILICAL HERNIA REPAIR  1984    Current Medications: Current Meds  Medication Sig  . Ascorbic Acid (VITAMIN C) 500 MG CAPS Take 1 capsule by mouth daily.  . Atropine Sulfate-NaCl 0.01-0.9 % SOLN Apply 1 drop to eye at bedtime.   . Cholecalciferol (VITAMIN D3) 3000 units TABS Take by mouth.  . clobetasol (TEMOVATE) 0.05 % external solution APPLY TO AFFECTED AREAS ON SCALP ONE TO TWO TIMES A DAY AS NEEDED  . desonide (DESOWEN) 0.05 % lotion Apply topically.  . docusate sodium (COLACE) 100 MG capsule Take 100 mg by mouth daily.   Marland Kitchen doxycycline (VIBRA-TABS) 100 MG tablet TAKE 1 TABLET BY MOUTH  DAILY  . ELIQUIS 5 MG TABS tablet TAKE 1 TABLET BY MOUTH  TWICE DAILY  . erythromycin ophthalmic ointment APP THIN LAYER IN OS BID  .  MAGNESIUM-OXIDE 400 (241.3 Mg) MG tablet TAKE 1 TABLET BY MOUTH TWICE DAILY  . metoprolol succinate (TOPROL-XL) 25 MG 24 hr tablet TAKE 1 TABLET BY MOUTH  DAILY  . mometasone (NASONEX) 50 MCG/ACT nasal spray Place 2 sprays into the nose daily. (Patient taking differently: Place 2 sprays into the nose as needed. )  . NARCAN 4 MG/0.1ML LIQD nasal spray kit INSTILL ONE SPRAY PRN FOR ACCIDENTAL OVERDOSE  . oxyCODONE (OXY IR/ROXICODONE) 5 MG immediate release tablet Take 5 mg by mouth 2 (two) times daily as needed. for pain  . potassium chloride (MICRO-K) 10 MEQ CR capsule TAKE 1 CAPSULE BY MOUTH  TWICE DAILY  . prednisoLONE acetate (PRED FORTE) 1 % ophthalmic suspension Place 1 drop into the left eye 2 (two) times daily.   . tadalafil (CIALIS) 20 MG tablet Take 0.5-1 tablets (10-20 mg total) by mouth every other day as needed for  erectile dysfunction.  . Tafluprost (ZIOPTAN) 0.0015 % SOLN Apply 1 drop to eye at bedtime. Right eye  . tamsulosin (FLOMAX) 0.4 MG CAPS capsule TAKE 1 CAPSULE BY MOUTH AT  BEDTIME  . temazepam (RESTORIL) 15 MG capsule Take 1 capsule (15 mg total) by mouth at bedtime as needed for sleep.  Marland Kitchen torsemide (DEMADEX) 20 MG tablet TAKE 1 TABLET BY MOUTH TWO  TIMES DAILY  . triamcinolone cream (KENALOG) 0.1 % APPLY AA BID     Allergies:   Other   Social History   Socioeconomic History  . Marital status: Married    Spouse name: Not on file  . Number of children: Not on file  . Years of education: Not on file  . Highest education level: Not on file  Occupational History  . Not on file  Tobacco Use  . Smoking status: Former Smoker    Packs/day: 3.00    Years: 25.00    Pack years: 75.00    Types: Cigarettes    Quit date: 11/28/1984    Years since quitting: 35.0  . Smokeless tobacco: Never Used  Substance and Sexual Activity  . Alcohol use: Never  . Drug use: Never  . Sexual activity: Yes  Other Topics Concern  . 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 Difficulty of Paying Living Expenses: Not on file  Food Insecurity:   . Worried About Charity fundraiser in the Last Year: Not on file  . Ran Out of Food in the Last Year: Not on file  Transportation Needs:   . Lack of Transportation (Medical): Not on file  . Lack of Transportation (Non-Medical): Not on file  Physical Activity:   . Days of Exercise per Week: Not on file  . Minutes of Exercise per Session: Not on file  Stress:   . Feeling of Stress : Not on file  Social  Connections:   . Frequency of Communication with Friends and Family: Not on file  . Frequency of Social Gatherings with Friends and Family: Not on file  . Attends Religious Services: Not on file  . Active Member of Clubs or Organizations: Not on  file  . Attends Archivist Meetings: Not on file  . Marital Status: Not on file     Family History: The patient's family history includes Arthritis in his father and mother; Hearing loss in his father and mother; Heart attack in his father; Heart disease in his father and mother; Hypertension in his father and mother; Kidney disease in his father; Stroke in his father and mother.  ROS:   Please see the history of present illness.     All other systems reviewed and are negative.  EKGs/Labs/Other Studies Reviewed:    The following studies were reviewed today:  Echo 03/15/19:  1. The left ventricle has normal systolic function with an ejection fraction of 60-65%. The cavity size was normal. Left ventricular diastolic Doppler parameters are indeterminate.  2. The right ventricle has normal systolic function. The cavity was normal. There is no increase in right ventricular wall thickness.  3. Left atrial size was moderately dilated.  4. Mild thickening of the mitral valve leaflet. Mild calcification of the mitral valve leaflet.  5. The aortic valve has an indeterminate number of cusps. Severely thickening of the aortic valve. Sclerosis without any evidence of stenosis of the aortic valve.  6. Severe sclerosis of the non and left cusps no no stenosis by CW doppler.  7. The aortic root is normal in size and structure.  EKG:  EKG is ordered today.  The ekg ordered today demonstrates rate controlled Afib, ventricular rate 94  Recent Labs: 12/13/2019: TSH 2.85 12/17/2019: ALT 18; B Natriuretic Peptide 65.0; BUN 17; Creatinine, Ser 0.94; Hemoglobin 14.7; Magnesium 2.0; Platelets 208; Potassium 3.8; Sodium 139  Recent Lipid Panel    Component  Value Date/Time   CHOL 201 (H) 07/04/2018 1005   TRIG 171 (H) 07/04/2018 1005   HDL 59 07/04/2018 1005   CHOLHDL 3.4 07/04/2018 1005   LDLCALC 108 (H) 07/04/2018 1005    Physical Exam:    VS:  BP 139/81   Pulse 81   Temp 98.2 F (36.8 C)   Ht '5\' 11"'  (1.803 m)   Wt 275 lb 6.4 oz (124.9 kg)   SpO2 98%   BMI 38.41 kg/m     Wt Readings from Last 3 Encounters:  12/19/19 275 lb 6.4 oz (124.9 kg)  12/17/19 258 lb (117 kg)  12/13/19 281 lb (127.5 kg)     GEN: obese male, NAD HEENT: Normal NECK: No JVD; No carotid bruits LYMPHATICS: No lymphadenopathy CARDIAC: irregular rhythm, regular rate RESPIRATORY:  Respirations unlabored ABDOMEN: Soft, non-tender, non-distended MUSCULOSKELETAL:  No edema; No deformity  SKIN: Warm and dry NEUROLOGIC:  Alert and oriented x 3 PSYCHIATRIC:  Normal affect   ASSESSMENT:    1. Essential hypertension   2. Longstanding persistent atrial fibrillation ([place])   3. Bradycardia   4. Chronic respiratory failure with hypoxia (H[facility])   5. Pulmonary embolism, unspecified chronicity, unspecified pulmonary embolism type, unspecified whether acute cor pulmonale present ([place])   6. Hypertension, unspecified type    PLAN:    In order of problems listed above:  Persistent Atrial fibrillation Episodes of tachycardia and bradycardia - I am concerned about new tachy-brady syndrome vs emerging heart block - since I do not have evidence of this, I placed a 14-day zio patch - I will hold off on medication changes for now and instructed him to stay close to home while we are evaluating his heart rate and rhythm - continue eliquis   Chronic bilateral lower extremity edema Chronic  diastolic heart failure - he has been on increased torsemide of 40 mg BID since 12/14/19 and has felt much better - has gained 20 lbs over the past three months, has lost 8 lbs on increased torsemide - they report no changes in eating - he appears euvolemic on exam, no lower extremity  swelling, lungs sound clear - will check BMP and BNP again   PE/DVT - continue eliquis - CTA negative for PE   OSA on BiPAP - Compliant    Hypertension - continue current medications,  No changes   Follow up after zio patch.    Medication Adjustments/Labs and Tests Ordered: Current medicines are reviewed at length with the patient today.  Concerns regarding medicines are outlined above.  Orders Placed This Encounter  Procedures  . Basic metabolic panel  . Brain natriuretic peptide  . LONG TERM MONITOR (3-14 DAYS)   No orders of the defined types were placed in this encounter.   Signed, Ledora Bottcher, PA  12/19/2019 5:02 PM    [facility]

## 2019-12-18 NOTE — Telephone Encounter (Signed)
Needs F/U with APP next week

## 2019-12-18 NOTE — Telephone Encounter (Signed)
Patient's wife is calling with concerns regarding time frame that patient is able to be seen. Patient's wife states that since patient was discharged from hospital he has had some drastic heart health changes. Patient's wife also states patient is in 10th day of afib. Wife states that he needs to be seen sooner than appointment scheduled for 12/25/19. Please call.

## 2019-12-19 ENCOUNTER — Ambulatory Visit: Payer: Medicare Other | Admitting: Physician Assistant

## 2019-12-19 ENCOUNTER — Encounter: Payer: Self-pay | Admitting: Physician Assistant

## 2019-12-19 ENCOUNTER — Telehealth: Payer: Self-pay | Admitting: *Deleted

## 2019-12-19 ENCOUNTER — Other Ambulatory Visit: Payer: Self-pay

## 2019-12-19 VITALS — BP 139/81 | HR 81 | Temp 98.2°F | Ht 71.0 in | Wt 275.4 lb

## 2019-12-19 DIAGNOSIS — I10 Essential (primary) hypertension: Secondary | ICD-10-CM

## 2019-12-19 DIAGNOSIS — J9611 Chronic respiratory failure with hypoxia: Secondary | ICD-10-CM

## 2019-12-19 DIAGNOSIS — R001 Bradycardia, unspecified: Secondary | ICD-10-CM

## 2019-12-19 DIAGNOSIS — I4811 Longstanding persistent atrial fibrillation: Secondary | ICD-10-CM | POA: Diagnosis not present

## 2019-12-19 DIAGNOSIS — I2699 Other pulmonary embolism without acute cor pulmonale: Secondary | ICD-10-CM

## 2019-12-19 NOTE — Patient Instructions (Signed)
Medication Instructions:  Your physician recommends that you continue on your current medications as directed. Please refer to the Current Medication list given to you today.  *If you need a refill on your cardiac medications before your next appointment, please call your pharmacy*  Lab Work: Your physician recommends that you return for lab work today: BMET, BNP  If you have labs (blood work) drawn today and your tests are completely normal, you will receive your results only by: Marland Kitchen MyChart Message (if you have MyChart) OR . A paper copy in the mail If you have any lab test that is abnormal or we need to change your treatment, we will call you to review the results.  Testing/Procedures: Your physician has recommended that you wear a 14 day ZIO patch monitor. Monitors are medical devices that record the heart's electrical activity. Doctors most often [place] these monitors to diagnose arrhythmias. Arrhythmias are problems with the speed or rhythm of the heartbeat. The monitor is a small, portable device. You can wear one while you do your normal daily activities. This is usually used to diagnose what is causing palpitations/syncope (passing out).  Follow-Up: At [facility], you and your health needs are our priority.  As part of our continuing mission to provide you with exceptional heart care, we have created designated Provider Care Teams.  These Care Teams include your primary Cardiologist (physician) and Advanced Practice Providers (APPs -  Physician Assistants and Nurse Practitioners) who all work together to provide you with the care you need, when you need it.  Your next appointment:   Tuesday, 01/28/20 at 2:00 PM.  The format for your next appointment:   In Person  Provider:   [name], MD

## 2019-12-19 NOTE — Telephone Encounter (Signed)
Patient enrolled for 14 day ZIO XT long term holter monitor to be mailed to his PO BOX.  Instructions reviewed briefly as they are included in the monitor kit.

## 2019-12-20 ENCOUNTER — Inpatient Hospital Stay: Payer: Medicare Other | Admitting: Pulmonary Disease

## 2019-12-20 LAB — BASIC METABOLIC PANEL
BUN/Creatinine Ratio: 13 (ref 10–24)
BUN: 14 mg/dL (ref 8–27)
CO2: 32 mmol/L — ABNORMAL HIGH (ref 20–29)
Calcium: 9.7 mg/dL (ref 8.6–10.2)
Chloride: 94 mmol/L — ABNORMAL LOW (ref 96–106)
Creatinine, Ser: 1.11 mg/dL (ref 0.76–1.27)
GFR calc Af Amer: 73 mL/min/{1.73_m2} (ref >59)
GFR calc non Af Amer: 63 mL/min/{1.73_m2} (ref >59)
Glucose: 154 mg/dL — ABNORMAL HIGH (ref 65–99)
Potassium: 3.8 mmol/L (ref 3.5–5.2)
Sodium: 141 mmol/L (ref 134–144)

## 2019-12-20 LAB — BRAIN NATRIURETIC PEPTIDE: BNP: 39.5 pg/mL (ref 0.0–100.0)

## 2019-12-22 NOTE — Progress Notes (Signed)
_0  ID: Maurice Reed, male    DOB: 1941-03-23, 79 y.o.   MRN: [id number]  Chief Complaint  Patient presents with  . Hospitalization Follow-up    Was in afib for 3 weeks before ED visit. Increased his O2 to 3L pulse. Increased SOB. Increased fatigue per wife.     Referring provider: Inda Coke, PA  HPI:  79 year old male former smoker initially referred to our office on 05/29/2018 to be established with a local pulmonologist for management of COPD as well as obstructive sleep apnea. Patient is currently on BiPAP. Chronic respiratory failure. Also history of asbestosis  Past medical history: History of DVT and PE in 2002, 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 50-pack-year smoking history. Maintenance: None Patient of Dr. [name]  12/23/2019  - Visit   79 year old male former smoker followed in our office for COPD.  Patient also followed in our office for obstructive sleep apnea currently using a BiPAP.  Patient uses nighttime oxygen.  Patient has previous clinical history and exposure to asbestos dust.  Patient presenting today as a follow-up after being evaluated in the emergency room on 12/17/2019.  Patient was found to be in [name].  Covid rapid test was negative.  Covid PCR was sent, and negative.  Patient working with cardiology for a 21-day heart monitor.  Patient reporting today that with physical exertion his oxygen levels are dropping into the 80s. Patient is also quite symptomatic with lightheadedness, dizziness as well as unsteady gait. Walk today in office reveals patient did complete 1 lap oxygen levels were stable. Patient did become symptomatic with lightheadedness dizziness. This is a chronic finding but has worsened with this most recent bout of [name].  Patient's BiPAP compliance report shows excellent compliance. 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 Features of asbestosis areNOTidentified.   01/2018 titration >>bipap 18/12   11/2017 spiro ratio 86, FEV 1 45%, FVC 39 % , severe restriction ABG 12/2017 7.42/45/82/ 97% RA  PFT 02/2019 no airway obstruction, ratio 86, FEV1 60%, F VC 50%, no bronchodilator response, TLC 48%, DLCO 66% , corrects for alveolar volume-moderate intraparenchymal restriction  Echo 07/2018 RVSP 57  09/17/2019-CT chest high-res-calcified pleural plaques bilaterally, compatible with asbestosis related pleural disease, there are findings compatible with interstitial lung disease with spectrum of findings indeterminate for UIP, dilatation of pulmonary trunk concerning for associated pulmonary arterial hypertension  03/15/2019-echocardiogram-LV ejection fraction 60 to 65%, right ventricle has normal systolic function, left atrial size mildly dilated, mild thickening of the mitral valve leaflet, mild calcification of mitral valve leaflet, aortic valve has indeterminate number of cusp, severe thickening of the aortic valve, sclerosis without any evidence of stenosis in the aortic valve  FENO:  No results found for: NITRICOXIDE  PFT: PFT Results Latest Ref Rng & Units 02/14/2019  FVC-Pre L 2.05  FVC-Predicted Pre % 50  FVC-Post L 1.95  FVC-Predicted Post % 47  Pre FEV1/FVC % % 86  Post FEV1/FCV % % 86  FEV1-Pre L 1.76  FEV1-Predicted Pre % 60  FEV1-Post L 1.69  DLCO UNC% % 66  DLCO COR %Predicted % 146  TLC L 3.37  TLC %  Predicted % 48  RV % Predicted % 56    WALK:  SIX MIN WALK 12/23/2019 08/16/2019 01/24/2019 05/29/2018  Supplimental Oxygen during Test? (L/min) Yes No No No  O2 Flow Rate 2 - - -  Type Continuous - - -  Tech Comments:  Patient was able to complete 1 lap at a slow pace. Patient had to walk and hold onto the wall during walk for his balance. Halfway into lap he stated that he felt lightheaded but wanted to continue the walk. He was walked on 2L of O2. Did have some SOB but no chest or leg pain. Lightheadedness went away after patient was able to sit for a few minutes. Walked at moderate pace tolerated with no c/o of dizziness or shorness of breath. Discontinued walk due to elevated heart rate. moderate SOB upon return to room fast paced walk no SOb noted.    Imaging: CT Angio Chest PE W/Cm &/Or Wo Cm  Result Date: 12/17/2019 CLINICAL DATA:  Atrial fibrillation.  Dizziness. EXAM: CT ANGIOGRAPHY CHEST WITH CONTRAST TECHNIQUE: Multidetector CT imaging of the chest was performed using the standard protocol during bolus administration of intravenous contrast. Multiplanar CT image reconstructions and MIPs were obtained to evaluate the vascular anatomy. CONTRAST:  29m OMNIPAQUE IOHEXOL 350 MG/ML SOLN COMPARISON:  None. FINDINGS: Cardiovascular: No filling defects in the pulmonary arteries to suggest pulmonary emboli. Diffuse aortic and coronary artery atherosclerotic calcifications. Cardiomegaly. No evidence of aortic aneurysm or dissection. Mediastinum/Nodes: No mediastinal, hilar, or axillary adenopathy. Trachea and esophagus are unremarkable. Thyroid unremarkable. Lungs/Pleura: Bilateral calcified pleural plaques. Ground-glass opacities in the lower lungs likely reflects atelectasis. Areas of scarring in the lungs bilaterally. No effusions. Upper Abdomen: Imaging into the upper abdomen shows no acute findings. Musculoskeletal: Chest wall soft tissues are unremarkable. No acute bony abnormality. Review of the MIP images confirms the above findings. IMPRESSION: Cardiomegaly, coronary artery disease. No evidence of pulmonary embolus. Bilateral calcified pleural plaques. Areas of scarring in the lungs bilaterally. Bibasilar  atelectasis. Aortic Atherosclerosis (ICD10-I70.0). Electronically Signed   By: KRolm BaptiseM.D.   On: 12/17/2019 21:03   DG Chest Port 1 View  Result Date: 12/17/2019 CLINICAL DATA:  Shortness of breath EXAM: PORTABLE CHEST 1 VIEW COMPARISON:  CT 09/17/2019, radiograph 01/22/2019 FINDINGS: Bilateral fibrotic and calcific changes of pleura are similar to prior. May obscure underlying processes. Coarse opacities in the lung bases and basilar periphery are similar to prior. No acute consolidative process is clearly evident. No visible pneumothorax. Abundant mediastinal fat, better assessed on comparison CT. The aorta is calcified. The remaining cardiomediastinal contours are unremarkable. The osseous structures appear diffusely demineralized which may limit detection of small or nondisplaced fractures. Degenerative changes are present in the imaged spine and shoulders. No acute osseous or soft tissue abnormality. IMPRESSION: 1. No acute consolidative process. 2. Unchanged appearance of bilateral pleuroparenchymal scarring fibrosis. 3. Abundant mediastinal fat, better assessed on comparison CT. 4.  Aortic Atherosclerosis (ICD10-I70.0). Electronically Signed   By: PLovena LeM.D.   On: 12/17/2019 16:34    Lab Results:  CBC    Component Value Date/Time   WBC 6.1 12/17/2019 1630   RBC 4.24 12/17/2019 1630   HGB 14.7 12/17/2019 1630   HCT 44.9 12/17/2019 1630   PLT 208 12/17/2019 1630   MCV 105.9 (H) 12/17/2019 1630   MCH 34.7 (H) 12/17/2019 1630   MCHC 32.7 12/17/2019 1630   RDW 12.5 12/17/2019 1630   LYMPHSABS 1.5 12/17/2019 1630   MONOABS 0.5 12/17/2019  1630   EOSABS 0.1 12/17/2019 1630   BASOSABS 0.0 12/17/2019 1630    BMET    Component Value Date/Time   NA 141 12/19/2019 1634   K 3.8 12/19/2019 1634   CL 94 (L) 12/19/2019 1634   CO2 32 (H) 12/19/2019 1634   GLUCOSE 154 (H) 12/19/2019 1634   GLUCOSE 105 (H) 12/17/2019 1630   BUN 14 12/19/2019 1634   CREATININE 1.11 12/19/2019 1634    CREATININE 0.90 12/13/2019 1512   CALCIUM 9.7 12/19/2019 1634   GFRNONAA 63 12/19/2019 1634   GFRAA 73 12/19/2019 1634    BNP    Component Value Date/Time   BNP 39.5 12/19/2019 1634   BNP 65.0 12/17/2019 1630   BNP 67 12/13/2019 1512    ProBNP No results found for: PROBNP  Specialty Problems      Pulmonary Problems   OSA treated with BiPAP    01/23/2018 titration >> bipap 18/12  >>> controlled on 14/8 on download       Pulmonary emphysema [facility])    Former smoker 1958-85. Worked in [employer] all his life. CT on Jan 2019: 1. 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 Features of asbestosis are not identified.   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 Other Itching    Cats: watery eyes, itching, sneezing    Immunization History  Administered Date(s) Administered  . Fluad Quad(high Dose 65+) 08/23/2019  . Influenza, High Dose Seasonal PF 09/21/2018  . Influenza, Seasonal, Injecte, Preservative Fre 09/28/2017  . Pneumococcal Polysaccharide-23 12/14/2015    Past Medical History:  Diagnosis Date  . Arthritis   . Atrial fibrillation ([place])   . 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 GERD (gastroesophageal reflux disease)   . Kidney stones   . Obesity     Tobacco History: Social History   Tobacco  Use  Smoking Status Former Smoker  . Packs/day: 3.00  . Years: 25.00  . Pack years: 75.00  . Types: Cigarettes  . Quit date: 11/28/1984  . Years since quitting: 35.0  Smokeless Tobacco Never Used   Counseling given: Yes   Continue to not smoke  Outpatient Encounter Medications as of 12/23/2019  Medication Sig  . Ascorbic Acid (VITAMIN C) 500 MG CAPS Take 1 capsule by mouth daily.  . Atropine Sulfate-NaCl 0.01-0.9 % SOLN Apply 1 drop to eye at bedtime.   . Cholecalciferol (VITAMIN D3) 3000 units TABS Take by mouth.  . clobetasol (TEMOVATE) 0.05 % external solution APPLY TO AFFECTED AREAS ON SCALP ONE TO TWO TIMES A DAY AS NEEDED  . desonide (DESOWEN) 0.05 % lotion Apply topically.  . docusate sodium (COLACE) 100 MG capsule Take 100 mg by mouth daily.   Marland Kitchen doxycycline (VIBRA-TABS) 100 MG tablet TAKE 1 TABLET BY MOUTH  DAILY  . ELIQUIS 5 MG TABS tablet TAKE 1 TABLET BY MOUTH  TWICE DAILY  .  erythromycin ophthalmic ointment APP THIN LAYER IN OS BID  . MAGNESIUM-OXIDE 400 (241.3 Mg) MG tablet TAKE 1 TABLET BY MOUTH TWICE DAILY  . metoprolol succinate (TOPROL-XL) 25 MG 24 hr tablet TAKE 1 TABLET BY MOUTH  DAILY  . mometasone (NASONEX) 50 MCG/ACT nasal spray Place 2 sprays into the nose daily. (Patient taking differently: Place 2 sprays into the nose as needed. )  . NARCAN 4 MG/0.1ML LIQD nasal spray kit INSTILL ONE SPRAY PRN FOR ACCIDENTAL OVERDOSE  . oxyCODONE (OXY IR/ROXICODONE) 5 MG immediate release tablet Take 5 mg by mouth 2 (two) times daily as needed. for pain  . potassium chloride (MICRO-K) 10 MEQ CR capsule TAKE 1 CAPSULE BY MOUTH  TWICE DAILY  . prednisoLONE acetate (PRED FORTE) 1 % ophthalmic suspension Place 1 drop into the left eye 2 (two) times daily.   . tadalafil (CIALIS) 20 MG tablet Take 0.5-1 tablets (10-20 mg total) by mouth every other day as needed for erectile dysfunction.  . Tafluprost (ZIOPTAN) 0.0015 % SOLN Apply 1 drop to eye at bedtime. Right eye  . Tafluprost,  PF, 0.0015 % SOLN Apply 1 drop to eye daily. Drop 1 drop into right at night  . tamsulosin (FLOMAX) 0.4 MG CAPS capsule TAKE 1 CAPSULE BY MOUTH AT  BEDTIME  . temazepam (RESTORIL) 15 MG capsule Take 1 capsule (15 mg total) by mouth at bedtime as needed for sleep.  Marland Kitchen torsemide (DEMADEX) 20 MG tablet TAKE 1 TABLET BY MOUTH TWO  TIMES DAILY  . triamcinolone cream (KENALOG) 0.1 % APPLY AA BID   No facility-administered encounter medications on file as of 12/23/2019.     Review of Systems  Review of Systems  Constitutional: Positive for fatigue. Negative for activity change, chills, fever and unexpected weight change.  HENT: Negative for postnasal drip, rhinorrhea, sinus pressure, sinus pain and sore throat.   Eyes: Negative.   Respiratory: Positive for shortness of breath. Negative for cough and wheezing.   Cardiovascular: Negative for chest pain and palpitations.  Endocrine: Negative.   Genitourinary: Negative.   Musculoskeletal: Positive for gait problem.  Skin: Negative.   Neurological: Positive for light-headedness. Negative for dizziness and headaches.  Psychiatric/Behavioral: Negative.  Negative for dysphoric mood. The patient is not nervous/anxious.   All other systems reviewed and are negative.    Physical Exam  BP 122/78 (BP Location: Left Arm, Patient Position: Sitting, Cuff Size: Large)   Pulse 79   Temp 98.7 F (37.1 C) (Temporal)   Ht _0  (1.803 m)   Wt 277 lb 6.4 oz (125.8 kg)   SpO2 100% Comment: 3L pulse  BMI 38.69 kg/m   Wt Readings from Last 5 Encounters:  12/23/19 277 lb 6.4 oz (125.8 kg)  12/19/19 275 lb 6.4 oz (124.9 kg)  12/17/19 258 lb (117 kg)  12/13/19 281 lb (127.5 kg)  09/18/19 273 lb 6.4 oz (124 kg)    BMI Readings from Last 5 Encounters:  12/23/19 38.69 kg/m  12/19/19 38.41 kg/m  12/17/19 37.55 kg/m  12/13/19 41.50 kg/m  09/18/19 40.37 kg/m     Physical Exam Vitals and nursing note reviewed.  Constitutional:      General: He  is not in acute distress.    Appearance: Normal appearance. He is obese.     Comments: Chronically ill elderly male  HENT:     Head: Normocephalic and atraumatic.     Right Ear: Hearing and external ear normal.     Left Ear: Hearing and external  ear normal.     Nose: No mucosal edema.     Right Turbinates: Not enlarged.     Left Turbinates: Not enlarged.     Mouth/Throat:     Mouth: Mucous membranes are dry.     Pharynx: Oropharynx is clear. No oropharyngeal exudate.  Eyes:     Pupils: Pupils are equal, round, and reactive to light.  Cardiovascular:     Rate and Rhythm: Normal rate and regular rhythm.     Pulses:          Radial pulses are 1+ on the right side and 1+ on the left side.     Heart sounds: Normal heart sounds. No murmur.     Comments: Lower extremities have compression stockings applied bilaterally Pulmonary:     Effort: Pulmonary effort is normal.     Breath sounds: No decreased breath sounds, wheezing or rales.     Comments: Diminished breath sounds on exam Musculoskeletal:     Cervical back: Normal range of motion.     Right lower leg: 1+ Pitting Edema present.     Left lower leg: 1+ Pitting Edema present.  Lymphadenopathy:     Cervical: No cervical adenopathy.  Skin:    General: Skin is warm and dry.     Capillary Refill: Capillary refill takes less than 2 seconds.     Findings: No erythema or rash.  Neurological:     General: No focal deficit present.     Mental Status: He is alert and oriented to person, place, and time.     Motor: No weakness.     Coordination: Coordination normal.     Gait: Gait abnormal (Only able to complete 1 lap).  Psychiatric:        Mood and Affect: Mood normal.        Behavior: Behavior normal. Behavior is cooperative.        Thought Content: Thought content normal.        Judgment: Judgment normal.       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 Plan: Continue oxygen therapy at this time We will need to clinically monitor the patient for worsening symptoms with his breathing  Pulmonary emphysema ([place]) Plan: Continue oxygen therapy at this time Keep follow-up with our office with a 6-week follow-up  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. Patient is already working with cardiology to further evaluate this. Patient needs to keep follow-up with cardiology. We will keep close follow-up with the patient with a 6-week follow-up with Dr. [name]. Walk today reveals the patient was maintaining saturations above 90% with 2 L with physical  exertion. I have reviewed this with the patient. May need to consider repeat echocardiogram if symptoms are not improving.  Plan: 6-week follow-up with our office Continue to work with cardiology Maintain oxygen saturations     Return in about 6 weeks (around 02/03/2020), or if symptoms worsen or fail to improve, for Follow up with Dr. [name].   Lauraine Rinne, NP 12/23/2019   This appointment required 42 minutes of patient care (this  includes precharting, chart review, review of results, face-to-face care, etc.).

## 2019-12-23 ENCOUNTER — Ambulatory Visit: Payer: Medicare Other | Admitting: Pulmonary Disease

## 2019-12-23 ENCOUNTER — Encounter: Payer: Self-pay | Admitting: Pulmonary Disease

## 2019-12-23 ENCOUNTER — Other Ambulatory Visit: Payer: Self-pay

## 2019-12-23 VITALS — BP 122/78 | HR 79 | Temp 98.7°F | Ht 71.0 in | Wt 277.4 lb

## 2019-12-23 DIAGNOSIS — Z87891 Personal history of nicotine dependence: Secondary | ICD-10-CM

## 2019-12-23 DIAGNOSIS — J9611 Chronic respiratory failure with hypoxia: Secondary | ICD-10-CM | POA: Diagnosis not present

## 2019-12-23 DIAGNOSIS — J929 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: Secondary | ICD-10-CM

## 2019-12-23 DIAGNOSIS — J432 Centrilobular emphysema: Secondary | ICD-10-CM

## 2019-12-23 DIAGNOSIS — I2699 Other pulmonary embolism without acute cor pulmonale: Secondary | ICD-10-CM

## 2019-12-23 DIAGNOSIS — R0602 Shortness of breath: Secondary | ICD-10-CM | POA: Diagnosis not present

## 2019-12-23 DIAGNOSIS — I2729 Other secondary pulmonary hypertension: Secondary | ICD-10-CM

## 2019-12-23 DIAGNOSIS — I4811 Longstanding persistent atrial fibrillation: Secondary | ICD-10-CM

## 2019-12-23 DIAGNOSIS — G4733 Obstructive sleep apnea (adult) (pediatric): Secondary | ICD-10-CM

## 2019-12-23 NOTE — Assessment & Plan Note (Signed)
Plan: Continue oxygen therapy at this time Keep follow-up with our office with a 6-week follow-up

## 2019-12-23 NOTE — Assessment & Plan Note (Signed)
Discussion: Likely patient's worsening shortness of breath is directly related to [name]. Patient is already working with cardiology to further evaluate this. Patient needs to keep follow-up with cardiology. We will keep close follow-up with the patient with a 6-week follow-up with Dr. Vassie Loll. Walk today reveals the patient was maintaining saturations above 90% with 2 L with physical exertion. I have reviewed this with the patient. May need to consider repeat echocardiogram if symptoms are not improving.  Plan: 6-week follow-up with our office Continue to work with cardiology Maintain oxygen saturations

## 2019-12-23 NOTE — Assessment & Plan Note (Signed)
Walk today in office reveals patient was stable on 2 L of O2 with physical exertion but very symptomatic  Plan: Continue oxygen therapy as prescribed Maintain oxygen saturations above 88 to 90%

## 2019-12-23 NOTE — Assessment & Plan Note (Signed)
Plan: Continue to not smoke 

## 2019-12-23 NOTE — Assessment & Plan Note (Signed)
Likely WHO class II or III  Plan: Continue to monitor clinically Okay with continuing oxygen therapy between 2 to 3 L with physical exertion Keep follow-up with cardiology May need to consider repeat echocardiogram

## 2019-12-23 NOTE — Patient Instructions (Addendum)
You were seen today by Coral Ceo, NP  for:   1. Chronic respiratory failure with hypoxia (HCC) 2. Centrilobular emphysema (HCC) 3. 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 Longstanding persistent atrial fibrillation (HCC)  Continue to follow-up with cardiology Keep close follow-up with our office  5. OSA treated with BiPAP  Continue BiPAP at this time  We recommend that you continue using your biPAP daily >>>Keep up the hard work using your device >>> Goal should be wearing this for the entire night that you are sleeping, at least 4 to 6 hours  Remember:  . Do not drive or operate heavy machinery if tired or drowsy.  . Please notify the supply company and office if you are unable to use your device regularly due to missing supplies or machine being broken.  . Work on maintaining a healthy weight and following your recommended nutrition plan  . Maintain proper daily exercise and movement  . Maintaining proper use of your device can also help improve management of other chronic illnesses such as: Blood pressure, blood sugars, and weight management.   BiPAP/ CPAP Cleaning:  >>>Clean weekly, with Dawn soap, and bottle brush.  Set up to air dry. >>> Wipe mask out daily with wet wipe or towelette   Follow Up:    Return in about 6 weeks (around 02/03/2020), or if symptoms worsen or fail to improve, for Follow up with Dr. Vassie Loll.   Please do your part to reduce the spread of COVID-19:      Reduce your risk of any infection  and COVID19 by using the similar precautions used for avoiding the common cold or flu:  Marland Kitchen Wash your hands often with soap and warm water for at least 20 seconds.  If  soap and water are not readily available, use an alcohol-based hand sanitizer with at least 60% alcohol.  . If coughing or sneezing, cover your mouth and nose by coughing or sneezing into the elbow areas of your shirt or coat, into a tissue or into your sleeve (not your hands). Drinda Butts A MASK when in public  . Avoid shaking hands with others and consider head nods or verbal greetings only. . Avoid touching your eyes, nose, or mouth with unwashed hands.  . Avoid close contact with people who are sick. . Avoid places or events with large numbers of people in one location, like concerts or sporting events. . If you have some symptoms but not all symptoms, continue to monitor at home and seek medical attention if your symptoms worsen. . If you are having a medical emergency, call 911.   ADDITIONAL HEALTHCARE OPTIONS FOR PATIENTS  [place] [facility] / e-Visit: [email or web address]         [facility]: [phone]  [facility]: [phone]                   [facility]: [phone]     It is flu season:   >>> Best ways to protect herself from the flu: Receive the yearly flu vaccine, practice good hand hygiene washing with soap and also using hand sanitizer when available, eat a nutritious meals, get adequate rest,  hydrate appropriately   Please contact the office if your symptoms worsen or you have concerns that you are not improving.   Thank you for choosing [facility] for your healthcare, and for allowing [place] to partner with you on your healthcare journey. I am thankful to be able to provide care to you today.   Wyn Quaker FNP-C

## 2019-12-23 NOTE — Assessment & Plan Note (Signed)
Plan: Continue oxygen therapy at this time We will need to clinically monitor the patient for worsening symptoms with his breathing

## 2019-12-23 NOTE — Assessment & Plan Note (Signed)
Plan: Continue to work with cardiology Proceed forward with heart monitor Continue metoprolol Continue Eliquis Keep close follow-up with primary care and cardiology

## 2019-12-23 NOTE — Assessment & Plan Note (Signed)
Plan: Continue BiPAP 

## 2019-12-23 NOTE — Assessment & Plan Note (Signed)
History of VTE  Plan: Continue Eliquis

## 2019-12-25 ENCOUNTER — Ambulatory Visit: Payer: Medicare Other | Admitting: Cardiology

## 2019-12-26 ENCOUNTER — Ambulatory Visit
Admission: RE | Admit: 2019-12-26 | Discharge: 2019-12-26 | Disposition: A | Discharge status: Home / Self Care | Payer: Medicare Other | Source: Ambulatory Visit | Attending: Physician Assistant | Admitting: Physician Assistant

## 2019-12-26 DIAGNOSIS — R1030 Lower abdominal pain, unspecified: Secondary | ICD-10-CM

## 2019-12-26 IMAGING — CT CT ABD-PELV W/ CM
2 of 5 series · 15 of 46 positions shown, 17 images · IV contrast (iopamidol)
Comparison: None.

CLINICAL DATA: Left lower quadrant pain for several months.
Evaluate for hernia. History of a prior hernia repair as well as an
appendectomy. History of kidney stones.

EXAM:
CT ABDOMEN AND PELVIS WITH CONTRAST
TECHNIQUE: Multidetector CT imaging of the abdomen and pelvis was performed
using the standard protocol following bolus administration of
intravenous contrast.
CONTRAST:  100mL [TL] IOPAMIDOL ([TL]) INJECTION 61%

[Series 2: abd pelvis 5.00 br40 s3 axial · axial · 0.86mm/px · z∈[+1108,+1568]mm · 12 of 104 slices shown, 14 images]
[im 6/104  soft-tissue]
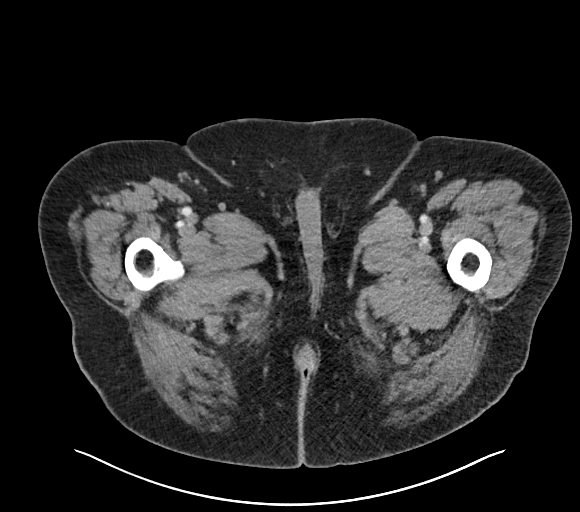
[im 6/104  bone]
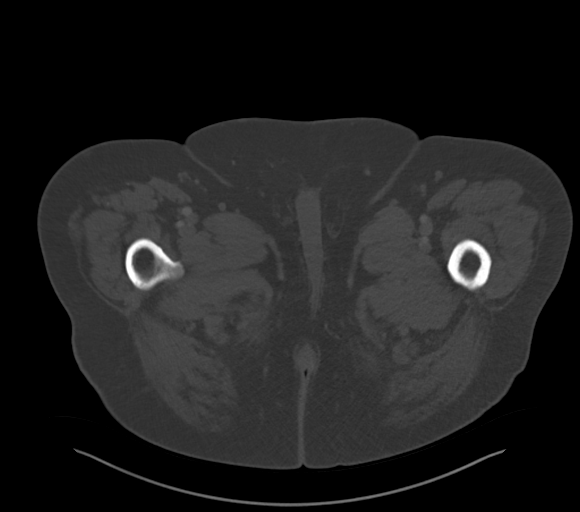
[im 16/104  soft-tissue]
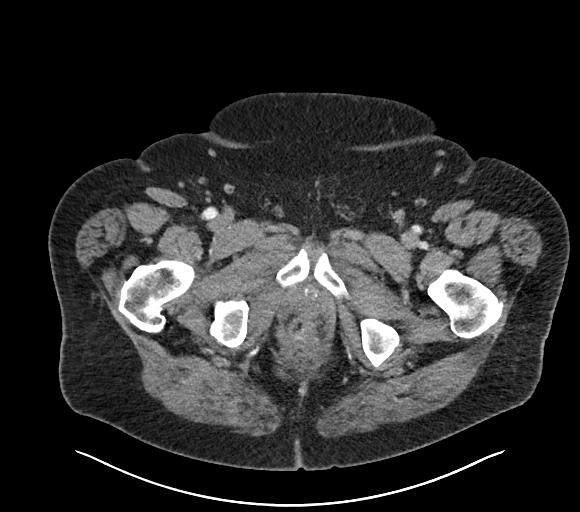
[im 21/104  soft-tissue]
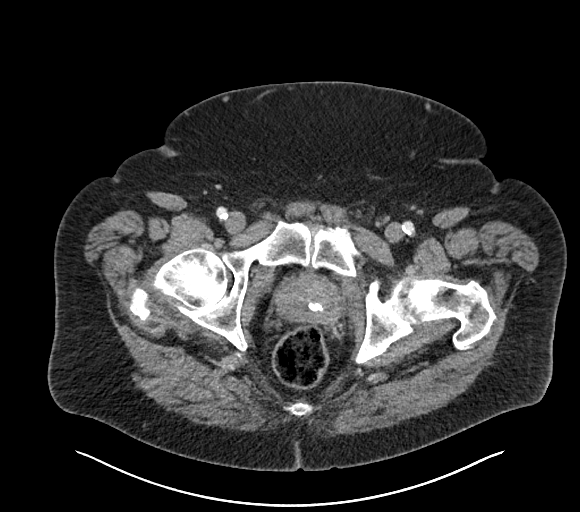
[im 31/104  soft-tissue]
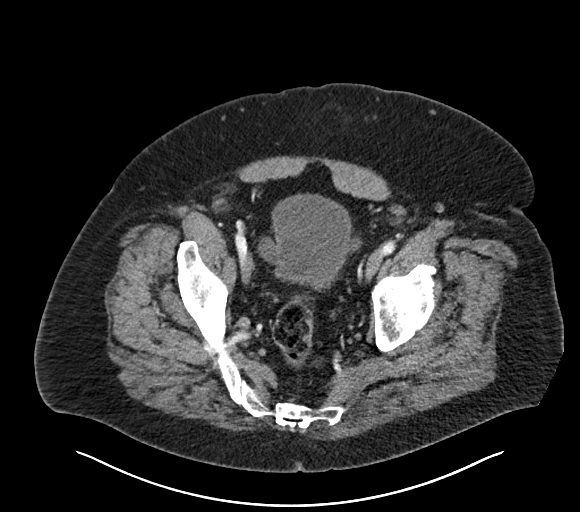
[im 42/104  soft-tissue]
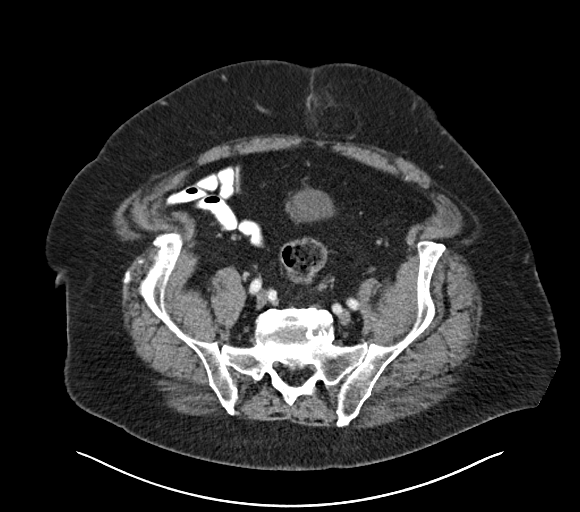
[im 47/104  soft-tissue]
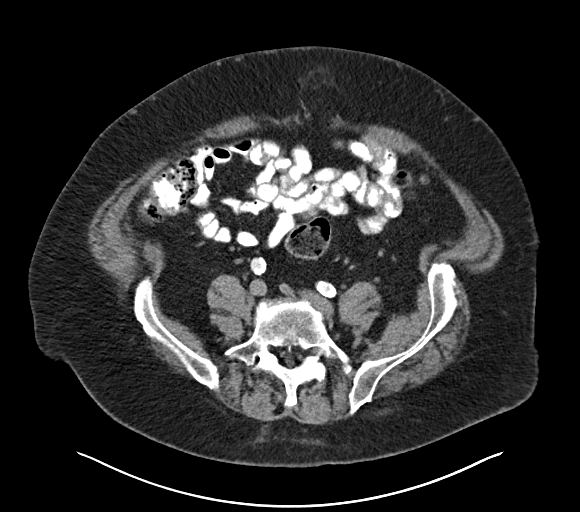
[im 57/104  soft-tissue]
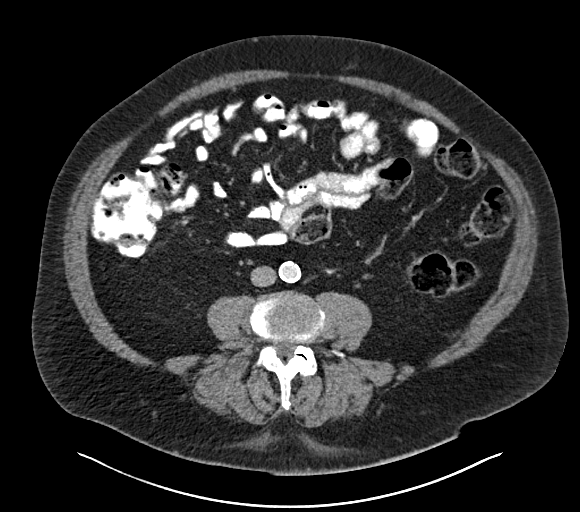
[im 62/104  soft-tissue]
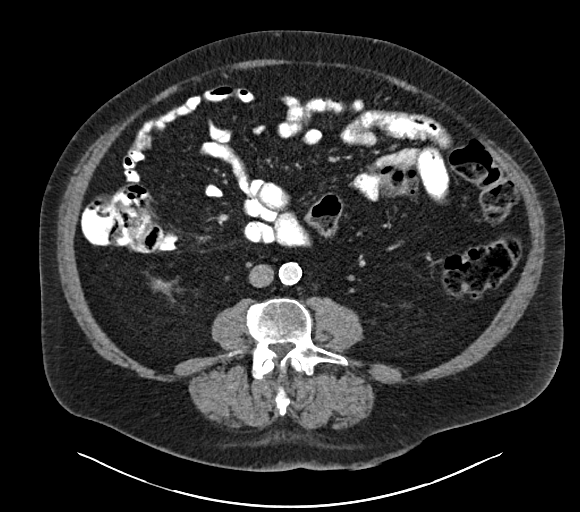
[im 73/104  soft-tissue]
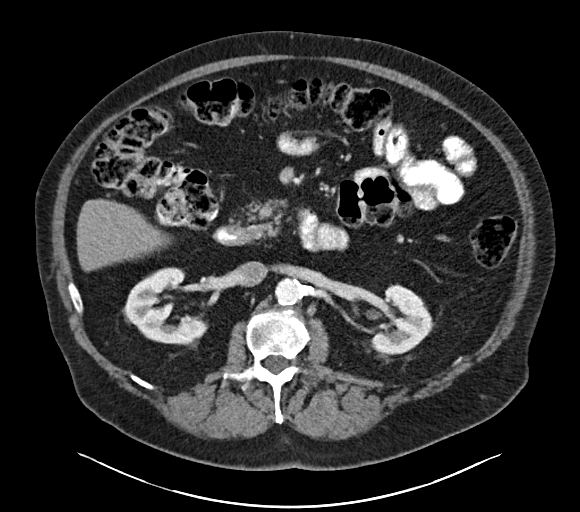
[im 73/104  bone]
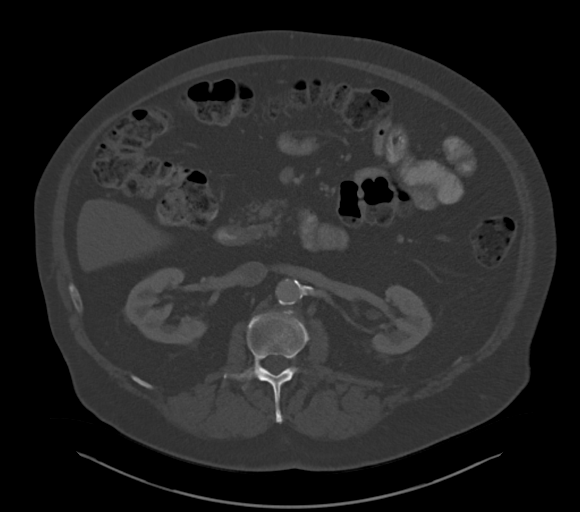
[im 83/104  soft-tissue]
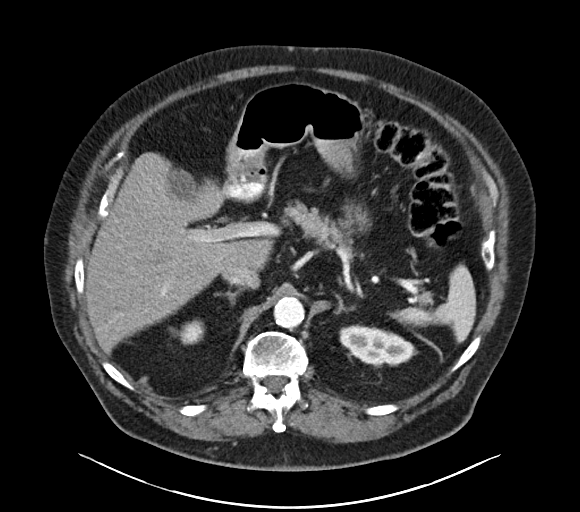
[im 88/104  soft-tissue]
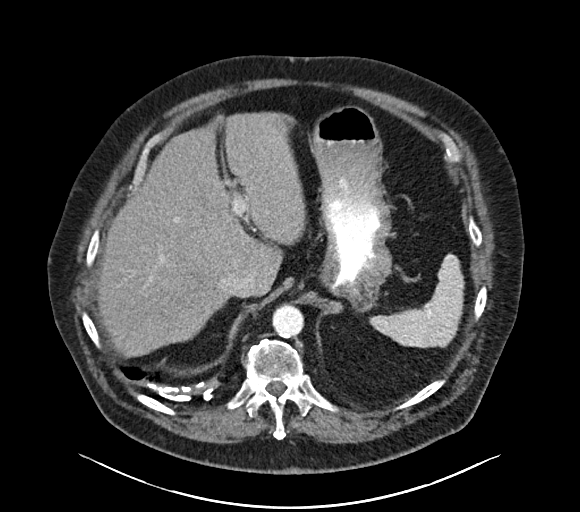
[im 98/104  soft-tissue]
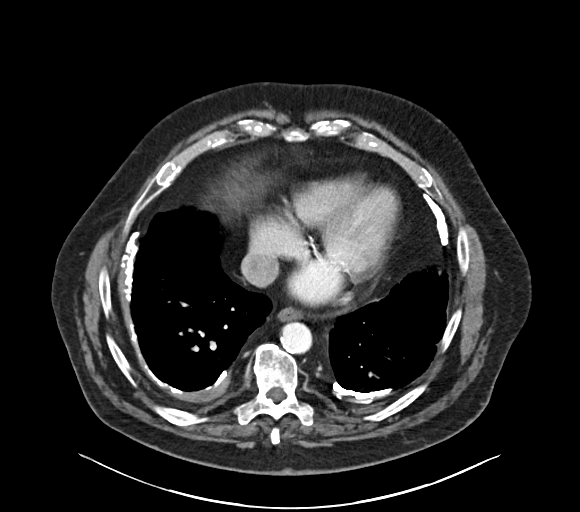

[Series 6: abd pelvis 2.00 br40 s3 cor · coronal · 0.94mm/px · 3 of 195 slices shown]
[im 65/195  soft-tissue]
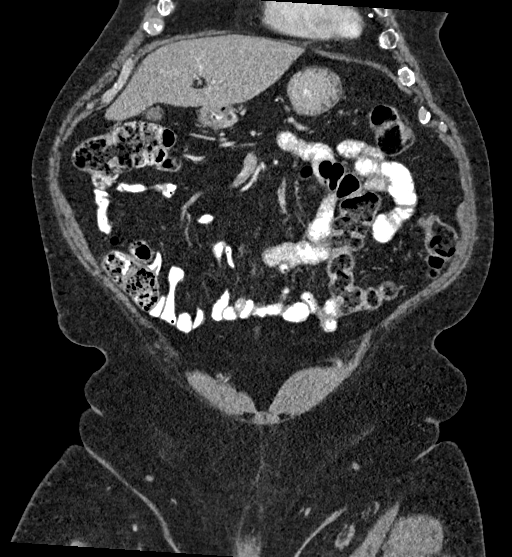
[im 87/195  soft-tissue]
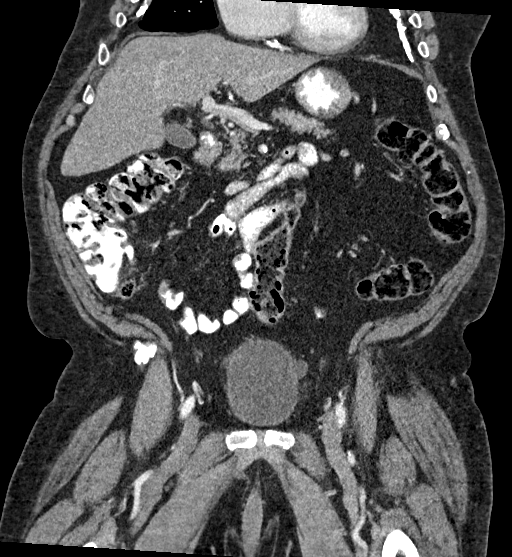
[im 108/195  soft-tissue]
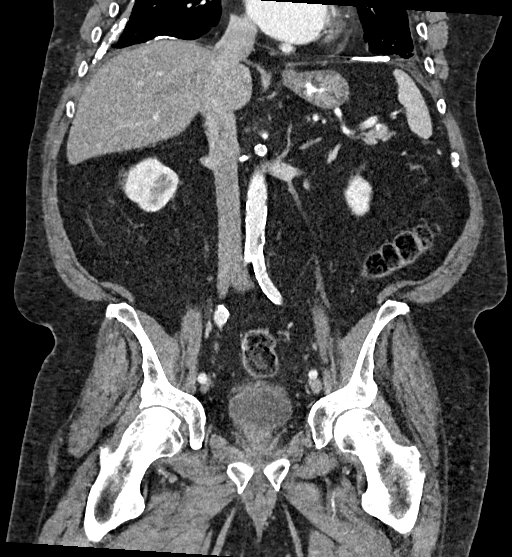

[15 of 46 positions shown; findings below may reference images not displayed]

FINDINGS: Lower chest: Calcified lung base pleural plaques. Associated
interstitial thickening/scarring. Findings consistent with
asbestosis. No acute findings.

Hepatobiliary: No focal liver abnormality is seen. No gallstones,
gallbladder wall thickening, or biliary dilatation.

Pancreas: Unremarkable. No pancreatic ductal dilatation or
surrounding inflammatory changes.

Spleen: Normal in size without focal abnormality.

Adrenals/Urinary Tract: No adrenal masses.

Mild bilateral renal cortical thinning. Kidneys are normal in
position and orientation. There is symmetric renal enhancement and
excretion. No renal masses, stones or hydronephrosis. Normal
ureters.

Bladder shows multiple small diverticula. No bladder masses, stones
or wall thickening.

Stomach/Bowel: Normal stomach. Small bowel and colon are normal in
caliber. No wall thickening. No inflammation.

Vascular/Lymphatic: Dense aortic atherosclerotic calcification.
There is significant atherosclerotic disease at the origin of the
aortic branch vessels. No aneurysm.

No enlarged lymph nodes.

Reproductive: Prostate normal in size.

Other: There are small midline fat containing hernias adjacent to
and inferior to the umbilicus. No bowel enters these. No other
hernias.

No ascites.

Musculoskeletal: No fracture or acute finding. No osteoblastic or
osteolytic lesions.
IMPRESSION: 1. No acute findings. No findings to account for the patient's left
lower quadrant pain.
2. No renal or ureteral stones or obstructive uropathy.
3. Small midline, fat containing hernias.
4. Dense aortic and branch vessel atherosclerotic calcifications.
5. Lung base changes consistent with asbestosis.
6. Small bladder diverticula.

## 2019-12-26 MED ORDER — IOPAMIDOL (ISOVUE-300) INJECTION 61%
100.0000 mL | Freq: Once | INTRAVENOUS | Status: AC | PRN
  Administered 2019-12-26: 14:00:00 100 mL via INTRAVENOUS

## 2019-12-30 NOTE — Addendum Note (Signed)
Addended by: Kyland No W on: 12/30/2019 04:23 PM   Modules accepted: Orders  

## 2019-12-31 ENCOUNTER — Telehealth: Payer: Self-pay | Admitting: *Deleted

## 2019-12-31 NOTE — Telephone Encounter (Signed)
PHARMACY: [facility] [address] [place], [place] [address]  Patient asked if all prescriptions can go to this pharmacy.

## 2019-12-31 NOTE — Telephone Encounter (Signed)
Spoke to pt told him received fax from [facility] regarding him taking Oxycodone 5 mg twice a day and Temazepam 15 mg one a day concerned about your safety. Asked pt if he could tolerate decreasing Temazepam to 7.5 mg a day? Pt said he would try. Told pt okay I will have Samantha send new Rx to [facility] tomororw when she comes in. Pt verbalized understanding.

## 2020-01-01 ENCOUNTER — Other Ambulatory Visit: Payer: Self-pay | Admitting: Physician Assistant

## 2020-01-01 MED ORDER — TEMAZEPAM 7.5 MG PO CAPS: 7.5000 mg | ORAL_CAPSULE | Freq: Every evening | ORAL | 1 refills | Status: DC | PRN

## 2020-01-01 NOTE — Telephone Encounter (Signed)
Pharmacy updated in pt's chart

## 2020-01-03 ENCOUNTER — Ambulatory Visit (INDEPENDENT_AMBULATORY_CARE_PROVIDER_SITE_OTHER): Payer: Medicare Other

## 2020-01-03 ENCOUNTER — Other Ambulatory Visit: Payer: Self-pay | Admitting: Physician Assistant

## 2020-01-03 DIAGNOSIS — I10 Essential (primary) hypertension: Secondary | ICD-10-CM

## 2020-01-03 DIAGNOSIS — I4811 Longstanding persistent atrial fibrillation: Secondary | ICD-10-CM

## 2020-01-03 DIAGNOSIS — R42 Dizziness and giddiness: Secondary | ICD-10-CM | POA: Diagnosis not present

## 2020-01-03 MED ORDER — TEMAZEPAM 15 MG PO CAPS: 15.0000 mg | ORAL_CAPSULE | Freq: Every evening | ORAL | 2 refills | Status: DC | PRN

## 2020-01-13 ENCOUNTER — Telehealth: Payer: Self-pay | Admitting: Physician Assistant

## 2020-01-13 ENCOUNTER — Other Ambulatory Visit: Payer: Self-pay | Admitting: Physician Assistant

## 2020-01-13 MED ORDER — TEMAZEPAM 15 MG PO CAPS: 15.0000 mg | ORAL_CAPSULE | Freq: Every evening | ORAL | 2 refills | Status: DC | PRN

## 2020-01-13 NOTE — Telephone Encounter (Signed)
MEDICATION:temazepam (RESTORIL) 15 MG capsule  PHARMACY:[id number] [place], [place] - [address]  Comments: patient's wife asked if any questions to call   **Let patient know to contact pharmacy at the end of the day to make sure medication is ready. **  ** Please notify patient to allow 48-72 hours to process**  **Encourage patient to contact the pharmacy for refills or they can request refills through [facility]**

## 2020-01-13 NOTE — Telephone Encounter (Signed)
Please see request

## 2020-01-13 NOTE — Telephone Encounter (Signed)
sent 

## 2020-01-13 NOTE — Telephone Encounter (Signed)
Left message on voicemail Rx sent to pharmacy as requested. 

## 2020-01-28 ENCOUNTER — Other Ambulatory Visit: Payer: Self-pay

## 2020-01-28 ENCOUNTER — Encounter: Payer: Self-pay | Admitting: Cardiovascular Disease

## 2020-01-28 ENCOUNTER — Ambulatory Visit: Payer: Medicare Other | Admitting: Cardiovascular Disease

## 2020-01-28 DIAGNOSIS — I4811 Longstanding persistent atrial fibrillation: Secondary | ICD-10-CM

## 2020-01-28 DIAGNOSIS — J432 Centrilobular emphysema: Secondary | ICD-10-CM | POA: Diagnosis not present

## 2020-01-28 DIAGNOSIS — I10 Essential (primary) hypertension: Secondary | ICD-10-CM

## 2020-01-28 DIAGNOSIS — R600 Localized edema: Secondary | ICD-10-CM | POA: Diagnosis not present

## 2020-01-28 NOTE — Assessment & Plan Note (Signed)
History of chronic [name] rate controlled on Eliquis oral anticoagulation 

## 2020-01-28 NOTE — Progress Notes (Signed)
01/28/2020 Maurice Reed   09/06/1941  [id number]  Primary Physician Inda Coke, [place] Primary Cardiologist: Lorretta Harp MD Lupe Carney, [name]  HPI:  Maurice Reed is a 78 y.o.  moderately overweight married Caucasian male (wife is [name]), father of 61, grandfather 52 grandchildren who is retired from working in the [employer]. He was referred by Inda Coke, PA-C to be established in our practice because of prior cardiac history.  I last saw him in the office 09/18/2019. He has a greater than 100-pack-year history tobacco abuse having quit in 1985 and smoked 3 packs a day at that time. He does have treated hypertension, obstructive sleep apnea on BiPAP. He does wear oxygen nightly. He has chronic [name] and a history of remote PE on Eliquis oral anticoagulation. He is never had a heart attack or stroke. He has chronic shortness of breath but denies chest pain.  Since I saw him in the office October 2020 he continues to do well.  He was seen in the emergency room with volume overload recently and was diuresed.  His oral diuretics were increased for a week and then return back to baseline dosing.  He saw Angie Duke in the office 12/19/2019.  An event monitor was ordered because of question of bradycardia.  This is still pending.  He does not feel his breathing has any worse than his baseline.  He denies chest pain.  He is in chronic [name] on Eliquis oral anticoagulation rate controlled..   Current Meds  Medication Sig  . Ascorbic Acid (VITAMIN C) 500 MG CAPS Take 1 capsule by mouth daily.  . Atropine Sulfate-NaCl 0.01-0.9 % SOLN Apply 1 drop to eye at bedtime.   . Cholecalciferol (VITAMIN D3) 3000 units TABS Take by mouth.  . clobetasol (TEMOVATE) 0.05 % external solution APPLY TO AFFECTED AREAS ON SCALP ONE TO TWO TIMES A DAY AS NEEDED  . desonide (DESOWEN) 0.05 % lotion Apply topically.  . docusate sodium (COLACE) 100 MG capsule Take 100 mg by mouth daily.   Marland Kitchen  doxycycline (VIBRA-TABS) 100 MG tablet TAKE 1 TABLET BY MOUTH  DAILY  . ELIQUIS 5 MG TABS tablet TAKE 1 TABLET BY MOUTH  TWICE DAILY  . erythromycin ophthalmic ointment APP THIN LAYER IN OS BID  . MAGNESIUM-OXIDE 400 (241.3 Mg) MG tablet TAKE 1 TABLET BY MOUTH TWICE DAILY  . metoprolol succinate (TOPROL-XL) 25 MG 24 hr tablet TAKE 1 TABLET BY MOUTH  DAILY  . mometasone (NASONEX) 50 MCG/ACT nasal spray Place 2 sprays into the nose daily. (Patient taking differently: Place 2 sprays into the nose as needed. )  . NARCAN 4 MG/0.1ML LIQD nasal spray kit INSTILL ONE SPRAY PRN FOR ACCIDENTAL OVERDOSE  . oxyCODONE (OXY IR/ROXICODONE) 5 MG immediate release tablet Take 5 mg by mouth 2 (two) times daily as needed. for pain  . potassium chloride (MICRO-K) 10 MEQ CR capsule TAKE 1 CAPSULE BY MOUTH  TWICE DAILY  . prednisoLONE acetate (PRED FORTE) 1 % ophthalmic suspension Place 1 drop into the left eye 2 (two) times daily.   . tadalafil (CIALIS) 20 MG tablet Take 0.5-1 tablets (10-20 mg total) by mouth every other day as needed for erectile dysfunction.  . Tafluprost (ZIOPTAN) 0.0015 % SOLN Apply 1 drop to eye at bedtime. Right eye  . Tafluprost, PF, 0.0015 % SOLN Apply 1 drop to eye daily. Drop 1 drop into right at night  . tamsulosin (FLOMAX) 0.4 MG CAPS  capsule TAKE 1 CAPSULE BY MOUTH AT  BEDTIME  . temazepam (RESTORIL) 15 MG capsule Take 1 capsule (15 mg total) by mouth at bedtime as needed for sleep.  Marland Kitchen torsemide (DEMADEX) 20 MG tablet TAKE 1 TABLET BY MOUTH TWO  TIMES DAILY  . triamcinolone cream (KENALOG) 0.1 % APPLY AA BID     Allergies  Allergen Reactions  . Other Itching    Cats: watery eyes, itching, sneezing    Social History   Socioeconomic History  . Marital status: Married    Spouse name: Not on file  . Number of children: Not on file  . Years of education: Not on file  . Highest education level: Not on file  Occupational History  . Not on file  Tobacco Use  . Smoking status:  Former Smoker    Packs/day: 3.00    Years: 25.00    Pack years: 75.00    Types: Cigarettes    Quit date: 11/28/1984    Years since quitting: 35.1  . Smokeless tobacco: Never Used  Substance and Sexual Activity  . Alcohol use: Never  . Drug use: Never  . Sexual activity: Yes  Other Topics Concern  . Not on file  Social History Narrative   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30)   Married to [employer] ([place] patient)   7 children   Currently lives with son in [place], [place]; all their things are in storage; plan to travel and stay with kids but keep their home base in the [place] area   Social Determinants of Health   Financial Resource Strain:   . Difficulty of Paying Living Expenses: Not on file  Food Insecurity:   . Worried About Charity fundraiser in the Last Year: Not on file  . Ran Out of Food in the Last Year: Not on file  Transportation Needs:   . Lack of Transportation (Medical): Not on file  . Lack of Transportation (Non-Medical): Not on file  Physical Activity:   . Days of Exercise per Week: Not on file  . Minutes of Exercise per Session: Not on file  Stress:   . Feeling of Stress : Not on file  Social Connections:   . Frequency of Communication with Friends and Family: Not on file  . Frequency of Social Gatherings with Friends and Family: Not on file  . Attends Religious Services: Not on file  . Active Member of Clubs or Organizations: Not on file  . Attends Archivist Meetings: Not on file  . Marital Status: Not on file  Intimate Partner Violence:   . Fear of Current or Ex-Partner: Not on file  . Emotionally Abused: Not on file  . Physically Abused: Not on file  . Sexually Abused: Not on file     Review of Systems: General: negative for chills, fever, night sweats or weight changes.  Cardiovascular: negative for chest pain, dyspnea on exertion, edema, orthopnea, palpitations, paroxysmal nocturnal  dyspnea or shortness of breath Dermatological: negative for rash Respiratory: negative for cough or wheezing Urologic: negative for hematuria Abdominal: negative for nausea, vomiting, diarrhea, bright red blood per rectum, melena, or hematemesis Neurologic: negative for visual changes, syncope, or dizziness All other systems reviewed and are otherwise negative except as noted above.    Blood pressure 132/70, pulse 96, temperature 98.1 F (36.7 C), height '5\' 7"'  (1.702 m), weight 275 lb (124.7 kg).  General appearance: alert and no distress  Neck: no adenopathy, no carotid bruit, no JVD, supple, symmetrical, trachea midline and thyroid not enlarged, symmetric, no tenderness/mass/nodules Lungs: clear to auscultation bilaterally Heart: irregularly irregular rhythm Extremities: 1+ bilat LE edema Pulses: 2+ and symmetric Skin: Skin color, texture, turgor normal. No rashes or lesions Neurologic: Alert and oriented X 3, normal strength and tone. Normal symmetric reflexes. Normal coordination and gait  EKG Not performed today  ASSESSMENT AND PLAN:   Essential hypertension History of essential hypertension with blood pressure measured today 132/70.  He is on metoprolol.  Atrial fibrillation (HCC) History of chronic [name] rate controlled on Eliquis oral anticoagulation.  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  Bilateral lower extremity edema History of chronic bilateral lower extremity edema secondary to diastolic dysfunction on torsemide 20 mg p.o. twice daily.  He was recently seen in the ER with volume overload and was diuresed.  His torsemide was increased to 40 mg p.o. twice daily for a week and then back down to his original dose.  He is aware of salt restriction.  He weighs himself on a daily basis.      Lorretta Harp MD FACP,FACC,FAHA, [facility] 01/28/2020 2:09 PM

## 2020-01-28 NOTE — Patient Instructions (Signed)
Medication Instructions:  Your physician recommends that you continue on your current medications as directed. Please refer to the Current Medication list given to you today.  If you need a refill on your cardiac medications before your next appointment, please call your pharmacy.   Lab work: NONE  Testing/Procedures: NONE  Follow-Up: At [facility], you and your health needs are our priority.  As part of our continuing mission to provide you with exceptional heart care, we have created designated Provider Care Teams.  These Care Teams include your primary Cardiologist (physician) and Advanced Practice Providers (APPs -  Physician Assistants and Nurse Practitioners) who all work together to provide you with the care you need, when you need it. You may see [name], MD or one of the following Advanced Practice Providers on your designated Care Team:    Corine Shelter, PA-C  [place], [place]  Edd Fabian, [place]  Your physician wants you to follow-up in: 6 months with Micah Flesher PA-C and Dr. Allyson Sabal in 1 year. You will receive a reminder letter in the mail two months in advance. If you don't receive a letter, please call our office to schedule the follow-up appointment.

## 2020-01-28 NOTE — Assessment & Plan Note (Signed)
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

## 2020-01-28 NOTE — Assessment & Plan Note (Signed)
History of essential hypertension with blood pressure measured today 132/70.  He is on metoprolol.

## 2020-01-28 NOTE — Assessment & Plan Note (Signed)
History of chronic bilateral lower extremity edema secondary to diastolic dysfunction on torsemide 20 mg p.o. twice daily.  He was recently seen in the ER with volume overload and was diuresed.  His torsemide was increased to 40 mg p.o. twice daily for a week and then back down to his original dose.  He is aware of salt restriction.  He weighs himself on a daily basis.

## 2020-01-29 ENCOUNTER — Other Ambulatory Visit: Payer: Self-pay | Admitting: Physician Assistant

## 2020-01-29 DIAGNOSIS — I4811 Longstanding persistent atrial fibrillation: Secondary | ICD-10-CM

## 2020-01-29 DIAGNOSIS — I10 Essential (primary) hypertension: Secondary | ICD-10-CM

## 2020-01-29 DIAGNOSIS — R42 Dizziness and giddiness: Secondary | ICD-10-CM

## 2020-02-02 NOTE — Progress Notes (Signed)
'@Patient'  ID: Maurice Reed, male    DOB: 06-Sep-1941, 79 y.o.   MRN: [id number]  No chief complaint on file.   Referring provider: Inda Coke, PA  HPI:  79 year old male former smoker initially referred to our office on 05/29/2018 to be established with a local pulmonologist for management of COPD as well as obstructive sleep apnea. Patient is currently on BiPAP. Chronic respiratory failure. Also history of asbestosis  Past medical history: History of DVT and PE in 2002, 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 50-pack-year smoking history. Maintenance: None Patient of Dr. [name]  02/03/2020  - Visit   79 year old male former smoker followed in our office for COPD as well as chronic respiratory failure and obstructive sleep apnea.  Patient is managed on BiPAP.  Patient has a history of asbestosis.  Patient completing a 6-week follow-up with our office today.  He has since seen cardiology.  He had a heart monitor for 2 weeks.  That showed chronic [name].  Average rate 77.  Range 45-140s.  Patient reports that he symptomatically is feeling better.  He has not noticed any increases to his oxygen needs.  Continues to be maintained on 3 L pulsed, 4 to 5 L pulsed with physical exertion.  When at home 2 L continuous at rest, 3 L continuous with exertion.  Patient reports adherence to his BiPAP.  BiPAP compliance report shows excellent compliance.  See compliance report listed below:  01/01/2020-01/30/2020- 30 had a last 30 days use, all 30 those days greater than 4 hours, average usage 10 hours and 15 minutes,IPAP 14, EPAP 8, AHI 0.3  No acute respiratory complaints today.  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 Features of asbestosis areNOTidentified.   01/2018 titration >>bipap 18/12   11/2017 spiro ratio 86, FEV 1 45%, FVC 39 % , severe restriction ABG 12/2017 7.42/45/82/ 97% RA  PFT 02/2019 no airway obstruction, ratio 86, FEV1 60%, F VC 50%, no bronchodilator response, TLC 48%, DLCO 66% , corrects for alveolar volume-moderate intraparenchymal restriction  Echo 07/2018 RVSP 57  09/17/2019-CT chest high-res-calcified pleural plaques bilaterally, compatible with asbestosis related pleural disease, there are findings compatible with interstitial lung disease with spectrum of findings indeterminate for UIP, dilatation of pulmonary trunk concerning for associated pulmonary arterial hypertension  03/15/2019-echocardiogram-LV ejection fraction 60 to 65%, right ventricle has normal systolic function, left atrial size mildly dilated, mild thickening of the mitral valve leaflet, mild calcification of mitral valve leaflet, aortic valve has indeterminate number of cusp, severe thickening of the aortic valve, sclerosis without any evidence of stenosis in the aortic valve   FENO:  No results found for: NITRICOXIDE  PFT: PFT Results Latest Ref Rng & Units 02/14/2019  FVC-Pre L 2.05  FVC-Predicted Pre % 50  FVC-Post L 1.95  FVC-Predicted Post % 47  Pre FEV1/FVC % % 86  Post FEV1/FCV % % 86  FEV1-Pre L 1.76  FEV1-Predicted Pre % 60  FEV1-Post L 1.69  DLCO UNC% % 66  DLCO COR %Predicted % 146  TLC L 3.37  TLC % Predicted % 48  RV % Predicted % 56    WALK:  SIX MIN WALK 12/23/2019 08/16/2019 01/24/2019 05/29/2018  Supplimental Oxygen during Test? (L/min) Yes No No No  O2 Flow Rate 2 - - -  Type Continuous - - -  Tech Comments: Patient was able to complete 1 lap at a slow pace. Patient had to walk and hold onto the wall during walk for his balance. Halfway into lap he stated that he felt lightheaded but wanted to continue the walk. He was walked on 2L of O2. Did have some SOB but no chest or  leg pain. Lightheadedness went away after patient was able to sit for a few minutes. Walked at moderate pace tolerated with no c/o of dizziness or shorness of breath. Discontinued walk due to elevated heart rate. moderate SOB upon return to room fast paced walk no SOb noted.    Imaging: LONG TERM MONITOR (3-14 DAYS)  Result Date: 02/01/2020 1. Afib with variable vent response (slow and fast) 2. Occasional aberrantly conducted beats    Lab Results:  CBC    Component Value Date/Time   WBC 6.1 12/17/2019 1630   RBC 4.24 12/17/2019 1630   HGB 14.7 12/17/2019 1630   HCT 44.9 12/17/2019 1630   PLT 208 12/17/2019 1630   MCV 105.9 (H) 12/17/2019 1630   MCH 34.7 (H) 12/17/2019 1630   MCHC 32.7 12/17/2019 1630   RDW 12.5 12/17/2019 1630   LYMPHSABS 1.5 12/17/2019 1630   MONOABS 0.5 12/17/2019 1630   EOSABS 0.1 12/17/2019 1630   BASOSABS 0.0 12/17/2019 1630    BMET    Component Value Date/Time   NA 141 12/19/2019 1634   K 3.8 12/19/2019 1634   CL 94 (L) 12/19/2019 1634   CO2 32 (H) 12/19/2019 1634   GLUCOSE 154 (H) 12/19/2019 1634   GLUCOSE 105 (H) 12/17/2019 1630   BUN 14 12/19/2019 1634   CREATININE 1.11 12/19/2019 1634   CREATININE 0.90 12/13/2019 1512   CALCIUM 9.7 12/19/2019 1634   GFRNONAA 63 12/19/2019 1634   GFRAA 73 12/19/2019 1634    BNP    Component Value Date/Time   BNP 39.5 12/19/2019 1634   BNP 65.0 12/17/2019 1630   BNP 67 12/13/2019 1512    ProBNP No results found for: PROBNP  Specialty Problems      Pulmonary Problems   OSA treated with BiPAP    01/23/2018 titration >> bipap 18/12  >>> controlled on 14/8 on download       Pulmonary emphysema [facility])    Former smoker 1958-85. Worked in [employer] all his life. CT on Jan 2019: 1. 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 Features of asbestosis are not identified.   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 Other Itching    Cats: watery eyes, itching, sneezing    Immunization History  Administered Date(s) Administered  . Fluad Quad(high Dose 65+) 08/23/2019  . Influenza, High Dose Seasonal PF 09/21/2018  . Influenza, Seasonal, Injecte, Preservative Fre 09/28/2017  . Pneumococcal Polysaccharide-23 12/14/2015    Past Medical History:  Diagnosis Date  .  Arthritis   . Atrial fibrillation ([place])   . 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 GERD (gastroesophageal reflux disease)   . Kidney stones   . Obesity     Tobacco History: Social History   Tobacco Use  Smoking Status Former Smoker  . Packs/day: 3.00  . Years: 25.00  . Pack years: 75.00  . Types: Cigarettes  . Quit date: 11/28/1984  . Years since quitting: 35.2  Smokeless Tobacco Never Used   Counseling given: Not Answered   Continue to not smoke  Outpatient Encounter Medications as of 02/03/2020  Medication Sig  . Ascorbic Acid (VITAMIN C) 500 MG CAPS Take 1 capsule by mouth daily.  . Atropine Sulfate-NaCl 0.01-0.9 % SOLN Apply 1 drop to eye at bedtime.   . Cholecalciferol (VITAMIN D3) 3000 units TABS Take by mouth.  . clobetasol (TEMOVATE) 0.05 % external solution APPLY TO AFFECTED AREAS ON SCALP ONE TO TWO TIMES A DAY AS NEEDED  . desonide (DESOWEN) 0.05 % lotion Apply topically.  . docusate sodium (COLACE) 100 MG capsule Take 100 mg by mouth  daily.   Marland Kitchen doxycycline (VIBRA-TABS) 100 MG tablet TAKE 1 TABLET BY MOUTH  DAILY  . ELIQUIS 5 MG TABS tablet TAKE 1 TABLET BY MOUTH  TWICE DAILY  . erythromycin ophthalmic ointment APP THIN LAYER IN OS BID  . MAGNESIUM-OXIDE 400 (241.3 Mg) MG tablet TAKE 1 TABLET BY MOUTH TWICE DAILY  . metoprolol succinate (TOPROL-XL) 25 MG 24 hr tablet TAKE 1 TABLET BY MOUTH  DAILY  . mometasone (NASONEX) 50 MCG/ACT nasal spray Place 2 sprays into the nose daily. (Patient taking differently: Place 2 sprays into the nose as needed. )  . NARCAN 4 MG/0.1ML LIQD nasal spray kit INSTILL ONE SPRAY PRN FOR ACCIDENTAL OVERDOSE  . oxyCODONE (OXY IR/ROXICODONE) 5 MG immediate release tablet Take 5 mg by mouth 2 (two) times daily as needed. for pain  . potassium chloride (MICRO-K) 10 MEQ CR capsule TAKE 1 CAPSULE BY MOUTH  TWICE DAILY  . prednisoLONE acetate (PRED FORTE) 1 % ophthalmic suspension Place 1 drop into the left eye 2 (two) times daily.   . tadalafil (CIALIS) 20 MG tablet Take 0.5-1 tablets (10-20 mg total) by mouth every other day as needed for erectile dysfunction.  . Tafluprost (ZIOPTAN) 0.0015 % SOLN Apply 1 drop to eye at bedtime. Right eye  . Tafluprost, PF, 0.0015 % SOLN Apply 1 drop to eye daily. Drop 1 drop into right at night  . tamsulosin (FLOMAX) 0.4 MG CAPS capsule TAKE 1 CAPSULE BY MOUTH AT  BEDTIME  . temazepam (RESTORIL) 15 MG capsule Take 1 capsule (15 mg total) by mouth at bedtime as needed for sleep.  Marland Kitchen triamcinolone cream (KENALOG) 0.1 % APPLY AA BID  . [DISCONTINUED] torsemide (DEMADEX) 20 MG tablet TAKE 1 TABLET BY MOUTH TWO  TIMES DAILY   No facility-administered encounter medications on file as of 02/03/2020.     Review of Systems  Review of Systems  Constitutional: Positive for fatigue. Negative for activity change, chills, fever and unexpected weight change.  HENT: Negative for postnasal drip, rhinorrhea, sinus pressure, sinus pain and sore throat.   Eyes: Negative.     Respiratory: Positive for shortness of breath. Negative for cough and wheezing.   Cardiovascular: Positive for leg swelling. Negative for chest pain and palpitations.  Gastrointestinal: Negative for constipation, diarrhea, nausea and vomiting.  Endocrine: Negative.   Genitourinary: Negative.   Musculoskeletal: Negative.  Skin: Negative.   Neurological: Negative for dizziness and headaches.  Psychiatric/Behavioral: Negative.  Negative for dysphoric mood. The patient is not nervous/anxious.   All other systems reviewed and are negative.    Physical Exam  BP 124/70 (BP Location: Left Arm, Patient Position: Sitting, Cuff Size: Large)   Pulse 88   Temp 98 F (36.7 C) (Temporal)   Ht '5\' 7"'  (1.702 m)   Wt 275 lb 9.6 oz (125 kg)   SpO2 100% Comment: on 3L  BMI 43.17 kg/m   Wt Readings from Last 5 Encounters:  02/03/20 275 lb 9.6 oz (125 kg)  01/28/20 275 lb (124.7 kg)  12/23/19 277 lb 6.4 oz (125.8 kg)  12/19/19 275 lb 6.4 oz (124.9 kg)  12/17/19 258 lb (117 kg)    BMI Readings from Last 5 Encounters:  02/03/20 43.17 kg/m  01/28/20 43.07 kg/m  12/23/19 38.69 kg/m  12/19/19 38.41 kg/m  12/17/19 37.55 kg/m     Physical Exam Vitals and nursing note reviewed.  Constitutional:      General: He is not in acute distress.    Appearance: Normal appearance. He is obese.  HENT:     Head: Normocephalic and atraumatic.     Right Ear: Hearing, tympanic membrane, ear canal and external ear normal.     Left Ear: Hearing, tympanic membrane, ear canal and external ear normal.     Nose: Nose normal. No mucosal edema, congestion or rhinorrhea.     Right Turbinates: Not enlarged.     Left Turbinates: Not enlarged.     Mouth/Throat:     Mouth: Mucous membranes are dry.     Pharynx: Oropharynx is clear. No oropharyngeal exudate.  Eyes:     Pupils: Pupils are equal, round, and reactive to light.  Cardiovascular:     Rate and Rhythm: Normal rate. Rhythm regularly irregular.      Pulses: Normal pulses.     Heart sounds: Normal heart sounds. No murmur.  Pulmonary:     Effort: Pulmonary effort is normal.     Breath sounds: Normal breath sounds. No decreased breath sounds, wheezing or rales.  Abdominal:     General: Abdomen is flat. There is distension.     Palpations: Abdomen is soft.     Comments: Obesity   Musculoskeletal:     Cervical back: Normal range of motion.     Right lower leg: Edema present.     Left lower leg: Edema present.     Comments: Compression stockings applied, 2-3+ - baseline   Lymphadenopathy:     Cervical: No cervical adenopathy.  Skin:    General: Skin is warm and dry.     Capillary Refill: Capillary refill takes less than 2 seconds.     Findings: No erythema or rash.  Neurological:     General: No focal deficit present.     Mental Status: He is alert and oriented to person, place, and time.     Motor: No weakness.     Coordination: Coordination normal.     Gait: Gait is intact. Gait normal.  Psychiatric:        Mood and Affect: Mood normal.        Behavior: Behavior normal. Behavior is cooperative.        Thought Content: Thought content normal.        Judgment: Judgment normal.       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 [name].   Lauraine Rinne, NP 02/03/2020   This appointment required 25 minutes of patient care (this includes precharting, chart review, review of results,  face-to-face care, etc.).

## 2020-02-03 ENCOUNTER — Other Ambulatory Visit: Payer: Self-pay | Admitting: *Deleted

## 2020-02-03 ENCOUNTER — Other Ambulatory Visit: Payer: Self-pay

## 2020-02-03 ENCOUNTER — Ambulatory Visit: Payer: Medicare Other | Admitting: Pulmonary Disease

## 2020-02-03 ENCOUNTER — Encounter: Payer: Self-pay | Admitting: Pulmonary Disease

## 2020-02-03 DIAGNOSIS — I4811 Longstanding persistent atrial fibrillation: Secondary | ICD-10-CM | POA: Diagnosis not present

## 2020-02-03 DIAGNOSIS — G4733 Obstructive sleep apnea (adult) (pediatric): Secondary | ICD-10-CM | POA: Diagnosis not present

## 2020-02-03 DIAGNOSIS — I2729 Other secondary pulmonary hypertension: Secondary | ICD-10-CM

## 2020-02-03 DIAGNOSIS — J9611 Chronic respiratory failure with hypoxia: Secondary | ICD-10-CM | POA: Diagnosis not present

## 2020-02-03 MED ORDER — TORSEMIDE 20 MG PO TABS: 20.0000 mg | ORAL_TABLET | Freq: Two times a day (BID) | ORAL | 1 refills | Status: DC

## 2020-02-03 NOTE — Telephone Encounter (Signed)
Patient's wife called in and said the Rx was called in for 15 MG instead of 7.5 MG, and wanted to know if another could be called in again.

## 2020-02-03 NOTE — Assessment & Plan Note (Signed)
Plan: Continue Eliquis Continue follow-up with cardiology 

## 2020-02-03 NOTE — Telephone Encounter (Signed)
Left message on voicemail to call office.  

## 2020-02-03 NOTE — Assessment & Plan Note (Signed)
Likely WHO class II or III  Plan: Continue to monitor clinically Continue oxygen therapy as prescribed Keep follow-up with cardiology

## 2020-02-03 NOTE — Patient Instructions (Addendum)
You were seen today by Coral Ceo, NP  for:   1. Chronic respiratory failure with hypoxia (HCC) 2. Centrilobular emphysema (HCC) 3. 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 Longstanding persistent atrial fibrillation (HCC)  Continue to follow-up with cardiology Keep close follow-up with our office  5. OSA treated with BiPAP  Continue BiPAP at this time  We recommend that you continue using your biPAP daily >>>Keep up the hard work using your device >>> Goal should be wearing this for the entire night that you are sleeping, at least 4 to 6 hours  Remember:  . Do not drive or operate heavy machinery if tired or drowsy.  . Please notify the supply company and office if you are unable to use your device regularly due to missing supplies or machine being broken.  . Work on maintaining a healthy weight and following your recommended nutrition plan  . Maintain proper daily exercise and movement  . Maintaining proper use of your device can also help improve management of other chronic illnesses such as: Blood pressure, blood sugars, and weight management.   BiPAP/ CPAP Cleaning:  >>>Clean weekly, with Dawn soap, and bottle brush.  Set up to air dry. >>> Wipe mask out daily with wet wipe or towelette   Follow Up:    Return in about 3 months (around 05/05/2020), or if symptoms worsen or fail to improve, for Follow up with Dr. Vassie Loll.   Please do your part to reduce the spread of COVID-19:      Reduce your risk of any infection  and COVID19 by using the similar precautions used for avoiding the common cold or flu:  Marland Kitchen Wash your hands often with soap and warm water for at least 20 seconds.  If  soap and water are not readily available, use an alcohol-based hand sanitizer with at least 60% alcohol.  . If coughing or sneezing, cover your mouth and nose by coughing or sneezing into the elbow areas of your shirt or coat, into a tissue or into your sleeve (not your hands). Drinda Butts A MASK when in public  . Avoid shaking hands with others and consider head nods or verbal greetings only. . Avoid touching your eyes, nose, or mouth with unwashed hands.  . Avoid close contact with people who are sick. . Avoid places or events with large numbers of people in one location, like concerts or sporting events. . If you have some symptoms but not all symptoms, continue to monitor at home and seek medical attention if your symptoms worsen. . If you are having a medical emergency, call 911.   ADDITIONAL HEALTHCARE OPTIONS FOR PATIENTS  [facility] / e-Visit: [email or web address]         [facility]: [phone]  [facility]: [phone]                   [facility]: [phone]     It is flu season:   >>> Best ways to protect herself from the flu: Receive the yearly flu vaccine, practice good hand hygiene washing with soap and also using hand sanitizer when available, eat a nutritious meals, get adequate rest,  hydrate appropriately   Please contact the office if your symptoms worsen or you have concerns that you are not improving.   Thank you for choosing [facility] for your healthcare, and for allowing [place] to partner with you on your healthcare journey. I am thankful to be able to provide care to you today.   Wyn Quaker FNP-C

## 2020-02-03 NOTE — Assessment & Plan Note (Signed)
Plan: Continue oxygen therapy as prescribed Maintain oxygen saturations above 88 to 90%

## 2020-02-03 NOTE — Telephone Encounter (Signed)
Asked to put in to [facility] [place], [place] - [address] [place]

## 2020-02-03 NOTE — Assessment & Plan Note (Signed)
Plan: Continue BiPAP 

## 2020-02-04 NOTE — Telephone Encounter (Signed)
Spoke to pt's wife [name], told her when I spoke to pt last I told him we were keeping the Rx the same due to insurance would not cover the medication at a lower dose. Told her Rx for Temazepam 15 mg was sent to  [facility]. [name] verbalized understanding.Marland Kitchen

## 2020-02-20 ENCOUNTER — Encounter: Payer: Medicare Other | Admitting: Physician Assistant

## 2020-02-21 ENCOUNTER — Other Ambulatory Visit: Payer: Self-pay

## 2020-02-21 ENCOUNTER — Encounter: Payer: Self-pay | Admitting: Gastroenterology

## 2020-02-21 ENCOUNTER — Ambulatory Visit (INDEPENDENT_AMBULATORY_CARE_PROVIDER_SITE_OTHER): Payer: Medicare Other | Admitting: Physician Assistant

## 2020-02-21 ENCOUNTER — Encounter: Payer: Self-pay | Admitting: Physician Assistant

## 2020-02-21 VITALS — BP 130/80 | HR 98 | Temp 98.4°F | Ht 67.0 in | Wt 285.4 lb

## 2020-02-21 DIAGNOSIS — E669 Obesity, unspecified: Secondary | ICD-10-CM

## 2020-02-21 DIAGNOSIS — I10 Essential (primary) hypertension: Secondary | ICD-10-CM | POA: Diagnosis not present

## 2020-02-21 DIAGNOSIS — R739 Hyperglycemia, unspecified: Secondary | ICD-10-CM

## 2020-02-21 DIAGNOSIS — Z0001 Encounter for general adult medical examination with abnormal findings: Secondary | ICD-10-CM | POA: Diagnosis not present

## 2020-02-21 DIAGNOSIS — I4811 Longstanding persistent atrial fibrillation: Secondary | ICD-10-CM

## 2020-02-21 DIAGNOSIS — J432 Centrilobular emphysema: Secondary | ICD-10-CM

## 2020-02-21 DIAGNOSIS — N529 Male erectile dysfunction, unspecified: Secondary | ICD-10-CM

## 2020-02-21 DIAGNOSIS — R1030 Lower abdominal pain, unspecified: Secondary | ICD-10-CM

## 2020-02-21 DIAGNOSIS — G4700 Insomnia, unspecified: Secondary | ICD-10-CM

## 2020-02-21 DIAGNOSIS — L719 Rosacea, unspecified: Secondary | ICD-10-CM

## 2020-02-21 LAB — LIPID PANEL
Cholesterol: 201 mg/dL — ABNORMAL HIGH (ref 0–200)
HDL: 51.7 mg/dL (ref >39.00)
LDL Cholesterol: 124 mg/dL — ABNORMAL HIGH (ref 0–99)
NonHDL: 149.71
Total CHOL/HDL Ratio: 4
Triglycerides: 129 mg/dL (ref 0.0–149.0)
VLDL: 25.8 mg/dL (ref 0.0–40.0)

## 2020-02-21 LAB — HEMOGLOBIN A1C: Hgb A1c MFr Bld: 5.9 % (ref 4.6–6.5)

## 2020-02-21 MED ORDER — TADALAFIL 20 MG PO TABS: 10.0000 mg | ORAL_TABLET | ORAL | 11 refills | Status: DC | PRN

## 2020-02-21 NOTE — Progress Notes (Signed)
I acted as a Neurosurgeon for [employer], PA-C [name], LPN  Subjective:    Maurice Reed is a 79 y.o. male and is here for a comprehensive physical exam.   HPI  There are no preventive care reminders to display for this patient.  Acute Concerns: Abdominal pain -- ongoing constipation, eating saurkraut and trying to push fluids (despite his fluid restriction of 8 cups daily) to help with this. Takes BID colace. Took miralax at one point but states that it didn't really help him. Gets a pain in lower abdomen that feels like he needs to have a BM. When he does have a BM he has relief of symptoms. Denies unintentional weight loss, rectal bleeding. He states that he UTD on colonoscopy and apparently has done cologaurd however I do not see this result. Elevated blood sugar --has been elevated in the past, has never had a hemoglobin A1c.  Has ongoing weight gain.  Does have polydipsia but he does not know if this is related to his fluid restriction.  Denies polyuria.  Chronic Issues: Atrial fibrillation; Essential HTN -- he will increase his Torsemide to 40 mg BID on his own if he has significant weight gain-- cannot tell me how often he does this however. Does weigh himself daily. He is UTD with his visits to cardiology.  He takes Eliquis 5 mg twice a day as well as his metoprolol 25 mg tablet daily.  Denies chest pain or unusual shortness of breath. Pulmonary emphysema --he uses on a as needed oxygen, currently in our office is on 3 L because he was walking around to get to the office.  Usually is on about 2 L/day.  He is up-to-date with seeing Dr. Vassie Loll, his pulmonologist. Clois Dupes reports that he has been having issues with insomnia for quite some time, and he takes as needed temazepam for this.  He states that this is effective when he takes it.  Denies any recent falls while on this medication.  He knows not to operate heavy machinery with this medication. 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  He sees his eye doctor soon, he is wondering if he can come off the oral doxycycline.  ED -- would like refill on cialis, apparently he wasn't able to pick up the prior rx because it got misplaced  Health Maintenance: Immunizations -- UTD Colonoscopy --Cologuard per patient PSA --defer due to age Diet -- tries to avoid salt, does admit to drinking way too much fluid Caffeine -- drinks 2 cups of coffee daily Sleep habits -- no issues falling asleep Exercise -- very limited due to oxygen I Lupita Leash will so good today on yet more Weight -- Weight: 285 lb 6.4 oz (129.5 kg)  Recent weight history Wt Readings from Last 10 Encounters:  02/21/20 285 lb 6.4 oz (129.5 kg)  02/03/20 275 lb 9.6 oz (125 kg)  01/28/20 275 lb (124.7 kg)  12/23/19 277 lb 6.4 oz (125.8 kg)  12/19/19 275 lb 6.4 oz (124.9 kg)  12/17/19 258 lb (117 kg)  12/13/19 281 lb (127.5 kg)  09/18/19 273 lb 6.4 oz (124 kg)  08/23/19 276 lb (125.2 kg)  08/16/19 267 lb (121.1 kg)   Mood --overall stable Alcohol use --none Tobacco use --none  Depression screen PHQ 2/9 02/21/2020  Decreased Interest 0  Down, Depressed, Hopeless 0  PHQ - 2 Score 0  Altered sleeping 0  Tired, decreased energy  0  Change in appetite 0  Feeling bad or failure about yourself  0  Trouble concentrating 0  Moving slowly or fidgety/restless 0  Suicidal thoughts 0  PHQ-9 Score 0    Other providers/specialists: Patient Care Team: Jarold Motto, [name] as PCP - General (Physician Assistant) Runell Gess, MD as PCP - Cardiology (Cardiology) Oretha Milch, MD as Consulting Physician (Pulmonary Disease) Runell Gess, MD as Consulting Physician (Cardiology)    PMHx, SurgHx, SocialHx, Medications, and Allergies were reviewed in the Visit Navigator and updated as appropriate.   Past Medical History:  Diagnosis Date  . Arthritis   . Atrial fibrillation (HCC)   . 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 GERD (gastroesophageal reflux disease)   . Kidney stones   . Obesity      Past Surgical History:  Procedure Laterality Date  . APPENDECTOMY  1963  . LITHOTRIPSY  1987  . NOSE SURGERY  2003  . TONSILLECTOMY AND ADENOIDECTOMY  1948  . UMBILICAL HERNIA REPAIR  1984     Family History  Problem Relation Age of Onset  . Arthritis Mother   . Hearing loss Mother   . Hypertension Mother   . Heart disease Mother   . Stroke Mother   . Arthritis Father   . Hearing loss Father   . Heart disease Father   . Hypertension Father   . Heart attack Father   . Kidney disease Father   . Stroke Father     Social History   Tobacco Use  . Smoking status: Former Smoker    Packs/day: 3.00    Years: 25.00    Pack years: 75.00    Types: Cigarettes    Quit date: 11/28/1984    Years since quitting: 35.2  . Smokeless tobacco: Never Used  Substance Use Topics  . Alcohol use: Never  . Drug use: Never    Review of Systems:   Review of Systems  Constitutional: Negative for chills, fever, malaise/fatigue and weight loss.  HENT: Negative for hearing loss, sinus pain and sore throat.   Respiratory: Negative for cough and hemoptysis.   Cardiovascular: Negative for chest pain, palpitations, leg swelling and PND.  Gastrointestinal: Positive for abdominal pain and constipation. Negative for diarrhea, heartburn, nausea and vomiting.  Genitourinary: Negative for dysuria, frequency and urgency.  Musculoskeletal: Negative for back pain, myalgias and neck pain.  Skin: Negative for itching and rash.  Neurological: Negative for dizziness, tingling, seizures and headaches.  Endo/Heme/Allergies: Negative for polydipsia.  Psychiatric/Behavioral: Negative for depression. The patient is not nervous/anxious.       Objective:    Vitals:   02/21/20 1026  BP: 130/80  Pulse: 98  Temp: 98.4 F (36.9 C)  SpO2: 96%   Body mass index is 44.7 kg/m.  General   Alert, cooperative, no distress, appears stated age  Head:  Normocephalic, without obvious abnormality, atraumatic  Eyes:  PERRL, conjunctiva/corneas clear, EOM's intact, fundi benign, both eyes       Ears:  Normal TM's and external ear canals, both ears  Nose: Nares normal, septum midline, mucosa normal, no drainage or sinus tenderness  Throat: Lips, mucosa, and tongue normal; teeth and gums normal  Neck: Supple, symmetrical, trachea midline, no adenopathy;     thyroid:  No enlargement/tenderness/nodules; no carotid bruit or JVD  Back:   Symmetric, no curvature, ROM normal, no CVA tenderness  Lungs:   Clear to auscultation bilaterally, respirations  unlabored  Chest wall:  No tenderness or deformity  Heart:   Irregularly irregular, S1 and S2 normal, no murmur, rub or gallop  Abdomen:   Soft, non-tender, bowel sounds active all four quadrants, no masses, no organomegaly  Extremities: Extremities normal, atraumatic, no cyanosis or edema  Prostate : Not done   Skin: Skin color, texture, turgor normal, no rashes or lesions  Lymph nodes: Cervical, supraclavicular, and axillary nodes normal  Neurologic: CNII-XII grossly intact. Normal strength, sensation and reflexes throughout    AssessmentPlan:   Darell was seen today for annual exam.  Diagnoses and all orders for this visit:  Encounter for general adult medical examination with abnormal findings Today patient counseled on age appropriate routine health concerns for screening and prevention, each reviewed and up to date or declined. Immunizations reviewed and up to date or declined. Labs ordered and reviewed. Risk factors for depression reviewed and negative. Hearing function and visual acuity are intact. ADLs screened and addressed as needed. Functional ability and level of safety reviewed and appropriate. Education, counseling and referrals performed based on assessed risks today. Patient provided with a copy of personalized plan for  preventive services.  Elevated blood sugar We will update his labs today, including a hemoglobin A1c especially due to his weight gain.  Encourage better eating. -     Hemoglobin A1c  Essential hypertension; Longstanding persistent atrial fibrillation (HCC) Stable per patient, further management per cardiology. -     Lipid panel  Lower abdominal pain Patient is in agreement to referral to GI for further evaluation management of constipation versus other issues.  Centrilobular emphysema (HCC) Stable per patient, further management per pulmonology.  Rosacea Recommend that he follow-up with his eye doctor to see if he continues to need doxycycline.  He would like to scale back on medications if needed.  She will let me know what they decide.  Obesity, unspecified classification, unspecified obesity type, unspecified whether serious comorbidity present Encourage exercise as lung function allows, continue to work on better diet.  Insomnia, unspecified type Continues to do well with temazepam, denies falls.  Follow-up as needed.  Erectile dysfunction, unspecified erectile dysfunction type Will prescribe Cialis as needed.  Follow-up as needed.  Other orders -     tadalafil (CIALIS) 20 MG tablet; Take 0.5-1 tablets (10-20 mg total) by mouth every other day as needed for erectile dysfunction.     Well Adult Exam: Labs ordered: Yes. Patient counseling was done. See below for items discussed. Discussed the patient's BMI.  The BMI BMI is not in the acceptable range; BMI management plan is completed Follow up in 6 months. Colon Cancer: UTD per patient  Patient Counseling: [x]   Nutrition: Stressed importance of moderation in sodium/caffeine intake, saturated fat and cholesterol, caloric balance, sufficient intake of fresh fruits, vegetables, and fiber  [x]   Stressed the importance of regular exercise.   []   Substance Abuse: Discussed cessation/primary prevention of tobacco, alcohol, or other  drug use; driving or other dangerous activities under the influence; availability of treatment for abuse.   [x]   Injury prevention: Discussed safety belts, safety helmets, smoke detector, smoking near bedding or upholstery.   []   Sexuality: Discussed sexually transmitted diseases, partner selection, use of condoms, avoidance of unintended pregnancy  and contraceptive alternatives.   [x]   Dental health: Discussed importance of regular tooth brushing, flossing, and dental visits.  [x]   Health maintenance and immunizations reviewed. Please refer to Health maintenance section.   CMA or LPN served as  scribe during this visit. History, Physical, and Plan performed by medical provider. The above documentation has been reviewed and is accurate and complete.   Inda Coke, PA-C [place]

## 2020-02-21 NOTE — Patient Instructions (Addendum)
It was great to see you!  Please go to the lab for blood work.   1- you will be contacted about your referral to the stomach doctor 2- you can take the cialis prescription and goodrx card to [facility] to see if you can get your prescription for a cheaper price 3- ask your eye doctor about the doxycycline and if we still need to continue it  Our office will call you with your results unless you have chosen to receive results via MyChart.  If your blood work is normal we will follow-up each year for physicals and as scheduled for chronic medical problems.  If anything is abnormal we will treat accordingly and get you in for a follow-up.  Take care,  [facility] Maintenance, Male Adopting a healthy lifestyle and getting preventive care are important in promoting health and wellness. Ask your health care provider about:  The right schedule for you to have regular tests and exams.  Things you can do on your own to prevent diseases and keep yourself healthy. What should I know about diet, weight, and exercise? Eat a healthy diet   Eat a diet that includes plenty of vegetables, fruits, low-fat dairy products, and lean protein.  Do not eat a lot of foods that are high in solid fats, added sugars, or sodium. Maintain a healthy weight Body mass index (BMI) is a measurement that can be used to identify possible weight problems. It estimates body fat based on height and weight. Your health care provider can help determine your BMI and help you achieve or maintain a healthy weight. Get regular exercise Get regular exercise. This is one of the most important things you can do for your health. Most adults should:  Exercise for at least 150 minutes each week. The exercise should increase your heart rate and make you sweat (moderate-intensity exercise).  Do strengthening exercises at least twice a week. This is in addition to the moderate-intensity exercise.  Spend less time  sitting. Even light physical activity can be beneficial. Watch cholesterol and blood lipids Have your blood tested for lipids and cholesterol at 79 years of age, then have this test every 5 years. You may need to have your cholesterol levels checked more often if:  Your lipid or cholesterol levels are high.  You are older than 79 years of age.  You are at high risk for heart disease. What should I know about cancer screening? Many types of cancers can be detected early and may often be prevented. Depending on your health history and family history, you may need to have cancer screening at various ages. This may include screening for:  Colorectal cancer.  Prostate cancer.  Skin cancer.  Lung cancer. What should I know about heart disease, diabetes, and high blood pressure? Blood pressure and heart disease  High blood pressure causes heart disease and increases the risk of stroke. This is more likely to develop in people who have high blood pressure readings, are of African descent, or are overweight.  Talk with your health care provider about your target blood pressure readings.  Have your blood pressure checked: ? Every 3-5 years if you are 66-30 years of age. ? Every year if you are 56 years old or older.  If you are between the ages of 108 and 44 and are a current or former smoker, ask your health care provider if you should have a one-time screening for abdominal aortic aneurysm (AAA). Diabetes  Have regular diabetes screenings. This checks your fasting blood sugar level. Have the screening done:  Once every three years after age 62 if you are at a normal weight and have a low risk for diabetes.  More often and at a younger age if you are overweight or have a high risk for diabetes. What should I know about preventing infection? Hepatitis B If you have a higher risk for hepatitis B, you should be screened for this virus. Talk with your health care provider to find out if you  are at risk for hepatitis B infection. Hepatitis C Blood testing is recommended for:  Everyone born from [birth year] through 1965.  Anyone with known risk factors for hepatitis C. Sexually transmitted infections (STIs)  You should be screened each year for STIs, including gonorrhea and chlamydia, if: ? You are sexually active and are younger than 79 years of age. ? You are older than 79 years of age and your health care provider tells you that you are at risk for this type of infection. ? Your sexual activity has changed since you were last screened, and you are at increased risk for chlamydia or gonorrhea. Ask your health care provider if you are at risk.  Ask your health care provider about whether you are at high risk for HIV. Your health care provider may recommend a prescription medicine to help prevent HIV infection. If you choose to take medicine to prevent HIV, you should first get tested for HIV. You should then be tested every 3 months for as long as you are taking the medicine. Follow these instructions at home: Lifestyle  Do not use any products that contain nicotine or tobacco, such as cigarettes, e-cigarettes, and chewing tobacco. If you need help quitting, ask your health care provider.  Do not use street drugs.  Do not share needles.  Ask your health care provider for help if you need support or information about quitting drugs. Alcohol use  Do not drink alcohol if your health care provider tells you not to drink.  If you drink alcohol: ? Limit how much you have to 0-2 drinks a day. ? Be aware of how much alcohol is in your drink. In the [place] one drink equals one 12 oz bottle of beer (355 mL), one 5 oz glass of wine (148 mL), or one 1 oz glass of hard liquor (44 mL). General instructions  Schedule regular health, dental, and eye exams.  Stay current with your vaccines.  Tell your health care provider if: ? You often feel depressed. ? You have ever been abused or do  not feel safe at home. Summary  Adopting a healthy lifestyle and getting preventive care are important in promoting health and wellness.  Follow your health care provider's instructions about healthy diet, exercising, and getting tested or screened for diseases.  Follow your health care provider's instructions on monitoring your cholesterol and blood pressure. This information is not intended to replace advice given to you by your health care provider. Make sure you discuss any questions you have with your health care provider. Document Revised: 11/14/2018 Document Reviewed: 11/14/2018 Elsevier Patient Education  2020 [employer].

## 2020-02-23 ENCOUNTER — Other Ambulatory Visit: Payer: Self-pay | Admitting: Physician Assistant

## 2020-02-25 ENCOUNTER — Other Ambulatory Visit: Payer: Self-pay | Admitting: Physician Assistant

## 2020-03-02 ENCOUNTER — Encounter: Payer: Self-pay | Admitting: *Deleted

## 2020-03-03 ENCOUNTER — Ambulatory Visit: Payer: Medicare Other | Admitting: Gastroenterology

## 2020-03-03 ENCOUNTER — Encounter: Payer: Self-pay | Admitting: Gastroenterology

## 2020-03-03 VITALS — BP 132/70 | HR 74 | Temp 97.4°F | Ht 69.0 in | Wt 279.2 lb

## 2020-03-03 DIAGNOSIS — R1031 Right lower quadrant pain: Secondary | ICD-10-CM | POA: Diagnosis not present

## 2020-03-03 DIAGNOSIS — K5909 Other constipation: Secondary | ICD-10-CM | POA: Diagnosis not present

## 2020-03-03 DIAGNOSIS — R1032 Left lower quadrant pain: Secondary | ICD-10-CM

## 2020-03-03 NOTE — Patient Instructions (Signed)
Please purchase the following medications over the counter and take as directed: Miralax 1 capful in 8 ounces water/juice daily  Call our office back in 4 weeks with an update; ask for Patty P.  If you are age 79 or older, your body mass index should be between 23-30. Your Body mass index is 41.24 kg/m. If this is out of the aforementioned range listed, please consider follow up with your Primary Care Provider.  If you are age 68 or younger, your body mass index should be between 19-25. Your Body mass index is 41.24 kg/m. If this is out of the aformentioned range listed, please consider follow up with your Primary Care Provider.

## 2020-03-03 NOTE — Progress Notes (Signed)
03/03/2020 Maurice Reed [id number] 09-13-1941   HISTORY OF PRESENT ILLNESS: This is a 79 year old male who is new to our office.  He has been referred here by his PCP, Inda Coke, PA-C, for evaluation regarding lower abdominal pain.  He says that it is not so much pain as it is discomfort.  He says that it comes and goes.  Describes it as pressure in his lower abdomen.  Has a lot of gas throughout the day.  He says that recently he has been having a bowel movement every other day or so, but prior to that he was only having about 2 bowel movements per week.  He had a CT scan of the abdomen/pelvis with contrast in January that was fairly unremarkable with no cause of his symptoms identified.  He denies rectal bleeding.  He has persistent atrial fibrillation and history of PE years ago, on Eliquis.  Is oxygen dependent for the past 20 years or so.  He tells me his last colonoscopy was about 10 years ago.  He says that he did some type of stool testing about 5 years ago and recently received a notification to do it again.  CBC, CMP, TSH were all fairly unremarkable/normal when checked in January.  Past Medical History:  Diagnosis Date  . Arthritis   . Atrial fibrillation ([place])   . 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 Diverticula, bladder   . Erectile dysfunction   . GERD (gastroesophageal reflux disease)   . Kidney stones   . Obesity   . Pulmonary embolism [facility])    Past Surgical History:  Procedure Laterality Date  . APPENDECTOMY  1963  . LITHOTRIPSY  1987  . NOSE SURGERY  2003  . TONSILLECTOMY AND ADENOIDECTOMY  1948  . [place]    reports that he quit smoking about 35 years ago. His smoking use included cigarettes. He has a 75.00 pack-year smoking history. He has never used smokeless tobacco. He reports that he does not drink alcohol or use drugs. family history includes Arthritis in his father and mother; Hearing loss in his father  and mother; Heart attack in his father; Heart disease in his father and mother; Hypertension in his father and mother; Kidney disease in his father; Stroke in his father and mother. Allergies  Allergen Reactions  . Other Itching    Cats: watery eyes, itching, sneezing      Outpatient Encounter Medications as of 03/03/2020  Medication Sig  . Ascorbic Acid (VITAMIN C) 500 MG CAPS Take 1 capsule by mouth daily.  . Atropine Sulfate-NaCl 0.01-0.9 % SOLN Apply 1 drop to eye at bedtime.   . Cholecalciferol (VITAMIN D3) 3000 units TABS Take by mouth.  . clobetasol (TEMOVATE) 0.05 % external solution APPLY TO AFFECTED AREA(S)  ON SCALP TOPICALLY ONE TO  TWO TIMES A DAY AS NEEDED  . desonide (DESOWEN) 0.05 % lotion Apply topically.  . docusate sodium (COLACE) 100 MG capsule Take 100 mg by mouth daily.   Marland Kitchen ELIQUIS 5 MG TABS tablet TAKE 1 TABLET BY MOUTH  TWICE DAILY  . erythromycin ophthalmic ointment APP THIN LAYER IN OS BID  . magnesium oxide (MAG-OX) 400 MG tablet TAKE 1 TABLET BY MOUTH TWICE A DAY  . MAGNESIUM-OXIDE 400 (241.3 Mg) MG tablet TAKE 1 TABLET BY MOUTH TWICE DAILY  . metoprolol succinate (TOPROL-XL) 25 MG 24 hr tablet TAKE 1 TABLET BY MOUTH  DAILY  .  mometasone (NASONEX) 50 MCG/ACT nasal spray Place 2 sprays into the nose daily. (Patient taking differently: Place 2 sprays into the nose as needed. )  . NARCAN 4 MG/0.1ML LIQD nasal spray kit INSTILL ONE SPRAY PRN FOR ACCIDENTAL OVERDOSE  . oxyCODONE (OXY IR/ROXICODONE) 5 MG immediate release tablet Take 5 mg by mouth 2 (two) times daily as needed. for pain  . potassium chloride (MICRO-K) 10 MEQ CR capsule TAKE 1 CAPSULE BY MOUTH  TWICE DAILY  . prednisoLONE acetate (PRED FORTE) 1 % ophthalmic suspension Place 1 drop into the left eye 2 (two) times daily.   . tadalafil (CIALIS) 20 MG tablet Take 0.5-1 tablets (10-20 mg total) by mouth every other day as needed for erectile dysfunction.  . Tafluprost, PF, 0.0015 % SOLN Apply 1 drop to eye  daily. Drop 1 drop into right at night  . tamsulosin (FLOMAX) 0.4 MG CAPS capsule TAKE 1 CAPSULE BY MOUTH AT  BEDTIME  . torsemide (DEMADEX) 20 MG tablet Take 1 tablet (20 mg total) by mouth 2 (two) times daily.  Marland Kitchen triamcinolone cream (KENALOG) 0.1 % APPLY AA BID  . Tafluprost (ZIOPTAN) 0.0015 % SOLN Apply 1 drop to eye at bedtime. Right eye  . [DISCONTINUED] doxycycline (VIBRA-TABS) 100 MG tablet TAKE 1 TABLET BY MOUTH  DAILY  . [DISCONTINUED] temazepam (RESTORIL) 15 MG capsule Take 1 capsule (15 mg total) by mouth at bedtime as needed for sleep.   No facility-administered encounter medications on file as of 03/03/2020.     REVIEW OF SYSTEMS  : All other systems reviewed and negative except where noted in the History of Present Illness.   PHYSICAL EXAM: BP 132/70   Pulse 74   Temp (!) 97.4 F (36.3 C)   Ht '5\' 9"'  (1.753 m)   Wt 279 lb 4 oz (126.7 kg)   BMI 41.24 kg/m  General: Well developed white male in no acute distress Head: Normocephalic and atraumatic Eyes:  Sclerae anicteric, conjunctiva pink. Ears: Normal auditory acuity Lungs: Clear throughout to auscultation; no increased WOB. Heart:  Irregularly irregular. Abdomen: Soft, non-distended.  BS present.  Non-tender. Musculoskeletal: Symmetrical with no gross deformities  Skin: No lesions on visible extremities Extremities: No edema  Neurological: Alert oriented x 4, grossly non-focal Psychological:  Alert and cooperative. Normal mood and affect  ASSESSMENT AND PLAN: *79 year old male with constipation, likely medication induced and complaints of lower abdominal discomfort, intermittent.  CT scan of the abdomen pelvis with contrast was unremarkable.  He reports having colonoscopy about 10 years ago or so. 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  He is agreeable to try to avoid this as well.  I wonder if some of his discomfort is just related to constipation and not having regular bowel habits.  I have asked him to begin taking MiraLAX daily to see if this helps.  He will call our office back in approximately 1 month with an update on his symptoms.   CC:  Inda Coke, [place]

## 2020-03-03 NOTE — Progress Notes (Signed)
Assessment of complex patient noted

## 2020-03-05 ENCOUNTER — Other Ambulatory Visit: Payer: Self-pay

## 2020-03-05 DIAGNOSIS — L989 Disorder of the skin and subcutaneous tissue, unspecified: Secondary | ICD-10-CM

## 2020-03-05 DIAGNOSIS — L719 Rosacea, unspecified: Secondary | ICD-10-CM

## 2020-03-05 MED ORDER — TRIAMCINOLONE ACETONIDE 0.1 % EX CREA: TOPICAL_CREAM | CUTANEOUS | 0 refills | Status: DC

## 2020-03-10 ENCOUNTER — Other Ambulatory Visit: Payer: Self-pay | Admitting: Physician Assistant

## 2020-03-11 ENCOUNTER — Other Ambulatory Visit: Payer: Self-pay | Admitting: *Deleted

## 2020-03-11 DIAGNOSIS — L989 Disorder of the skin and subcutaneous tissue, unspecified: Secondary | ICD-10-CM

## 2020-03-11 MED ORDER — TRIAMCINOLONE ACETONIDE 0.1 % EX CREA: TOPICAL_CREAM | CUTANEOUS | 3 refills | Status: DC

## 2020-03-13 ENCOUNTER — Telehealth: Payer: Self-pay | Admitting: Cardiovascular Disease

## 2020-03-13 NOTE — Telephone Encounter (Signed)
Left a message that the patient should go to the ED as advised.

## 2020-03-13 NOTE — Telephone Encounter (Signed)
The patient's wife has been made aware of the suggestion to go to the ED. The patient has once again refused stating he has felt like this for 2 weeks.  She will call back on Monday morning if she cannot get a ride for the patient to the appointment.

## 2020-03-13 NOTE — Telephone Encounter (Signed)
The patient's wife called in stating that the patient is weak and unsteady on his feet. He has chronic afib and has been symptomatic for 19 days.   The wife stated that the patient cannot have a cardioversion due to pulmonary issues. They have become very frustrated as the patient has been symptomatic.  She has been advised that due to the patient's symptoms that he should go to the ED to be medically cardioverted. She stated that she will ask him but he will refuse.   She stated that his heart rate was typically int he 80-90's and blood pressure was around 130/80's.  Dr. Allyson Sabal has also advised that the patient go to the ED due to his symptoms.  An appointment has been made for Monday 4/12

## 2020-03-13 NOTE — Telephone Encounter (Signed)
Left a message for the patient to call back.  

## 2020-03-13 NOTE — Telephone Encounter (Signed)
New message  Patient c/o Palpitations:  High priority if patient c/o lightheadedness, shortness of breath, or chest pain  1) How long have you had palpitations/irregular HR/ Afib? Are you having the symptoms now? Yes  2) Are you currently experiencing lightheadedness, SOB or CP? Weak, Wobbly on feet, lightheadedness,   3) Do you have a history of afib (atrial fibrillation) or irregular heart rhythm? Yes  4) Have you checked your BP or HR? (document readings if available): n/a  5) Are you experiencing any other symptoms? Patient has been in afib for 19 days, lightheadedness, wobbly on feet,

## 2020-03-16 ENCOUNTER — Other Ambulatory Visit: Payer: Self-pay | Admitting: Physician Assistant

## 2020-03-16 ENCOUNTER — Ambulatory Visit: Payer: Medicare Other | Admitting: Physician Assistant

## 2020-03-16 DIAGNOSIS — R5381 Other malaise: Secondary | ICD-10-CM

## 2020-03-16 DIAGNOSIS — Z1211 Encounter for screening for malignant neoplasm of colon: Secondary | ICD-10-CM

## 2020-04-01 ENCOUNTER — Other Ambulatory Visit: Payer: Self-pay | Admitting: Physician Assistant

## 2020-04-01 ENCOUNTER — Telehealth: Payer: Self-pay | Admitting: Physician Assistant

## 2020-04-01 ENCOUNTER — Telehealth: Payer: Self-pay | Admitting: Cardiovascular Disease

## 2020-04-01 MED ORDER — TRIAMCINOLONE ACETONIDE 0.5 % EX OINT: TOPICAL_OINTMENT | CUTANEOUS | 0 refills | Status: DC

## 2020-04-01 NOTE — Telephone Encounter (Signed)
Called and spoke with pt. Notified it was okay for his wife to accompany him to his appt. Pt verbalized understanding with no other questions at this time.

## 2020-04-01 NOTE — Telephone Encounter (Signed)
Sent!

## 2020-04-01 NOTE — Telephone Encounter (Signed)
New Message   Patient is calling in to get approval for his wife to accompany him to his appointment on Friday 04/03/20. Patient states that he has trouble keeping his balance and trouble hearing and would need his wife with him. Please give patient a call back to confirm.

## 2020-04-01 NOTE — Telephone Encounter (Signed)
MEDICATION: triamcinolone cream (KENALOG) 0.1 %  PHARMACY:  [facility] [place], [place] - [address] Phone:  [phone]  Fax:  [phone]       Comments: Patient would like it to be .5% instead of one           **Let patient know to contact pharmacy at the end of the day to make sure medication is ready. **  ** Please notify patient to allow 48-72 hours to process**  **Encourage patient to contact the pharmacy for refills or they can request refills through [facility]**

## 2020-04-01 NOTE — Telephone Encounter (Signed)
Please see message and advise 

## 2020-04-02 NOTE — Telephone Encounter (Signed)
Left message on voicemail Rx was sent to pharmacy.  

## 2020-04-03 ENCOUNTER — Ambulatory Visit (INDEPENDENT_AMBULATORY_CARE_PROVIDER_SITE_OTHER): Payer: Medicare Other

## 2020-04-03 ENCOUNTER — Ambulatory Visit: Payer: Medicare Other | Admitting: Physician Assistant

## 2020-04-03 ENCOUNTER — Encounter: Payer: Self-pay | Admitting: Physician Assistant

## 2020-04-03 ENCOUNTER — Other Ambulatory Visit: Payer: Self-pay

## 2020-04-03 VITALS — BP 135/76 | HR 88 | Temp 97.9°F | Resp 18 | Ht 69.0 in | Wt 282.0 lb

## 2020-04-03 DIAGNOSIS — Z79899 Other long term (current) drug therapy: Secondary | ICD-10-CM

## 2020-04-03 DIAGNOSIS — Z0000 Encounter for general adult medical examination without abnormal findings: Secondary | ICD-10-CM | POA: Diagnosis not present

## 2020-04-03 DIAGNOSIS — R42 Dizziness and giddiness: Secondary | ICD-10-CM

## 2020-04-03 DIAGNOSIS — R531 Weakness: Secondary | ICD-10-CM

## 2020-04-03 DIAGNOSIS — R0600 Dyspnea, unspecified: Secondary | ICD-10-CM

## 2020-04-03 DIAGNOSIS — I482 Chronic atrial fibrillation, unspecified: Secondary | ICD-10-CM

## 2020-04-03 DIAGNOSIS — I10 Essential (primary) hypertension: Secondary | ICD-10-CM

## 2020-04-03 DIAGNOSIS — I2699 Other pulmonary embolism without acute cor pulmonale: Secondary | ICD-10-CM

## 2020-04-03 DIAGNOSIS — G4733 Obstructive sleep apnea (adult) (pediatric): Secondary | ICD-10-CM

## 2020-04-03 MED ORDER — POTASSIUM CHLORIDE ER 10 MEQ PO CPCR: ORAL_CAPSULE | ORAL | 0 refills | Status: DC

## 2020-04-03 MED ORDER — TORSEMIDE 20 MG PO TABS: ORAL_TABLET | ORAL | 0 refills | Status: DC

## 2020-04-03 NOTE — Patient Instructions (Signed)
[name] , Thank you for taking time to come for your Medicare Wellness Visit. I appreciate your ongoing commitment to your health goals. Please review the following plan we discussed and let me know if I can assist you in the future.   Screening recommendations/referrals: Colorectal Screening: No longer indicated  Vision and Dental Exams: Recommended annual ophthalmology exams for early detection of glaucoma and other disorders of the eye Recommended annual dental exams for proper oral hygiene  Vaccinations: Influenza vaccine: completed 08/23/19 Pneumococcal vaccine: Prevnar recommended; Pneumovax received 12/14/15 Tdap vaccine: recommended every 10 years; Please call your insurance company to determine your out of pocket expense. You also receive this vaccine at your local pharmacy or Health Dept. Shingles vaccine:  You may receive this vaccine at your local pharmacy. (see handout)  Covid vaccine: recommended   Advanced directives: Please bring a copy of your POA (Power of Attorney) and/or Living Will to your next appointment.  Goals: Recommend to drink at least 6-8 8oz glasses of water per day and consume a balanced diet rich in fresh fruits and vegetables.   Next appointment: Please schedule your Annual Wellness Visit with your Nurse Health Advisor in one year.  Preventive Care 11 Years and Older, Male Preventive care refers to lifestyle choices and visits with your health care provider that can promote health and wellness. What does preventive care include?  A yearly physical exam. This is also called an annual well check.  Dental exams once or twice a year.  Routine eye exams. Ask your health care provider how often you should have your eyes checked.  Personal lifestyle choices, including:  Daily care of your teeth and gums.  Regular physical activity.  Eating a healthy diet.  Avoiding tobacco and drug use.  Limiting alcohol use.  Practicing safe sex.  Taking low  doses of aspirin every day if recommended by your health care provider..  Taking vitamin and mineral supplements as recommended by your health care provider. What happens during an annual well check? The services and screenings done by your health care provider during your annual well check will depend on your age, overall health, lifestyle risk factors, and family history of disease. Counseling  Your health care provider may ask you questions about your:  Alcohol use.  Tobacco use.  Drug use.  Emotional well-being.  Home and relationship well-being.  Sexual activity.  Eating habits.  History of falls.  Memory and ability to understand (cognition).  Work and work Statistician. Screening  You may have the following tests or measurements:  Height, weight, and BMI.  Blood pressure.  Lipid and cholesterol levels. These may be checked every 5 years, or more frequently if you are over 72 years old.  Skin check.  Lung cancer screening. You may have this screening every year starting at age 58 if you have a 30-pack-year history of smoking and currently smoke or have quit within the past 15 years.  Fecal occult blood test (FOBT) of the stool. You may have this test every year starting at age 79.  Flexible sigmoidoscopy or colonoscopy. You may have a sigmoidoscopy every 5 years or a colonoscopy every 10 years starting at age 60.  Prostate cancer screening. Recommendations will vary depending on your family history and other risks.  Hepatitis C blood test.  Hepatitis B blood test.  Sexually transmitted disease (STD) testing.  Diabetes screening. This is done by checking your blood sugar (glucose) after you have not eaten for a while (fasting).  You may have this done every 1-3 years.  Abdominal aortic aneurysm (AAA) screening. You may need this if you are a current or former smoker.  Osteoporosis. You may be screened starting at age 83 if you are at high risk. Talk with  your health care provider about your test results, treatment options, and if necessary, the need for more tests. Vaccines  Your health care provider may recommend certain vaccines, such as:  Influenza vaccine. This is recommended every year.  Tetanus, diphtheria, and acellular pertussis (Tdap, Td) vaccine. You may need a Td booster every 10 years.  Zoster vaccine. You may need this after age 36.  Pneumococcal 13-valent conjugate (PCV13) vaccine. One dose is recommended after age 70.  Pneumococcal polysaccharide (PPSV23) vaccine. One dose is recommended after age 8. Talk to your health care provider about which screenings and vaccines you need and how often you need them. This information is not intended to replace advice given to you by your health care provider. Make sure you discuss any questions you have with your health care provider. Document Released: 12/18/2015 Document Revised: 08/10/2016 Document Reviewed: 09/22/2015 Elsevier Interactive Patient Education  2017 [employer].  Fall Prevention in the Home Falls can cause injuries. They can happen to people of all ages. There are many things you can do to make your home safe and to help prevent falls. What can I do on the outside of my home?  Regularly fix the edges of walkways and driveways and fix any cracks.  Remove anything that might make you trip as you walk through a door, such as a raised step or threshold.  Trim any bushes or trees on the path to your home.  Use bright outdoor lighting.  Clear any walking paths of anything that might make someone trip, such as rocks or tools.  Regularly check to see if handrails are loose or broken. Make sure that both sides of any steps have handrails.  Any raised decks and porches should have guardrails on the edges.  Have any leaves, snow, or ice cleared regularly.  Use sand or salt on walking paths during winter.  Clean up any spills in your garage right away. This  includes oil or grease spills. What can I do in the bathroom?  Use night lights.  Install grab bars by the toilet and in the tub and shower. Do not use towel bars as grab bars.  Use non-skid mats or decals in the tub or shower.  If you need to sit down in the shower, use a plastic, non-slip stool.  Keep the floor dry. Clean up any water that spills on the floor as soon as it happens.  Remove soap buildup in the tub or shower regularly.  Attach bath mats securely with double-sided non-slip rug tape.  Do not have throw rugs and other things on the floor that can make you trip. What can I do in the bedroom?  Use night lights.  Make sure that you have a light by your bed that is easy to reach.  Do not use any sheets or blankets that are too big for your bed. They should not hang down onto the floor.  Have a firm chair that has side arms. You can use this for support while you get dressed.  Do not have throw rugs and other things on the floor that can make you trip. What can I do in the kitchen?  Clean up any spills right away.  Avoid walking  on wet floors.  Keep items that you use a lot in easy-to-reach places.  If you need to reach something above you, use a strong step stool that has a grab bar.  Keep electrical cords out of the way.  Do not use floor polish or wax that makes floors slippery. If you must use wax, use non-skid floor wax.  Do not have throw rugs and other things on the floor that can make you trip. What can I do with my stairs?  Do not leave any items on the stairs.  Make sure that there are handrails on both sides of the stairs and use them. Fix handrails that are broken or loose. Make sure that handrails are as long as the stairways.  Check any carpeting to make sure that it is firmly attached to the stairs. Fix any carpet that is loose or worn.  Avoid having throw rugs at the top or bottom of the stairs. If you do have throw rugs, attach them to the  floor with carpet tape.  Make sure that you have a light switch at the top of the stairs and the bottom of the stairs. If you do not have them, ask someone to add them for you. What else can I do to help prevent falls?  Wear shoes that:  Do not have high heels.  Have rubber bottoms.  Are comfortable and fit you well.  Are closed at the toe. Do not wear sandals.  If you use a stepladder:  Make sure that it is fully opened. Do not climb a closed stepladder.  Make sure that both sides of the stepladder are locked into place.  Ask someone to hold it for you, if possible.  Clearly mark and make sure that you can see:  Any grab bars or handrails.  First and last steps.  Where the edge of each step is.  Use tools that help you move around (mobility aids) if they are needed. These include:  Canes.  Walkers.  Scooters.  Crutches.  Turn on the lights when you go into a dark area. Replace any light bulbs as soon as they burn out.  Set up your furniture so you have a clear path. Avoid moving your furniture around.  If any of your floors are uneven, fix them.  If there are any pets around you, be aware of where they are.  Review your medicines with your doctor. Some medicines can make you feel dizzy. This can increase your chance of falling. Ask your doctor what other things that you can do to help prevent falls. This information is not intended to replace advice given to you by your health care provider. Make sure you discuss any questions you have with your health care provider. Document Released: 09/17/2009 Document Revised: 04/28/2016 Document Reviewed: 12/26/2014 Elsevier Interactive Patient Education  2017 [employer].

## 2020-04-03 NOTE — Progress Notes (Addendum)
This visit is being conducted via phone call due to the COVID-19 pandemic. This patient has given me verbal consent via phone to conduct this visit, patient states they are participating from their home address. Some vital signs may be absent or patient reported.   Patient identification: identified by name, DOB, and current address.  Location provider: [facility], Office Persons participating in the virtual visit: Denman George LPN, patient, and Inda Coke PA     Subjective:   Maurice Reed is a 79 y.o. male who presents for Medicare Annual/Subsequent preventive examination.  Review of Systems:   Cardiac Risk Factors include: advanced age (>73mn, >>53women);male gender;dyslipidemia    Objective:    Vitals: There were no vitals taken for this visit.  There is no height or weight on file to calculate BMI.  Advanced Directives 04/03/2020 04/03/2019 01/22/2019  Does Patient Have a Medical Advance Directive? Yes No No  Type of [place]aramed[place]of [place] will - -  Does patient want to make changes to medical advance directive? No - Patient declined - -  Copy of [place] Chart? No - copy requested - -  Would patient like information on creating a medical advance directive? 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   Diverticula,  bladder    Erectile dysfunction    GERD (gastroesophageal reflux disease)    Kidney stones    Obesity    Pulmonary embolism ([place]    Past Surgical History:  Procedure Laterality Date   APPENDECTOMY  1963   LITHOTRIPSY  1987   NOSE SURGERY  2003   TONSILLECTOMY AND ADENOIDECTOMY  13300  UMBILICAL HERNIA REPAIR  1984   Family History  Problem Relation Age of Onset   Arthritis Mother    Hearing loss Mother    Hypertension Mother    Heart disease Mother    Stroke Mother    Arthritis Father    Hearing loss Father    Heart disease Father    Hypertension Father    Heart attack Father    Kidney disease Father    Stroke Father    Social History   Socioeconomic History   Marital status: Married    Spouse name: Not on file   Number of children: Not on file   Years of education: Not on file   Highest education level: Not on file  Occupational History   Not on file  Tobacco Use   Smoking status: Former Smoker    Packs/day: 3.00    Years: 25.00    Pack years: 75.00    Types: Cigarettes    Quit date: 11/28/1984    Years since quitting: 35.3   Smokeless tobacco: Never Used  Substance and Sexual Activity   Alcohol  use: Never   Drug use: Never   Sexual activity: Yes  Other Topics Concern   Not on file  Social History Narrative   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22)   Married to [employer] ([name])   7 children   Currently lives with son in [place], [place]; all their things are in storage; plan to travel and stay with kids but keep their home base in the [place] area   Social Determinants of Health   Financial Resource Strain:    Difficulty of Paying Living Expenses:   Food Insecurity:    Worried About Charity fundraiser in the Last Year:    Arboriculturist in the Last Year:   Transportation Needs:    Film/video editor (Medical):    Lack of Transportation (Non-Medical):   Physical Activity:    Days of  Exercise per Week:    Minutes of Exercise per Session:   Stress:    Feeling of Stress :   Social Connections:    Frequency of Communication with Friends and Family:    Frequency of Social Gatherings with Friends and Family:    Attends Religious Services:    Active Member of Clubs or Organizations:    Attends Archivist Meetings:    Marital Status:     Outpatient Encounter Medications as of 04/03/2020  Medication Sig   Ascorbic Acid (VITAMIN C) 500 MG CAPS Take 1 capsule by mouth daily.   Atropine Sulfate-NaCl 0.01-0.9 % SOLN Apply 1 drop to eye at bedtime.    Cholecalciferol (VITAMIN D3) 3000 units TABS Take by mouth.   clobetasol (TEMOVATE) 0.05 % external solution APPLY TO AFFECTED AREA(S)  ON SCALP TOPICALLY ONE TO  TWO TIMES A DAY AS NEEDED   desonide (DESOWEN) 0.05 % lotion Apply topically.   docusate sodium (COLACE) 100 MG capsule Take 100 mg by mouth daily.    ELIQUIS 5 MG TABS tablet TAKE 1 TABLET BY MOUTH  TWICE DAILY   erythromycin ophthalmic ointment APP THIN LAYER IN OS BID   magnesium oxide (MAG-OX) 400 MG tablet TAKE 1 TABLET BY MOUTH TWICE A DAY   MAGNESIUM-OXIDE 400 (241.3 Mg) MG tablet TAKE 1 TABLET BY MOUTH TWICE DAILY   metoprolol succinate (TOPROL-XL) 25 MG 24 hr tablet TAKE 1 TABLET BY MOUTH  DAILY   mometasone (NASONEX) 50 MCG/ACT nasal spray Place 2 sprays into the nose daily. (Patient taking differently: Place 2 sprays into the nose as needed. )   NARCAN 4 MG/0.1ML LIQD nasal spray kit INSTILL ONE SPRAY PRN FOR ACCIDENTAL OVERDOSE   oxyCODONE (OXY IR/ROXICODONE) 5 MG immediate release tablet Take 5 mg by mouth 2 (two) times daily as needed. for pain   potassium chloride (MICRO-K) 10 MEQ CR capsule TAKE 1 CAPSULE BY MOUTH  TWICE DAILY   prednisoLONE acetate (PRED FORTE) 1 % ophthalmic suspension Place 1 drop into the left eye 2 (two) times daily.    tadalafil (CIALIS) 20 MG tablet Take 0.5-1 tablets (10-20 mg total) by mouth every other day as needed for  erectile dysfunction.   Tafluprost (ZIOPTAN) 0.0015 % SOLN Apply 1 drop to eye at bedtime. Right eye   Tafluprost, PF, 0.0015 % SOLN Apply 1 drop to eye daily. Drop 1 drop into right at night   tamsulosin (FLOMAX) 0.4 MG CAPS capsule TAKE 1 CAPSULE BY MOUTH AT  BEDTIME   torsemide (DEMADEX) 20 MG tablet Take 1  tablet (20 mg total) by mouth 2 (two) times daily.   triamcinolone ointment (KENALOG) 0.5 % Apply to affected area 1-2 times daily   No facility-administered encounter medications on file as of 04/03/2020.    Activities of Daily Living In your present state of health, do you have any difficulty performing the following activities: 04/03/2020  Hearing? Y  Vision? N  Difficulty concentrating or making decisions? N  Walking or climbing stairs? Y  Dressing or bathing? N  Doing errands, shopping? Y  Preparing Food and eating ? N  Using the Toilet? N  In the past six months, have you accidently leaked urine? N  Do you have problems with loss of bowel control? N  Managing your Medications? N  Managing your Finances? N  Housekeeping or managing your Housekeeping? N  Some recent data might be hidden    Patient Care Team: Inda Coke, [place] as PCP - General (Physician Assistant) Lorretta Harp, MD as PCP - Cardiology (Cardiology) Rigoberto Noel, MD as Consulting Physician (Pulmonary Disease) Lorretta Harp, MD as Consulting Physician (Cardiology) Bordelon, Merry Lofty, MD as Consulting Physician (Ophthalmology) Jeanella Anton, NP as Nurse Practitioner (Pain Medicine)   Assessment:   This is a routine wellness examination for [place].  Exercise Activities and Dietary recommendations Current Exercise Habits: The patient does not participate in regular exercise at present  Goals   None     Fall Risk Fall Risk  04/03/2020 04/03/2019 05/22/2018  Falls in the past year? 0 0 No  Number falls in past yr: 0 - -  Injury with Fall? 0 - -  Risk for fall due to : Impaired mobility  - -  Follow up Falls evaluation completed;Education provided;Falls prevention discussed - -   Is the patient's home free of loose throw rugs in walkways, pet beds, electrical cords, etc?   yes      Grab bars in the bathroom? yes      Handrails on the stairs?   yes      Adequate lighting?   yes  Depression Screen PHQ 2/9 Scores 04/03/2020 02/21/2020 04/03/2019 05/22/2018  PHQ - 2 Score 0 0 0 0  PHQ- 9 Score - 0 - -    Cognitive Function- no cognitive concerns at this time      6CIT Screen 04/03/2020  What Year? 0 points  What month? 0 points  What time? 0 points  Count back from 20 0 points  Months in reverse 0 points  Repeat phrase 2 points  Total Score 2    Immunization History  Administered Date(s) Administered   Fluad Quad(high Dose 65+) 08/23/2019   Influenza, High Dose Seasonal PF 09/21/2018   Influenza, Seasonal, Injecte, Preservative Fre 09/28/2017   Pneumococcal Polysaccharide-23 12/14/2015    Qualifies for Shingles Vaccine? Discussed and patient will check with pharmacy for coverage.  Patient education handout provided   Screening Tests Health Maintenance  Topic Date Due   COVID-19 Vaccine (1) Never done   TETANUS/TDAP  Never done   PNA vac Low Risk Adult (2 of 2 - PCV13) 12/13/2016   INFLUENZA VACCINE  07/05/2020   Cancer Screenings: Lung: Low Dose CT Chest recommended if Age 63-80 years, 30 pack-year currently smoking OR have quit w/in 15years. Patient does not qualify. Colorectal: No longer indicated     Plan:  I have personally reviewed and addressed the Medicare Annual Wellness questionnaire and have noted the following in the patient's chart:  A. Medical and social history  B. Use of alcohol, tobacco or illicit drugs  C. Current medications and supplements D. Functional ability and status E.  Nutritional status F.  Physical activity G. Advance directives H. List of other physicians I.  Hospitalizations, surgeries, and ER visits in previous 12 months  J.  [place] such as hearing and vision if needed, cognitive and depression L. Referrals, records requested, and appointments- none   In addition, I have reviewed and discussed with patient certain preventive protocols, quality metrics, and best practice recommendations. A written personalized care plan for preventive services as well as general preventive health recommendations were provided to patient.   Signed,  Denman George, LPN  Nurse Health Advisor   Nurse Notes: no additional   I have reviewed documentation for AWV and Advance Care planning provided by [facility], I agree with documentation, I was immediately available for any questions. Inda Coke, [place]

## 2020-04-03 NOTE — Patient Instructions (Signed)
Medication Instructions:   TAKE Torsemide 40 mg in the mornings and 20 mg in the evenings  TAKE Potassium 20 meq in the mornings and 10 meq in the evenings  *If you need a refill on your cardiac medications before your next appointment, please call your pharmacy*   Lab Work: Your physician recommends that you return for lab work in 1 WEEK:   BMET  If you have labs (blood work) drawn today and your tests are completely normal, you will receive your results only by: Marland Kitchen MyChart Message (if you have MyChart) OR . A paper copy in the mail If you have any lab test that is abnormal or we need to change your treatment, we will call you to review the results.   Testing/Procedures: Your physician has requested that you have an echocardiogram. Echocardiography is a painless test that uses sound waves to create images of your heart. It provides your doctor with information about the size and shape of your heart and how well your heart's chambers and valves are working. This procedure takes approximately one hour. There are no restrictions for this procedure.   PLEASE SCHEDULE FOR IN 1-2 WEEK on a Monday morning or Friday afternoon   Follow-Up: At [facility], you and your health needs are our priority.  As part of our continuing mission to provide you with exceptional heart care, we have created designated Provider Care Teams.  These Care Teams include your primary Cardiologist (physician) and Advanced Practice Providers (APPs -  Physician Assistants and Nurse Practitioners) who all work together to provide you with the care you need, when you need it.   Your next appointment:   2 week(s)  The format for your next appointment:   In Person  Provider:   Azalee Course, PA-C or Judy Pimple, PA-C  Other Instructions

## 2020-04-03 NOTE — Progress Notes (Signed)
Cardiology Office Note:    Date:  04/06/2020   ID:  Maurice Reed, DOB 1941/10/27, MRN [id number]  PCP:  Inda Coke, PA  Cardiologist:  Quay Burow, MD  Electrophysiologist:  None   Referring MD: Inda Coke, PA   Chief Complaint  Patient presents with  . Follow-up    seen for Dr. Gwenlyn Found    History of Present Illness:    Maurice Reed is a 79 y.o. male with a hx of tobacco abuse quit 1985, HTN, OSA on BiPAP, chronic afib on Eliquis, remote PE and GERD.  Echocardiogram obtained on 03/15/2019 showed EF 60 to 65%, moderate LAE.  CTA obtained in January 2021 was negative for PE.  He had a heart monitor performed in February 2021 that showed [name] with variable ventricular response, occasional aberrantly conducted beats.  Minimum heart rate 45, maximum heart rate 143, average heart rate 77.  Patient was last seen by Dr. Gwenlyn Found in February, wife called cardiology office recently due to weakness and lightheadedness.  She was instructed to take the patient to the ED however patient refused multiple times.  Patient presents today along with his wife.  He has been having weakness and lightheadedness.  Lightheadedness is likely related to orthostatic dizziness's symptoms typically occur when he change body positions.  At the same time, he also complained of increasing shortness of breath as well.  On physical exam, he has at least 1+ pitting edema on physical exam.  He is on chronic oxygen therapy after the previous pulmonary emboli.  On physical exam, he has diminished breath sound bilaterally with intermittent crackles.  It is unclear to me if the crackles I heard is pulmonary edema versus atelectasis.  I recommended a trial of higher dose of diuretic to see if his breathing will improve.  He is currently on torsemide 20 mg twice daily, I will increase it to 40 mg a.m. and 20 mg p.m.  I also increased his potassium supplement to 20 mEq in the morning and 10 mEq in the afternoon.  Patient is  aware that doing so likely will worsen orthostatic dizzy spell and he needs to be very careful when he tried to get up.  He will need a basic metabolic panel next week prior to follow-up in 1 to 2 weeks.  Since this is a trial on the higher dose of diuretic, patient is aware that if his symptom worsens on higher dose of diuretic, he will need to immediately contact us or cut it back to the previous dose.  Unfortunately patient is chronically ill and it is difficult to identify the cause of his symptoms.  Past Medical History:  Diagnosis Date  . Arthritis   . Atrial fibrillation ([place])   . 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 Diverticula, bladder   . Erectile dysfunction   . GERD (gastroesophageal reflux disease)   . Kidney stones   . Obesity   . Pulmonary embolism [facility])     Past Surgical History:  Procedure Laterality Date  . APPENDECTOMY  1963  . LITHOTRIPSY  1987  . NOSE SURGERY  2003  . TONSILLECTOMY AND ADENOIDECTOMY  1948  . UMBILICAL HERNIA REPAIR  1984    Current Medications: Current Meds  Medication Sig  . Ascorbic Acid (VITAMIN C) 500 MG CAPS Take 1 capsule by mouth daily.  . Atropine Sulfate-NaCl 0.01-0.9 % SOLN Apply 1 drop to eye at bedtime.   . Cholecalciferol (  VITAMIN D3) 3000 units TABS Take by mouth.  . clobetasol (TEMOVATE) 0.05 % external solution APPLY TO AFFECTED AREA(S)  ON SCALP TOPICALLY ONE TO  TWO TIMES A DAY AS NEEDED  . desonide (DESOWEN) 0.05 % lotion Apply topically.  . docusate sodium (COLACE) 100 MG capsule Take 100 mg by mouth daily.   Marland Kitchen ELIQUIS 5 MG TABS tablet TAKE 1 TABLET BY MOUTH  TWICE DAILY  . erythromycin ophthalmic ointment APP THIN LAYER IN OS BID  . magnesium oxide (MAG-OX) 400 MG tablet TAKE 1 TABLET BY MOUTH TWICE A DAY  . MAGNESIUM-OXIDE 400 (241.3 Mg) MG tablet TAKE 1 TABLET BY MOUTH TWICE DAILY  . metoprolol succinate (TOPROL-XL) 25 MG 24 hr tablet TAKE 1 TABLET BY MOUTH  DAILY  . mometasone (NASONEX) 50  MCG/ACT nasal spray Place 2 sprays into the nose daily. (Patient taking differently: Place 2 sprays into the nose as needed. )  . NARCAN 4 MG/0.1ML LIQD nasal spray kit INSTILL ONE SPRAY PRN FOR ACCIDENTAL OVERDOSE  . oxyCODONE (OXY IR/ROXICODONE) 5 MG immediate release tablet Take 5 mg by mouth 2 (two) times daily as needed. for pain  . potassium chloride (MICRO-K) 10 MEQ CR capsule Take 20 meq in the mornings and 10 meq in the evenings  . prednisoLONE acetate (PRED FORTE) 1 % ophthalmic suspension Place 1 drop into the left eye 2 (two) times daily.   . tadalafil (CIALIS) 20 MG tablet Take 0.5-1 tablets (10-20 mg total) by mouth every other day as needed for erectile dysfunction.  . Tafluprost, PF, 0.0015 % SOLN Apply 1 drop to eye daily. Drop 1 drop into right at night  . tamsulosin (FLOMAX) 0.4 MG CAPS capsule TAKE 1 CAPSULE BY MOUTH AT  BEDTIME  . torsemide (DEMADEX) 20 MG tablet Take 40 mg in the mornings and 20 mg in the evenings  . triamcinolone ointment (KENALOG) 0.5 % Apply to affected area 1-2 times daily  . [DISCONTINUED] potassium chloride (MICRO-K) 10 MEQ CR capsule TAKE 1 CAPSULE BY MOUTH  TWICE DAILY  . [DISCONTINUED] torsemide (DEMADEX) 20 MG tablet Take 1 tablet (20 mg total) by mouth 2 (two) times daily.     Allergies:   Other   Social History   Socioeconomic History  . Marital status: Married    Spouse name: Not on file  . Number of children: Not on file  . Years of education: Not on file  . Highest education level: Not on file  Occupational History  . Not on file  Tobacco Use  . Smoking status: Former Smoker    Packs/day: 3.00    Years: 25.00    Pack years: 75.00    Types: Cigarettes    Quit date: 11/28/1984    Years since quitting: 35.3  . Smokeless tobacco: Never Used  Substance and Sexual Activity  . Alcohol use: Never  . Drug use: Never  . Sexual activity: Yes  Other Topics Concern  . 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 Difficulty of Paying Living Expenses:   Food Insecurity:   .  Worried About Charity fundraiser in the Last Year:   . Arboriculturist in the Last Year:   Transportation Needs:   . Film/video editor (Medical):   Marland Kitchen Lack of Transportation (Non-Medical):   Physical Activity:   . Days of Exercise per Week:   . Minutes of Exercise per Session:   Stress:   . Feeling of Stress :   Social Connections:   . Frequency of Communication with Friends and Family:   . Frequency of Social Gatherings with Friends and Family:   . Attends Religious Services:   . Active Member of Clubs or Organizations:   . Attends Archivist Meetings:   Marland Kitchen Marital Status:      Family History: The patient's family history includes Arthritis in his father and mother; Hearing loss in his father and mother; Heart attack in his father; Heart disease in his father and mother; Hypertension in his father and mother; Kidney disease in his father; Stroke in his father and mother.  ROS:   Please see the history of present illness.     All other systems reviewed and are negative.  EKGs/Labs/Other Studies Reviewed:    The following studies were reviewed today:  Echo 03/15/2019 1. The left ventricle has normal systolic function with an ejection  fraction of 60-65%. The cavity size was normal. Left ventricular diastolic  Doppler parameters are indeterminate.  2. The right ventricle has normal systolic function. The cavity was  normal. There is no increase in right ventricular wall thickness.  3. Left atrial size was moderately dilated.  4. Mild thickening of the mitral valve  leaflet. Mild calcification of the  mitral valve leaflet.  5. The aortic valve has an indeterminate number of cusps. Severely  thickening of the aortic valve. Sclerosis without any evidence of stenosis  of the aortic valve.  6. Severe sclerosis of the non and left cusps no no stenosis by CW  doppler.  7. The aortic root is normal in size and structure.   EKG:  EKG is ordered today.  The ekg ordered today demonstrates rate controlled atrial fibrillation  Recent Labs: 12/13/2019: TSH 2.85 12/17/2019: ALT 18; Hemoglobin 14.7; Magnesium 2.0; Platelets 208 12/19/2019: BNP 39.5; BUN 14; Creatinine, Ser 1.11; Potassium 3.8; Sodium 141  Recent Lipid Panel    Component Value Date/Time   CHOL 201 (H) 02/21/2020 1104   CHOL 201 (H) 07/04/2018 1005   TRIG 129.0 02/21/2020 1104   HDL 51.70 02/21/2020 1104   HDL 59 07/04/2018 1005   CHOLHDL 4 02/21/2020 1104   VLDL 25.8 02/21/2020 1104   LDLCALC 124 (H) 02/21/2020 1104   LDLCALC 108 (H) 07/04/2018 1005    Physical Exam:    VS:  BP 135/76   Pulse 88   Temp 97.9 F (36.6 C) Comment: Forehead  Resp 18   Ht '5\' 9"'  (1.753 m)   Wt 282 lb (127.9 kg)   SpO2 99%   BMI 41.64 kg/m     Wt Readings from Last 3 Encounters:  04/03/20 282 lb (127.9 kg)  03/03/20 279 lb 4 oz (126.7 kg)  02/21/20 285 lb 6.4 oz (129.5 kg)     GEN: Chronically ill, sitting in wheelchair on oxygen HEENT: Normal NECK: No JVD; No carotid bruits LYMPHATICS: No lymphadenopathy CARDIAC: Irregularly irregular, no murmurs, rubs, gallops RESPIRATORY: Markedly diminished breath sound bilaterally with intermittent crackles near the base. ABDOMEN: Soft, non-tender, non-distended MUSCULOSKELETAL: 1+ edema; No deformity  SKIN: Warm and dry  NEUROLOGIC:  Alert and oriented x 3 PSYCHIATRIC:  Normal affect   ASSESSMENT:    1. Weakness generalized   2. Medication management   3. Dyspnea, unspecified type   4. Orthostatic dizziness   5. Essential hypertension   6. OSA  treated with BiPAP   7. Chronic atrial fibrillation (HCC)   8. Pulmonary embolism, unspecified chronicity, unspecified pulmonary embolism type, unspecified whether acute cor pulmonale present ([place])    PLAN:    In order of problems listed above:  1. Generalized weakness: Likely multifactorial given obesity, chronic respiratory failure on oxygen, and severe deconditioning  2. Dyspnea: As mentioned above, patient does have at least 1+ pitting edema and diminished breath sounds in bilateral lung.  However his degree of dyspnea with exertion is out of proportion to the cardiac issue.  This is likely multifactorial given deconditioning, mild volume overload, and pulmonary issue with chronic oxygen dependence.  I recommended a trial on the higher dose of diuretic to see if this will help with his symptoms.  I will increase his torsemide to 40 mg a.m. and 20 mg p.m. and also increase potassium to 20 mEq a.m. and 10 mEq p.m.  This is a trial to see if increased diuretic will improve his breathing however I do not think it will completely make his breathing back to normal.  He will need a basic metabolic panel in 1 week and reassessment in 1 to 2 weeks.  I also recommend a repeat echocardiogram  3. Orthostatic dizziness: The dizzy spell he describes is related to body positions.  I recommended him to be very careful with changing the body position.  Compression stocking he is currently wearing will be helpful.  Unfortunately with the higher dose of diuretic, he is aware that it may make his dizzy spell more pronounced therefore he has to be extra careful.  I have very low threshold to reduce his diuretic back to the previous dose.  4. Hypertension: Continue on current therapy  5. Obstructive sleep apnea: On BiPAP therapy  6. Chronic atrial fibrillation: Although recent telephone note to mention possibility of sending the patient to the hospital for cardioversion, however he has been in atrial fibrillation  since at least 2019 and I do not think he will convert back to sinus rhythm easily.  Besides, his heart rate is controlled, I do not think his current symptom is due to atrial fibrillation therefore cardioversion likely will not be helpful  7. History of PE: Unfortunately he has been on oxygen therapy since.   Medication Adjustments/Labs and Tests Ordered: Current medicines are reviewed at length with the patient today.  Concerns regarding medicines are outlined above.  Orders Placed This Encounter  Procedures  . Basic metabolic panel  . EKG 12-Lead  . ECHOCARDIOGRAM COMPLETE   Meds ordered this encounter  Medications  . torsemide (DEMADEX) 20 MG tablet    Sig: Take 40 mg in the mornings and 20 mg in the evenings    Dispense:  180 tablet    Refill:  0    Requesting 1 year supply  . potassium chloride (MICRO-K) 10 MEQ CR capsule    Sig: Take 20 meq in the mornings and 10 meq in the evenings    Dispense:  180 capsule    Refill:  0    Requesting 1 year supply    Patient Instructions  Medication Instructions:   TAKE Torsemide 40 mg in the mornings and 20 mg in the evenings  TAKE  Potassium 20 meq in the mornings and 10 meq in the evenings  *If you need a refill on your cardiac medications before your next appointment, please call your pharmacy*   Lab Work: Your physician recommends that you return for lab work in 1 WEEK:   BMET  If you have labs (blood work) drawn today and your tests are completely normal, you will receive your results only by: Marland Kitchen MyChart Message (if you have MyChart) OR . A paper copy in the mail If you have any lab test that is abnormal or we need to change your treatment, we will call you to review the results.   Testing/Procedures: Your physician has requested that you have an echocardiogram. Echocardiography is a painless test that uses sound waves to create images of your heart. It provides your doctor with information about the size and shape of  your heart and how well your heart's chambers and valves are working. This procedure takes approximately one hour. There are no restrictions for this procedure.   PLEASE SCHEDULE FOR IN 1-2 WEEK on a Monday morning or Friday afternoon   Follow-Up: At [facility], you and your health needs are our priority.  As part of our continuing mission to provide you with exceptional heart care, we have created designated Provider Care Teams.  These Care Teams include your primary Cardiologist (physician) and Advanced Practice Providers (APPs -  Physician Assistants and Nurse Practitioners) who all work together to provide you with the care you need, when you need it.   Your next appointment:   2 week(s)  The format for your next appointment:   In Person  Provider:   Almyra Deforest, PA-C or Roby Lofts, PA-C  Other Instructions      Signed, Almyra Deforest, [place]  04/06/2020 12:03 AM    [place]

## 2020-04-06 ENCOUNTER — Encounter: Payer: Self-pay | Admitting: Physician Assistant

## 2020-04-10 ENCOUNTER — Telehealth: Payer: Self-pay | Admitting: Cardiovascular Disease

## 2020-04-10 NOTE — Progress Notes (Deleted)
Cardiology Office Note   Date:  04/10/2020   ID:  Maurice Reed, DOB Jan 22, 1941, MRN [id number]  PCP:  Inda Coke, PA  Cardiologist:  Quay Burow, MD EP: None  No chief complaint on file.     History of Present Illness: Maurice Reed is a 79 y.o. male with PMH of chronic atrial fibrillation on Eliquis, HTN, HLD, chronic respiratory failure following a PE, OSA on BiPAP, and former tobacco abuse, who presents for ***  He was last evaluated by cardiology at an outpatient visit with Almyra Deforest, PA-C 04/03/20, at which time he had complaints of weakness and lightheadedness. It was felt his lightheadedness was related to orthostatic hypotension. He also had some SOB and was felt to be volume overloaded on exam with 1+ pitting LE edema and bilateral crackle. He was recommended to increase his torsemide from 37m BID to 460mqAM and 2057mPM. He was recommended to undergo echocardiogram which appears to be scheduled for 04/27/20. His last echocardiogram 03/2019 showed EF 60-65%, indeterminate LV diastolic function, moderate LAE, and no significant valvular abnormalities. No prior ischemic evaluation noted in epic, though does have evidence of diffuse aortic and coronary artery atherosclerotic calcifications on a CT scan 12/2019.   1. 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 Chronic atrial fibrillation: HR *** - Continue eliquis for stroke ppx - Continue metoprolol succinate for rate control  3. HTN: BP *** today - Continue metoprolol and torsemide  4. HLD: LDL 124 02/2020; goal <70 given coronary artery calcifications noted on prior CT scan. Not on a statin - Consider starting atorvastatin***  5. OSA: on BiPAP - Continue BiPAP therapy  Past Medical History:  Diagnosis Date  . Arthritis   . 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 Diverticula, bladder   . Erectile dysfunction   . GERD (gastroesophageal reflux  disease)   . Kidney stones   . Obesity   . [facility]   Past Surgical History:  Procedure Laterality Date  . APPENDECTOMY  1963  . LITHOTRIPSY  1987  . NOSE SURGERY  2003  . TONSILLECTOMY AND ADENOIDECTOMY  1948  . UMBILICAL HERNIA REPAIR  1984     Current Outpatient Medications  Medication Sig Dispense Refill  . Ascorbic Acid (VITAMIN C) 500 MG CAPS Take 1 capsule by mouth daily.    . Atropine Sulfate-NaCl 0.01-0.9 % SOLN Apply 1 drop to eye at bedtime.     . Cholecalciferol (VITAMIN D3) 3000 units TABS Take by mouth.    . clobetasol (TEMOVATE) 0.05 % external solution APPLY TO AFFECTED AREA(S)  ON SCALP TOPICALLY ONE TO  TWO TIMES A DAY AS NEEDED 150 mL 4  . desonide (DESOWEN) 0.05 % lotion Apply topically.    . docusate sodium (COLACE) 100 MG capsule Take 100 mg by mouth daily.     . EMarland KitchenIQUIS 5 MG TABS tablet TAKE 1 TABLET BY MOUTH  TWICE DAILY 180 tablet 3  . erythromycin ophthalmic ointment APP THIN LAYER IN OS BID  1  . magnesium oxide (MAG-OX) 400 MG tablet TAKE 1 TABLET BY MOUTH TWICE A DAY 60 tablet 1  . MAGNESIUM-OXIDE 400 (241.3 Mg) MG tablet TAKE 1 TABLET BY MOUTH TWICE DAILY 60 tablet 2  . metoprolol succinate (TOPROL-XL) 25 MG 24 hr tablet TAKE 1 TABLET BY MOUTH  DAILY 90 tablet 3  . mometasone (NASONEX)  50 MCG/ACT nasal spray Place 2 sprays into the nose daily. (Patient taking differently: Place 2 sprays into the nose as needed. ) 51 g 2  . NARCAN 4 MG/0.1ML LIQD nasal spray kit INSTILL ONE SPRAY PRN FOR ACCIDENTAL OVERDOSE  0  . oxyCODONE (OXY IR/ROXICODONE) 5 MG immediate release tablet Take 5 mg by mouth 2 (two) times daily as needed. for pain  0  . potassium chloride (MICRO-K) 10 MEQ CR capsule Take 20 meq in the mornings and 10 meq in the evenings 180 capsule 0  . prednisoLONE acetate (PRED FORTE) 1 % ophthalmic suspension Place 1 drop into the left eye 2 (two) times daily.     . tadalafil (CIALIS) 20 MG tablet Take 0.5-1 tablets (10-20 mg total)  by mouth every other day as needed for erectile dysfunction. 5 tablet 11  . Tafluprost, PF, 0.0015 % SOLN Apply 1 drop to eye daily. Drop 1 drop into right at night    . tamsulosin (FLOMAX) 0.4 MG CAPS capsule TAKE 1 CAPSULE BY MOUTH AT  BEDTIME 90 capsule 1  . torsemide (DEMADEX) 20 MG tablet Take 40 mg in the mornings and 20 mg in the evenings 180 tablet 0  . triamcinolone ointment (KENALOG) 0.5 % Apply to affected area 1-2 times daily 15 g 0   No current facility-administered medications for this visit.    Allergies:   Other    Social History:  The patient  reports that he quit smoking about 35 years ago. His smoking use included cigarettes. He has a 75.00 pack-year smoking history. He has never used smokeless tobacco. He reports that he does not drink alcohol or use drugs.   Family History:  The patient's ***family history includes Arthritis in his father and mother; Hearing loss in his father and mother; Heart attack in his father; Heart disease in his father and mother; Hypertension in his father and mother; Kidney disease in his father; Stroke in his father and mother.    ROS:  Please see the history of present illness.   Otherwise, review of systems are positive for {NONE DEFAULTED:18576::"none"}.   All other systems are reviewed and negative.    PHYSICAL EXAM: VS:  There were no vitals taken for this visit. , BMI There is no height or weight on file to calculate BMI. GEN: Well nourished, well developed, in no acute distress HEENT: normal Neck: no JVD, carotid bruits, or masses Cardiac: ***RRR; no murmurs, rubs, or gallops,no edema  Respiratory:  clear to auscultation bilaterally, normal work of breathing GI: soft, nontender, nondistended, + BS MS: no deformity or atrophy Skin: warm and dry, no rash Neuro:  Strength and sensation are intact Psych: euthymic mood, full affect   EKG:  EKG {ACTION; IS/IS FHL:[id number]} ordered today. The ekg ordered today demonstrates  ***   Recent Labs: 12/13/2019: TSH 2.85 12/17/2019: ALT 18; Hemoglobin 14.7; Magnesium 2.0; Platelets 208 12/19/2019: BNP 39.5; BUN 14; Creatinine, Ser 1.11; Potassium 3.8; Sodium 141    Lipid Panel    Component Value Date/Time   CHOL 201 (H) 02/21/2020 1104   CHOL 201 (H) 07/04/2018 1005   TRIG 129.0 02/21/2020 1104   HDL 51.70 02/21/2020 1104   HDL 59 07/04/2018 1005   CHOLHDL 4 02/21/2020 1104   VLDL 25.8 02/21/2020 1104   LDLCALC 124 (H) 02/21/2020 1104   LDLCALC 108 (H) 07/04/2018 1005      Wt Readings from Last 3 Encounters:  04/03/20 282 lb (127.9 kg)  03/03/20 279 lb  4 oz (126.7 kg)  02/21/20 285 lb 6.4 oz (129.5 kg)      Other studies Reviewed: Additional studies/ records that were reviewed today include:   Echocardiogram 03/2019: 1. The left ventricle has normal systolic function with an ejection  fraction of 60-65%. The cavity size was normal. Left ventricular diastolic  Doppler parameters are indeterminate.  2. The right ventricle has normal systolic function. The cavity was  normal. There is no increase in right ventricular wall thickness.  3. Left atrial size was moderately dilated.  4. Mild thickening of the mitral valve leaflet. Mild calcification of the  mitral valve leaflet.  5. The aortic valve has an indeterminate number of cusps. Severely  thickening of the aortic valve. Sclerosis without any evidence of stenosis  of the aortic valve.  6. Severe sclerosis of the non and left cusps no no stenosis by CW  doppler.  7. The aortic root is normal in size and structure.   Cardiac monitor 01/2020: 1. Afib with variable vent response (slow and fast) 2. Occasional aberrantly conducted beats   ASSESSMENT AND PLAN:  1.  ***   Current medicines are reviewed at length with the patient today.  The patient {ACTIONS; HAS/DOES NOT HAVE:19233} concerns regarding medicines.  The following changes have been made:  {PLAN; NO CHANGE:13088:s}  Labs/ tests  ordered today include: *** No orders of the defined types were placed in this encounter.    Disposition:   FU with *** in {gen number [id number]} {Days to years:10300}  Signed, Abigail Butts, PA-C  04/10/2020 9:15 PM

## 2020-04-10 NOTE — Telephone Encounter (Signed)
Patient's wife wants to know if the appt on 04/13/20 with Judy Pimple should be held off until the patient has his echo on 04/27/20. Please advise.

## 2020-04-10 NOTE — Telephone Encounter (Signed)
Spoke with pts wife. He continues to have dizziness and SOB despite medication changes. I encouraged her to keep his appointment for Monday. She verbalized understanding and had no additional questions.

## 2020-04-11 LAB — BASIC METABOLIC PANEL
BUN/Creatinine Ratio: 14 (ref 10–24)
BUN: 14 mg/dL (ref 8–27)
CO2: 34 mmol/L — ABNORMAL HIGH (ref 20–29)
Calcium: 9.9 mg/dL (ref 8.6–10.2)
Chloride: 96 mmol/L (ref 96–106)
Creatinine, Ser: 1 mg/dL (ref 0.76–1.27)
GFR calc Af Amer: 82 mL/min/{1.73_m2} (ref >59)
GFR calc non Af Amer: 71 mL/min/{1.73_m2} (ref >59)
Glucose: 80 mg/dL (ref 65–99)
Potassium: 4.2 mmol/L (ref 3.5–5.2)
Sodium: 143 mmol/L (ref 134–144)

## 2020-04-13 ENCOUNTER — Ambulatory Visit: Payer: Medicare Other | Admitting: Medical

## 2020-04-16 ENCOUNTER — Encounter: Payer: Self-pay | Admitting: Cardiology

## 2020-04-16 ENCOUNTER — Telehealth (INDEPENDENT_AMBULATORY_CARE_PROVIDER_SITE_OTHER): Payer: Medicare Other | Admitting: Cardiology

## 2020-04-16 VITALS — BP 131/77 | HR 84 | Wt 276.2 lb

## 2020-04-16 DIAGNOSIS — J432 Centrilobular emphysema: Secondary | ICD-10-CM

## 2020-04-16 DIAGNOSIS — I4811 Longstanding persistent atrial fibrillation: Secondary | ICD-10-CM

## 2020-04-16 DIAGNOSIS — I10 Essential (primary) hypertension: Secondary | ICD-10-CM | POA: Diagnosis not present

## 2020-04-16 DIAGNOSIS — Z79899 Other long term (current) drug therapy: Secondary | ICD-10-CM

## 2020-04-16 DIAGNOSIS — Z7901 Long term (current) use of anticoagulants: Secondary | ICD-10-CM

## 2020-04-16 DIAGNOSIS — J9611 Chronic respiratory failure with hypoxia: Secondary | ICD-10-CM

## 2020-04-16 DIAGNOSIS — G4733 Obstructive sleep apnea (adult) (pediatric): Secondary | ICD-10-CM

## 2020-04-16 DIAGNOSIS — Z86711 Personal history of pulmonary embolism: Secondary | ICD-10-CM

## 2020-04-16 NOTE — Progress Notes (Signed)
Virtual Visit via Telephone Note   This visit type was conducted due to national recommendations for restrictions regarding the COVID-19 Pandemic (e.g. social distancing) in an effort to limit this patient's exposure and mitigate transmission in our community.  Due to his co-morbid illnesses, this patient is at least at moderate risk for complications without adequate follow up.  This format is felt to be most appropriate for this patient at this time.  The patient did not have access to video technology/had technical difficulties with video requiring transitioning to audio format only (telephone).  All issues noted in this document were discussed and addressed.  No physical exam could be performed with this format.  Please refer to the patient's chart for his  consent to telehealth for [facility].   The patient was identified using 2 identifiers.  Date:  04/16/2020   ID:  Maurice Reed, DOB [birth year], MRN [id number]  Patient Location: Home Provider Location: Home  PCP:  Inda Coke, [place]  Cardiologist:  Quay Burow, MD  Electrophysiologist:  None   Evaluation Performed:  Follow-Up Visit  Chief Complaint:  Chronic dyspnea- some improvement  History of Present Illness:    Maurice Reed is a 79 y.o. male with a history of hypertension, sleep apnea on BiPAP, chronic atrial fibrillation on Eliquis, and remote pulmonary embolism and pulmonary fibrosis and emphysema followed by Dr. [name]. He was seen in the office 04/03/2020. At that time he was having orthostatic dizziness. He also had increasing lower extremity edema. His torsemide was increased to 40 mg in the morning and 20 mg in the p.m. He was contacted today for follow-up. The patient tells me he thinks he has had some improvement in his edema. His dyspnea is chronic. It does not seem to be worse. Follow-up renal profile on 04/10/2020 on the increased dose of diuretic look good, his renal function and potassium are stable. He has a  follow-up with Dr. All the next month. He has an echocardiogram scheduled for 04/27/2020.  The patient does not have symptoms concerning for COVID-19 infection (fever, chills, cough, or new shortness of breath).    Past Medical History:  Diagnosis Date  . Arthritis   . Atrial fibrillation ([place])   . 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 Diverticula, bladder   . Erectile dysfunction   . GERD (gastroesophageal reflux disease)   . Kidney stones   . Obesity   . Pulmonary embolism [facility])    Past Surgical History:  Procedure Laterality Date  . APPENDECTOMY  1963  . LITHOTRIPSY  1987  . NOSE SURGERY  2003  . TONSILLECTOMY AND ADENOIDECTOMY  1948  . UMBILICAL HERNIA REPAIR  1984     Current Meds  Medication Sig  . Ascorbic Acid (VITAMIN C) 500 MG CAPS Take 1 capsule by mouth daily.  . Atropine Sulfate-NaCl 0.01-0.9 % SOLN Apply 1 drop to eye at bedtime.   . Cholecalciferol (VITAMIN D3) 3000 units TABS Take by mouth.  . clobetasol (TEMOVATE) 0.05 % external solution APPLY TO AFFECTED AREA(S)  ON SCALP TOPICALLY ONE TO  TWO TIMES A DAY AS NEEDED  . desonide (DESOWEN) 0.05 % lotion Apply topically.  . docusate sodium (COLACE) 100 MG capsule Take 100 mg by mouth daily.   Marland Kitchen ELIQUIS 5 MG TABS tablet TAKE 1 TABLET BY MOUTH  TWICE DAILY  . erythromycin ophthalmic ointment APP THIN LAYER IN OS BID  . magnesium oxide (MAG-OX) 400 MG  tablet TAKE 1 TABLET BY MOUTH TWICE A DAY  . metoprolol succinate (TOPROL-XL) 25 MG 24 hr tablet TAKE 1 TABLET BY MOUTH  DAILY  . mometasone (NASONEX) 50 MCG/ACT nasal spray Place 2 sprays into the nose daily. (Patient taking differently: Place 2 sprays into the nose as needed. )  . NARCAN 4 MG/0.1ML LIQD nasal spray kit INSTILL ONE SPRAY PRN FOR ACCIDENTAL OVERDOSE  . oxyCODONE (OXY IR/ROXICODONE) 5 MG immediate release tablet Take 5 mg by mouth 2 (two) times daily as needed. for pain  . potassium chloride (MICRO-K) 10 MEQ CR capsule Take 20 meq  in the mornings and 10 meq in the evenings  . prednisoLONE acetate (PRED FORTE) 1 % ophthalmic suspension Place 1 drop into the left eye 2 (two) times daily.   . tadalafil (CIALIS) 20 MG tablet Take 0.5-1 tablets (10-20 mg total) by mouth every other day as needed for erectile dysfunction.  . Tafluprost, PF, 0.0015 % SOLN Apply 1 drop to eye daily. Drop 1 drop into right at night  . tamsulosin (FLOMAX) 0.4 MG CAPS capsule TAKE 1 CAPSULE BY MOUTH AT  BEDTIME  . torsemide (DEMADEX) 20 MG tablet Take 40 mg in the mornings and 20 mg in the evenings  . triamcinolone ointment (KENALOG) 0.5 % Apply to affected area 1-2 times daily     Allergies:   Other   Social History   Tobacco Use  . Smoking status: Former Smoker    Packs/day: 3.00    Years: 25.00    Pack years: 75.00    Types: Cigarettes    Quit date: 11/28/1984    Years since quitting: 35.4  . Smokeless tobacco: Never Used  Substance Use Topics  . Alcohol use: Never  . Drug use: Never     Family Hx: The patient's family history includes Arthritis in his father and mother; Hearing loss in his father and mother; Heart attack in his father; Heart disease in his father and mother; Hypertension in his father and mother; Kidney disease in his father; Stroke in his father and mother.  ROS:   Please see the history of present illness.    All other systems reviewed and are negative.   Prior CV studies:   The following studies were reviewed today:  Echo 03/14/2020-  IMPRESSIONS    1. The left ventricle has normal systolic function with an ejection  fraction of 60-65%. The cavity size was normal. Left ventricular diastolic  Doppler parameters are indeterminate.  2. The right ventricle has normal systolic function. The cavity was  normal. There is no increase in right ventricular wall thickness.  3. Left atrial size was moderately dilated.  4. Mild thickening of the mitral valve leaflet. Mild calcification of the  mitral valve  leaflet.  5. The aortic valve has an indeterminate number of cusps. Severely  thickening of the aortic valve. Sclerosis without any evidence of stenosis  of the aortic valve.  6. Severe sclerosis of the non and left cusps no no stenosis by CW  doppler.  7. The aortic root is normal in size and structure.   Labs/Other Tests and Data Reviewed:    EKG:  An ECG dated 04/03/2020 was personally reviewed today and demonstrated:  AF with CVR  Recent Labs: 12/13/2019: TSH 2.85 12/17/2019: ALT 18; Hemoglobin 14.7; Magnesium 2.0; Platelets 208 12/19/2019: BNP 39.5 04/10/2020: BUN 14; Creatinine, Ser 1.00; Potassium 4.2; Sodium 143   Recent Lipid Panel Lab Results  Component Value Date/Time  CHOL 201 (H) 02/21/2020 11:04 AM   CHOL 201 (H) 07/04/2018 10:05 AM   TRIG 129.0 02/21/2020 11:04 AM   HDL 51.70 02/21/2020 11:04 AM   HDL 59 07/04/2018 10:05 AM   CHOLHDL 4 02/21/2020 11:04 AM   LDLCALC 124 (H) 02/21/2020 11:04 AM   LDLCALC 108 (H) 07/04/2018 10:05 AM    Wt Readings from Last 3 Encounters:  04/16/20 276 lb 3.2 oz (125.3 kg)  04/03/20 282 lb (127.9 kg)  03/03/20 279 lb 4 oz (126.7 kg)     Objective:    Vital Signs:  BP 131/77   Pulse 84   Wt 276 lb 3.2 oz (125.3 kg)   BMI 40.79 kg/m    VITAL SIGNS:  reviewed  ASSESSMENT & PLAN:    Dyspnea- Chronic- he may have had a component of diastolic CHF.  He did have some improvement with increased diuretic and he did loose weight (6 lbs). His renal profile on the increased diuretic looked good- I did not change this dose. He dose have an echo scheduled later this month.  COPD- Emphysema and pulmonary fibrosis- he will see Dr [name] in June.  HTN- Controlled  H/O PE- CTA in Jan 2021 negative for PE  CAF- Rate controlled- on Eliquis  Orthostatic dizziness-  He is self managing this by getting up carefully- no change in Rx.  Plan: F/U 6-8 weeks.  Plan- Same diuretic dose.  Check BMP at f/u in June.  OV in 6-8 weeks with  cardiology.   COVID-19 Education: The signs and symptoms of COVID-19 were discussed with the patient and how to seek care for testing (follow up with PCP or arrange E-visit).  The importance of social distancing was discussed today.  Time:   Today, I have spent 23 minutes with the patient with telehealth technology discussing the above problems and reviewing the patient's chart.     Medication Adjustments/Labs and Tests Ordered: Current medicines are reviewed at length with the patient today.  Concerns regarding medicines are outlined above.   Tests Ordered: No orders of the defined types were placed in this encounter.   Medication Changes: No orders of the defined types were placed in this encounter.   Follow Up:  In Person with Dr Gwenlyn Found or Almyra Deforest in 6-8 weeks.  Check BMP in June.   Angelena Form, PA-C  04/16/2020 10:30 AM    [place]

## 2020-04-16 NOTE — Patient Instructions (Signed)
Medication Instructions:  The current medical regimen is effective;  continue present plan and medications as directed. Please refer to the Current Medication list given to you today. *If you need a refill on your cardiac medications before your next appointment, please call your pharmacy*  Lab Work: BMET IN June WHILE AT DR [name]'s OFFICE If you have labs (blood work) drawn today and your tests are completely normal, you will receive your results only by:  MyChart Message (if you have MyChart) OR A paper copy in the mail.  If you have any lab test that is abnormal or we need to change your treatment, we will call you to review the results. You may go to any [facility] that is convenient for you however, we do have a lab in our office that is able to assist you. You DO NOT need an appointment for our lab. The lab is open 8:00am and closes at 4:00pm. Lunch 12:45 - 1:45pm.  Follow-Up: Your next appointment:  8 week(s)  In Person with You may see Maurice Batty, MD OR Maurice MENG, PA-C or one of the following Advanced Practice Providers on your designated Care Team:  Maurice Shelter, PA-C  [place], [place]  Maurice Fabian, FNP  At [facility], you and your health needs are our priority.  As part of our continuing mission to provide you with exceptional heart care, we have created designated Provider Care Teams.  These Care Teams include your primary Cardiologist (physician) and Advanced Practice Providers (APPs -  Physician Assistants and Nurse Practitioners) who all work together to provide you with the care you need, when you need it.

## 2020-04-24 ENCOUNTER — Other Ambulatory Visit: Payer: Self-pay | Admitting: Physician Assistant

## 2020-04-26 ENCOUNTER — Other Ambulatory Visit: Payer: Self-pay | Admitting: Physician Assistant

## 2020-04-27 ENCOUNTER — Ambulatory Visit (HOSPITAL_COMMUNITY): Payer: Medicare Other | Attending: Cardiology

## 2020-04-27 ENCOUNTER — Other Ambulatory Visit: Payer: Self-pay

## 2020-04-27 DIAGNOSIS — I082 Rheumatic disorders of both aortic and tricuspid valves: Secondary | ICD-10-CM | POA: Insufficient documentation

## 2020-04-27 DIAGNOSIS — I4891 Unspecified atrial fibrillation: Secondary | ICD-10-CM | POA: Diagnosis not present

## 2020-04-27 DIAGNOSIS — R531 Weakness: Secondary | ICD-10-CM | POA: Insufficient documentation

## 2020-04-27 DIAGNOSIS — Z86711 Personal history of pulmonary embolism: Secondary | ICD-10-CM | POA: Diagnosis not present

## 2020-04-27 DIAGNOSIS — E669 Obesity, unspecified: Secondary | ICD-10-CM | POA: Insufficient documentation

## 2020-04-28 ENCOUNTER — Telehealth: Payer: Self-pay

## 2020-04-28 NOTE — Telephone Encounter (Addendum)
Left a voice message for the patient and sent a MyChart message with the comments from the provider. I will try calling the patient again.  ----- Message from Azalee Course, [name] sent at 04/27/2020  7:32 PM EDT ----- Normal pumping function, moderately elevated right chamber pressure, mild tightness in the aortic valve. Continue current therapy

## 2020-04-29 ENCOUNTER — Other Ambulatory Visit: Payer: Self-pay

## 2020-04-29 DIAGNOSIS — K219 Gastro-esophageal reflux disease without esophagitis: Secondary | ICD-10-CM

## 2020-04-29 MED ORDER — MAGNESIUM OXIDE 400 MG PO TABS: 1.0000 | ORAL_TABLET | Freq: Two times a day (BID) | ORAL | 2 refills | Status: DC

## 2020-04-30 NOTE — Telephone Encounter (Signed)
Rx has been sent to the pharmacy electronically. ° °

## 2020-05-01 ENCOUNTER — Other Ambulatory Visit: Payer: Self-pay | Admitting: Physician Assistant

## 2020-05-18 ENCOUNTER — Encounter: Payer: Self-pay | Admitting: Pulmonary Disease

## 2020-05-18 ENCOUNTER — Ambulatory Visit: Payer: Medicare Other | Admitting: Pulmonary Disease

## 2020-05-18 ENCOUNTER — Other Ambulatory Visit: Payer: Self-pay

## 2020-05-18 VITALS — BP 144/78 | HR 83 | Temp 98.1°F | Ht 70.0 in | Wt 281.0 lb

## 2020-05-18 DIAGNOSIS — J849 Interstitial pulmonary disease, unspecified: Secondary | ICD-10-CM

## 2020-05-18 DIAGNOSIS — J9611 Chronic respiratory failure with hypoxia: Secondary | ICD-10-CM

## 2020-05-18 DIAGNOSIS — G4733 Obstructive sleep apnea (adult) (pediatric): Secondary | ICD-10-CM

## 2020-05-18 DIAGNOSIS — J432 Centrilobular emphysema: Secondary | ICD-10-CM

## 2020-05-18 MED ORDER — IPRATROPIUM-ALBUTEROL 0.5-2.5 (3) MG/3ML IN SOLN: 3.0000 mL | Freq: Four times a day (QID) | RESPIRATORY_TRACT | 3 refills | Status: DC | PRN

## 2020-05-18 NOTE — Progress Notes (Signed)
° °  Subjective:    Patient ID: Maurice Reed, male    DOB: [birth year], 79 y.o.   MRN: [id number]  HPI  78 yo remote smoker forFU ofCOPD, OSA and pulmonary hypertension He has pleural plaques/ fibrothorax consistent withasbestosis but no convincing history of asbestos exposure.  He did work in [employer] all his life.  He has a history of severe pneumonia in 2006 He smoked about 2 packs/day before he quit in his 10s, more than 50 pack years.  PMH -DVT/PE in 2002 , chratrial fibrillation and is maintained on Eliquis, his last flare of CHF was in 2015. 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 Dyspnea is worse, is needing increasing amounts of oxygen, today arrived and 89% on 5 L pulse, uses 3 L continuous oxygen at home. Daughter shows me weight and vital signs record weight seems to have been stable although he has been unable to lose, fluctuates between 273 to 278 pounds, he will take increased Demadex if weight increases by 3 pounds. Compliant with diuretics He had stopped Anoro in the past since this is not helping.  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 Features of asbestosis areNOTidentified.   01/2018 titration >>bipap 18/12   11/2017 spiro ratio 86, FEV 1 45%, FVC 39 % , severe restriction ABG 12/2017 7.42/45/82/ 97% RA  PFT 02/2019 no airway obstruction, ratio 86, FEV1 60%, F VC 50%, no bronchodilator response, TLC 48%, DLCO 66% , corrects for alveolar volume-moderate  intraparenchymal restriction  Echo 07/2018 RVSP 57  09/17/2019-CT chest high-res-calcified pleural plaques bilaterally, compatible with asbestosis related pleural disease, ILD " indeterminate"  for UIP, dilatation of pulmonary trunk concerning for associated pulmonary arterial hypertension  03/15/2019-echocardiogram-LV ejection fraction 60 to 65%, nml RVSF  Review of Systems neg for any significant sore throat, dysphagia, itching, sneezing, nasal congestion or excess/ purulent secretions, fever, chills, sweats, unintended wt loss, pleuritic or exertional cp, hempoptysis, orthopnea pnd or change in chronic leg swelling. Also denies presyncope, palpitations, heartburn, abdominal pain, nausea, vomiting, diarrhea or change in bowel or urinary habits, dysuria,hematuria, rash, arthralgias, visual complaints, headache, numbness weakness or ataxia.     Objective:   Physical Exam   Gen. Pleasant, obese, in no distress ENT - no lesions, no post nasal drip Neck: No JVD, no thyromegaly, no carotid bruits Lungs: no use of accessory muscles, no dullness to percussion, decreased without rales or rhonchi  Cardiovascular: Rhythm regular, heart sounds  normal, no murmurs or gallops, 2+ peripheral edema Musculoskeletal: No deformities, no cyanosis or clubbing , no tremors        Assessment & Plan:

## 2020-05-18 NOTE — Patient Instructions (Signed)
You have pulmonary fibrosis which seems to be progressive.  We discussed 2 medications for those conditions called Esbriet and O FEV  Continue oxygen in the daytime. We will call in duo nebs every 6 hours as needed to use her nebulizer for increasing shortness of breath #30.  Try to stay active

## 2020-05-18 NOTE — Assessment & Plan Note (Signed)
Attributed to asbestos and cotton dust exposure -I am not convinced he has a significant asbestos exposure "Indeterminate" for UIP He does seem to have a progressive phenotype I offered him antifibrotic medications today.  We had shared decision making, detailed discussion regarding side effects of these medications and efficacy. He is not interested, he is more anxious about his quality of his remaining life I gave him handouts about these medications to think about it

## 2020-05-18 NOTE — Assessment & Plan Note (Signed)
Very compliant and this is certainly helping his daytime somnolence and fatigue Weight loss encouraged, compliance with goal of at least 4-6 hrs every night is the expectation. Advised against medications with sedative side effects Cautioned against driving when sleepy - understanding that sleepiness will vary on a day to day basis

## 2020-05-18 NOTE — Assessment & Plan Note (Addendum)
Hypoxia appears to be worsening He will continue using 3 to 5 L pulse in 3 L continuous when he is at home. I filled out request for handicap parking Would like him to participate in pulmonary rehab but he does not drive and his daughter cannot take him to [facility] which is a 20-minute drive for him

## 2020-05-19 LAB — COLOGUARD: Cologuard: NEGATIVE

## 2020-05-21 ENCOUNTER — Encounter: Payer: Self-pay | Admitting: Physician Assistant

## 2020-05-21 ENCOUNTER — Telehealth: Payer: Self-pay | Admitting: *Deleted

## 2020-05-21 NOTE — Telephone Encounter (Signed)
Left message on voicemail to call office. Cologuard test was Negative for Colon cancer.

## 2020-05-21 NOTE — Telephone Encounter (Signed)
Pt called back told him Cologuard screening for colon cancer was Negative. Pt verbalized understanding.

## 2020-05-23 LAB — BASIC METABOLIC PANEL
BUN/Creatinine Ratio: 12 (ref 10–24)
BUN: 11 mg/dL (ref 8–27)
CO2: 28 mmol/L (ref 20–29)
Calcium: 9.5 mg/dL (ref 8.6–10.2)
Chloride: 95 mmol/L — ABNORMAL LOW (ref 96–106)
Creatinine, Ser: 0.91 mg/dL (ref 0.76–1.27)
GFR calc Af Amer: 92 mL/min/{1.73_m2} (ref >59)
GFR calc non Af Amer: 80 mL/min/{1.73_m2} (ref >59)
Glucose: 101 mg/dL — ABNORMAL HIGH (ref 65–99)
Potassium: 4 mmol/L (ref 3.5–5.2)
Sodium: 143 mmol/L (ref 134–144)

## 2020-05-29 ENCOUNTER — Other Ambulatory Visit: Payer: Self-pay

## 2020-05-29 ENCOUNTER — Encounter: Payer: Self-pay | Admitting: Cardiovascular Disease

## 2020-05-29 ENCOUNTER — Ambulatory Visit: Payer: Medicare Other | Admitting: Cardiovascular Disease

## 2020-05-29 DIAGNOSIS — I10 Essential (primary) hypertension: Secondary | ICD-10-CM

## 2020-05-29 DIAGNOSIS — R600 Localized edema: Secondary | ICD-10-CM | POA: Diagnosis not present

## 2020-05-29 MED ORDER — TORSEMIDE 20 MG PO TABS: ORAL_TABLET | ORAL | 3 refills | Status: DC

## 2020-05-29 NOTE — Progress Notes (Signed)
05/29/2020 Maurice Reed   [birth year]  [id number]  Primary Physician Inda Coke, [place] Primary Cardiologist: Lorretta Harp MD Lupe Carney, [name]  HPI:  Maurice Reed is a 79 y.o.  moderately overweight married Caucasian male (wife is Maurice Reed), father of 92, grandfather 69 grandchildren who is retired from working in the [employer]. He was referred by Inda Coke, PA-C to be established in our practice because of prior cardiac history.  I last saw him in the office 01/28/2020. He has a greater than 100-pack-year history tobacco abuse having quit in 1985 and smoked 3 packs a day at that time. He does have treated hypertension, obstructive sleep apnea on BiPAP. He does wear oxygen nightly. He has chronic [name] and a history of remote PE on Eliquis oral anticoagulation. He is never had a heart attack or stroke. He has chronic shortness of breath but denies chest pain.  Since I saw him in the office 4 months ago he has remained stable.  He has seen Almyra Deforest and Kerin Ransom, [place] in the office in follow-up.  We have been adjusting his diuretics for chronic lower extremity edema probably related to diastolic dysfunction.  Is now on torsemide 40 mg in the morning and 20 in the evening with as needed torsemide for weight gain.  Recent lab work performed a week ago was acceptable.  He did have an echo performed 04/27/2020 revealing normal LV systolic function, moderate concentric LVH with mild aortic stenosis.  He has persistent [name] on Eliquis oral anticoagulation.   Current Meds  Medication Sig  . Ascorbic Acid (VITAMIN C) 500 MG CAPS Take 1 capsule by mouth daily.  . Atropine Sulfate-NaCl 0.01-0.9 % SOLN Apply 1 drop to eye at bedtime.   . Cholecalciferol (VITAMIN D3) 3000 units TABS Take by mouth.  . clobetasol (TEMOVATE) 0.05 % external solution APPLY TO AFFECTED AREA(S)  ON SCALP TOPICALLY ONE TO  TWO TIMES A DAY AS NEEDED  . desonide (DESOWEN) 0.05 % lotion Apply  topically.  . docusate sodium (COLACE) 100 MG capsule Take 100 mg by mouth daily.   Maurice Reed ELIQUIS 5 MG TABS tablet TAKE 1 TABLET BY MOUTH  TWICE DAILY  . erythromycin ophthalmic ointment APP THIN LAYER IN OS BID  . ipratropium-albuterol (DUONEB) 0.5-2.5 (3) MG/3ML SOLN Take 3 mLs by nebulization every 6 (six) hours as needed.  . magnesium oxide (MAG-OX) 400 MG tablet Take 1 tablet (400 mg total) by mouth 2 (two) times daily.  . metoprolol succinate (TOPROL-XL) 25 MG 24 hr tablet TAKE 1 TABLET BY MOUTH  DAILY  . mometasone (NASONEX) 50 MCG/ACT nasal spray Place 2 sprays into the nose daily. (Patient taking differently: Place 2 sprays into the nose as needed. )  . NARCAN 4 MG/0.1ML LIQD nasal spray kit INSTILL ONE SPRAY PRN FOR ACCIDENTAL OVERDOSE  . oxyCODONE (OXY IR/ROXICODONE) 5 MG immediate release tablet Take 5 mg by mouth 2 (two) times daily as needed. for pain  . potassium chloride (MICRO-K) 10 MEQ CR capsule TAKE 2 CAPSULES BY MOUTH IN THE AM AND 1 CAPSULE IN THE EVENING  . prednisoLONE acetate (PRED FORTE) 1 % ophthalmic suspension Place 1 drop into the left eye 2 (two) times daily.   . tadalafil (CIALIS) 20 MG tablet Take 0.5-1 tablets (10-20 mg total) by mouth every other day as needed for erectile dysfunction.  . Tafluprost, PF, 0.0015 % SOLN Apply 1 drop to eye daily. Drop 1 drop into  right at night  . tamsulosin (FLOMAX) 0.4 MG CAPS capsule TAKE 1 CAPSULE BY MOUTH AT  BEDTIME  . torsemide (DEMADEX) 20 MG tablet TAKE 2 TABLETS BY MOUTH IN THE AM AND 1 TAB IN THE EVENING  . triamcinolone ointment (KENALOG) 0.5 % APPLY TO AFFECTED AREA(S)  TOPICALLY 1 TO 2 TIMES  DAILY     Allergies  Allergen Reactions  . Other Itching    Cats: watery eyes, itching, sneezing    Social History   Socioeconomic History  . Marital status: Married    Spouse name: Not on file  . Number of children: Not on file  . Years of education: Not on file  . Highest education level: Not on file  Occupational  History  . Not on file  Tobacco Use  . Smoking status: Former Smoker    Packs/day: 3.00    Years: 25.00    Pack years: 75.00    Types: Cigarettes    Quit date: 11/28/1984    Years since quitting: 35.5  . Smokeless tobacco: Never Used  Vaping Use  . Vaping Use: Never used  Substance and Sexual Activity  . Alcohol use: Never  . Drug use: Never  . Sexual activity: Yes  Other Topics Concern  . 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 Difficulty of Paying Living Expenses:   Food Insecurity:   . Worried About Charity fundraiser in the Last Year:   . Arboriculturist in the Last Year:   Transportation Needs:   . Film/video editor (Medical):   Maurice Reed Lack of Transportation (Non-Medical):   Physical Activity:   . Days of Exercise per Week:   . Minutes of Exercise per Session:   Stress:   . Feeling of Stress :   Social Connections:   . Frequency of Communication with Friends and Family:   . Frequency of Social Gatherings with Friends and Family:   . Attends Religious Services:   . Active Member of Clubs or Organizations:   . Attends Archivist Meetings:   Maurice Reed Marital Status:   Intimate Partner Violence:   . Fear of Current or Ex-Partner:   . Emotionally Abused:   Maurice Reed Physically Abused:   . Sexually Abused:      Review of Systems: General: negative for chills, fever, night sweats or weight changes.  Cardiovascular: negative for chest pain, dyspnea on exertion, edema, orthopnea, palpitations, paroxysmal nocturnal dyspnea or shortness of breath Dermatological: negative for rash Respiratory: negative for cough or  wheezing Urologic: negative for hematuria Abdominal: negative for nausea, vomiting, diarrhea, bright red blood per rectum, melena, or hematemesis Neurologic: negative for visual changes, syncope, or dizziness All other systems reviewed and are otherwise negative except as noted above.    Blood pressure (!) 150/70, pulse 96, height '5\' 10"'  (1.778 m), weight 282 lb (127.9 kg).  General appearance: alert and no distress Neck: no adenopathy, no carotid bruit, no JVD, supple, symmetrical, trachea midline and thyroid not enlarged, symmetric, no tenderness/mass/nodules Lungs: clear to auscultation bilaterally Heart: irregularly irregular rhythm  and Soft outflow tract murmur Extremities: 1+ edema bilaterally Pulses: 2+ and symmetric Skin: Skin color, texture, turgor normal. No rashes or lesions Neurologic: Alert and oriented X 3, normal strength and tone. Normal symmetric reflexes. Normal coordination and gait  EKG not performed today  ASSESSMENT AND PLAN:   Essential hypertension History of essential hypertension blood pressure measured today 150/70.  He is on metoprolol.  Atrial fibrillation ([place]) History of persistent [name] rate controlled on Eliquis oral anticoagulation.  Bilateral lower extremity edema History of bilateral lower extremity edema probably from diastolic dysfunction on high-dose torsemide which has markedly improved his edema.  He is aware of salt restriction.  His renal function and potassium are stable.      Lorretta Harp MD FACP,FACC,FAHA, [facility] 05/29/2020 3:59 PM

## 2020-05-29 NOTE — Patient Instructions (Signed)
Medication Instructions:  Your Physician recommend you continue on your current medication as directed.    *If you need a refill on your cardiac medications before your next appointment, please call your pharmacy*   Lab Work: None  Testing/Procedures: None   Follow-Up: At [facility], you and your health needs are our priority.  As part of our continuing mission to provide you with exceptional heart care, we have created designated Provider Care Teams.  These Care Teams include your primary Cardiologist (physician) and Advanced Practice Providers (APPs -  Physician Assistants and Nurse Practitioners) who all work together to provide you with the care you need, when you need it.  We recommend signing up for the patient portal called "MyChart".  Sign up information is provided on this After Visit Summary.  MyChart is used to connect with patients for Virtual Visits (Telemedicine).  Patients are able to view lab/test results, encounter notes, upcoming appointments, etc.  Non-urgent messages can be sent to your provider as well.   To learn more about what you can do with MyChart, go to [email or web address].    Your next appointment:   1 year(s)  The format for your next appointment:   In Person  Provider:   Jonathan Berry, MD  Your physician recommends that you schedule a follow-up appointment in 3 months with Luke Kilroy, PA  

## 2020-05-29 NOTE — Assessment & Plan Note (Signed)
History of persistent A-fib rate controlled on Eliquis oral anticoagulation. 

## 2020-05-29 NOTE — Assessment & Plan Note (Signed)
History of essential hypertension blood pressure measured today 150/70.  He is on metoprolol.

## 2020-05-29 NOTE — Assessment & Plan Note (Signed)
History of bilateral lower extremity edema probably from diastolic dysfunction on high-dose torsemide which has markedly improved his edema.  He is aware of salt restriction.  His renal function and potassium are stable.

## 2020-07-10 ENCOUNTER — Telehealth: Payer: Medicare Other | Admitting: Family Medicine

## 2020-07-10 ENCOUNTER — Telehealth (INDEPENDENT_AMBULATORY_CARE_PROVIDER_SITE_OTHER): Payer: Medicare Other | Admitting: Family Medicine

## 2020-07-10 DIAGNOSIS — L0390 Cellulitis, unspecified: Secondary | ICD-10-CM

## 2020-07-10 MED ORDER — DOXYCYCLINE HYCLATE 100 MG PO TABS
100.0000 mg | ORAL_TABLET | Freq: Two times a day (BID) | ORAL | 0 refills | Status: DC
Start: 2020-07-10 — End: 2020-08-31

## 2020-07-10 NOTE — Progress Notes (Signed)
   Maurice Reed is a 79 y.o. male who presents today for a virtual office visit.  Assessment/Plan:  New/Acute Problems: Cellulitis Patient was able to upload picture on my chart under his wife's account.  Was able to view this today.  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  Will start doxycycline 100 mg twice daily.  Discussed reasons to return to care or seek emergent care.    Subjective:  HPI:  Patient here for virtual visit with his wife.  Has had painful rash on his back for the past 10 days or so.  Symptoms are worsening.  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  Has been scratching at the area.  Has had some low-grade chills.  No specific treatments tried.  Wife is worried about inflammation or infection.       Objective/Observations  Physical Exam: Gen: NAD, resting comfortably Pulm: Normal work of breathing Neuro: Grossly normal, moves all extremities Psych: Normal affect and thought content  Virtual Visit via Video   I connected with Cheryl Flash on 07/10/20 at  4:00 PM EDT by a video enabled telemedicine application and verified that I am speaking with the correct person using two identifiers. The limitations of evaluation and management by telemedicine and the availability of in person appointments were discussed. The patient expressed understanding and agreed to proceed.   Patient location: Home Provider location: [place] [facility] [employer] Persons participating in the virtual visit: Myself and Patient     Katina Degree. Jimmey Ralph, MD 07/10/2020 3:52 PM

## 2020-07-13 ENCOUNTER — Ambulatory Visit: Payer: Medicare Other | Admitting: Physician Assistant

## 2020-07-13 ENCOUNTER — Telehealth: Payer: Self-pay | Admitting: Physician Assistant

## 2020-07-13 ENCOUNTER — Other Ambulatory Visit: Payer: Self-pay | Admitting: Physician Assistant

## 2020-07-13 NOTE — Telephone Encounter (Signed)
Please call and see if [facility] is willing to see patient today?  [name]

## 2020-07-13 NOTE — Telephone Encounter (Signed)
Please call patient and let her know that we have called [facility] and they do not have any appointments today.  I was told that she had a possible appointment for tomorrow that was referred to by the urgent care? I recommend that she keep this appointment for her husband. If worsening in the meantime, needs to go back to the Urgent Care vs ER.

## 2020-07-13 NOTE — Telephone Encounter (Signed)
Please advise 

## 2020-07-13 NOTE — Telephone Encounter (Signed)
[facility] does not have any open appointments today. LVM with New Patient Referral Coordinator to give me a call back for possible future appt.

## 2020-07-13 NOTE — Telephone Encounter (Signed)
Called patient reviewed information with wife and patient. Had repeat back to me. States that they will keep appointment in [place] will call if any questions on issues

## 2020-07-13 NOTE — Telephone Encounter (Signed)
Pt wife called stating pt went to urgent care for the spot on his back and it was diagnosed as an abscess. Wife states it is draining and the provider at the urgent care told them the abscess needs to be lanced. Pt wife is asking if pt can be referred to have this done. Wife wants the patient to get in TODAY to get abscess removed. Wife is asking for someone to give her a call back. Please advise.

## 2020-07-14 ENCOUNTER — Telehealth: Payer: Self-pay | Admitting: Physician Assistant

## 2020-07-14 DIAGNOSIS — K219 Gastro-esophageal reflux disease without esophagitis: Secondary | ICD-10-CM

## 2020-07-14 MED ORDER — MAGNESIUM OXIDE 400 MG PO TABS: 1.0000 | ORAL_TABLET | Freq: Two times a day (BID) | ORAL | 2 refills | Status: DC

## 2020-07-14 NOTE — Progress Notes (Signed)
Erroneous encounter.  Katina Degree. Jimmey Ralph, MD 07/14/2020 2:28 PM

## 2020-07-14 NOTE — Telephone Encounter (Signed)
Medication sent to pharmacy  

## 2020-07-14 NOTE — Telephone Encounter (Signed)
MEDICATION: magnesium oxide (MAG-OX) 400 MG tablet  PHARMACY: [id number] - [place], [place] - [address] Phone:  [phone]  Fax:  [phone]       Comments: Patient would like enough to make it through a month since he his taking it twice a day. Preferable make it for 90 days if possible   **Let patient know to contact pharmacy at the end of the day to make sure medication is ready. **  ** Please notify patient to allow 48-72 hours to process**  **Encourage patient to contact the pharmacy for refills or they can request refills through [facility]**

## 2020-07-22 ENCOUNTER — Ambulatory Visit (INDEPENDENT_AMBULATORY_CARE_PROVIDER_SITE_OTHER): Payer: Medicare Other | Admitting: Physician Assistant

## 2020-07-22 ENCOUNTER — Encounter: Payer: Self-pay | Admitting: Physician Assistant

## 2020-07-22 ENCOUNTER — Other Ambulatory Visit: Payer: Self-pay

## 2020-07-22 VITALS — BP 126/70 | HR 95 | Temp 98.6°F | Ht 70.0 in | Wt 278.5 lb

## 2020-07-22 DIAGNOSIS — L0291 Cutaneous abscess, unspecified: Secondary | ICD-10-CM | POA: Diagnosis not present

## 2020-07-22 DIAGNOSIS — R29898 Other symptoms and signs involving the musculoskeletal system: Secondary | ICD-10-CM

## 2020-07-22 NOTE — Progress Notes (Addendum)
Maurice Reed is a 79 y.o. male is here for follow up.  I acted as a Education administrator for [employer], PA-C Anselmo Pickler, LPN   History of Present Illness:   Chief Complaint  Patient presents with  . Wound Check    HPI   Patient's wife, Maurice Reed, present for visit.  Wound check Pt had an I&D done on 08/9 on his lower back by Dr. Noberto Retort, he was referred to this surgeon after being seen at urgent care the day before. When patient presented to the urgent care he had some spontaneous drainage. Wife reports that the discharge was sent off and was positive for "staph". Started taking oral doxycycline on Friday 8/6 that was prescribed by my colleague, Dr. Dimas Chyle. Overall he has been doing well. Area is becoming significantly less painful. Wife is doing bandage changes twice a day. The area that has contact with the bandages is getting inflamed and quite irritated.  Denies: fevers, chills, malaise, worsening redness  Lower leg weakness/numbness  Patient reports that he has been having increased issues with his legs. He feels like his legs are going to give out on him. There are times when they feel numb. He has had two falls -- denies hitting head or LOC. Denies new incontinence, fever, chills, tingling. He can walk with a walker at home. Due to the wound on his back, he is having trouble doing his usual LE exercises (uses a foot pedal exerciser). Has a walker that he uses at home. Symptoms have been going on for a least a month.    Health Maintenance Due  Topic Date Due  . Hepatitis C Screening  Never done  . INFLUENZA VACCINE  07/05/2020    Past Medical History:  Diagnosis Date  . Arthritis   . Atrial fibrillation ([place])   . 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 Diverticula, bladder   . Erectile dysfunction   . GERD (gastroesophageal reflux disease)   . Kidney stones   . Obesity   . Pulmonary embolism ([facility])      Social History   Tobacco Use  .  Smoking status: Former Smoker    Packs/day: 3.00    Years: 25.00    Pack years: 75.00    Types: Cigarettes    Quit date: 11/28/1984    Years since quitting: 35.6  . Smokeless tobacco: Never Used  Vaping Use  . Vaping Use: Never used  Substance Use Topics  . Alcohol use: Never  . Drug use: Never    Past Surgical History:  Procedure Laterality Date  . APPENDECTOMY  1963  . LITHOTRIPSY  1987  . NOSE SURGERY  2003  . TONSILLECTOMY AND ADENOIDECTOMY  1948  . UMBILICAL HERNIA REPAIR  1984    Family History  Problem Relation Age of Onset  . Arthritis Mother   . Hearing loss Mother   . Hypertension Mother   . Heart disease Mother   . Stroke Mother   . Arthritis Father   . Hearing loss Father   . Heart disease Father   . Hypertension Father   . Heart attack Father   . Kidney disease Father   . Stroke Father     PMHx, SurgHx, SocialHx, FamHx, Medications, and Allergies were reviewed in the Visit Navigator and updated as appropriate.   Patient Active Problem List   Diagnosis Date Noted  . Chronic anticoagulation 04/16/2020  . Chronic constipation 03/03/2020  .  Bilateral lower abdominal discomfort 03/03/2020  . Chronic respiratory failure with hypoxia ([place]) 12/23/2019  . Shortness of breath 12/23/2019  . Healthcare maintenance 08/16/2019  . 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 Bilateral lower extremity edema 07/04/2018  . Sclerosis of the skin 06/26/2018  . ILD (interstitial lung disease) ([place]) 05/22/2018  . Insomnia 05/22/2018  . Erectile dysfunction 05/22/2018  . Obesity   . Kidney stones   . Essential hypertension   . GERD (gastroesophageal reflux disease)   . Former smoker   . Arthritis   . 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 OSA treated with BiPAP 06/17/2013  . 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 Atrial fibrillation ([place]) 06/17/2013  . Rosacea 06/17/2013  . 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 History of pulmonary embolus (PE) 12/05/1998    Social History   Tobacco Use  . Smoking status: Former Smoker    Packs/day: 3.00    Years: 25.00    Pack years: 75.00    Types: Cigarettes    Quit date: 11/28/1984    Years since quitting: 35.6  . Smokeless tobacco: Never Used  Vaping Use  . Vaping Use: Never used  Substance Use Topics  . Alcohol use: Never  . Drug use: Never    Current Medications and Allergies:    Current Outpatient Medications:  .  Ascorbic Acid (VITAMIN C) 500 MG CAPS, Take 1 capsule by mouth daily., Disp: , Rfl:  .  Atropine Sulfate-NaCl 0.01-0.9 % SOLN, Apply 1 drop to eye at bedtime. , Disp: , Rfl:  .  Cholecalciferol (VITAMIN D3) 3000 units TABS, Take by mouth., Disp: , Rfl:  .  clobetasol (TEMOVATE) 0.05 % external solution, APPLY TO AFFECTED AREA(S)  ON SCALP TOPICALLY ONE TO  TWO TIMES A DAY AS NEEDED, Disp: 150 mL, Rfl: 4 .  desonide (DESOWEN) 0.05 % lotion, Apply topically., Disp: , Rfl:  .  docusate sodium (COLACE) 100 MG capsule, Take 100 mg by mouth daily. , Disp: , Rfl:  .  doxycycline (VIBRA-TABS) 100 MG tablet, Take 1 tablet (100 mg total) by mouth 2 (two) times daily., Disp: 28 tablet, Rfl: 0 .  ELIQUIS 5 MG TABS tablet, TAKE 1 TABLET BY MOUTH  TWICE DAILY, Disp: 180 tablet, Rfl: 3 .  erythromycin ophthalmic ointment, APP THIN LAYER IN OS BID, Disp: , Rfl: 1 .  ipratropium-albuterol (DUONEB) 0.5-2.5 (3) MG/3ML SOLN, Take 3 mLs by nebulization every 6 (six) hours as needed., Disp: 360 mL, Rfl: 3 .  magnesium oxide (MAG-OX) 400 MG tablet, Take 1 tablet (400 mg total) by mouth 2 (two) times daily., Disp: 60 tablet, Rfl: 2 .  metoprolol succinate (TOPROL-XL) 25 MG 24 hr tablet, TAKE 1 TABLET BY MOUTH  DAILY, Disp: 90 tablet, Rfl: 3 .  mometasone (NASONEX) 50 MCG/ACT nasal spray,  Place 2 sprays into the nose daily. (Patient taking differently: Place 2 sprays into the nose as needed. ), Disp: 51 g, Rfl: 2 .  NARCAN 4 MG/0.1ML LIQD nasal spray kit, INSTILL ONE SPRAY PRN FOR ACCIDENTAL OVERDOSE, Disp: , Rfl: 0 .  oxyCODONE (OXY IR/ROXICODONE) 5 MG immediate release tablet, Take 5 mg by mouth 2 (two) times daily as needed. for pain, Disp: , Rfl: 0 .  potassium chloride (MICRO-K) 10 MEQ CR capsule, TAKE 2 CAPSULES BY MOUTH IN THE  AM AND 1 CAPSULE IN THE EVENING, Disp: 90 capsule, Rfl: 6 .  prednisoLONE acetate (PRED FORTE) 1 % ophthalmic suspension, Place 1 drop into the left eye 2 (two) times daily. , Disp: , Rfl:  .  tadalafil (CIALIS) 20 MG tablet, Take 0.5-1 tablets (10-20 mg total) by mouth every other day as needed for erectile dysfunction., Disp: 5 tablet, Rfl: 11 .  Tafluprost, PF, 0.0015 % SOLN, Apply 1 drop to eye daily. Drop 1 drop into right at night, Disp: , Rfl:  .  tamsulosin (FLOMAX) 0.4 MG CAPS capsule, TAKE 1 CAPSULE BY MOUTH AT  BEDTIME, Disp: 90 capsule, Rfl: 3 .  temazepam (RESTORIL) 15 MG capsule, Take 15 mg by mouth at bedtime as needed., Disp: , Rfl:  .  torsemide (DEMADEX) 20 MG tablet, TAKE 2 TABLETS BY MOUTH IN THE AM AND 1 TAB IN THE EVENING, Disp: 270 tablet, Rfl: 3 .  triamcinolone ointment (KENALOG) 0.5 %, APPLY TO AFFECTED AREA(S)  TOPICALLY 1 TO 2 TIMES  DAILY, Disp: 45 g, Rfl: 2   Allergies  Allergen Reactions  . Other Itching    Cats: watery eyes, itching, sneezing    Review of Systems   ROS  Negative unless otherwise specified per HPI.  Vitals:   Vitals:   07/22/20 1108  BP: 126/70  Pulse: 95  Temp: 98.6 F (37 C)  TempSrc: Temporal  SpO2: 98%  Weight: 278 lb 8 oz (126.3 kg)  Height: '5\' 10"'  (1.778 m)     Body mass index is 39.96 kg/m.   Physical Exam:    Physical Exam Vitals and nursing note reviewed.  Constitutional:      Appearance: He is well-developed.  HENT:     Head: Normocephalic.  Eyes:      Conjunctiva/sclera: Conjunctivae normal.     Pupils: Pupils are equal, round, and reactive to light.  Cardiovascular:     Rate and Rhythm: Normal rate. Rhythm irregularly irregular.     Comments: Bilateral compression stockings Pulmonary:     Effort: Pulmonary effort is normal.     Breath sounds: Normal breath sounds.     Comments: On 3 L of oxygen [place] Musculoskeletal:        General: Normal range of motion.     Cervical back: Normal range of motion.  Skin:    General: Skin is warm and dry.     Comments: Lower back wound with approx 54m circular hole with packing without drainage; approximate 3 cm surrounding erythema to wound without significant warmth appreciated  Area of very superficial erythema in the location where tape was removed  Neurological:     Mental Status: He is alert and oriented to person, place, and time.  Psychiatric:        Behavior: Behavior normal.        Thought Content: Thought content normal.        Judgment: Judgment normal.      Assessment and Plan:    JKhriswas seen today for wound check.  Diagnoses and all orders for this visit:  Weakness of both lower extremities Unclear etiology. He's quite debilitated at baseline but wife is concerned that he is rapidly worsening. Will obtain labs -- if no obvious cause of symptoms, will order stat MRI of total spine. Referral to neurology or other appropriate specialist as indicated. Worsening issues -- needs to go to the ER. -     CBC with Differential/Platelet; Future -     Comprehensive metabolic  panel; Future -     Hemoglobin A1c; Future -     Vitamin B12; Future -     TSH; Future -     TSH -     Vitamin B12 -     Hemoglobin A1c -     Comprehensive metabolic panel -     CBC with Differential/Platelet  Abscess No red flags on exam. Area appears to be healing quite well. Provided reassurance. Did provide less adhesive (but still sufficient) wound care materials to hopefully help decrease the  surrounding skin irritation that he is getting from the tape and wound care currently being provided by wife. Continue oral doxycycline as initially prescribed. Patient and wife are already well versed in red flags. Follow-up with surgeon as scheduled, sooner if concerns.  . Reviewed expectations re: course of current medical issues. . Discussed self-management of symptoms. . Outlined signs and symptoms indicating need for more acute intervention. . Patient verbalized understanding and all questions were answered. . See orders for this visit as documented in the electronic medical record. . Patient received an After Visit Summary.  CMA or LPN served as scribe during this visit. History, Physical, and Plan performed by medical provider. The above documentation has been reviewed and is accurate and complete.  Inda Coke, PA-C [place], [place] 07/22/2020  Follow-up: No follow-ups on file.

## 2020-07-22 NOTE — Patient Instructions (Addendum)
It was great to see you!  Your wound looks great. Continue what you are doing today.  Labs today and likely urgent referral to neurology.  Take care,  Jarold Motto PA-C

## 2020-07-23 LAB — COMPREHENSIVE METABOLIC PANEL
AG Ratio: 1.3 (calc) (ref 1.0–2.5)
ALT: 16 U/L (ref 9–46)
AST: 19 U/L (ref 10–35)
Albumin: 3.8 g/dL (ref 3.6–5.1)
Alkaline phosphatase (APISO): 67 U/L (ref 35–144)
BUN: 16 mg/dL (ref 7–25)
CO2: 35 mmol/L — ABNORMAL HIGH (ref 20–32)
Calcium: 9.5 mg/dL (ref 8.6–10.3)
Chloride: 98 mmol/L (ref 98–110)
Creat: 0.9 mg/dL (ref 0.70–1.18)
Globulin: 2.9 g/dL (calc) (ref 1.9–3.7)
Glucose, Bld: 102 mg/dL — ABNORMAL HIGH (ref 65–99)
Potassium: 4.2 mmol/L (ref 3.5–5.3)
Sodium: 141 mmol/L (ref 135–146)
Total Bilirubin: 0.6 mg/dL (ref 0.2–1.2)
Total Protein: 6.7 g/dL (ref 6.1–8.1)

## 2020-07-23 LAB — CBC WITH DIFFERENTIAL/PLATELET
Absolute Monocytes: 578 cells/uL (ref 200–950)
Basophils Absolute: 20 cells/uL (ref 0–200)
Basophils Relative: 0.3 %
Eosinophils Absolute: 88 cells/uL (ref 15–500)
Eosinophils Relative: 1.3 %
HCT: 41.5 % (ref 38.5–50.0)
Hemoglobin: 13.9 g/dL (ref 13.2–17.1)
Lymphs Abs: 1618 cells/uL (ref 850–3900)
MCH: 34.5 pg — ABNORMAL HIGH (ref 27.0–33.0)
MCHC: 33.5 g/dL (ref 32.0–36.0)
MCV: 103 fL — ABNORMAL HIGH (ref 80.0–100.0)
MPV: 9.9 fL (ref 7.5–12.5)
Monocytes Relative: 8.5 %
Neutro Abs: 4495 cells/uL (ref 1500–7800)
Neutrophils Relative %: 66.1 %
Platelets: 277 10*3/uL (ref 140–400)
RBC: 4.03 10*6/uL — ABNORMAL LOW (ref 4.20–5.80)
RDW: 12.1 % (ref 11.0–15.0)
Total Lymphocyte: 23.8 %
WBC: 6.8 10*3/uL (ref 3.8–10.8)

## 2020-07-23 LAB — HEMOGLOBIN A1C
Hgb A1c MFr Bld: 5.9 % of total Hgb — ABNORMAL HIGH (ref <5.7)
Mean Plasma Glucose: 123 (calc)
eAG (mmol/L): 6.8 (calc)

## 2020-07-23 LAB — TSH: TSH: 2.77 mIU/L (ref 0.40–4.50)

## 2020-07-23 LAB — VITAMIN B12: Vitamin B-12: 325 pg/mL (ref 200–1100)

## 2020-07-24 ENCOUNTER — Other Ambulatory Visit: Payer: Medicare Other

## 2020-07-24 ENCOUNTER — Other Ambulatory Visit: Payer: Self-pay

## 2020-07-24 ENCOUNTER — Encounter: Payer: Self-pay | Admitting: Cardiovascular Disease

## 2020-07-24 ENCOUNTER — Other Ambulatory Visit: Payer: Self-pay | Admitting: Physician Assistant

## 2020-07-24 ENCOUNTER — Ambulatory Visit: Payer: Medicare Other | Admitting: Cardiovascular Disease

## 2020-07-24 DIAGNOSIS — R600 Localized edema: Secondary | ICD-10-CM

## 2020-07-24 DIAGNOSIS — I10 Essential (primary) hypertension: Secondary | ICD-10-CM | POA: Diagnosis not present

## 2020-07-24 DIAGNOSIS — G1220 Motor neuron disease, unspecified: Secondary | ICD-10-CM

## 2020-07-24 DIAGNOSIS — I4811 Longstanding persistent atrial fibrillation: Secondary | ICD-10-CM

## 2020-07-24 NOTE — Assessment & Plan Note (Signed)
History of chronic [name] rate controlled on Eliquis oral anticoagulation 

## 2020-07-24 NOTE — Patient Instructions (Signed)
Medication Instructions:  Your Physician recommend you continue on your current medication as directed.    *If you need a refill on your cardiac medications before your next appointment, please call your pharmacy*   Lab Work: None    Testing/Procedures: None   Follow-Up: At [facility], you and your health needs are our priority.  As part of our continuing mission to provide you with exceptional heart care, we have created designated Provider Care Teams.  These Care Teams include your primary Cardiologist (physician) and Advanced Practice Providers (APPs -  Physician Assistants and Nurse Practitioners) who all work together to provide you with the care you need, when you need it.  We recommend signing up for the patient portal called "MyChart".  Sign up information is provided on this After Visit Summary.  MyChart is used to connect with patients for Virtual Visits (Telemedicine).  Patients are able to view lab/test results, encounter notes, upcoming appointments, etc.  Non-urgent messages can be sent to your provider as well.   To learn more about what you can do with MyChart, go to [email or web address].    Your next appointment:   6 month(s)  The format for your next appointment:   In Person  Provider:   [name], MD  Your physician recommends that you schedule a follow-up appointment in 3 months with a PA/NP

## 2020-07-24 NOTE — Progress Notes (Signed)
07/24/2020 Maurice Reed   05/26/1941  [id number]  Primary Physician Maurice Reed, [place] Primary Cardiologist: Maurice Harp MD Maurice Reed, [name]  HPI:  Maurice Reed is a 79 y.o.   moderately overweight married Caucasian male (wife is Maurice Reed), father of 1, grandfather 35 grandchildren who is retired from working in the [employer]. He was referred by Maurice Coke, PA-C to be established in our practice because of prior cardiac history.I last saw him in the office  05/29/2020.Marland Kitchen He has a greater than 100-pack-year history tobacco abuse having quit in 1985 and smoked 3 packs a day at that time. He does have treated hypertension, obstructive sleep apnea on BiPAP. He does wear oxygen nightly. He has chronic [name] and a history of remote PE on Eliquis oral anticoagulation. He is never had a heart attack or stroke. He has chronic shortness of breath but denies chest pain.  Since I saw himin the office 4 months ago he has remained stable.  He has seen Maurice Reed and Maurice Reed, [place] in the office in follow-up.  We have been adjusting his diuretics for chronic lower extremity edema probably related to diastolic dysfunction.  Is now on torsemide 40 mg in the morning and 20 in the evening with as needed torsemide for weight gain.  Recent lab work performed a week ago was acceptable.  He did have an echo performed 04/27/2020 revealing normal LV systolic function, moderate concentric LVH with mild aortic stenosis.  He has persistent [name] on Eliquis oral anticoagulation.  Since I saw him 6 weeks ago he developed an abscess on his lower back which was lanced by Dr. Lourena Reed in [place] he is on oral antibiotics for this.  He is on chronic O2 for COPD.  Apparently he has developed some motor changes and is being referred to a neurologist for further evaluation.    Current Meds  Medication Sig  . Ascorbic Acid (VITAMIN C) 500 MG CAPS Take 1 capsule by mouth daily.  . Atropine  Sulfate-NaCl 0.01-0.9 % SOLN Apply 1 drop to eye at bedtime.   . Cholecalciferol (VITAMIN D3) 3000 units TABS Take by mouth.  . clobetasol (TEMOVATE) 0.05 % external solution APPLY TO AFFECTED AREA(S)  ON SCALP TOPICALLY ONE TO  TWO TIMES A DAY AS NEEDED  . desonide (DESOWEN) 0.05 % lotion Apply topically.  . docusate sodium (COLACE) 100 MG capsule Take 100 mg by mouth daily.   Marland Kitchen doxycycline (VIBRA-TABS) 100 MG tablet Take 1 tablet (100 mg total) by mouth 2 (two) times daily.  Marland Kitchen ELIQUIS 5 MG TABS tablet TAKE 1 TABLET BY MOUTH  TWICE DAILY  . erythromycin ophthalmic ointment APP THIN LAYER IN OS BID  . ipratropium-albuterol (DUONEB) 0.5-2.5 (3) MG/3ML SOLN Take 3 mLs by nebulization every 6 (six) hours as needed.  . magnesium oxide (MAG-OX) 400 MG tablet Take 1 tablet (400 mg total) by mouth 2 (two) times daily.  . metoprolol succinate (TOPROL-XL) 25 MG 24 hr tablet TAKE 1 TABLET BY MOUTH  DAILY  . mometasone (NASONEX) 50 MCG/ACT nasal spray Place 2 sprays into the nose daily. (Patient taking differently: Place 2 sprays into the nose as needed. )  . NARCAN 4 MG/0.1ML LIQD nasal spray kit INSTILL ONE SPRAY PRN FOR ACCIDENTAL OVERDOSE  . oxyCODONE (OXY IR/ROXICODONE) 5 MG immediate release tablet Take 5 mg by mouth 2 (two) times daily as needed. for pain  . potassium chloride (MICRO-K) 10 MEQ CR capsule  TAKE 2 CAPSULES BY MOUTH IN THE AM AND 1 CAPSULE IN THE EVENING  . prednisoLONE acetate (PRED FORTE) 1 % ophthalmic suspension Place 1 drop into the left eye 2 (two) times daily.   . tadalafil (CIALIS) 20 MG tablet Take 0.5-1 tablets (10-20 mg total) by mouth every other day as needed for erectile dysfunction.  . Tafluprost, PF, 0.0015 % SOLN Apply 1 drop to eye daily. Drop 1 drop into right at night  . tamsulosin (FLOMAX) 0.4 MG CAPS capsule TAKE 1 CAPSULE BY MOUTH AT  BEDTIME  . temazepam (RESTORIL) 15 MG capsule Take 15 mg by mouth at bedtime as needed.  . torsemide (DEMADEX) 20 MG tablet TAKE 2  TABLETS BY MOUTH IN THE AM AND 1 TAB IN THE EVENING  . triamcinolone ointment (KENALOG) 0.5 % APPLY TO AFFECTED AREA(S)  TOPICALLY 1 TO 2 TIMES  DAILY     Allergies  Allergen Reactions  . Other Itching    Cats: watery eyes, itching, sneezing    Social History   Socioeconomic History  . Marital status: Married    Spouse name: Not on file  . Number of children: Not on file  . Years of education: Not on file  . Highest education level: Not on file  Occupational History  . Not on file  Tobacco Use  . Smoking status: Former Smoker    Packs/day: 3.00    Years: 25.00    Pack years: 75.00    Types: Cigarettes    Quit date: 11/28/1984    Years since quitting: 35.6  . Smokeless tobacco: Never Used  Vaping Use  . Vaping Use: Never used  Substance and Sexual Activity  . Alcohol use: Never  . Drug use: Never  . Sexual activity: Yes  Other Topics Concern  . Not on file  Social History Narrative   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64)   Married to [employer] ([place] patient)   7 children   Currently lives with son in [place], [place]; all their things are in storage; plan to travel and stay with kids but keep their home base in the [place] area   Social Determinants of Health   Financial Resource Strain:   . Difficulty of Paying Living Expenses: Not on file  Food Insecurity:   . Worried About Charity fundraiser in the Last Year: Not on file  . Ran Out of Food in the Last Year: Not on file  Transportation Needs:   . Lack of Transportation (Medical): Not on file  . Lack of Transportation (Non-Medical): Not on file  Physical Activity:   . Days of Exercise per Week: Not on file  . Minutes of Exercise per Session: Not on file  Stress:   . Feeling of Stress : Not on file  Social Connections:   . Frequency of Communication with Friends and Family: Not on file  . Frequency of Social Gatherings with Friends and Family: Not on file  .  Attends Religious Services: Not on file  . Active Member of Clubs or Organizations: Not on file  . Attends Archivist Meetings: Not on file  . Marital Status: Not on file  Intimate Partner Violence:   . Fear of Current or Ex-Partner: Not on file  . Emotionally Abused: Not on file  . Physically Abused: Not on file  . Sexually Abused: Not on file     Review of Systems: General: negative for  chills, fever, night sweats or weight changes.  Cardiovascular: negative for chest pain, dyspnea on exertion, edema, orthopnea, palpitations, paroxysmal nocturnal dyspnea or shortness of breath Dermatological: negative for rash Respiratory: negative for cough or wheezing Urologic: negative for hematuria Abdominal: negative for nausea, vomiting, diarrhea, bright red blood per rectum, melena, or hematemesis Neurologic: negative for visual changes, syncope, or dizziness All other systems reviewed and are otherwise negative except as noted above.    Blood pressure 130/68, pulse 82, height _0  (1.778 m), weight 276 lb (125.2 kg), SpO2 93 %.  General appearance: alert and no distress Neck: no adenopathy, no carotid bruit, no JVD, supple, symmetrical, trachea midline and thyroid not enlarged, symmetric, no tenderness/mass/nodules Lungs: clear to auscultation bilaterally Heart: irregularly irregular rhythm Extremities: Trace lower extremity edema Pulses: 2+ and symmetric Skin: Skin color, texture, turgor normal. No rashes or lesions Neurologic: Alert and oriented X 3, normal strength and tone. Normal symmetric reflexes. Normal coordination and gait  EKG not performed today  ASSESSMENT AND PLAN:   Essential hypertension History of essential hypertension a blood pressure measured today at 130/68.  He is on metoprolol.  Atrial fibrillation (HCC) History of chronic [name] rate controlled on Eliquis oral anticoagulation.  Bilateral lower extremity edema Bilateral lower extremity edema  improved with adjustment of his torsemide dose after I saw him 6 weeks ago.  This is most likely related to diastolic dysfunction.      Maurice Harp MD FACP,FACC,FAHA, [facility] 07/24/2020 5:07 PM

## 2020-07-24 NOTE — Assessment & Plan Note (Signed)
History of essential hypertension a blood pressure measured today at 130/68.  He is on metoprolol.

## 2020-07-24 NOTE — Assessment & Plan Note (Signed)
Bilateral lower extremity edema improved with adjustment of his torsemide dose after I saw him 6 weeks ago.  This is most likely related to diastolic dysfunction.

## 2020-07-25 ENCOUNTER — Ambulatory Visit (HOSPITAL_COMMUNITY)
Admission: RE | Admit: 2020-07-25 | Discharge: 2020-07-25 | Disposition: A | Discharge status: Home / Self Care | Payer: Medicare Other | Source: Ambulatory Visit | Attending: Physician Assistant | Admitting: Physician Assistant

## 2020-07-25 DIAGNOSIS — G1220 Motor neuron disease, unspecified: Secondary | ICD-10-CM

## 2020-07-25 LAB — C-REACTIVE PROTEIN: CRP: 2 mg/L (ref <8.0)

## 2020-07-25 LAB — CK: Total CK: 50 U/L (ref 44–196)

## 2020-07-25 LAB — SEDIMENTATION RATE: Sed Rate: 31 mm/h — ABNORMAL HIGH (ref 0–20)

## 2020-07-25 IMAGING — MR MR CERVICAL SPINE W/O CM
5 of 6 series · 36 of 48 positions shown · non-contrast
Comparison: None.

CLINICAL DATA: Bilateral leg weakness.  Motor neuron disease.

EXAM:
MRI CERVICAL SPINE WITHOUT CONTRAST
TECHNIQUE: Multiplanar, multisequence MR imaging of the cervical spine was
performed. No intravenous contrast was administered.

[Series 28: T1 · sagittal · 3.0mm · 0.62mm/px · 5 of 17 slices shown (1 of 2)]
[im 1/17]
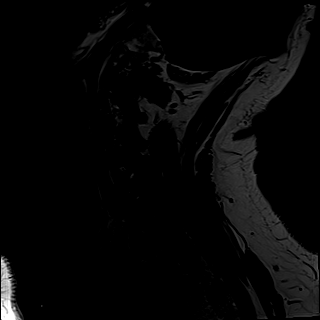
[im 5/17]
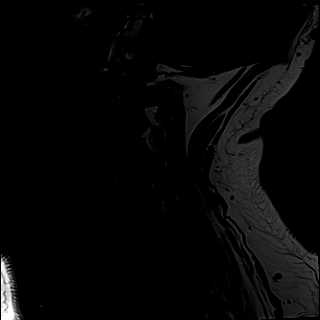
[im 9/17]
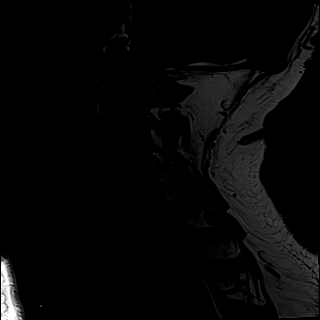
[im 13/17]
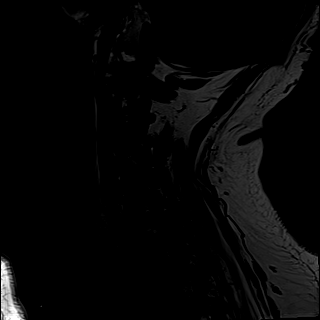
[im 17/17]
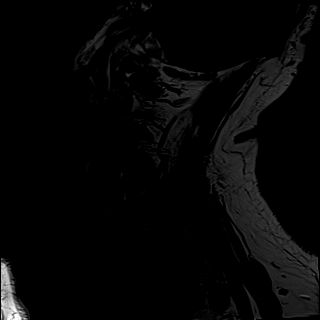

[Series 29: T2 · sagittal · 3.0mm · 0.62mm/px · 6 of 17 slices shown (1 of 2)]
[im 1/17]
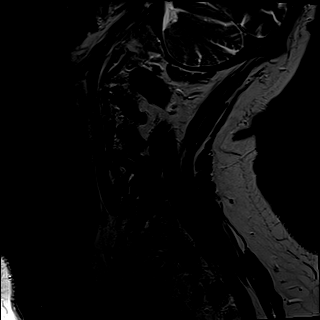
[im 4/17]
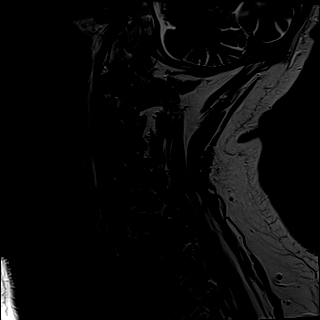
[im 7/17]
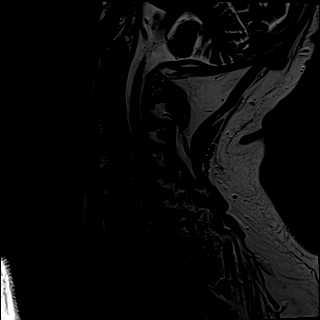
[im 10/17]
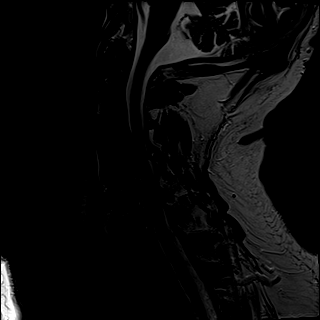
[im 13/17]
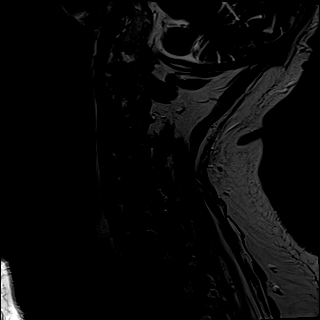
[im 17/17]
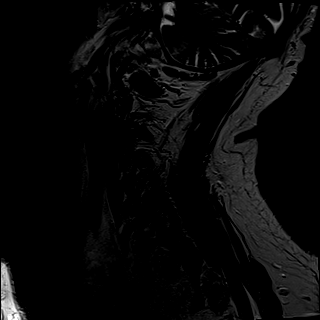

[Series 30: STIR · sagittal · 3.0mm · 0.78mm/px · 6 of 17 slices shown]
[im 1/17]
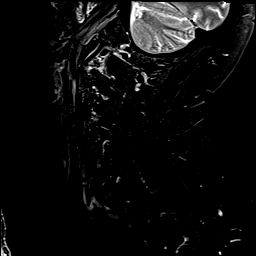
[im 4/17]
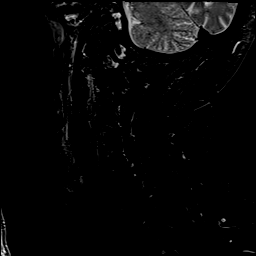
[im 7/17]
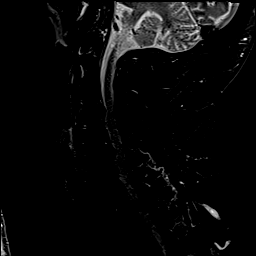
[im 10/17]
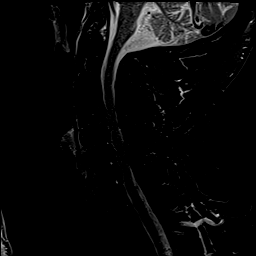
[im 13/17]
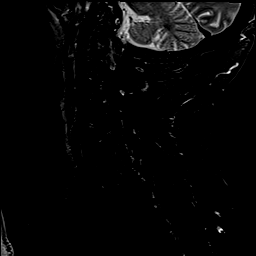
[im 17/17]
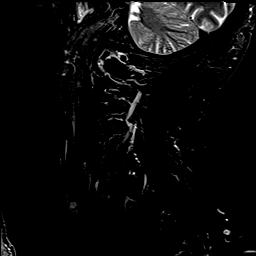

[Series 31: T2 · axial · 3.0mm · 0.70mm/px · z∈[-195,-95]mm · 11 of 32 slices shown (2 of 2)]
[im 1/32]
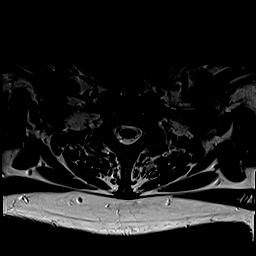
[im 4/32]
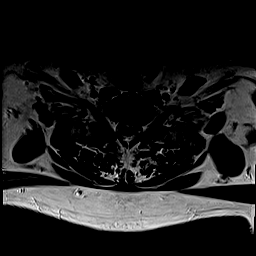
[im 7/32]
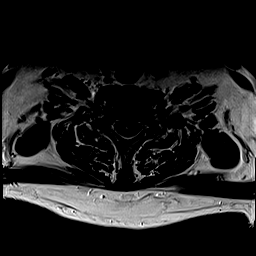
[im 10/32]
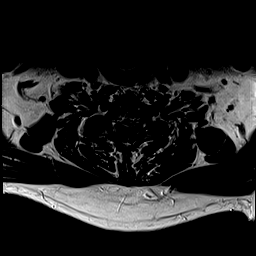
[im 13/32]
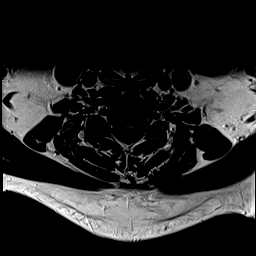
[im 16/32]
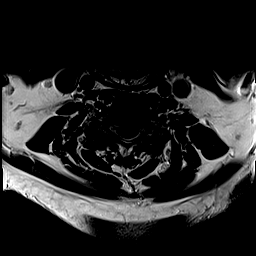
[im 19/32]
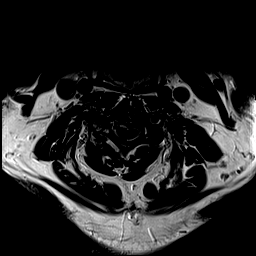
[im 22/32]
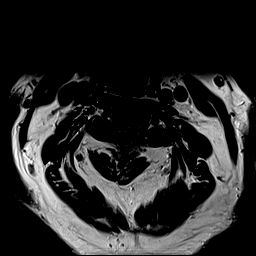
[im 25/32]
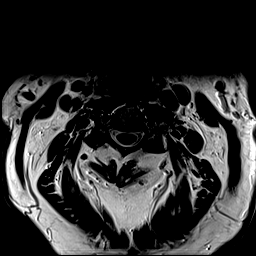
[im 28/32]
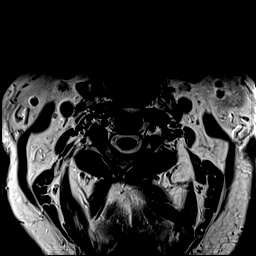
[im 32/32]
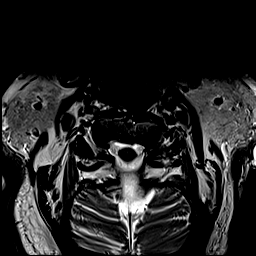

[Series 33: T1 · axial · 3.0mm · 0.35mm/px · z∈[-194,-97]mm · 8 of 31 slices shown (2 of 2)]
[im 1/31]
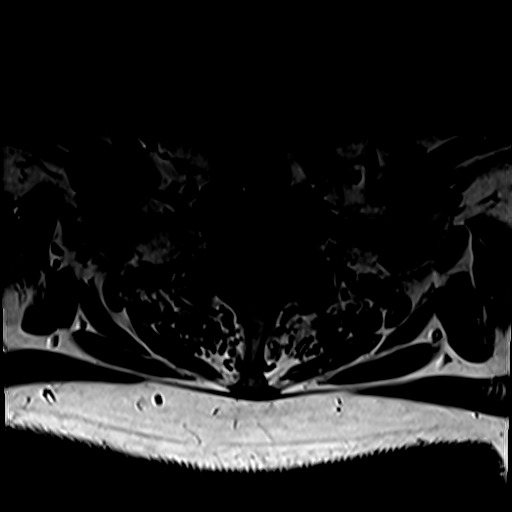
[im 4/31]
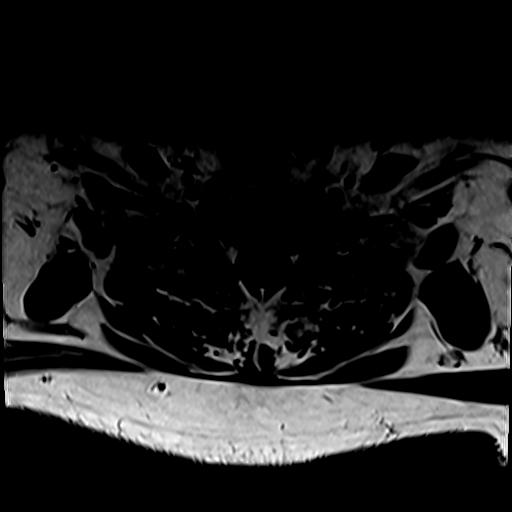
[im 11/31]
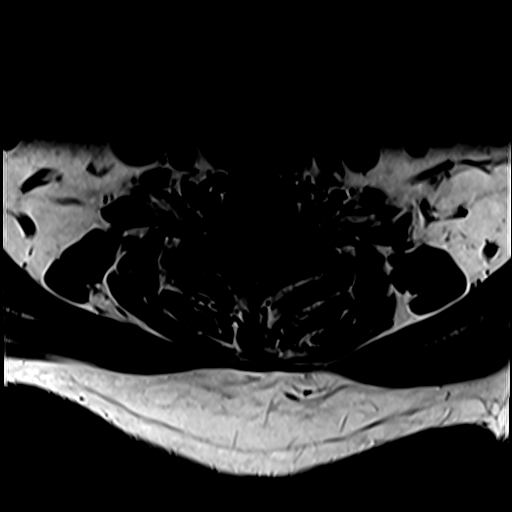
[im 14/31]
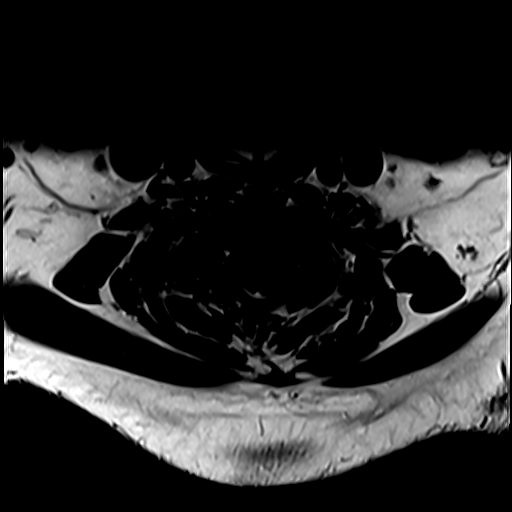
[im 17/31]
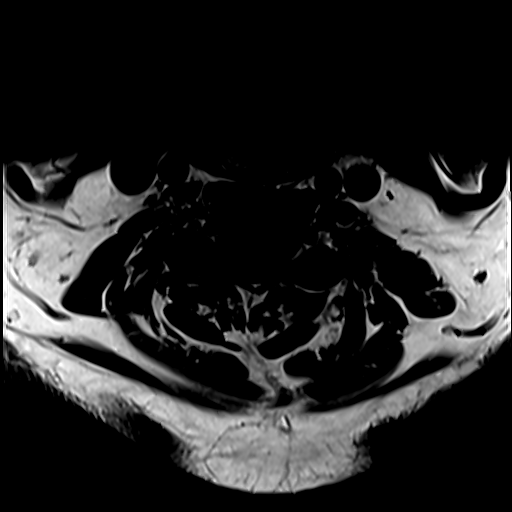
[im 21/31]
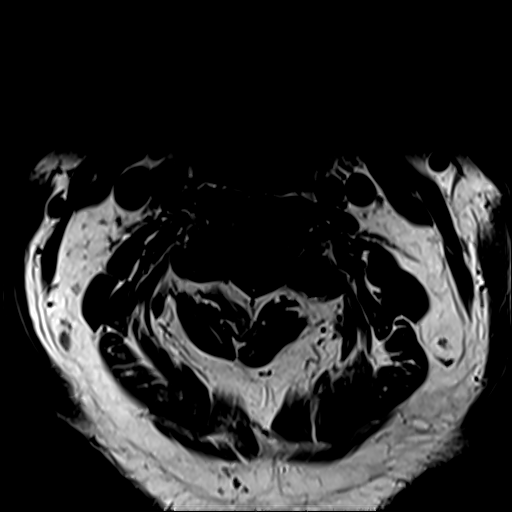
[im 27/31]
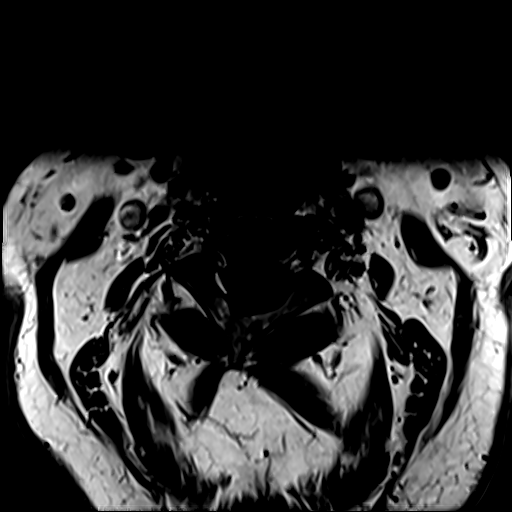
[im 31/31]
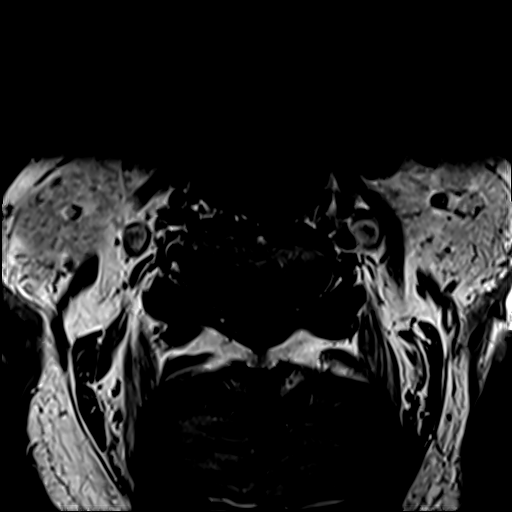

[36 of 48 positions shown; findings below may reference images not displayed]

FINDINGS: Alignment: Mild straightening of the normal cervical lordosis.

Vertebrae: No fracture or primary bone lesion.

Cord: No primary cord lesion.  See below regarding stenosis.

Posterior Fossa, vertebral arteries, paraspinal tissues: Negative

Disc levels:

Foramen magnum is widely patent.  C1-2 is negative.

C2-3: Minimal disc bulge. Mild facet osteoarthritis. No compressive
stenosis.

C3-4: Endplate osteophytes and bulging of the disc. Canal stenosis
with AP diameter in the midline of 9.2 mm. No cord compression.
Bilateral facet and uncovertebral osteophytes. Bilateral foraminal
narrowing could affect either C4 nerve.

C4-5: Endplate osteophytes and mild bulging of the disc. Facet
osteoarthritis on the right. No compressive canal stenosis. Mild
foraminal narrowing right more than left but no definite neural
compression.

C5-6: Spondylosis with endplate osteophytes and bulging of the disc.
Narrowing of the canal. Effacement of the subarachnoid space. AP
diameter in the midline 8 mm. Slight indentation of the cord. No
abnormal cord signal. Mild bilateral foraminal stenosis.

C6-7: Endplate osteophytes and disc protrusion more prominent to the
left of midline. Effacement of the subarachnoid space. AP diameter
of the canal in the midline 7.6 mm. Indentation of the ventral cord.
No abnormal cord signal. No foraminal stenosis.

C7-T1: Normal interspace.
IMPRESSION: No primary cord pathology evident in the cervical region.

Degenerative cervical spondylosis and facet osteoarthritis as above.
Canal stenosis with AP diameter of the canal at C5-6 8 mm and at
C6-7 7.6 mm. Effacement of the subarachnoid space with slight
indentation of cord, but no abnormal cord signal. Potential for
neural foraminal compression at multiple levels as outlined above.

## 2020-07-25 IMAGING — MR MR THORACIC SPINE W/O CM
7 series · 35 of 48 positions shown · non-contrast
Comparison: CT [DATE]

CLINICAL DATA: Bilateral leg weakness.  Motor neuron disease.

EXAM:
MRI THORACIC SPINE WITHOUT CONTRAST
TECHNIQUE: Multiplanar, multisequence MR imaging of the thoracic spine was
performed. No intravenous contrast was administered.

[Series 49: T1 · sagittal · 4.0mm · 1.72mm/px · 3 of 17 slices shown (1 of 3)]
[im 1/17]
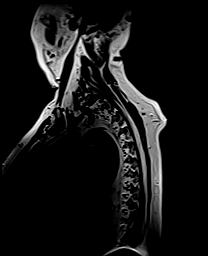
[im 9/17]
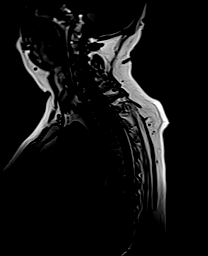
[im 17/17]
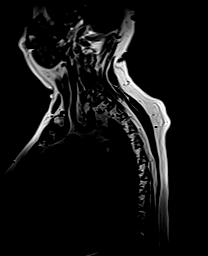

[Series 50: STIR · sagittal · 3.0mm · 0.97mm/px · 5 of 19 slices shown]
[im 1/19]
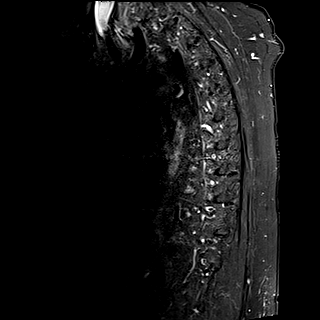
[im 5/19]
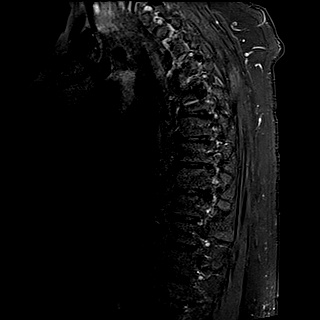
[im 10/19]
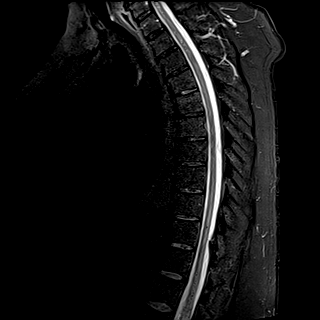
[im 14/19]
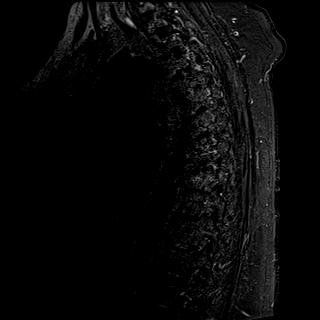
[im 19/19]
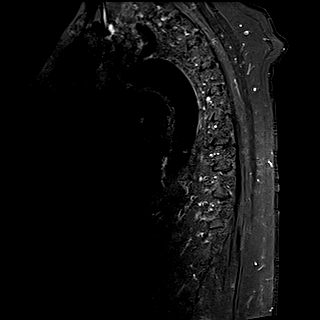

[Series 51: T1 · sagittal · 3.0mm · 0.97mm/px · 5 of 19 slices shown (2 of 3)]
[im 1/19]
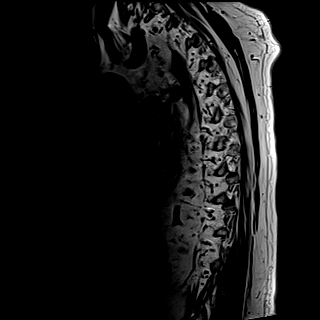
[im 5/19]
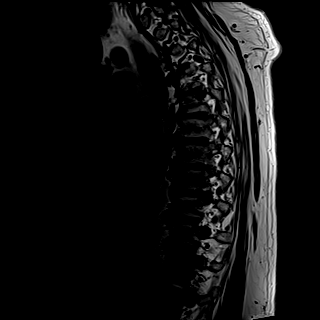
[im 10/19]
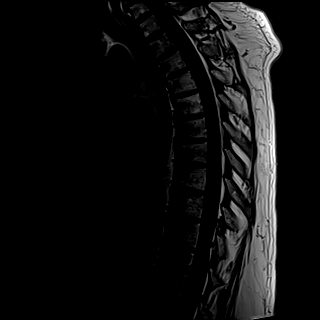
[im 14/19]
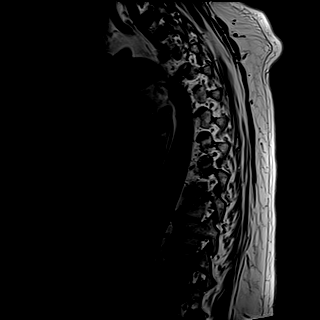
[im 19/19]
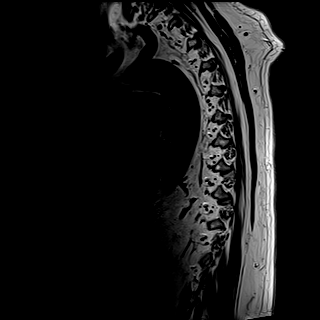

[Series 52: T2 · sagittal · 3.0mm · 0.83mm/px · 5 of 19 slices shown (1 of 2)]
[im 1/19]
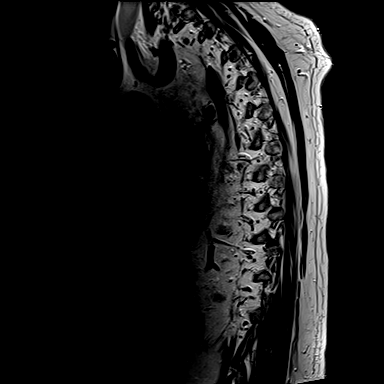
[im 5/19]
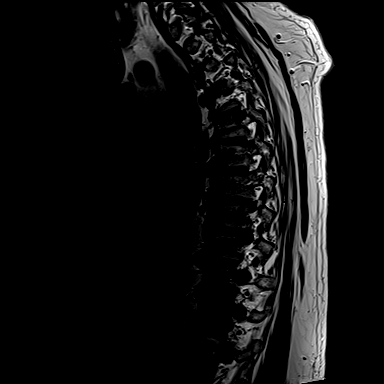
[im 10/19]
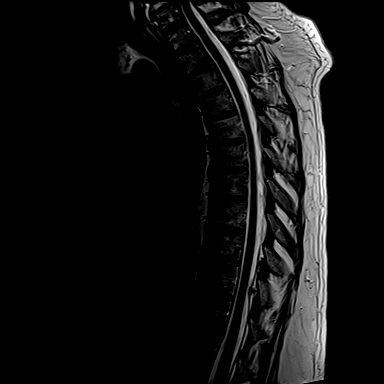
[im 14/19]
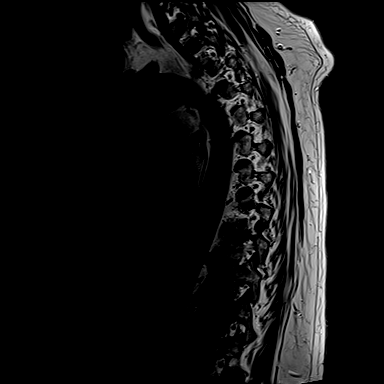
[im 19/19]
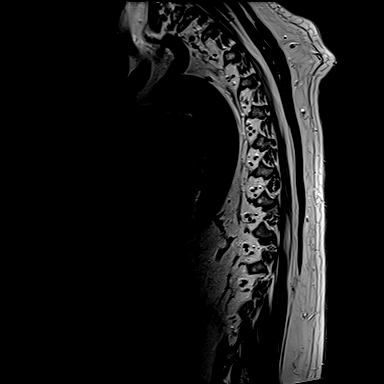

[Series 53: T2 · axial · 4.0mm · 0.78mm/px · z∈[-391,-185]mm · 8 of 39 slices shown (2 of 2)]
[im 1/39]
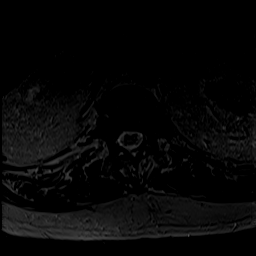
[im 5/39]
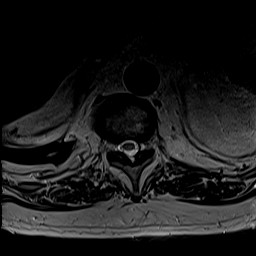
[im 13/39]
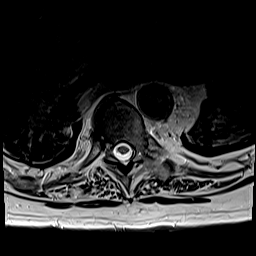
[im 17/39]
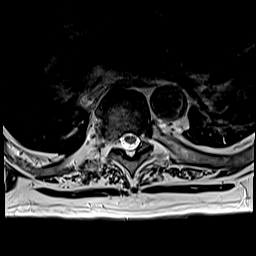
[im 22/39]
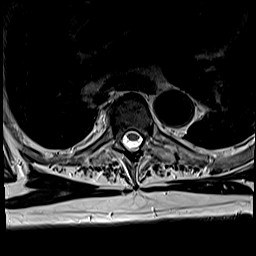
[im 26/39]
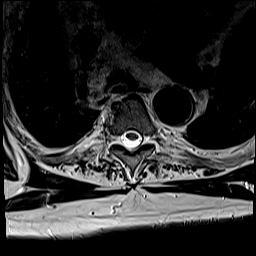
[im 34/39]
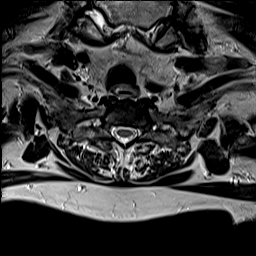
[im 39/39]
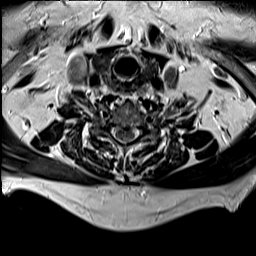

[Series 54: t2_me2d_tra thoracic · axial · 4.0mm · 0.39mm/px · 1 of 39 slices shown]
[im 1/39]
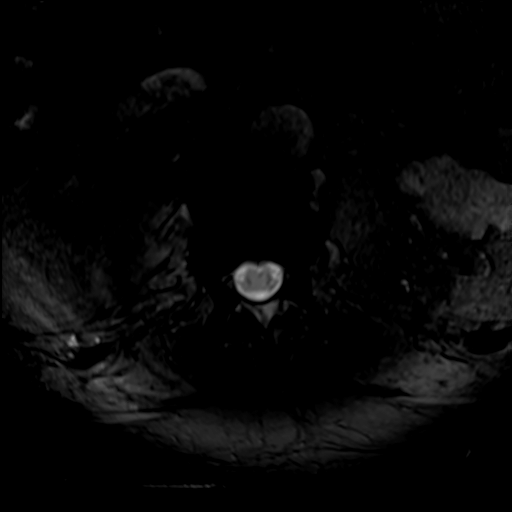

[Series 55: T1 · axial · 4.0mm · 0.39mm/px · z∈[-391,-185]mm · 8 of 39 slices shown (3 of 3)]
[im 1/39]
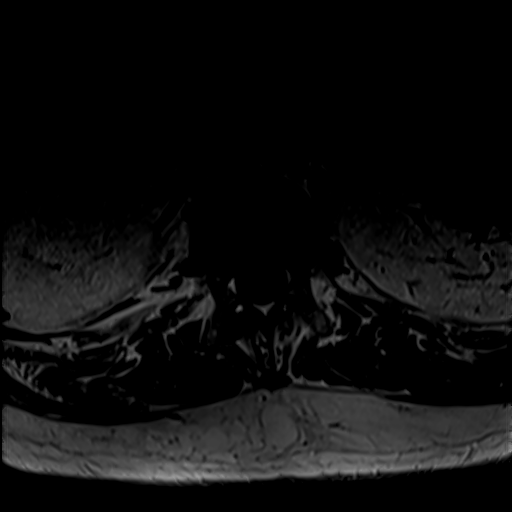
[im 5/39]
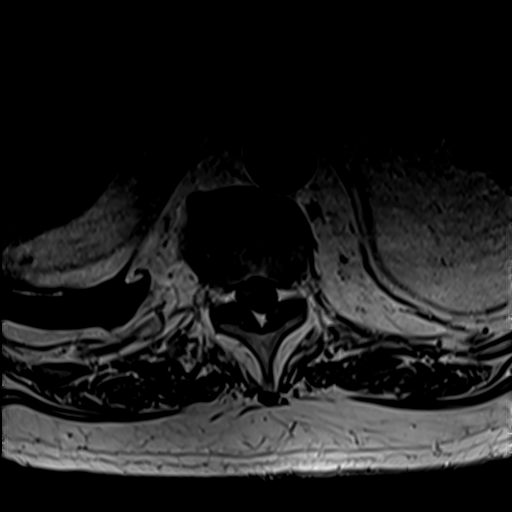
[im 13/39]
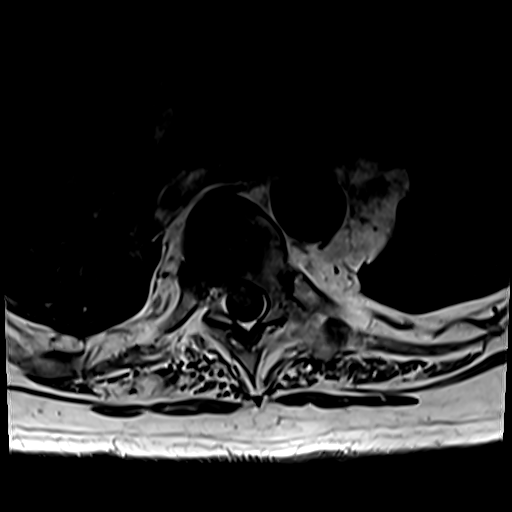
[im 17/39]
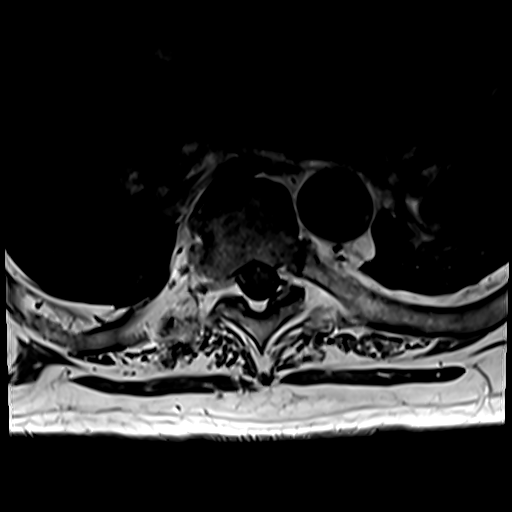
[im 22/39]
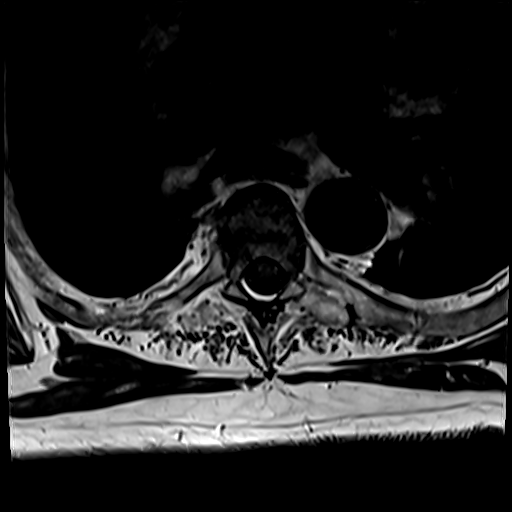
[im 26/39]
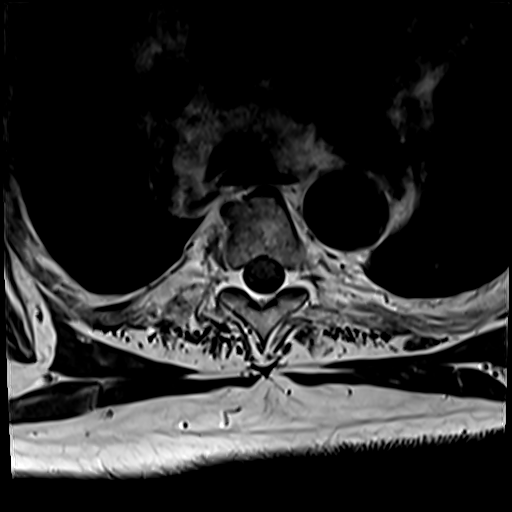
[im 34/39]
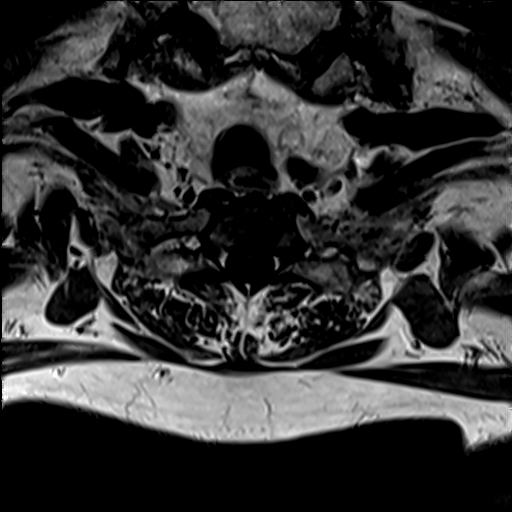
[im 39/39]
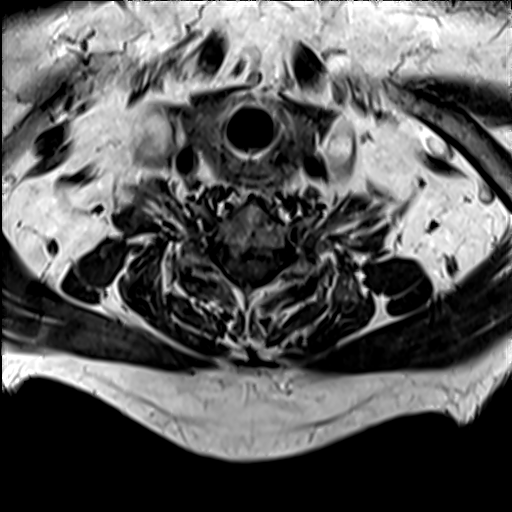

[35 of 48 positions shown; findings below may reference images not displayed]

FINDINGS: Alignment:  Normal

Vertebrae: No fracture or primary bone lesion.

Cord: Slightly increased T2 signal affecting the central cord, more
prominent towards the lower thoracic region. This suggests a central
cord syndrome.

Paraspinal and other soft tissues: Negative

Disc levels:

No disc pathology. Facet and ligamentous hypertrophy at T9-10 and
T10-11, indenting the dorsal thecal sac. At T10-11, this appears to
deform the right dorsal cord slightly. Subarachnoid space does
continue to surround the cord.
IMPRESSION: Increased T2 signal within the central thoracic cord, moderate in
degree but fairly convincing particularly in the mid to lower
thoracic region. Question ischemic insult.

Posterior element hypertrophic change at T9-10 and T10-11. On the
right at T10-11, there is slight indentation of the dorsal cord,
though subarachnoid space does continue to surround the cord.

## 2020-07-25 IMAGING — MR MR HEAD W/O CM
11 series · 48 of 48 positions shown · non-contrast
Comparison: None.

CLINICAL DATA: Bilateral leg weakness.  Motor neuron disease.

EXAM:
MRI HEAD WITHOUT CONTRAST
TECHNIQUE: Multiplanar, multiecho pulse sequences of the brain and surrounding
structures were obtained without intravenous contrast.

[Series 5: DWI · axial · 3.0mm · 1.36mm/px · z∈[+3,+158]mm · 7 of 112 slices shown (1 of 4)]
[im 1/112]
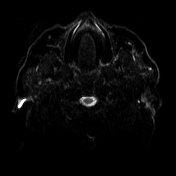
[im 19/112]
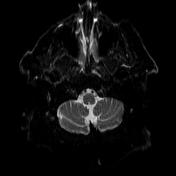
[im 38/112]
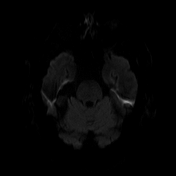
[im 56/112]
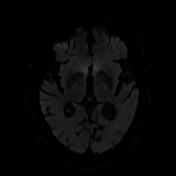
[im 75/112]
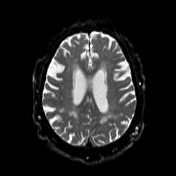
[im 93/112]
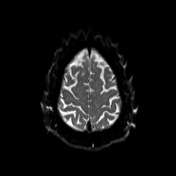
[im 112/112]
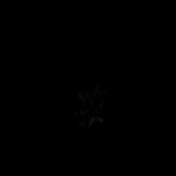

[Series 6: DWI · axial · 3.0mm · 1.36mm/px · z∈[+3,+158]mm · 4 of 56 slices shown (2 of 4)]
[im 1/56]
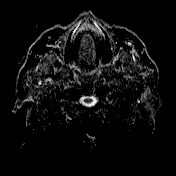
[im 19/56]
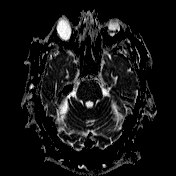
[im 37/56]
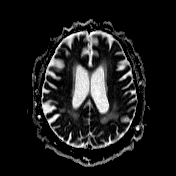
[im 56/56]
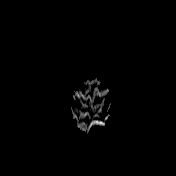

[Series 7: T1 · sagittal · 5.0mm · 0.75mm/px · 2 of 26 slices shown (1 of 2)]
[im 1/26]
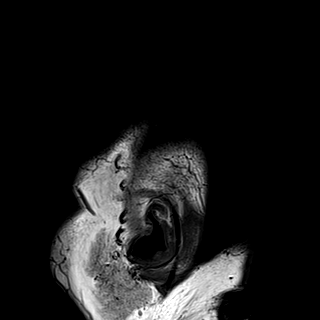
[im 26/26]
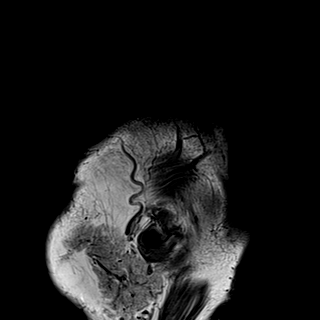

[Series 8: T2 · axial · 5.0mm · 0.62mm/px · z∈[+2,+155]mm · 2 of 26 slices shown (1 of 2)]
[im 1/26]
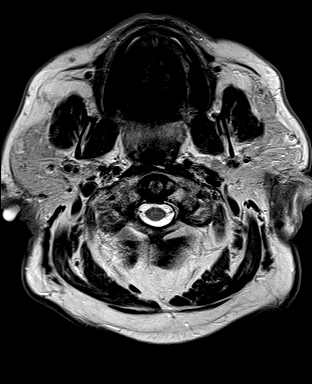
[im 26/26]
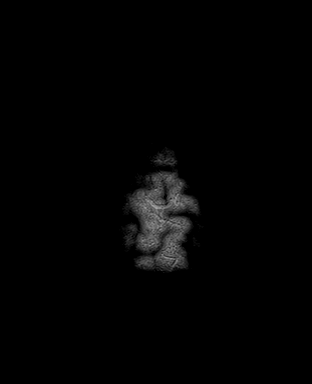

[Series 9: mip_images(sw) · axial · 32.0mm · 0.75mm/px · z∈[+11,+146]mm · 3 of 37 slices shown]
[im 1/37]
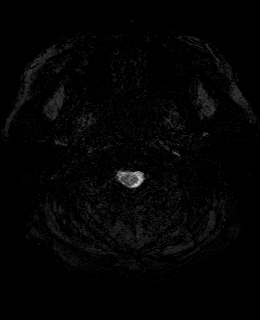
[im 19/37]
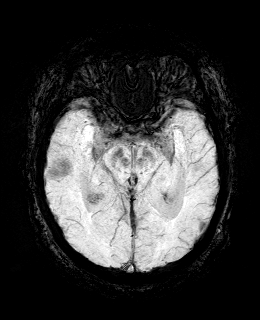
[im 37/37]
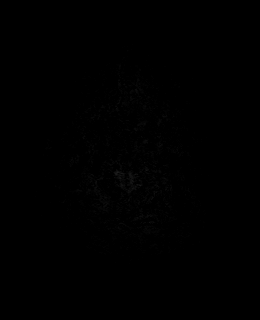

[Series 10: swi_images · axial · 4.0mm · 0.75mm/px · z∈[-2,+159]mm · 3 of 44 slices shown]
[im 1/44]
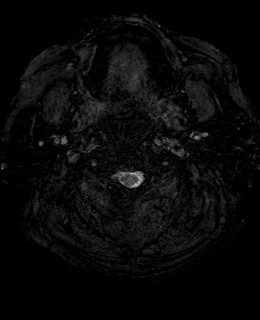
[im 22/44]
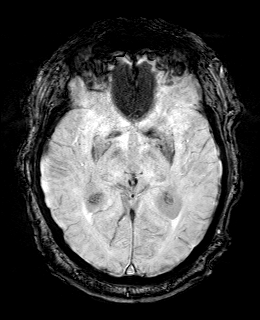
[im 44/44]
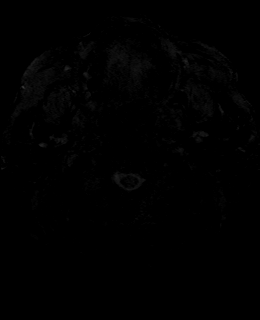

[Series 11: FLAIR · axial · 3.0mm · 0.75mm/px · z∈[+4,+153]mm · 4 of 54 slices shown]
[im 1/54]
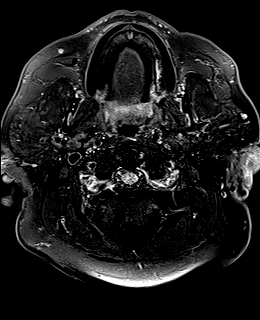
[im 18/54]
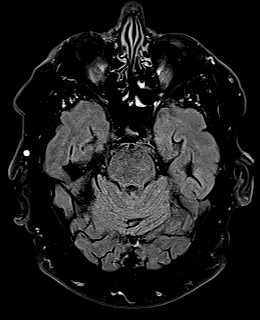
[im 36/54]
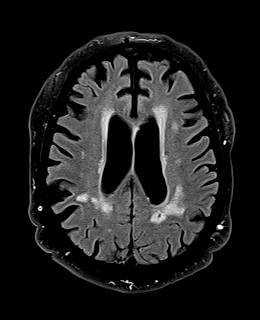
[im 54/54]
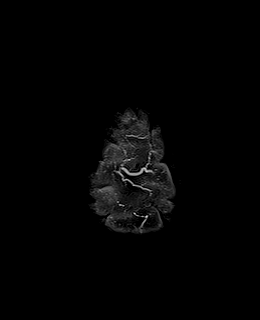

[Series 12: T1 · axial · 1.0mm · 0.94mm/px · z∈[+4,+155]mm · 12 of 160 slices shown (2 of 2)]
[im 1/160]
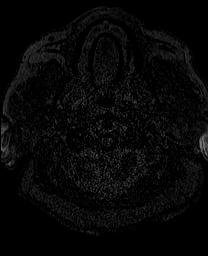
[im 15/160]
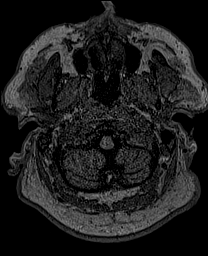
[im 29/160]
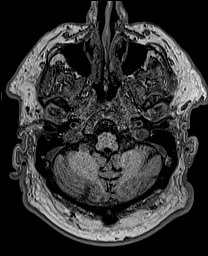
[im 44/160]
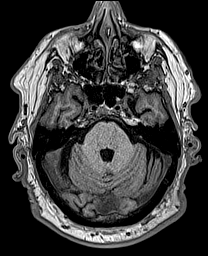
[im 58/160]
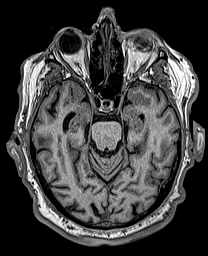
[im 73/160]
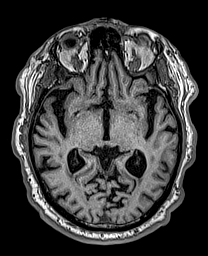
[im 87/160]
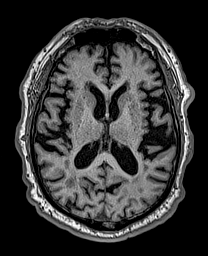
[im 102/160]
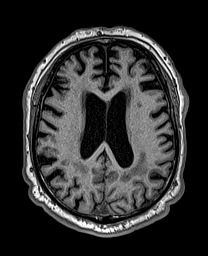
[im 116/160]
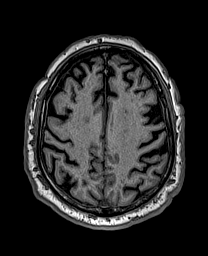
[im 131/160]
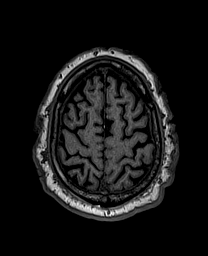
[im 145/160]
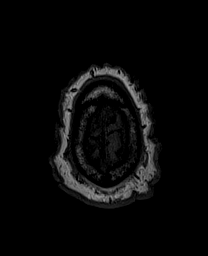
[im 160/160]
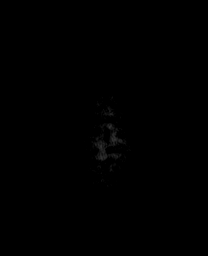

[Series 13: DWI · coronal · 5.0mm · 1.31mm/px · 5 of 76 slices shown (3 of 4)]
[im 1/76]
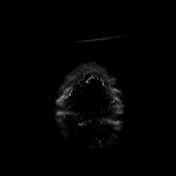
[im 19/76]
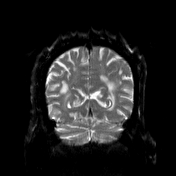
[im 38/76]
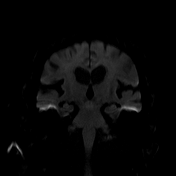
[im 57/76]
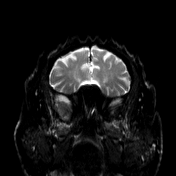
[im 76/76]
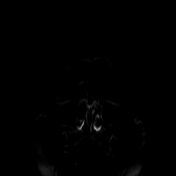

[Series 14: DWI · coronal · 5.0mm · 1.31mm/px · 3 of 38 slices shown (4 of 4)]
[im 1/38]
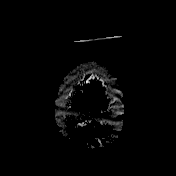
[im 19/38]
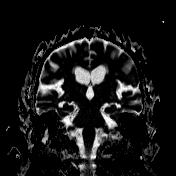
[im 38/38]
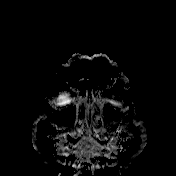

[Series 15: T2 · coronal · 5.0mm · 0.57mm/px · 3 of 38 slices shown (2 of 2)]
[im 1/38]
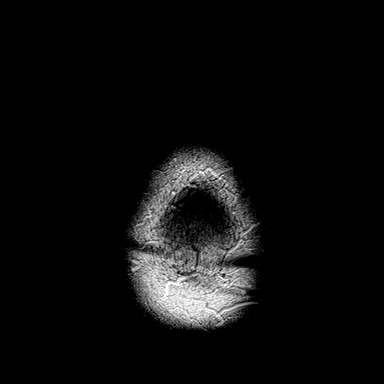
[im 19/38]
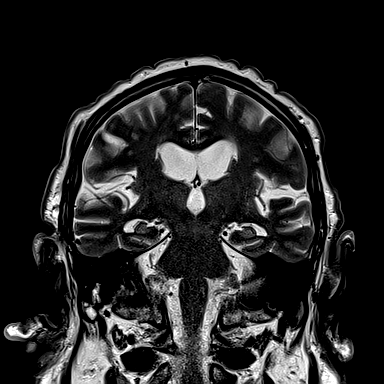
[im 38/38]
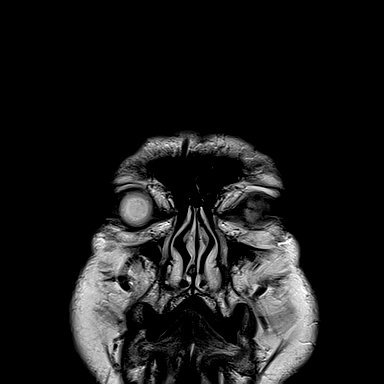

[48 of 48 positions shown; findings below may reference images not displayed]

FINDINGS: Brain: Diffusion imaging does not show any acute or subacute
infarction. No focal abnormality affects the brainstem or
cerebellum. Cerebral hemispheres show age related volume loss with
moderate chronic small-vessel ischemic changes of the white matter.
No cortical or large vessel territory infarction. No mass lesion,
hemorrhage, hydrocephalus or extra-axial collection.

Vascular: Major vessels at the base of the brain show flow.

Skull and upper cervical spine: Negative

Sinuses/Orbits: Clear sinuses.  Chronic phthisis bulbi on the left.

Other: None
IMPRESSION: No acute intracranial finding. Moderate chronic small-vessel
ischemic changes of the cerebral hemispheric white matter.

## 2020-07-25 IMAGING — MR MR LUMBAR SPINE W/O CM
5 series · 32 of 48 positions shown · non-contrast
Comparison: Thoracic study earlier today.

CLINICAL DATA: Bilateral leg weakness.  Motor neuron disease.

EXAM:
MRI LUMBAR SPINE WITHOUT CONTRAST
TECHNIQUE: Multiplanar, multisequence MR imaging of the lumbar spine was
performed. No intravenous contrast was administered.

[Series 56: T1 · sagittal · 4.0mm · 0.75mm/px · 7 of 17 slices shown (1 of 2)]
[im 1/17]
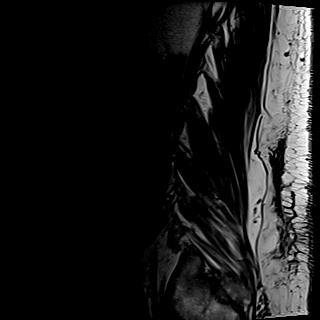
[im 3/17]
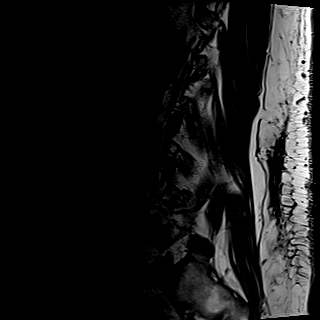
[im 6/17]
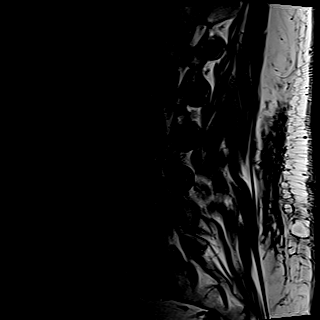
[im 9/17]
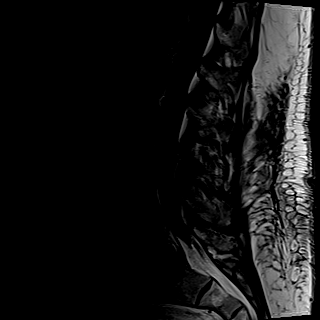
[im 11/17]
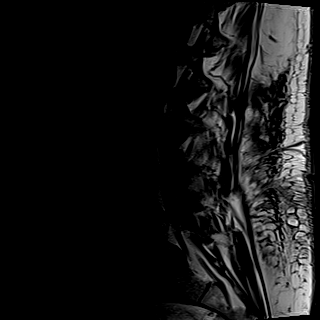
[im 14/17]
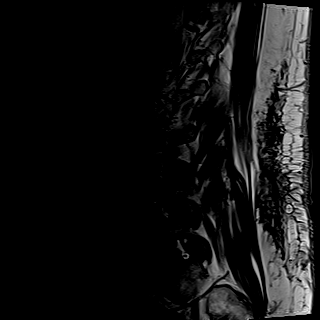
[im 17/17]
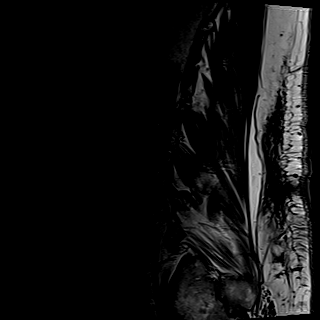

[Series 57: T2 · sagittal · 4.0mm · 0.75mm/px · 7 of 17 slices shown (1 of 2)]
[im 1/17]
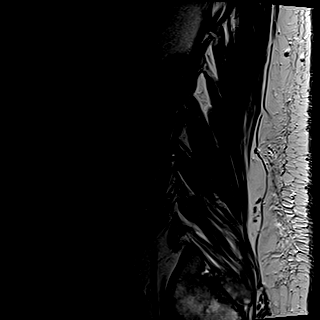
[im 3/17]
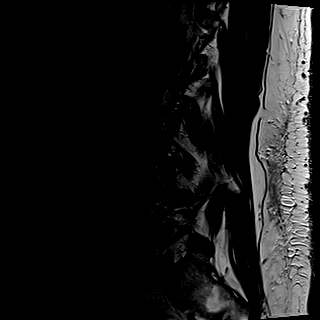
[im 6/17]
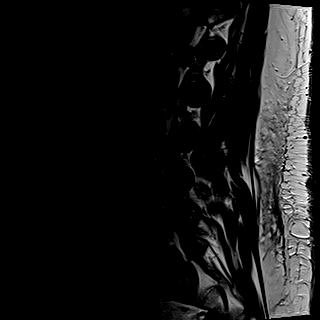
[im 9/17]
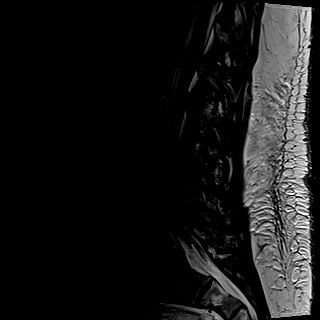
[im 11/17]
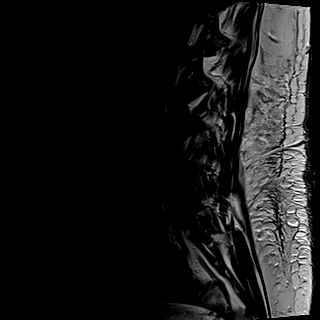
[im 14/17]
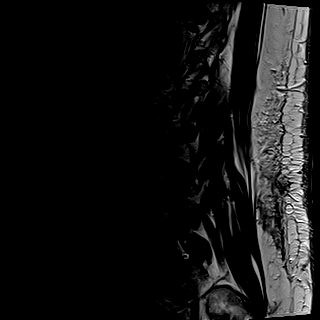
[im 17/17]
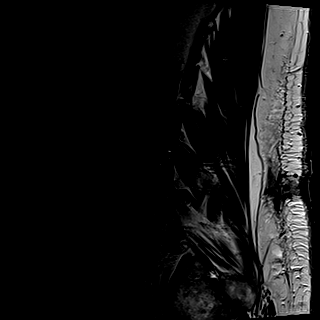

[Series 58: STIR · sagittal · 4.0mm · 0.47mm/px · 2 of 17 slices shown]
[im 1/17]
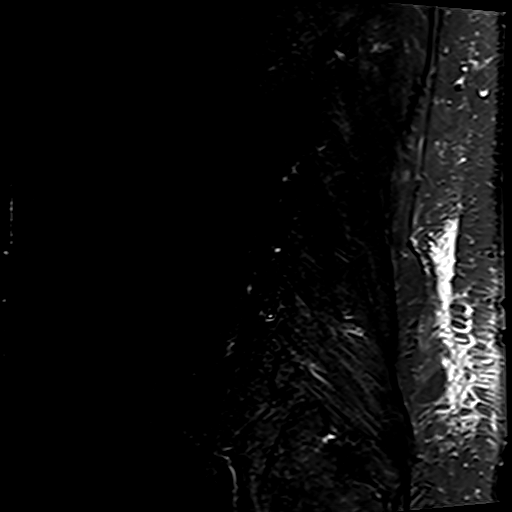
[im 4/17]
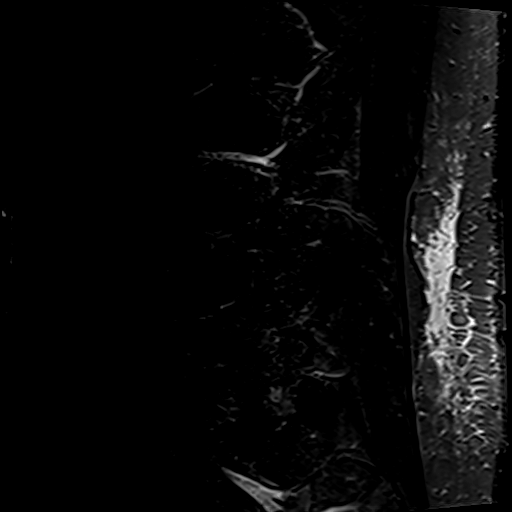

[Series 59: T2 · axial · 4.0mm · 0.62mm/px · z∈[-655,-443]mm · 8 of 38 slices shown (2 of 2)]
[im 1/38]
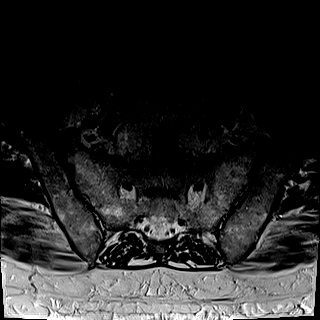
[im 6/38]
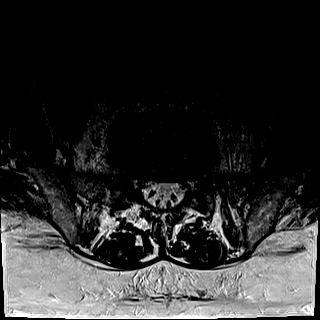
[im 12/38]
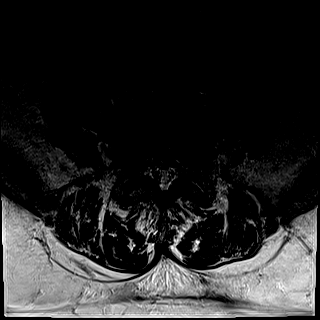
[im 18/38]
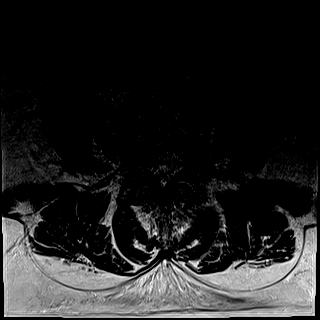
[im 20/38]
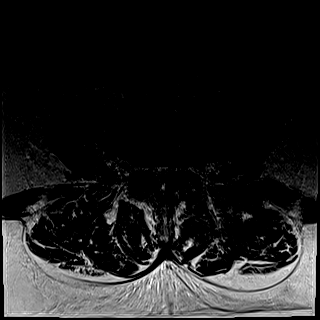
[im 26/38]
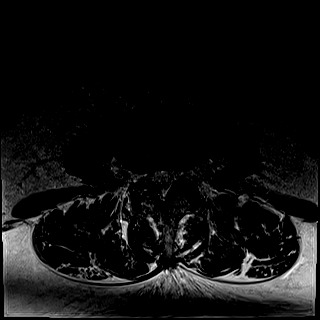
[im 32/38]
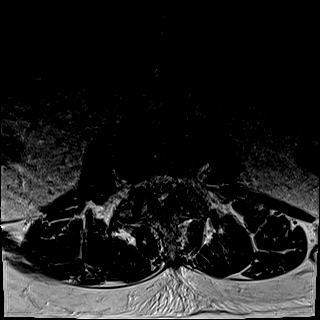
[im 38/38]
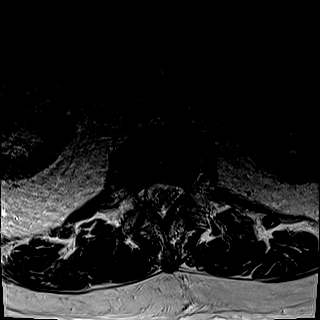

[Series 60: T1 · axial · 4.0mm · 0.39mm/px · z∈[-655,-443]mm · 8 of 38 slices shown (2 of 2)]
[im 1/38]
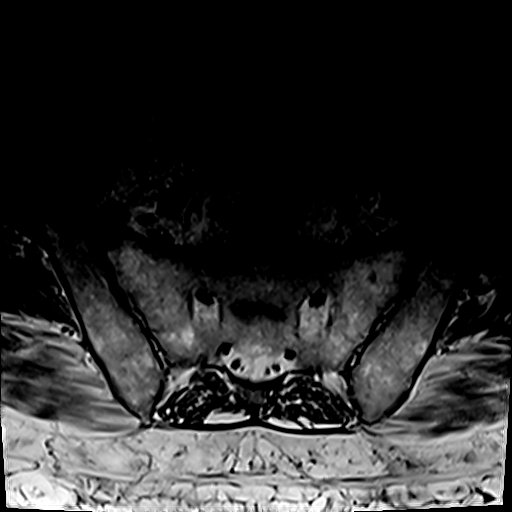
[im 6/38]
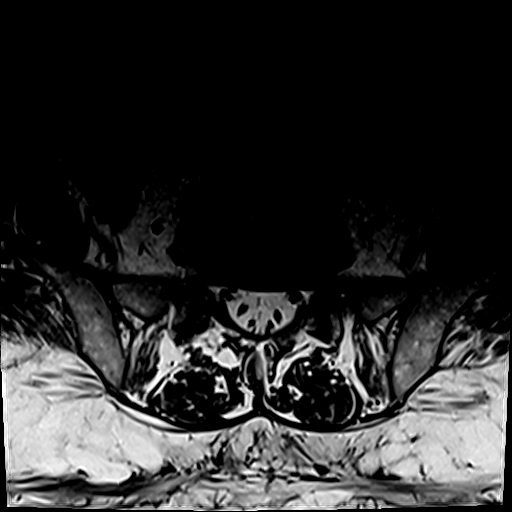
[im 12/38]
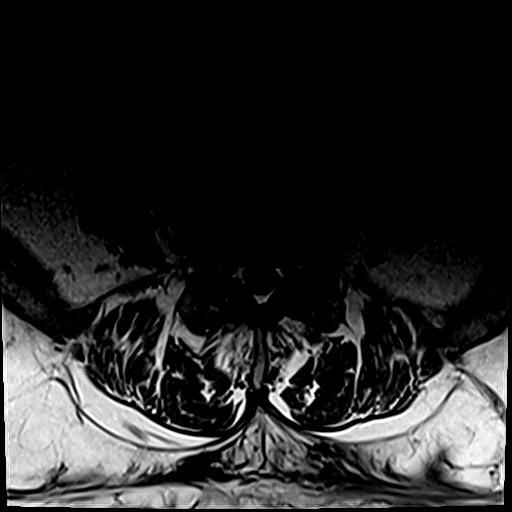
[im 18/38]
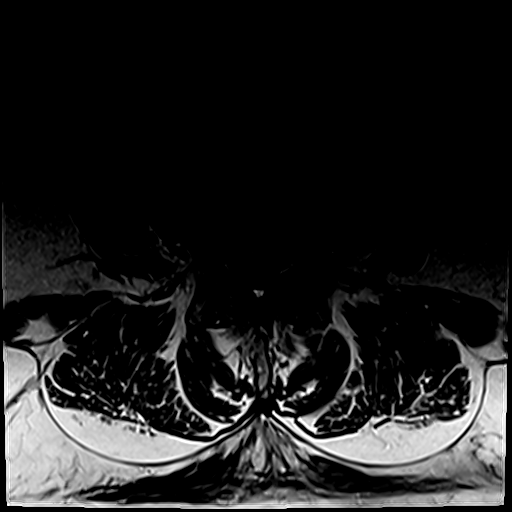
[im 20/38]
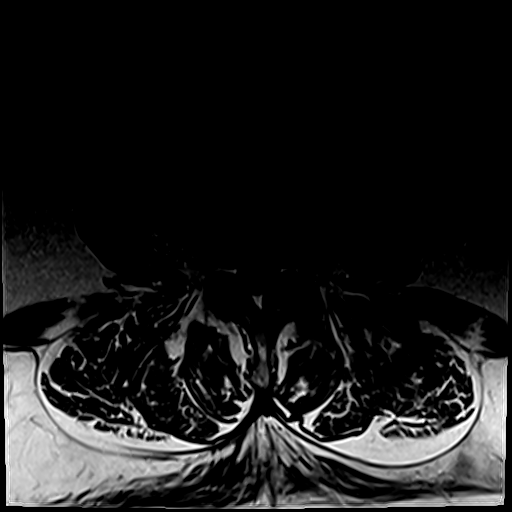
[im 26/38]
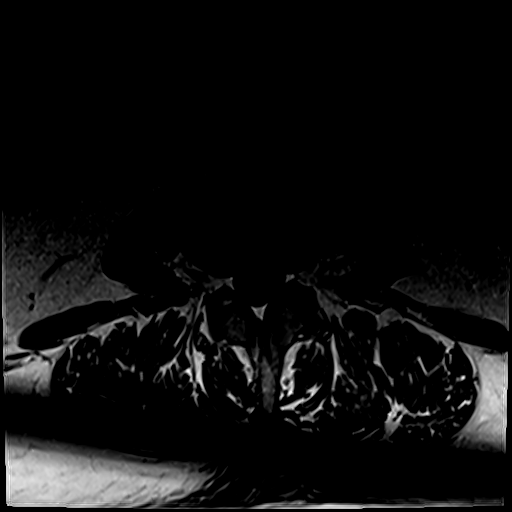
[im 32/38]
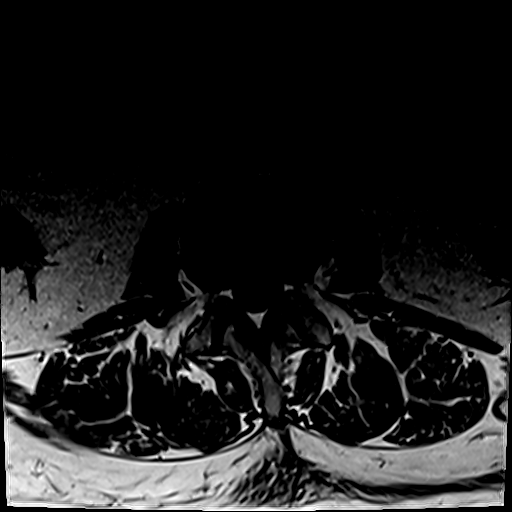
[im 38/38]
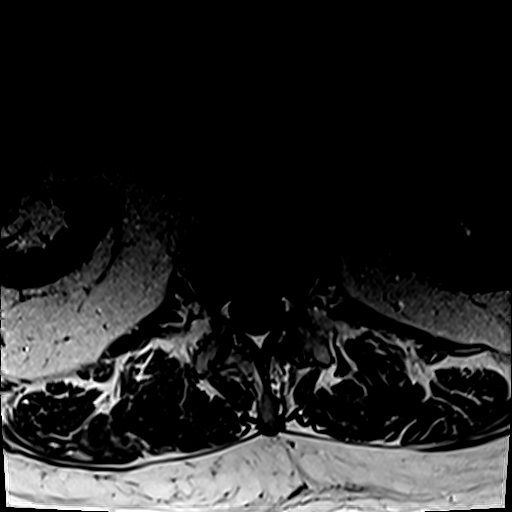

[32 of 48 positions shown; findings below may reference images not displayed]

FINDINGS: Segmentation:  5 lumbar type vertebral bodies.

Alignment:  Normal

Vertebrae:  No fracture or primary lesion.

Conus medullaris and cauda equina: Conus extends to the L1 level.
Conus and cauda equina appear normal. I do not see abnormal signal
in the distal cord at the T12 level or at the conus tip.

Paraspinal and other soft tissues: Negative

Disc levels:

No abnormality at L1-2 or L2-3.

L3-4: Bulging of the disc. Mild facet hypertrophy. Mild
multifactorial stenosis without distinct neural compression.

L4-5: Bulging of the disc. Facet and ligamentous hypertrophy with
some edema. Mild narrowing of the canal and lateral recesses without
distinct neural compression. The facet arthropathy could be a cause
of back pain.

L5-S1: Minimal disc bulge. No canal or foraminal stenosis.
Diminutive thecal sac at this level.
IMPRESSION: The distal thoracic cord at the T12 level and the conus tip at L1
appear normal on this exam.

Disc bulges and facet osteoarthritis at L3-4, L4-5 and L5-S1 but
without evidence of compressive stenosis. Some facet edema at L4-5
could be associated with back pain.

## 2020-07-27 ENCOUNTER — Telehealth: Payer: Self-pay | Admitting: Physician Assistant

## 2020-07-27 ENCOUNTER — Encounter: Payer: Self-pay | Admitting: Neurology

## 2020-07-27 ENCOUNTER — Other Ambulatory Visit: Payer: Self-pay | Admitting: *Deleted

## 2020-07-27 DIAGNOSIS — R29898 Other symptoms and signs involving the musculoskeletal system: Secondary | ICD-10-CM

## 2020-07-27 NOTE — Telephone Encounter (Signed)
Spoke to pt's wife [name] went over results of MRI and discussed referral to Neurology. See result notes. [name] verbalized understanding.

## 2020-07-27 NOTE — Telephone Encounter (Signed)
Patients wife would like a call back to discuss the patients results. Patient said he couldn't understand the results that were given to him and would like his wife to know what is going on. Thornton Papas: [phone]

## 2020-07-28 ENCOUNTER — Telehealth: Payer: Self-pay

## 2020-07-28 DIAGNOSIS — R29898 Other symptoms and signs involving the musculoskeletal system: Secondary | ICD-10-CM

## 2020-07-28 DIAGNOSIS — J9611 Chronic respiratory failure with hypoxia: Secondary | ICD-10-CM

## 2020-07-28 NOTE — Telephone Encounter (Signed)
Pt.'s wife would like to speak with someone. She got his appt scheduled with neuro on sept 16th. She would like to know Samantha's opinion on if Rhonin should be placed in a different level of care.

## 2020-07-28 NOTE — Telephone Encounter (Signed)
Please get more information and ask if she got any response about home health orders I put in?

## 2020-07-28 NOTE — Telephone Encounter (Signed)
Spoke to pt's wife [name], told her calling about her message to see what level of care you are talking about? Also have you heard from [facility] for pt orders were placed. [name] said she wanted to know Samantha's opinion about Palliative Care and she has not heard from [facility] yet. Told her [name] said she will gladly put in referral to Palliative Care and discuss with them what they think is the best thing for him to do. Also you should be hearing from [facility]. [name] verbalized understanding. Orders placed in Epic.

## 2020-07-28 NOTE — Telephone Encounter (Signed)
Please see message. °

## 2020-08-04 ENCOUNTER — Telehealth: Payer: Self-pay

## 2020-08-04 NOTE — Telephone Encounter (Signed)
I have messaged [name] both yesterday and today but have not heard anything back. Will let you know as soon as I hear back from him.

## 2020-08-04 NOTE — Telephone Encounter (Signed)
Maurice Reed, can you look into Home Health and Palliative Care referrals.

## 2020-08-04 NOTE — Telephone Encounter (Signed)
Pt.'s wife wants to let you know that they have still not heard anything from home health nor palliative care and its been almost a week.

## 2020-08-05 NOTE — Telephone Encounter (Signed)
Maurice Reed was referred to [facility]. [name] is going to get an update from them.

## 2020-08-05 NOTE — Telephone Encounter (Signed)
SOC was 8/30 with [facility]

## 2020-08-06 ENCOUNTER — Telehealth: Payer: Self-pay | Admitting: Hospice

## 2020-08-06 NOTE — Telephone Encounter (Signed)
Called patient to schedule a Palliative Consult, no answer - left message with reason for call along with my name and call back number.    I also called wife's cell and left a message as well.

## 2020-08-06 NOTE — Telephone Encounter (Signed)
I called [facility] and they said they are unable to see the patient at the time. Sent referral to [facility]. Left a voicemail with the marketing coordinator, Lorenza Chick,   asking to call me as soon as possible for the referral.

## 2020-08-06 NOTE — Telephone Encounter (Signed)
Maurice Reed, pt still not contacted about Home Health, he needs this for wound care.

## 2020-08-06 NOTE — Telephone Encounter (Signed)
Spoke to [place] pt's wife told her following up on referrals, asked her if [facility] got in touch with her for Home Health or Palliative Care. [name] said no one from [facility] has called and Palliative care called but she missed there call and called back and left a message. Told her okay I will look into the Home Health referral and I apologized for it taking so long. [name] verbalized understanding and said thank you for following up.

## 2020-08-06 NOTE — Telephone Encounter (Signed)
Rec'd return call from wife, [name], and have scheduled an In-person Palliative Consult for 08/17/20 @ 1:30 PM.

## 2020-08-11 ENCOUNTER — Telehealth: Payer: Self-pay | Admitting: Physician Assistant

## 2020-08-11 NOTE — Telephone Encounter (Signed)
Completed forms and gave to [facility] our referral coordinator to finish referral.

## 2020-08-11 NOTE — Telephone Encounter (Signed)
Has dropped off [facility] Referral to be completed for patient to received home health.  Sending back to [place].

## 2020-08-14 ENCOUNTER — Other Ambulatory Visit: Payer: Self-pay | Admitting: *Deleted

## 2020-08-14 MED ORDER — TEMAZEPAM 15 MG PO CAPS: 15.0000 mg | ORAL_CAPSULE | Freq: Every evening | ORAL | 1 refills | Status: DC | PRN

## 2020-08-14 NOTE — Telephone Encounter (Signed)
Requesting refill for Temazepam. 

## 2020-08-17 ENCOUNTER — Other Ambulatory Visit: Payer: Self-pay

## 2020-08-17 ENCOUNTER — Other Ambulatory Visit: Payer: Medicare Other | Admitting: Hospice

## 2020-08-17 DIAGNOSIS — Z515 Encounter for palliative care: Secondary | ICD-10-CM

## 2020-08-17 NOTE — Progress Notes (Signed)
[facility] Palliative Care Consult Note Telephone: [phone]  Fax: [phone]  PATIENT NAME: Maurice Reed [address] [place] [address] [phone] (home)  DOB: 1941-05-12 MRN: [id number]  PRIMARY CARE PROVIDER:    Inda Coke, [place],  [place] [address] [phone]  REFERRING PROVIDER:   Inda Coke, [address] [place],  [place] [address] [phone]  RESPONSIBLE PARTY:  Self [id number] [phone] - spouse - best to call  Extended Emergency Contact Information Primary Emergency Contact: Zayed, Griffie Mobile Phone: [phone] Relation: Spouse Secondary Emergency Contact: Perrin Maltese Mobile Phone: [phone] Relation: Granddaughter  I met face to face with patient and family in home/facility.  RECOMMENDATIONS/PLAN:    Visit at the request of Inda Coke, [place]  for palliative consult. Visit consisted of building trust and discussions on Palliative Medicine as specialized medical care for people living with serious illness, aimed at facilitating better quality of life through symptoms relief, assisting with advance care plan and establishing goals of care.   Palliative care and hospice have similar goals of managing symptoms, promoting comfort, improving quality of life, and maintaining a person's dignity. However, palliative care may be offered during any phase of a serious illness, while hospice care is usually offered when a person is expected to live for 6 months or less.  Advance Care Planning/Goals of Care: Goals include to maximize quality of life and symptom management. Our advance care planning conversation included a discussion about:    The value and importance of advance care Burgess reported Plan to formalize document for HCPOA with their local attorney. Experiences with loved ones who have been seriously ill or have died  Exploration of personal, cultural or  spiritual beliefs that might influence medical decisions  Exploration of goals of care in the event of a sudden injury or illness  Identification and preparation of a healthcare agent  Review and updating or creation of an  advance directive document  CODE STATUS: Discussed implications and ramifications of CODE STATUS.  Patient elected DO NOT RESUSCITATE.  DNR form signed for patient; same document uploaded to epic today.  GOALS OF CARE: Goals of care include to maximize quality of life and symptom management.  Patient hopes to get stronger, walk without his legs giving away, and become independent in his activities of daily living.  Patient is open to further discussion on medical orders for scope of treatment next visit.  He is also open to hospice services in the future.  Follow up Palliative Care Visit: Palliative care will continue to follow for goals of care clarification and symptom management.  Follow-up in a month for further discussions on goals of care clarification. Cognitive / Functional decline / Symptom Management: Patient's cognition remains intact at this time, alert and oriented to time, place and person.  In the past 6 months patient continues in functional decline as his legs continue to give away when he stands or walks around.  He reported he had 2 falls without injuries in the past month.  He is currently on oxygen 3 to 4 L/min and experiences shortness of breath on mild to moderate exertion.  PCP recently ordered transport chair to ensure patient's mobility and safety; spouse no longer able to move wheelchair because of her on back issues. Ongoing complaint of dizziness, tingling/burning in lower extremities and occasional jerking of his body.  Patient recently saw PCP who referred him to neurologist.Neurologist for body  jerkiness.  MRI of brain and entire body was recently done to pinpoint a reason for his legs giving away, burning/tingling from bil knees down.Patient is scheduled  to see neurologist this Thursday.  He is currently on oxycodone 77m BID for pain.  He is compliant with his breathing treatments, and continues on Eliquis for Afib, Torsemide for CHF and adhers to fluid restriction. Palliative will continue to monitor for symptom management/decline and make recommendations as needed.  Family /Caregiver/Community Supports: Patient enjoys daycare and support of his spouse, children and grandchildren.  Strong family support network identified.  I spent 1 hour and 46 minutes providing this consultation; time iincludes time spent with patient/family, chart review, provider coordination,  and documentation. More than 50% of the time in this consultation was spent on coordinating communication  CHIEF COMPLAIN/HISTORY OF PRESENT ILLNESS:  Maurice Reed a 79y.o. male with multiple medical problems including COPD, [name], hypertension,. Palliative Care was asked to follow this patient by consultation request of WInda Coke PUtahto help address advance care planning and goals of care. This is a follow up visit.  CODE STATUS: DO NOT RESUSCITATE  PPS: 50%  HOSPICE ELIGIBILITY/DIAGNOSIS: TBD  PAST MEDICAL HISTORY:  Past Medical History:  Diagnosis Date  . Arthritis   . Atrial fibrillation ([place]   . 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 Diverticula, bladder   . Erectile dysfunction   . GERD (gastroesophageal reflux disease)   . Kidney stones   . Obesity   . Pulmonary embolism ([place]     SOCIAL HX:  Social History   Tobacco Use  . Smoking status: Former Smoker    Packs/day: 3.00    Years: 25.00    Pack years: 75.00    Types: Cigarettes    Quit date: 11/28/1984    Years since quitting: 35.7  . Smokeless tobacco: Never Used  Substance Use Topics  . Alcohol use: Never   FAMILY HX:  Family History  Problem Relation Age of Onset  . Arthritis Mother   . Hearing loss Mother   . Hypertension Mother   . Heart disease Mother   .  Stroke Mother   . Arthritis Father   . Hearing loss Father   . Heart disease Father   . Hypertension Father   . Heart attack Father   . Kidney disease Father   . Stroke Father     ALLERGIES:  Allergies  Allergen Reactions  . Other Itching    Cats: watery eyes, itching, sneezing     PERTINENT MEDICATIONS:  Outpatient Encounter Medications as of 08/17/2020  Medication Sig  . Ascorbic Acid (VITAMIN C) 500 MG CAPS Take 1 capsule by mouth daily.  . Atropine Sulfate-NaCl 0.01-0.9 % SOLN Apply 1 drop to eye at bedtime.   . Cholecalciferol (VITAMIN D3) 3000 units TABS Take by mouth.  . clobetasol (TEMOVATE) 0.05 % external solution APPLY TO AFFECTED AREA(S)  ON SCALP TOPICALLY ONE TO  TWO TIMES A DAY AS NEEDED  . desonide (DESOWEN) 0.05 % lotion Apply topically.  . docusate sodium (COLACE) 100 MG capsule Take 100 mg by mouth daily.   .Marland Kitchendoxycycline (VIBRA-TABS) 100 MG tablet Take 1 tablet (100 mg total) by mouth 2 (two) times daily.  .Marland KitchenELIQUIS 5 MG TABS tablet TAKE 1 TABLET BY MOUTH  TWICE DAILY  . erythromycin ophthalmic ointment APP THIN LAYER IN OS BID  . ipratropium-albuterol (DUONEB) 0.5-2.5 (3) MG/3ML SOLN Take  3 mLs by nebulization every 6 (six) hours as needed.  . magnesium oxide (MAG-OX) 400 MG tablet Take 1 tablet (400 mg total) by mouth 2 (two) times daily.  . metoprolol succinate (TOPROL-XL) 25 MG 24 hr tablet TAKE 1 TABLET BY MOUTH  DAILY  . mometasone (NASONEX) 50 MCG/ACT nasal spray Place 2 sprays into the nose daily. (Patient taking differently: Place 2 sprays into the nose as needed. )  . NARCAN 4 MG/0.1ML LIQD nasal spray kit INSTILL ONE SPRAY PRN FOR ACCIDENTAL OVERDOSE  . oxyCODONE (OXY IR/ROXICODONE) 5 MG immediate release tablet Take 5 mg by mouth 2 (two) times daily as needed. for pain  . potassium chloride (MICRO-K) 10 MEQ CR capsule TAKE 2 CAPSULES BY MOUTH IN THE AM AND 1 CAPSULE IN THE EVENING  . prednisoLONE acetate (PRED FORTE) 1 % ophthalmic suspension Place 1  drop into the left eye 2 (two) times daily.   . tadalafil (CIALIS) 20 MG tablet Take 0.5-1 tablets (10-20 mg total) by mouth every other day as needed for erectile dysfunction.  . Tafluprost, PF, 0.0015 % SOLN Apply 1 drop to eye daily. Drop 1 drop into right at night  . tamsulosin (FLOMAX) 0.4 MG CAPS capsule TAKE 1 CAPSULE BY MOUTH AT  BEDTIME  . temazepam (RESTORIL) 15 MG capsule Take 1 capsule (15 mg total) by mouth at bedtime as needed.  . torsemide (DEMADEX) 20 MG tablet TAKE 2 TABLETS BY MOUTH IN THE AM AND 1 TAB IN THE EVENING  . triamcinolone ointment (KENALOG) 0.5 % APPLY TO AFFECTED AREA(S)  TOPICALLY 1 TO 2 TIMES  DAILY   No facility-administered encounter medications on file as of 08/17/2020.    PHYSICAL EXAM / ROS: Current weight 273 pounds height  is 5 feet 10 inches  General: NAD,  Left eye blindness, cooperative Cardiovascular: regular rate and rhythm, no chest pain reported Pulmonary: no cough, no shortness of breath, clear ant/post fields, normal respiratory effort; on oxygen supplementation 4 L/min Abdomen: soft, non tender, positive bowel sounds in all quadrants GU: denies dysuria, no suprapubic tenderness Extremities: no edema, no joint deformities Skin: no rashes to exposed skin Neurological: Weakness but otherwise non focal  Teodoro Spray, NP

## 2020-08-20 ENCOUNTER — Encounter: Payer: Self-pay | Admitting: Neurology

## 2020-08-20 ENCOUNTER — Other Ambulatory Visit: Payer: Self-pay

## 2020-08-20 ENCOUNTER — Ambulatory Visit: Payer: Medicare Other | Admitting: Neurology

## 2020-08-20 VITALS — BP 143/83 | HR 107 | Ht 70.0 in | Wt 270.0 lb

## 2020-08-20 DIAGNOSIS — G629 Polyneuropathy, unspecified: Secondary | ICD-10-CM | POA: Diagnosis not present

## 2020-08-20 DIAGNOSIS — R29898 Other symptoms and signs involving the musculoskeletal system: Secondary | ICD-10-CM

## 2020-08-20 DIAGNOSIS — G959 Disease of spinal cord, unspecified: Secondary | ICD-10-CM | POA: Diagnosis not present

## 2020-08-20 NOTE — Progress Notes (Signed)
[facility] Clinic Note - Initial Visit   Date: 08/20/20  FAYE SANFILIPPO MRN: [id number] DOB: Nov 26, 1941   Dear Inda Coke, PA:  Thank you for your kind referral of KRUZ CHIU for consultation of bilateral leg weakness. Although his history is well known to you, please allow [place] to reiterate it for the purpose of our medical record. The patient was accompanied to the clinic by wife who also provides collateral information.     History of Present Illness: THEODUS RAN is a 79 y.o. right-handed male with atrial fibrillation, hypertension, edema, remote PE, prior tobacco use, COPD on 4-5L oxygen, BiPAP, interstitial lung disease, and skin abscenss presenting for evaluation of bilateral leg weakness.   He was diagnosed with neuropathy about 3 years ago.  He has numbness/tingling from the knees down into his feet as well as fingertips.  He has imbalance for the past 9 months.  Starting around June, he began having heavy sensation of the legs and cold sensation.  He also has buckling sensation of the legs.  He started using a cane 6 months ago and has been using a walker for the past 2 months.  He has fallen about 5 times over the past year.  He denies incontinence.  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  He has recently be referred to palliative care for pulmonary and cardiac comorbidities.  He is on 3-5L oxyen has home.     Out-side paper records, electronic medical record, and images have been reviewed where available and summarized as:  MRI brain wo contrast 07/25/2020: No acute intracranial finding. Moderate chronic small-vessel ischemic changes of the cerebral hemispheric white matter.  MRI cervical spine wo contrast 07/25/2020: No primary cord pathology evident in the cervical region.  Degenerative cervical  spondylosis and facet osteoarthritis as above. 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 Effacement of the subarachnoid space with slight indentation of cord, but no abnormal cord signal. Potential for neural foraminal compression at multiple levels as outlined above.  MRI thoracic spine 07/25/20: Increased T2 signal within the central thoracic cord, moderate in degree but fairly convincing particularly in the mid to lower thoracic region. Question ischemic insult.  Posterior element hypertrophic change at T9-10 and T10-11. On the right at T10-11, there is slight indentation of the dorsal cord, though subarachnoid space does continue to surround the cord.  MRI lumbar spine 07/25/2020: The distal thoracic cord at the T12 level and the conus tip at L1 appear normal on this exam.  Disc bulges and facet osteoarthritis at L3-4, L4-5 and L5-S1 but without evidence of compressive stenosis. Some facet edema at L4-5 could be associated with back pain.  Lab Results  Component Value Date   HGBA1C 5.9 (H) 07/22/2020   Lab Results  Component Value Date   VITAMINB12 325 07/22/2020   Lab Results  Component Value Date   TSH 2.77 07/22/2020   Lab Results  Component Value Date   ESRSEDRATE 31 (H) 07/24/2020    Past Medical History:  Diagnosis Date  . Arthritis   . Atrial fibrillation ([place])   . 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 Diverticula, bladder   . Erectile dysfunction   . GERD (gastroesophageal reflux disease)   . Kidney stones   .  Obesity   . Pulmonary embolism [facility])     Past Surgical History:  Procedure Laterality Date  . APPENDECTOMY  1963  . LITHOTRIPSY  1987  . NOSE SURGERY  2003  . TONSILLECTOMY AND ADENOIDECTOMY  1948  . UMBILICAL HERNIA REPAIR  1984     Medications:  Outpatient Encounter Medications as of 08/20/2020  Medication Sig Note  . Ascorbic Acid (VITAMIN C) 500 MG CAPS Take 1 capsule by mouth daily.    . Atropine Sulfate-NaCl 0.01-0.9 % SOLN Apply 1 drop to eye at bedtime.    . Cholecalciferol (VITAMIN D3) 3000 units TABS Take by mouth.   . clobetasol (TEMOVATE) 0.05 % external solution APPLY TO AFFECTED AREA(S)  ON SCALP TOPICALLY ONE TO  TWO TIMES A DAY AS NEEDED   . desonide (DESOWEN) 0.05 % lotion Apply topically.   . docusate sodium (COLACE) 100 MG capsule Take 100 mg by mouth daily.    Marland Kitchen doxycycline (VIBRA-TABS) 100 MG tablet Take 1 tablet (100 mg total) by mouth 2 (two) times daily.   Marland Kitchen ELIQUIS 5 MG TABS tablet TAKE 1 TABLET BY MOUTH  TWICE DAILY   . erythromycin ophthalmic ointment APP THIN LAYER IN OS BID   . ipratropium-albuterol (DUONEB) 0.5-2.5 (3) MG/3ML SOLN Take 3 mLs by nebulization every 6 (six) hours as needed.   . magnesium oxide (MAG-OX) 400 MG tablet Take 1 tablet (400 mg total) by mouth 2 (two) times daily.   . metoprolol succinate (TOPROL-XL) 25 MG 24 hr tablet TAKE 1 TABLET BY MOUTH  DAILY   . mometasone (NASONEX) 50 MCG/ACT nasal spray Place 2 sprays into the nose daily. (Patient taking differently: Place 2 sprays into the nose as needed. )   . NARCAN 4 MG/0.1ML LIQD nasal spray kit INSTILL ONE SPRAY PRN FOR ACCIDENTAL OVERDOSE 03/06/2019: prn  . oxyCODONE (OXY IR/ROXICODONE) 5 MG immediate release tablet Take 5 mg by mouth 2 (two) times daily as needed. for pain   . potassium chloride (MICRO-K) 10 MEQ CR capsule TAKE 2 CAPSULES BY MOUTH IN THE AM AND 1 CAPSULE IN THE EVENING   . prednisoLONE acetate (PRED FORTE) 1 % ophthalmic suspension Place 1 drop into the left eye 2 (two) times daily.    . tadalafil (CIALIS) 20 MG tablet Take 0.5-1 tablets (10-20 mg total) by mouth every other day as needed for erectile dysfunction.   . Tafluprost, PF, 0.0015 % SOLN Apply 1 drop to eye daily. Drop 1 drop into right at night   . tamsulosin (FLOMAX) 0.4 MG CAPS capsule TAKE 1 CAPSULE BY MOUTH AT  BEDTIME   . temazepam (RESTORIL) 15 MG capsule Take 1 capsule (15 mg total) by mouth at  bedtime as needed.   . torsemide (DEMADEX) 20 MG tablet TAKE 2 TABLETS BY MOUTH IN THE AM AND 1 TAB IN THE EVENING   . triamcinolone ointment (KENALOG) 0.5 % APPLY TO AFFECTED AREA(S)  TOPICALLY 1 TO 2 TIMES  DAILY    No facility-administered encounter medications on file as of 08/20/2020.    Allergies:  Allergies  Allergen Reactions  . Other Itching    Cats: watery eyes, itching, sneezing    Family History: Family History  Problem Relation Age of Onset  . Arthritis Mother   . Hearing loss Mother   . Hypertension Mother   . Heart disease Mother   . Stroke Mother   . Arthritis Father   . Hearing loss Father   . Heart disease Father   .  Hypertension Father   . Heart attack Father   . Kidney disease Father   . Stroke Father     Social History: Social History   Tobacco Use  . Smoking status: Former Smoker    Packs/day: 3.00    Years: 25.00    Pack years: 75.00    Types: Cigarettes    Quit date: 11/28/1984    Years since quitting: 35.7  . Smokeless tobacco: Never Used  Vaping Use  . Vaping Use: Never used  Substance Use Topics  . Alcohol use: Never  . Drug use: Yes    Types: Marijuana    Comment: Gummies    Social History   Social History Narrative   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39)   Married to [employer] ([name] Lamar patient)   7 children   Currently lives with son in [place], [place]; all their things are in storage; plan to travel and stay with kids but keep their home base in the [place] area   Right Handed    Vital Signs:  BP (!) 143/83   Pulse (!) 107   Ht '5\' 10"'  (1.778 m)   Wt 270 lb (122.5 kg)   SpO2 94%   BMI 38.74 kg/m    General Medical Exam:   General:  Sitting in wheelchair, comfortable.  He is on oxygen via nasal canula  Neurological Exam: MENTAL STATUS including orientation to time, place, person, recent and remote memory, attention span and concentration, language, and fund of knowledge is  normal.  Speech is not dysarthric.  CRANIAL NERVES: II:  No visual field defects.   III-IV-VI: Pupils equal round and reactive on the right, blind in the left eye.  Normal conjugate, extra-ocular eye movements in all directions of gaze.  No nystagmus.  No ptosis.   VII:  Normal facial symmetry and movements.   VIII:  Normal hearing and vestibular function.   IX-X:  Normal palatal movement.   XI:  Normal shoulder shrug and head rotation.   XII:  Normal tongue strength and range of motion, no deviation or fasciculation.  MOTOR:  No atrophy, fasciculations or abnormal movements.  No pronator drift.   Upper Extremity:  Right  Left  Deltoid  5/5   5/5   Biceps  5/5   5/5   Triceps  5/5   5/5   Infraspinatus 5/5  5/5  Medial pectoralis 5/5  5/5  Wrist extensors  5/5   5/5   Wrist flexors  5/5   5/5   Finger extensors  5/5   5/5   Finger flexors  5/5   5/5   Dorsal interossei  5/5   5/5   Abductor pollicis  5/5   5/5   Tone (Ashworth scale)  0  0   Lower Extremity:  Right  Left  Hip flexors  5/5   5/5   Hip extensors  5/5   5/5   Adductor 5/5  5/5  Abductor 5/5  5/5  Knee flexors  5/5   5/5   Knee extensors  5/5   5/5   Dorsiflexors  5/5   5/5   Plantarflexors  5/5   5/5   Toe extensors  5/5   5/5   Toe flexors  5/5   5/5   Tone (Ashworth scale)  0  0   MSRs:  Right        Left  brachioradialis 2+  2+  biceps 2+  2+  triceps 2+  2+  patellar 2+  2+  ankle jerk 2+  2+  Hoffman no  no  plantar response down  down   SENSORY:  Gradient pattern of sensory loss in the lower legs and feet to vibration, pin prick, and temperature.    COORDINATION/GAIT: Normal finger-to- nose-finger.  Intact rapid alternating movements bilaterally. Gait not tested  IMPRESSION: Bilateral leg weakness, mulitfactorial.  There is a long segment of thoracic hyperintensity in the cord which may represent ischemia.  My hesitation with this is that he is on anticoagulation therapy.   With that said, it does sound like his sensation of weakness started suddenly.  Another layer adding to his weakness is cervical canal stenosis, though I would expect weakness in the arms. Doubt this is an autoimmune/inflammatory process.  I had a long discussion with patient and wife about additional diagnostic testing such as MRI with contrast, labs, and spinal tap, and ultimately decided only to proceed with labs, since he would not be a candidate for lumbar puncture and results of MRI would not change management.  His overall health is poor and has transitioned into palliative care. Check copper, folate, and MMA as these are easily treatable causes for myeopathy.  I will send home PT referral to help with leg strengthening and balance, as management is supportive only.      Thank you for allowing me to participate in patient's care.  If I can answer any additional questions, I would be pleased to do so.    Sincerely,    Soniyah Mcglory K. Posey Pronto, DO

## 2020-08-20 NOTE — Patient Instructions (Signed)
Check labs  Home physical therapy referral

## 2020-08-21 ENCOUNTER — Ambulatory Visit (INDEPENDENT_AMBULATORY_CARE_PROVIDER_SITE_OTHER): Payer: Medicare Other | Admitting: Pulmonary Disease

## 2020-08-21 ENCOUNTER — Telehealth: Payer: Self-pay

## 2020-08-21 ENCOUNTER — Encounter: Payer: Self-pay | Admitting: Pulmonary Disease

## 2020-08-21 VITALS — BP 144/62 | HR 92 | Temp 97.3°F | Ht 70.0 in | Wt 274.8 lb

## 2020-08-21 DIAGNOSIS — I4811 Longstanding persistent atrial fibrillation: Secondary | ICD-10-CM

## 2020-08-21 DIAGNOSIS — G4733 Obstructive sleep apnea (adult) (pediatric): Secondary | ICD-10-CM

## 2020-08-21 DIAGNOSIS — J849 Interstitial pulmonary disease, unspecified: Secondary | ICD-10-CM

## 2020-08-21 DIAGNOSIS — J9611 Chronic respiratory failure with hypoxia: Secondary | ICD-10-CM | POA: Diagnosis not present

## 2020-08-21 DIAGNOSIS — Z0000 Encounter for general adult medical examination without abnormal findings: Secondary | ICD-10-CM

## 2020-08-21 DIAGNOSIS — J929 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: Secondary | ICD-10-CM | POA: Diagnosis not present

## 2020-08-21 NOTE — Assessment & Plan Note (Addendum)
Continue oxygen therapy Referral to hospice

## 2020-08-21 NOTE — Assessment & Plan Note (Signed)
Plan: Continue oxygen therapy 

## 2020-08-21 NOTE — Assessment & Plan Note (Signed)
Continue BiPAP.  

## 2020-08-21 NOTE — Assessment & Plan Note (Signed)
Suspected that interstitial lung disease is progressed Patient has declined antifibrotic's in the past  Plan: Referral to hospice Continue oxygen therapy

## 2020-08-21 NOTE — Patient Instructions (Addendum)
You were seen today by Coral Ceo, NP  for:   1. ILD (interstitial lung disease) (HCC) 2. OSA treated with BiPAP 3. 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 Chronic respiratory failure with hypoxia (HCC)  We will refer you to hospice today  We recommend that you continue using your BIPAP daily >>>Keep up the hard work using your device >>> Goal should be wearing this for the entire night that you are sleeping, at least 4 to 6 hours  Remember:  . Do not drive or operate heavy machinery if tired or drowsy.  . Please notify the supply company and office if you are unable to use your device regularly due to missing supplies or machine being broken.  . Work on maintaining a healthy weight and following your recommended nutrition plan  . Maintain proper daily exercise and movement  . Maintaining proper use of your device can also help improve management of other chronic illnesses such as: Blood pressure, blood sugars, and weight management.   BiPAP/ CPAP Cleaning:  >>>Clean weekly, with Dawn soap, and bottle brush.  Set up to air dry. >>> Wipe mask out daily with wet wipe or towelette     5. Longstanding persistent atrial fibrillation (HCC)  Continue follow-up with primary care and cardiology  6. Healthcare maintenance  Patient will receive flu vaccine from primary care   We recommend today:  Orders Placed This Encounter  Procedures  . Flu vaccine HIGH DOSE PF (Fluzone High dose)  . Ambulatory referral to Hospice    Referral Priority:   Routine    Referral Type:   Consultation    Referral Reason:   Specialty Services Required    Requested Specialty:   Hospice Services    Number of Visits Requested:   1   Orders Placed This Encounter  Procedures  . Flu vaccine HIGH DOSE PF (Fluzone High dose)  . Ambulatory referral to Hospice   No orders of the defined types were placed in this encounter.   Follow Up:    Return in about 3 months (around 11/20/2020), or if symptoms worsen  or fail to improve, for Follow up with Dr. Vassie Loll.   Notification of test results are managed in the following manner: If there are  any recommendations or changes to the  plan of care discussed in office today,  we will contact you and let you know what they are. If you do not hear from [place], then your results are normal and you can view them through your  MyChart account , or a letter will be sent to you. Thank you again for trusting [place] with your care  - Thank you, [facility]    It is flu season:   >>> Best ways to protect herself from the flu: Receive the yearly flu vaccine, practice good hand hygiene washing with soap and also using hand sanitizer when available, eat a nutritious meals, get adequate rest, hydrate appropriately       Please contact the office if your symptoms worsen or you have concerns that you are not improving.   Thank you for choosing [facility] for your healthcare, and for allowing [place] to partner with you on your healthcare journey. I am thankful to be able to provide care to you today.   Elisha Headland FNP-C

## 2020-08-21 NOTE — Assessment & Plan Note (Signed)
Plan: Continue follow-up with cardiology Continue follow-up with primary care Continue Eliquis

## 2020-08-21 NOTE — Assessment & Plan Note (Signed)
Encouraged COVID-19 vaccination, patient declined Recommend seasonal flu vaccine in fall/2021, patient plans to obtain this from primary care

## 2020-08-21 NOTE — Telephone Encounter (Signed)
[facility] would like to know if Maurice Reed would be his attending.. Please call number added

## 2020-08-21 NOTE — Progress Notes (Signed)
'@Patient'  ID: Maurice Reed, male    DOB: 1941/11/11, 79 y.o.   MRN: [id number]  Chief Complaint  Patient presents with  . Follow-up    pt is here for bipap compliance.pt has had a couple of falls and fatigue and he is inpalliative care    Referring provider: Inda Coke, [place]  HPI:  79 year old male former smoker initially referred to our office on 05/29/2018 to be established with a local pulmonologist for management of COPD as well as obstructive sleep apnea. Patient is currently on BiPAP. Chronic respiratory failure. Also history of asbestosis  Past medical history: History of DVT and PE in 2002, 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 75-pack-year smoking history. Maintenance: None Patient of Dr. [name]  08/21/2020  - Visit   79 year old male former smoker followed in our office for interstitial lung disease, chronic respiratory failure and obstructive sleep apnea managed on BiPAP.  He is followed by Dr. [name].  Patient was last seen by Dr. [name] in June/2021.  Plan of care at that office visit was as follows: Encourage BiPAP use, it was recommended that he participate in pulmonary rehab but unfortunately patient has no way to obtain transportation, it was discussed whether or not he would benefit from starting antifibrotic's, shared decision making visit took place and patient declined starting antifibrotic's.  Patient presenting today with his spouse.  Reporting that breathing has worsened.  He is maintained on 3 to 4 L continuous with physical exertion.  4 to 5 L pulsed O2 via POC with physical exertion.  He recently established with palliative care.  They are unsure whether or not he could qualify for hospice.  We will discuss this today.  Patient continues to decline antifibrotic's.  Patient is also not vaccinated for COVID-19.  He declines this recommendation today.  He is agreeable to receive  the flu vaccine from primary care.  Patient continues to have persistent dyspnea.  He attributes this to his likely progression of his interstitial lung disease, sedentary lifestyle, chronic respiratory failure as well as persistent [name].  He remains adherent to using his BiPAP.  Patient reporting today that he is completed a DNR form.  They are also working on obtaining healthcare power of attorney information.  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 Features of asbestosis areNOTidentified.   01/2018 titration >>bipap 18/12   11/2017 spiro ratio 86, FEV 1 45%, FVC 39 % , severe restriction ABG 12/2017 7.42/45/82/ 97% RA  PFT 02/2019 no airway obstruction, ratio 86, FEV1 60%, F VC 50%, no bronchodilator response, TLC 48%, DLCO 66% , corrects for alveolar volume-moderate intraparenchymal restriction  Echo 07/2018 RVSP 57  09/17/2019-CT chest high-res-calcified pleural plaques bilaterally, compatible with asbestosis related pleural disease, there are findings compatible with interstitial lung disease with spectrum of findings indeterminate for UIP, dilatation of pulmonary trunk concerning for associated pulmonary arterial hypertension  03/15/2019-echocardiogram-LV ejection fraction 60 to 65%, right ventricle has normal systolic function, left atrial size mildly dilated, mild thickening of the mitral valve leaflet, mild calcification of mitral valve leaflet, aortic valve has indeterminate number of cusp, severe thickening of the aortic valve, sclerosis without any evidence of stenosis in the aortic valve   FENO:  No results found for: NITRICOXIDE  PFT: PFT Results Latest Ref Rng & Units 02/14/2019  FVC-Pre L 2.05  FVC-Predicted Pre %  50  FVC-Post L 1.95  FVC-Predicted Post % 47  Pre FEV1/FVC % % 86  Post FEV1/FCV % % 86  FEV1-Pre L 1.76  FEV1-Predicted Pre % 60   FEV1-Post L 1.69  DLCO uncorrected ml/min/mmHg 16.35  DLCO UNC% % 66  DLCO corrected ml/min/mmHg 16.31  DLCO COR %Predicted % 66  DLVA Predicted % 146  TLC L 3.37  TLC % Predicted % 48  RV % Predicted % 56    WALK:  SIX MIN WALK 12/23/2019 08/16/2019 01/24/2019 05/29/2018  Supplimental Oxygen during Test? (L/min) Yes No No No  O2 Flow Rate 2 - - -  Type Continuous - - -  Tech Comments: Patient was able to complete 1 lap at a slow pace. Patient had to walk and hold onto the wall during walk for his balance. Halfway into lap he stated that he felt lightheaded but wanted to continue the walk. He was walked on 2L of O2. Did have some SOB but no chest or leg pain. Lightheadedness went away after patient was able to sit for a few minutes. Walked at moderate pace tolerated with no c/o of dizziness or shorness of breath. Discontinued walk due to elevated heart rate. moderate SOB upon return to room fast paced walk no SOb noted.    Imaging: MR Brain Wo Contrast  Result Date: 07/25/2020 CLINICAL DATA:  Bilateral leg weakness.  Motor neuron disease. EXAM: MRI HEAD WITHOUT CONTRAST TECHNIQUE: Multiplanar, multiecho pulse sequences of the brain and surrounding structures were obtained without intravenous contrast. COMPARISON:  None. FINDINGS: Brain: Diffusion imaging does not show any acute or subacute infarction. No focal abnormality affects the brainstem or cerebellum. Cerebral hemispheres show age related volume loss with moderate chronic small-vessel ischemic changes of the white matter. No cortical or large vessel territory infarction. No mass lesion, hemorrhage, hydrocephalus or extra-axial collection. Vascular: Major vessels at the base of the brain show flow. Skull and upper cervical spine: Negative Sinuses/Orbits: Clear sinuses.  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 Other: None IMPRESSION: No acute intracranial finding. Moderate chronic small-vessel ischemic changes of the cerebral hemispheric white  matter. Electronically Signed   By: Nelson Chimes M.D.   On: 07/25/2020 14:49   MR Cervical Spine Wo Contrast  Result Date: 07/25/2020 CLINICAL DATA:  Bilateral leg weakness.  Motor neuron disease. EXAM: MRI CERVICAL SPINE WITHOUT CONTRAST TECHNIQUE: Multiplanar, multisequence MR imaging of the cervical spine was performed. No intravenous contrast was administered. COMPARISON:  None. FINDINGS: Alignment: Mild straightening of the normal cervical lordosis. Vertebrae: No fracture or primary bone lesion. Cord: No primary cord lesion.  See below regarding stenosis. Posterior Fossa, vertebral arteries, paraspinal tissues: Negative Disc levels: Foramen magnum is widely patent.  C1-2 is negative. C2-3: Minimal disc bulge. Mild facet osteoarthritis. No compressive stenosis. C3-4: Endplate osteophytes and bulging of the disc. Canal stenosis with AP diameter in the midline of 9.2 mm. No cord compression. Bilateral facet and uncovertebral osteophytes. Bilateral foraminal narrowing could affect either C4 nerve. C4-5: Endplate osteophytes and mild bulging of the disc. Facet osteoarthritis on the right. No compressive canal stenosis. Mild foraminal narrowing right more than left but no definite neural compression. C5-6: Spondylosis with endplate osteophytes and bulging of the disc. Narrowing of the canal. Effacement of the subarachnoid space. AP diameter in the midline 8 mm. Slight indentation of the cord. No abnormal cord signal. Mild bilateral foraminal stenosis. C6-7: Endplate osteophytes and disc protrusion more prominent to the left of midline. Effacement of the subarachnoid space.  AP diameter of the canal in the midline 7.6 mm. Indentation of the ventral cord. No abnormal cord signal. No foraminal stenosis. C7-T1: Normal interspace. IMPRESSION: No primary cord pathology evident in the cervical region. 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 Canal stenosis with AP diameter of the canal at C5-6  8 mm and at C6-7 7.6 mm. Effacement of the subarachnoid space with slight indentation of cord, but no abnormal cord signal. Potential for neural foraminal compression at multiple levels as outlined above. Electronically Signed   By: Nelson Chimes M.D.   On: 07/25/2020 15:00   MR Thoracic Spine Wo Contrast  Result Date: 07/25/2020 CLINICAL DATA:  Bilateral leg weakness.  Motor neuron disease. EXAM: MRI THORACIC SPINE WITHOUT CONTRAST TECHNIQUE: Multiplanar, multisequence MR imaging of the thoracic spine was performed. No intravenous contrast was administered. COMPARISON:  CT 12/17/2019 FINDINGS: Alignment:  Normal Vertebrae: No fracture or primary bone lesion. Cord: Slightly increased T2 signal affecting the central cord, more prominent towards the lower thoracic region. This suggests a central cord syndrome. Paraspinal and other soft tissues: Negative Disc levels: No disc pathology. Facet and ligamentous hypertrophy at T9-10 and T10-11, indenting the dorsal thecal sac. At T10-11, this appears to deform the right dorsal cord slightly. Subarachnoid space does continue to surround the cord. IMPRESSION: Increased T2 signal within the central thoracic cord, moderate in degree but fairly convincing particularly in the mid to lower thoracic region. Question ischemic insult. Posterior element hypertrophic change at T9-10 and T10-11. On the right at T10-11, there is slight indentation of the dorsal cord, though subarachnoid space does continue to surround the cord. Electronically Signed   By: Nelson Chimes M.D.   On: 07/25/2020 14:56   MR Lumbar Spine Wo Contrast  Result Date: 07/25/2020 CLINICAL DATA:  Bilateral leg weakness.  Motor neuron disease. EXAM: MRI LUMBAR SPINE WITHOUT CONTRAST TECHNIQUE: Multiplanar, multisequence MR imaging of the lumbar spine was performed. No intravenous contrast was administered. COMPARISON:  Thoracic study earlier today. FINDINGS: Segmentation:  5 lumbar type vertebral bodies.  Alignment:  Normal Vertebrae:  No fracture or primary lesion. Conus medullaris and cauda equina: Conus extends to the L1 level. Conus and cauda equina appear normal. I do not see abnormal signal in the distal cord at the T12 level or at the conus tip. Paraspinal and other soft tissues: Negative Disc levels: No abnormality at L1-2 or L2-3. L3-4: Bulging of the disc. Mild facet hypertrophy. Mild multifactorial stenosis without distinct neural compression. L4-5: Bulging of the disc. Facet and ligamentous hypertrophy with some edema. Mild narrowing of the canal and lateral recesses without distinct neural compression. The facet arthropathy could be a cause of back pain. L5-S1: Minimal disc bulge. No canal or foraminal stenosis. Diminutive thecal sac at this level. IMPRESSION: The distal thoracic cord at the T12 level and the conus tip at L1 appear normal on this exam. Disc bulges and facet osteoarthritis at L3-4, L4-5 and L5-S1 but without evidence of compressive stenosis. Some facet edema at L4-5 could be associated with back pain. Electronically Signed   By: Nelson Chimes M.D.   On: 07/25/2020 15:07    Lab Results:  CBC    Component Value Date/Time   WBC 6.8 07/22/2020 1201   RBC 4.03 (L) 07/22/2020 1201   HGB 13.9 07/22/2020 1201   HCT 41.5 07/22/2020 1201   PLT 277 07/22/2020 1201   MCV 103.0 (H) 07/22/2020 1201   MCH 34.5 (H) 07/22/2020 1201   MCHC 33.5 07/22/2020  1201   RDW 12.1 07/22/2020 1201   LYMPHSABS 1,618 07/22/2020 1201   MONOABS 0.5 12/17/2019 1630   EOSABS 88 07/22/2020 1201   BASOSABS 20 07/22/2020 1201    BMET    Component Value Date/Time   NA 141 07/22/2020 1201   NA 143 05/22/2020 1142   K 4.2 07/22/2020 1201   CL 98 07/22/2020 1201   CO2 35 (H) 07/22/2020 1201   GLUCOSE 102 (H) 07/22/2020 1201   BUN 16 07/22/2020 1201   BUN 11 05/22/2020 1142   CREATININE 0.90 07/22/2020 1201   CALCIUM 9.5 07/22/2020 1201   GFRNONAA 80 05/22/2020 1142   GFRAA 92 05/22/2020 1142     BNP    Component Value Date/Time   BNP 39.5 12/19/2019 1634   BNP 65.0 12/17/2019 1630   BNP 67 12/13/2019 1512    ProBNP No results found for: PROBNP  Specialty Problems      Pulmonary Problems   OSA treated with BiPAP    01/23/2018 titration >> bipap 18/12  >>> controlled on 14/8 on download       ILD (interstitial lung disease) ([place])    Former smoker 1958-85. Worked in [employer] all his life. 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 Other Itching    Cats: watery eyes, itching, sneezing    Immunization History  Administered Date(s) Administered  . Fluad Quad(high Dose 65+) 08/23/2019  . Influenza, High Dose Seasonal PF 09/21/2018  . Influenza, Seasonal, Injecte, Preservative Fre 09/28/2017  . Pneumococcal Polysaccharide-23 12/14/2015     Past Medical History:  Diagnosis Date  . Arthritis   . Atrial fibrillation ([place])   . 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 Diverticula, bladder   . Erectile dysfunction   . GERD (gastroesophageal reflux disease)   . Kidney stones   . Obesity   . Pulmonary embolism (HCC)     Tobacco History: Social History   Tobacco Use  Smoking Status Former Smoker  . Packs/day: 3.00  . Years: 25.00  . Pack years: 75.00  . Types: Cigarettes  . Quit date: 11/28/1984  . Years since quitting: 35.7  Smokeless Tobacco Never  Used   Counseling given: Not Answered   Continue to not smoke  Outpatient Encounter Medications as of 08/21/2020  Medication Sig  . Ascorbic Acid (VITAMIN C) 500 MG CAPS Take 1 capsule by mouth daily.  . Atropine Sulfate-NaCl 0.01-0.9 % SOLN Apply 1 drop to eye at bedtime.   . Cholecalciferol (VITAMIN D3) 3000 units TABS Take by mouth.  . clobetasol (TEMOVATE) 0.05 % external solution APPLY TO AFFECTED AREA(S)  ON SCALP TOPICALLY ONE TO  TWO TIMES A DAY AS NEEDED  . desonide (DESOWEN) 0.05 % lotion Apply topically.  . docusate sodium (COLACE) 100 MG capsule Take 100 mg by mouth daily.   Marland Kitchen doxycycline (VIBRA-TABS) 100 MG tablet Take 1 tablet (100 mg total) by mouth 2 (two) times daily.  Marland Kitchen  ELIQUIS 5 MG TABS tablet TAKE 1 TABLET BY MOUTH  TWICE DAILY  . erythromycin ophthalmic ointment APP THIN LAYER IN OS BID  . ipratropium-albuterol (DUONEB) 0.5-2.5 (3) MG/3ML SOLN Take 3 mLs by nebulization every 6 (six) hours as needed.  . magnesium oxide (MAG-OX) 400 MG tablet Take 1 tablet (400 mg total) by mouth 2 (two) times daily.  . metoprolol succinate (TOPROL-XL) 25 MG 24 hr tablet TAKE 1 TABLET BY MOUTH  DAILY  . mometasone (NASONEX) 50 MCG/ACT nasal spray Place 2 sprays into the nose daily. (Patient taking differently: Place 2 sprays into the nose as needed. )  . NARCAN 4 MG/0.1ML LIQD nasal spray kit INSTILL ONE SPRAY PRN FOR ACCIDENTAL OVERDOSE  . oxyCODONE (OXY IR/ROXICODONE) 5 MG immediate release tablet Take 5 mg by mouth 2 (two) times daily as needed. for pain  . potassium chloride (MICRO-K) 10 MEQ CR capsule TAKE 2 CAPSULES BY MOUTH IN THE AM AND 1 CAPSULE IN THE EVENING  . prednisoLONE acetate (PRED FORTE) 1 % ophthalmic suspension Place 1 drop into the left eye 2 (two) times daily.   . tadalafil (CIALIS) 20 MG tablet Take 0.5-1 tablets (10-20 mg total) by mouth every other day as needed for erectile dysfunction.  . Tafluprost, PF, 0.0015 % SOLN Apply 1 drop to eye daily. Drop 1 drop into  right at night  . tamsulosin (FLOMAX) 0.4 MG CAPS capsule TAKE 1 CAPSULE BY MOUTH AT  BEDTIME  . temazepam (RESTORIL) 15 MG capsule Take 1 capsule (15 mg total) by mouth at bedtime as needed.  . torsemide (DEMADEX) 20 MG tablet TAKE 2 TABLETS BY MOUTH IN THE AM AND 1 TAB IN THE EVENING  . triamcinolone ointment (KENALOG) 0.5 % APPLY TO AFFECTED AREA(S)  TOPICALLY 1 TO 2 TIMES  DAILY   No facility-administered encounter medications on file as of 08/21/2020.     Review of Systems  Review of Systems  Constitutional: Positive for activity change and fatigue. Negative for chills, fever and unexpected weight change.  HENT: Negative for postnasal drip, rhinorrhea, sinus pressure, sinus pain and sore throat.   Eyes: Negative.   Respiratory: Positive for shortness of breath. Negative for cough and wheezing.   Cardiovascular: Negative for chest pain and palpitations.  Gastrointestinal: Negative for constipation, diarrhea, nausea and vomiting.  Endocrine: Negative.   Genitourinary: Negative.   Musculoskeletal: Negative.   Skin: Negative.   Neurological: Negative for dizziness and headaches.  Psychiatric/Behavioral: Negative.  Negative for dysphoric mood. The patient is not nervous/anxious.   All other systems reviewed and are negative.    Physical Exam  BP (!) 144/62 (BP Location: Left Arm, Cuff Size: Normal)   Pulse 92   Temp (!) 97.3 F (36.3 C) (Oral)   Ht '5\' 10"'  (1.778 m)   Wt 274 lb 12.8 oz (124.6 kg)   SpO2 99%   BMI 39.43 kg/m   Wt Readings from Last 5 Encounters:  08/21/20 274 lb 12.8 oz (124.6 kg)  08/20/20 270 lb (122.5 kg)  07/24/20 276 lb (125.2 kg)  07/22/20 278 lb 8 oz (126.3 kg)  05/29/20 282 lb (127.9 kg)    BMI Readings from Last 5 Encounters:  08/21/20 39.43 kg/m  08/20/20 38.74 kg/m  07/24/20 39.60 kg/m  07/22/20 39.96 kg/m  05/29/20 40.46 kg/m     Physical Exam Vitals and nursing note reviewed.  Constitutional:      General: He is not in acute  distress.    Appearance: Normal appearance. He  is obese.     Comments: Frail elderly male  HENT:     Head: Normocephalic and atraumatic.     Right Ear: Hearing and external ear normal.     Left Ear: Hearing and external ear normal.     Nose: No mucosal edema.     Right Turbinates: Not enlarged.     Left Turbinates: Not enlarged.  Cardiovascular:     Rate and Rhythm: Normal rate and regular rhythm.     Pulses: Normal pulses.     Heart sounds: Normal heart sounds. No murmur heard.   Pulmonary:     Effort: Pulmonary effort is normal.     Breath sounds: No decreased breath sounds, wheezing or rales.     Comments: Minutes of breath sounds throughout exam, significantly diminished in the bases Musculoskeletal:     Cervical back: Normal range of motion.     Right lower leg: No edema.     Left lower leg: No edema.  Lymphadenopathy:     Cervical: No cervical adenopathy.  Skin:    General: Skin is warm and dry.     Capillary Refill: Capillary refill takes less than 2 seconds.     Findings: No erythema or rash.  Neurological:     General: No focal deficit present.     Mental Status: He is alert and oriented to person, place, and time.     Motor: Weakness present.     Coordination: Coordination normal.     Gait: Gait abnormal.  Psychiatric:        Mood and Affect: Mood normal.        Behavior: Behavior normal. Behavior is cooperative.        Thought Content: Thought content normal.        Judgment: Judgment normal.       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 Plan: Continue oxygen therapy  OSA treated with BiPAP Continue BiPAP  ILD (interstitial lung disease) ([place]) Suspected that interstitial lung disease is progressed Patient has declined antifibrotic's in the past  Plan: Referral to hospice Continue oxygen therapy  Chronic  respiratory failure with hypoxia ([place]) Continue oxygen therapy Referral to hospice  Healthcare maintenance Encouraged COVID-19 vaccination, patient declined Recommend seasonal flu vaccine in fall/2021, patient plans to obtain this from primary care    Return in about 3 months (around 11/20/2020), or if symptoms worsen or fail to improve, for Follow up with Dr. [name].   Lauraine Rinne, NP 08/21/2020   This appointment required 32 minutes of patient care (this includes precharting, chart review, review of results, face-to-face care, etc.).

## 2020-08-24 NOTE — Telephone Encounter (Signed)
Called [facility] and spoke to [place] told her [name] will be the attending. Laurelyn Sickle verbalized understanding.

## 2020-08-31 ENCOUNTER — Other Ambulatory Visit: Payer: Self-pay | Admitting: Physician Assistant

## 2020-08-31 NOTE — Telephone Encounter (Signed)
Spoke to pt's wife [name], asked her of pt is still taking Doxycycline 100 mg daily we received a refill request? [name] said yes and he is at present taking twice a day for the abscess until it closes. She said he was told by wound doctor to continue BID until wound closes then can go back to one a day. Told her okay will send Rx in. Lavenia Atlas for twice a day.

## 2020-08-31 NOTE — Telephone Encounter (Signed)
Pt requesting rx refill for Doxycycline 100 mg tab LOV: 07/22/2020 No future visits scheduled Last refill: 07/10/2020  Please Advise.

## 2020-09-03 ENCOUNTER — Telehealth: Payer: Self-pay

## 2020-09-03 NOTE — Telephone Encounter (Signed)
Pt is complaining of a burning sensation when he urinates.

## 2020-09-03 NOTE — Telephone Encounter (Signed)
Patient went to urgent and has confirmed UTI.  Was given antibiotics injection and gave him Ciprolexin and are going to culture.    -[facility] in [place]

## 2020-09-03 NOTE — Telephone Encounter (Signed)
Please call pt and schedule an appt with a provider or can go to Urgent Care to be evaluated. [name] is out of the office.

## 2020-09-03 NOTE — Telephone Encounter (Signed)
FYI, see message. 

## 2020-09-03 NOTE — Telephone Encounter (Signed)
Spoke with pt.'s wife. Told her we have no openings today, that would allow pt to turn in urinalysis this business day. Recommended to wife that pt be seen at an urgent care today. Pt.'s wife is going to call an urgent care that she states she believes [name] can see results from.

## 2020-09-07 ENCOUNTER — Telehealth: Payer: Self-pay

## 2020-09-07 NOTE — Telephone Encounter (Signed)
Nurse Assessment Nurse: Louann Liv, RN, Darl Pikes Date/Time Lamount Cohen Time): 09/06/2020 6:27:41 PM Confirm and document reason for call. If symptomatic, describe symptoms. ---Caller state her husband was started on rx for UTI by urgent care. Patient now has fever with weakness and irregular heart rate, onset yesterday. Began abx on Thursday evening: Cipro. Also received injectable abx at the clinic that day. He has A-fib. States they were not able to be seen by [facility] on Thursday and had to go to UC. Does the patient have any new or worsening symptoms? ---Yes Will a triage be completed? ---Yes Related visit to physician within the last 2 weeks? ---Yes Does the PT have any chronic conditions? (i.e. diabetes, asthma, this includes High risk factors for pregnancy, etc.) ---Yes List chronic conditions. ---A-fib; CHF; COPD; on oxygen full time, high BP Is this a behavioral health or substance abuse call? ---No Guidelines Guideline Title Affirmed Question Affirmed Notes Nurse Date/Time Lamount Cohen Time) Urinary Tract Infection on Antibiotic Follow-up Call - Male [1] Fever > 100.0 F (37.8 C) AND [2] new-onset since starting antibiotics Louann Liv, RNDarl Pikes 09/06/2020 6:31:58 PM Disp. Time (Eastern Time) Disposition Final UserPLEASE NOTE: All timestamps contained within this report are represented as [place] Standard Time. CONFIDENTIALTY NOTICE: This fax transmission is intended only for the addressee. It contains information that is legally privileged, confidential or otherwise protected from use or disclosure. If you are not the intended recipient, you are strictly prohibited from reviewing, disclosing, copying using or disseminating any of this information or taking any action in reliance on or regarding this information. If you have received this fax in error, please notify us immediately by telephone so that we can arrange for its return to [place]. Phone: [phone], Toll-Free: [phone], Fax:  [phone] Page: 2 of 2 Call Id: [id number] 09/06/2020 6:37:37 PM See HCP within 4 Hours (or PCP triage) Yes Louann Liv, RN, Helane Rima Disagree/Comply Disagree Caller Understands Yes PreDisposition Call another nurse Care Advice Given Per Guideline SEE HCP (OR PCP TRIAGE) WITHIN 4 HOURS: * IF OFFICE WILL BE CLOSED AND NO PCP (PRIMARY CARE PROVIDER) SECOND-LEVEL TRIAGE: You need to be seen within the next 3 or 4 hours. A nearby Urgent Care Center [facility] is often a good source of care. Another choice is to go to the ED. Go sooner if you become worse. FEVER MEDICINES: * For fevers above 101 F (38.3 C) take either acetaminophen or ibuprofen. CALL BACK IF: * You become worse CARE ADVICE given per Urinary Tract Infection on Antibiotic Follow-Up Call, Male (Adult) guideline. Comments User: Silvio Clayman, RN Date/Time Lamount Cohen Time): 09/06/2020 6:31:54 PM States her husband is in palliative care User: Silvio Clayman, RN Date/Time Lamount Cohen Time): 09/06/2020 6:42:45 PM Caller states her husband is refusing to go to the ED or to UC. He states he can't wait that long on a stretcher or in a waiting room. Patient states he wants to be seen in the office tomorrow, can they please get him an appointment? Advised caller that patient's symptoms were urgent, and recommendation is to be seen in 4 hours, not longer. Caller is worried about sepsis. Advised that patient needs hands-on evaluation at this point, blood work, Catering manager. She states she will try to talk him into going to the ED. Patient still wants appointment tomorrow

## 2020-09-07 NOTE — Telephone Encounter (Signed)
Received a VM from patient's wife requesting a return call. Phone call placed to patient's wife.VM left with contact information.

## 2020-09-07 NOTE — Telephone Encounter (Signed)
Please see Triage note

## 2020-09-08 ENCOUNTER — Encounter: Payer: Self-pay | Admitting: Physician Assistant

## 2020-09-08 ENCOUNTER — Ambulatory Visit (INDEPENDENT_AMBULATORY_CARE_PROVIDER_SITE_OTHER): Payer: Medicare Other | Admitting: Physician Assistant

## 2020-09-08 ENCOUNTER — Other Ambulatory Visit: Payer: Self-pay

## 2020-09-08 VITALS — BP 130/72 | HR 100 | Temp 98.2°F | Ht 70.0 in | Wt 271.0 lb

## 2020-09-08 DIAGNOSIS — Z23 Encounter for immunization: Secondary | ICD-10-CM | POA: Diagnosis not present

## 2020-09-08 DIAGNOSIS — N3000 Acute cystitis without hematuria: Secondary | ICD-10-CM

## 2020-09-08 DIAGNOSIS — K5909 Other constipation: Secondary | ICD-10-CM | POA: Diagnosis not present

## 2020-09-08 LAB — POCT URINALYSIS DIPSTICK
Bilirubin, UA: NEGATIVE
Blood, UA: NEGATIVE
Glucose, UA: NEGATIVE
Ketones, UA: NEGATIVE
Nitrite, UA: NEGATIVE
Protein, UA: NEGATIVE
Spec Grav, UA: 1.015 (ref 1.010–1.025)
Urobilinogen, UA: 0.2 E.U./dL
pH, UA: 7 (ref 5.0–8.0)

## 2020-09-08 MED ORDER — CEPHALEXIN 500 MG PO CAPS: 500.0000 mg | ORAL_CAPSULE | Freq: Two times a day (BID) | ORAL | 0 refills | Status: DC

## 2020-09-08 NOTE — Progress Notes (Signed)
Maurice Reed is a 79 y.o. male here for a follow up of a pre-existing problem.  I acted as a Education administrator for [employer], PA-C Anselmo Pickler, LPN   History of Present Illness:   Chief Complaint  Patient presents with  . Urinary symptoms  . bladder spasms    HPI   Urinary symptoms Pt was seen at Urgent Care 9/30 and was started on Cipro 500 mg BID. He is taking this as prescribed. His wife notes that he had a positive urine culture and that he was told that his abx was appropriate given his culture results.  His temperature was 102 on Thursday, had confusion and weakness. Having incontinence.  Denies: further fevers, chills, nausea, vomiting  Constipation Patient reports that he is dealing with constipation.  He takes 100 mg Colace daily and will occasionally use a cap of MiraLAX in his coffee.  He continues to have constipation.  Denies nausea, vomiting, rectal bleeding.  Past Medical History:  Diagnosis Date  . Arthritis   . Atrial fibrillation ([place])   . 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 Diverticula, bladder   . Erectile dysfunction   . GERD (gastroesophageal reflux disease)   . Kidney stones   . Obesity   . Pulmonary embolism ([facility])      Social History   Tobacco Use  . Smoking status: Former Smoker    Packs/day: 3.00    Years: 25.00    Pack years: 75.00    Types: Cigarettes    Quit date: 11/28/1984    Years since quitting: 35.8  . Smokeless tobacco: Never Used  Vaping Use  . Vaping Use: Never used  Substance Use Topics  . Alcohol use: Never  . Drug use: Yes    Types: Marijuana    Comment: Gummies     Past Surgical History:  Procedure Laterality Date  . APPENDECTOMY  1963  . LITHOTRIPSY  1987  . NOSE SURGERY  2003  . TONSILLECTOMY AND ADENOIDECTOMY  1948  . UMBILICAL HERNIA REPAIR  1984    Family History  Problem Relation Age of Onset  . Arthritis Mother   . Hearing loss Mother   . Hypertension Mother   . Heart disease  Mother   . Stroke Mother   . Arthritis Father   . Hearing loss Father   . Heart disease Father   . Hypertension Father   . Heart attack Father   . Kidney disease Father   . Stroke Father     Allergies  Allergen Reactions  . Other Itching    Cats: watery eyes, itching, sneezing    Current Medications:   Current Outpatient Medications:  .  Ascorbic Acid (VITAMIN C) 500 MG CAPS, Take 1 capsule by mouth daily., Disp: , Rfl:  .  Atropine Sulfate-NaCl 0.01-0.9 % SOLN, Apply 1 drop to eye at bedtime. , Disp: , Rfl:  .  Cholecalciferol (VITAMIN D3) 3000 units TABS, Take by mouth., Disp: , Rfl:  .  clobetasol (TEMOVATE) 0.05 % external solution, APPLY TO AFFECTED AREA(S)  ON SCALP TOPICALLY ONE TO  TWO TIMES A DAY AS NEEDED, Disp: 150 mL, Rfl: 4 .  desonide (DESOWEN) 0.05 % lotion, Apply topically., Disp: , Rfl:  .  docusate sodium (COLACE) 100 MG capsule, Take 100 mg by mouth daily. , Disp: , Rfl:  .  doxycycline (VIBRA-TABS) 100 MG tablet, Take 1 tablet (100 mg total) by mouth 2 (two) times daily.,  Disp: 180 tablet, Rfl: 0 .  ELIQUIS 5 MG TABS tablet, TAKE 1 TABLET BY MOUTH  TWICE DAILY, Disp: 180 tablet, Rfl: 3 .  erythromycin ophthalmic ointment, APP THIN LAYER IN OS BID, Disp: , Rfl: 1 .  ipratropium-albuterol (DUONEB) 0.5-2.5 (3) MG/3ML SOLN, Take 3 mLs by nebulization every 6 (six) hours as needed., Disp: 360 mL, Rfl: 3 .  magnesium oxide (MAG-OX) 400 MG tablet, Take 1 tablet (400 mg total) by mouth 2 (two) times daily., Disp: 60 tablet, Rfl: 2 .  metoprolol succinate (TOPROL-XL) 25 MG 24 hr tablet, TAKE 1 TABLET BY MOUTH  DAILY, Disp: 90 tablet, Rfl: 3 .  mometasone (NASONEX) 50 MCG/ACT nasal spray, Place 2 sprays into the nose daily. (Patient taking differently: Place 2 sprays into the nose as needed. ), Disp: 51 g, Rfl: 2 .  NARCAN 4 MG/0.1ML LIQD nasal spray kit, INSTILL ONE SPRAY PRN FOR ACCIDENTAL OVERDOSE, Disp: , Rfl: 0 .  oxyCODONE (OXY IR/ROXICODONE) 5 MG immediate release  tablet, Take 5 mg by mouth 2 (two) times daily as needed. for pain, Disp: , Rfl: 0 .  potassium chloride (MICRO-K) 10 MEQ CR capsule, TAKE 2 CAPSULES BY MOUTH IN THE AM AND 1 CAPSULE IN THE EVENING, Disp: 90 capsule, Rfl: 6 .  prednisoLONE acetate (PRED FORTE) 1 % ophthalmic suspension, Place 1 drop into the left eye 2 (two) times daily. , Disp: , Rfl:  .  tadalafil (CIALIS) 20 MG tablet, Take 0.5-1 tablets (10-20 mg total) by mouth every other day as needed for erectile dysfunction., Disp: 5 tablet, Rfl: 11 .  Tafluprost, PF, 0.0015 % SOLN, Apply 1 drop to eye daily. Drop 1 drop into right at night, Disp: , Rfl:  .  tamsulosin (FLOMAX) 0.4 MG CAPS capsule, TAKE 1 CAPSULE BY MOUTH AT  BEDTIME, Disp: 90 capsule, Rfl: 3 .  temazepam (RESTORIL) 15 MG capsule, Take 1 capsule (15 mg total) by mouth at bedtime as needed., Disp: 90 capsule, Rfl: 1 .  torsemide (DEMADEX) 20 MG tablet, TAKE 2 TABLETS BY MOUTH IN THE AM AND 1 TAB IN THE EVENING, Disp: 270 tablet, Rfl: 3 .  triamcinolone ointment (KENALOG) 0.5 %, APPLY TO AFFECTED AREA(S)  TOPICALLY 1 TO 2 TIMES  DAILY, Disp: 45 g, Rfl: 2 .  cephALEXin (KEFLEX) 500 MG capsule, Take 1 capsule (500 mg total) by mouth 2 (two) times daily., Disp: 14 capsule, Rfl: 0   Review of Systems:   ROS Negative unless otherwise specified per HPI.  Vitals:   Vitals:   09/08/20 1545  BP: 130/72  Pulse: 100  Temp: 98.2 F (36.8 C)  TempSrc: Temporal  SpO2: 95%  Weight: 271 lb (122.9 kg)  Height: _0  (1.778 m)     Body mass index is 38.88 kg/m.  Physical Exam:   Physical Exam  Results for orders placed or performed in visit on 09/08/20  POCT urinalysis dipstick  Result Value Ref Range   Color, UA yellow    Clarity, UA cloudy    Glucose, UA Negative Negative   Bilirubin, UA Negative    Ketones, UA Negative    Spec Grav, UA 1.015 1.010 - 1.025   Blood, UA Negative    pH, UA 7.0 5.0 - 8.0   Protein, UA Negative Negative   Urobilinogen, UA 0.2 0.2  or 1.0 E.U./dL   Nitrite, UA Negative    Leukocytes, UA Moderate (2+) (A) Negative   Appearance     Odor  Assessment and Plan:   Maurice Reed was seen today for urinary symptoms and bladder spasms.  Diagnoses and all orders for this visit:  Acute cystitis without hematuria UA does show bacteria however will await culture results to treat. I did give him a pocket prescription of Keflex to trial should his symptoms change in the interim. Patient strongly does not want to go to the ER for further evaluation should his symptoms worsen in the future.  We had a long discussion about his overall goals of care, and his wife Maurice Reed who is here, states that he has a DNR/DNI form.  She will bring this by for [place] to review and keep in patient's chart. Will contact patient with culture results and change appropriate therapy as indicated. -     POCT urinalysis dipstick -     Urine Culture; Future -     Urine Culture  Need for immunization against influenza -     Flu Vaccine QUAD High Dose(Fluad)  Chronic constipation Encouraged daily use of MiraLAX, and if unable to have successful results, may double dose at that time.  Also recommended patient continue to use Colace.  Continue to work on hydration, and follow-up with [place] if symptoms continue to worsen or do not improve.  Other orders -     cephALEXin (KEFLEX) 500 MG capsule; Take 1 capsule (500 mg total) by mouth 2 (two) times daily.  CMA or LPN served as scribe during this visit. History, Physical, and Plan performed by medical provider. The above documentation has been reviewed and is accurate and complete.  Time spent with patient today was 30 minutes which consisted of chart review, discussing diagnosis, work up, treatment answering questions and documentation.  Inda Coke, PA-C

## 2020-09-08 NOTE — Patient Instructions (Signed)
It was great to see you!  Constipation Miralax 1 capful daily -- increase as tolerated Colace 100 mg daily  UTI Start keflex if symptoms worsen.  We will be in touch with urine culture results.  Bring [place] a copy of DNR and other forms so we can put in your record.  Take care,  Jarold Motto PA-C

## 2020-09-09 LAB — URINE CULTURE
MICRO NUMBER:: 11033344
Result:: NO GROWTH
SPECIMEN QUALITY:: ADEQUATE

## 2020-09-14 ENCOUNTER — Other Ambulatory Visit: Payer: Self-pay

## 2020-09-14 ENCOUNTER — Other Ambulatory Visit: Payer: Medicare Other | Admitting: Hospice

## 2020-09-14 DIAGNOSIS — J849 Interstitial pulmonary disease, unspecified: Secondary | ICD-10-CM

## 2020-09-14 DIAGNOSIS — Z515 Encounter for palliative care: Secondary | ICD-10-CM

## 2020-09-14 NOTE — Progress Notes (Signed)
[facility] Consult Note Telephone: [phone]  Fax: [phone]  PATIENT NAME: Maurice Reed DOB: [birth year] MRN: [id number]  PRIMARY CARE PROVIDER:   Inda Coke, [place], [place], [place] [address] [place],  [place] [address]  REFERRING PROVIDER: Inda Coke, [place] Inda Coke, [place] [address] [place],  [place] [address]   RESPONSIBLE PARTY:  Self [phone] - spouse - best to call  Extended Emergency Contact Information Primary Emergency Contact: Josimar, [place] Mobile Phone: [phone] Relation: Spouse Secondary Emergency Contact: Perrin Maltese Mobile Phone: [phone] Relation: Granddaughter  I met face to face with patient and family in home/facility.  RECOMMENDATIONS/PLAN:   ADVANCE care planning: Patient was evaluated for hospice admission last week but was not admitted because patient/family wanted to continue with Eliquis.  Visit consisted of building trust and discussions on Palliative Medicine as specialized medical care for people living with serious illness, aimed at facilitating better quality of life through symptoms relief, assisting with advance care plan and establishing goals of care.   CODE STATUS:  CODE STATUS reviewed. Patient elected DO NOT RESUSCITATE.  DNR form signed for patient; same document uploaded to epic today.  GOALS OF CARE: Goals of care include to maximize quality of life and symptom management.   Patient is not in denial of his declining health status.  His religious faith gives him spiritual strengths; he said he is not afraid of death.  He wishes to live best quality of life possible; live until his birthday 02/17/2020 and have his family all with him. Family planning on this.  Therapeutic listening and ample emotional support provided.  He is also open to hospice services in the future.  Discussion on medical orders for scope of treatment today.  MOST   selections include DO NOT RESUSCITATE, limited additional intervention, IV fluids for defined trial.,  Antibiotics as indicated, no feeding tube.  Patient/family open to Hospice service in the future when appropriate. Patient, his wife, his grandchildren and the hours were present during visit. Signed MOST form left at home with patient; same document uploaded to epic today.  Follow up Palliative Care Visit: Palliative care will continue to follow for goals of care clarification and symptom management.  Follow-up in 2 months.  Cognitive / Functional decline / Symptom Management: Patient with recent urinary tract infection UTI, treated with Cipro.  Patient completed the course of antibiotics; no urinary symptoms.   MRI of brain  was recently done to pinpoint a reason for his legs giving away, burning/tingling from bil knees down. 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 Some facet edema at L4-5 could be associated with back pain, per Epic chart review.  Family reported neurologist explained some slowing of blood flow in the thoracic area and that nothing could be done at this time. Patient in no distress, saying he is ready to go when the [place] calls him home.   Family procured adjustable bed and bedside commode, urinals for his use comfort.  Delivery of transport chair expected next week.  Mobility is limited as his legs continues to give way. Abscess at the back was lanced and has significantly improved. Patient continues on Doxycylcline 167m daily.    Patient's cognition remains intact at this time, alert and oriented to time, place and person.  He is currently on oxygen 3 to 4 L/min and experiences shortness of  breath on mild to moderate exertion.   occasional body jerkiness continues. He is currently on oxycodone 87m BID for pain.  He is compliant with his breathing treatments, and continues on Eliquis  for Afib, Torsemide for CHF and adhers to fluid restriction. Palliative will continue to monitor for symptom management/decline and make recommendations as needed.  Family /Caregiver/Community Supports: Patient enjoys care and support of his spouse, children and grandchildren.  Strong family support network identified.  I spent 1 hour and 16 minutes providing this consultation; time iincludes time spent with patient/family, chart review, provider coordination,  and documentation. More than 50% of the time in this consultation was spent on coordinating communication  CHIEF COMPLAIN/HISTORY OF PRESENT ILLNESS:  Maurice TOUSLEYis a 79y.o. male with multiple medical problems including COPD, [name], hypertension,. Palliative Care was asked to follow this patient by consultation request of WInda Coke PUtahto help address advance care planning and goals of care. This is a follow up visit.  CODE STATUS: DO NOT RESUSCITATE  PPS: 50%  HOSPICE ELIGIBILITY/DIAGNOSIS: TBD  PAST MEDICAL HISTORY:  Past Medical History:  Diagnosis Date  . Arthritis   . Atrial fibrillation ([facility]   . 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 Diverticula, bladder   . Erectile dysfunction   . GERD (gastroesophageal reflux disease)   . Kidney stones   . Obesity   . Pulmonary embolism ([facility]     SOCIAL HX:  Social History   Tobacco Use  . Smoking status: Former Smoker    Packs/day: 3.00    Years: 25.00    Pack years: 75.00    Types: Cigarettes    Quit date: 11/28/1984    Years since quitting: 35.8  . Smokeless tobacco: Never Used  Substance Use Topics  . Alcohol use: Never    ALLERGIES:  Allergies  Allergen Reactions  . Other Itching    Cats: watery eyes, itching, sneezing     PERTINENT MEDICATIONS:  Outpatient Encounter Medications as of 09/14/2020  Medication Sig  . Ascorbic Acid (VITAMIN C) 500 MG CAPS Take 1 capsule by mouth daily.  . Atropine Sulfate-NaCl 0.01-0.9 % SOLN  Apply 1 drop to eye at bedtime.   . cephALEXin (KEFLEX) 500 MG capsule Take 1 capsule (500 mg total) by mouth 2 (two) times daily.  . Cholecalciferol (VITAMIN D3) 3000 units TABS Take by mouth.  . clobetasol (TEMOVATE) 0.05 % external solution APPLY TO AFFECTED AREA(S)  ON SCALP TOPICALLY ONE TO  TWO TIMES A DAY AS NEEDED  . desonide (DESOWEN) 0.05 % lotion Apply topically.  . docusate sodium (COLACE) 100 MG capsule Take 100 mg by mouth daily.   .Marland Kitchendoxycycline (VIBRA-TABS) 100 MG tablet Take 1 tablet (100 mg total) by mouth 2 (two) times daily.  .Marland KitchenELIQUIS 5 MG TABS tablet TAKE 1 TABLET BY MOUTH  TWICE DAILY  . erythromycin ophthalmic ointment APP THIN LAYER IN OS BID  . ipratropium-albuterol (DUONEB) 0.5-2.5 (3) MG/3ML SOLN Take 3 mLs by nebulization every 6 (six) hours as needed.  . magnesium oxide (MAG-OX) 400 MG tablet Take 1 tablet (400 mg total) by mouth 2 (two) times daily.  . metoprolol succinate (TOPROL-XL) 25 MG 24 hr tablet TAKE 1 TABLET BY MOUTH  DAILY  . mometasone (NASONEX) 50 MCG/ACT nasal spray Place 2 sprays into the nose daily. (Patient taking differently: Place 2 sprays into the nose as needed. )  . NARCAN 4 MG/0.1ML LIQD nasal  spray kit INSTILL ONE SPRAY PRN FOR ACCIDENTAL OVERDOSE  . oxyCODONE (OXY IR/ROXICODONE) 5 MG immediate release tablet Take 5 mg by mouth 2 (two) times daily as needed. for pain  . potassium chloride (MICRO-K) 10 MEQ CR capsule TAKE 2 CAPSULES BY MOUTH IN THE AM AND 1 CAPSULE IN THE EVENING  . prednisoLONE acetate (PRED FORTE) 1 % ophthalmic suspension Place 1 drop into the left eye 2 (two) times daily.   . tadalafil (CIALIS) 20 MG tablet Take 0.5-1 tablets (10-20 mg total) by mouth every other day as needed for erectile dysfunction.  . Tafluprost, PF, 0.0015 % SOLN Apply 1 drop to eye daily. Drop 1 drop into right at night  . tamsulosin (FLOMAX) 0.4 MG CAPS capsule TAKE 1 CAPSULE BY MOUTH AT  BEDTIME  . temazepam (RESTORIL) 15 MG capsule Take 1 capsule  (15 mg total) by mouth at bedtime as needed.  . torsemide (DEMADEX) 20 MG tablet TAKE 2 TABLETS BY MOUTH IN THE AM AND 1 TAB IN THE EVENING  . triamcinolone ointment (KENALOG) 0.5 % APPLY TO AFFECTED AREA(S)  TOPICALLY 1 TO 2 TIMES  DAILY   No facility-administered encounter medications on file as of 09/14/2020.    PHYSICAL EXAM/ROS: Current weight 273 pounds height  is 5 feet 10 inches  General: NAD,  Left eye blindness, cooperative Cardiovascular: regular rate and rhythm, no chest pain reported Pulmonary: no cough, no shortness of breath, clear ant/post fields, normal respiratory effort; on oxygen supplementation 4 L/min Abdomen: soft, non tender, positive bowel sounds in all quadrants GU: denies dysuria, no suprapubic tenderness Extremities: no edema, no joint deformities Skin: no rashes to exposed skin Neurological: Weakness but otherwise non focal Teodoro Spray, NP

## 2020-09-22 ENCOUNTER — Telehealth: Payer: Self-pay

## 2020-09-22 MED ORDER — NYSTATIN 100000 UNIT/GM EX OINT: 1.0000 "application " | TOPICAL_OINTMENT | Freq: Two times a day (BID) | CUTANEOUS | 1 refills | Status: AC

## 2020-09-22 NOTE — Telephone Encounter (Signed)
Spoke to [place] pt's wife, told her [name] said we are going to send in Nystatin Ointment apply to areas twice a day. Rx sent to pharmacy. [name] verbalized understanding.

## 2020-09-22 NOTE — Telephone Encounter (Signed)
Pt wife called stating pt is still having skin breakdown. Pt wife stated they had an appointment with dermatology but had to cancel because pt was unable to make appt. Wife believes pt has yeast in groin area. Please advise.

## 2020-10-10 ENCOUNTER — Other Ambulatory Visit: Payer: Self-pay | Admitting: Physician Assistant

## 2020-10-10 DIAGNOSIS — K219 Gastro-esophageal reflux disease without esophagitis: Secondary | ICD-10-CM

## 2020-10-12 ENCOUNTER — Other Ambulatory Visit: Payer: Self-pay | Admitting: Physician Assistant

## 2020-10-14 ENCOUNTER — Other Ambulatory Visit: Payer: Self-pay

## 2020-10-14 ENCOUNTER — Telehealth (INDEPENDENT_AMBULATORY_CARE_PROVIDER_SITE_OTHER): Payer: Medicare Other | Admitting: Physician Assistant

## 2020-10-14 ENCOUNTER — Telehealth: Payer: Self-pay | Admitting: Physician Assistant

## 2020-10-14 ENCOUNTER — Encounter: Payer: Self-pay | Admitting: Physician Assistant

## 2020-10-14 VITALS — BP 124/84 | Ht 70.0 in | Wt 268.0 lb

## 2020-10-14 DIAGNOSIS — M79601 Pain in right arm: Secondary | ICD-10-CM | POA: Diagnosis not present

## 2020-10-14 DIAGNOSIS — M79602 Pain in left arm: Secondary | ICD-10-CM

## 2020-10-14 NOTE — Telephone Encounter (Signed)
Patient's wife is inquiring what Palliative Care groups will service [facility], where patient currently resides.  Please contact wife if there are options so we can determine if there is a better fit for patient.   Thanks,  [name]

## 2020-10-14 NOTE — Progress Notes (Signed)
Virtual Visit via Video   I connected with Maurice Reed on 10/14/20 at  4:00 PM EST by a video enabled telemedicine application and verified that I am speaking with the correct person using two identifiers. Location patient: Home Location provider: [facility], Office Persons participating in the virtual visit: Javis, Abboud PA-C, Anselmo Pickler, LPN   I discussed the limitations of evaluation and management by telemedicine and the availability of in person appointments. The patient expressed understanding and agreed to proceed.  I acted as a Education administrator for [employer], [employer], LPN   Subjective:   HPI:   Arm pain Pt c/o upper arm pain x 2 weeks, no swelling or warm to touch. Pt does have some psoriasis areas on his upper arms. He has recently started [place] for his psoriasis, which has been slowly improving his skin.  He is not on a statin. Denies trauma.  Wife did recently have surgery and is non-weight bearing. He is helping out more around the house than he normally does which does require him to push himself up out of chairs and walkers more frequently.  Denies new or unusual weakness, numbness/tingling.  ROS: See pertinent positives and negatives per HPI.  Patient Active Problem List   Diagnosis Date Noted  . Chronic anticoagulation 04/16/2020  . Chronic constipation 03/03/2020  . Bilateral lower abdominal discomfort 03/03/2020  . Chronic respiratory failure with hypoxia ([place]) 12/23/2019  . Shortness of breath 12/23/2019  . Healthcare maintenance 08/16/2019  . 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 Bilateral lower extremity edema 07/04/2018  . Sclerosis of the skin 06/26/2018  . ILD (interstitial lung disease) ([place]) 05/22/2018  . Insomnia 05/22/2018  . Erectile dysfunction 05/22/2018  . Obesity   . Kidney  stones   . Essential hypertension   . GERD (gastroesophageal reflux disease)   . Former smoker   . Arthritis   . 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 OSA treated with BiPAP 06/17/2013  . 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 Atrial fibrillation ([place]) 06/17/2013  . Rosacea 06/17/2013  . 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 History of pulmonary embolus (PE) 12/05/1998    Social History   Tobacco Use  . Smoking status: Former Smoker    Packs/day: 3.00    Years: 25.00    Pack years: 75.00    Types: Cigarettes    Quit date: 11/28/1984    Years since quitting: 35.9  . Smokeless tobacco: Never Used  Substance Use Topics  . Alcohol use: Never    Current Outpatient Medications:  .  Apremilast (OTEZLA) 30 MG TABS, Take 1 tablet by mouth daily., Disp: , Rfl:  .  Ascorbic Acid (VITAMIN C) 500 MG CAPS, Take 1 capsule by mouth daily., Disp: , Rfl:  .  Atropine Sulfate-NaCl 0.01-0.9 % SOLN, Apply 1 drop to eye at bedtime. , Disp: , Rfl:  .  Cholecalciferol (VITAMIN D3) 3000 units TABS, Take by mouth., Disp: , Rfl:  .  clobetasol (TEMOVATE) 0.05 % external solution, APPLY TO AFFECTED AREA(S)  ON SCALP TOPICALLY ONE TO  TWO TIMES A DAY AS NEEDED, Disp: 150 mL, Rfl: 4 .  desonide (DESOWEN) 0.05 % lotion, Apply topically., Disp: , Rfl:  .  docusate sodium (COLACE) 100 MG capsule, Take 100 mg by mouth daily. ,  Disp: , Rfl:  .  doxycycline (VIBRA-TABS) 100 MG tablet, TAKE 1 TABLET BY MOUTH  TWICE DAILY, Disp: 180 tablet, Rfl: 0 .  ELIQUIS 5 MG TABS tablet, TAKE 1 TABLET BY MOUTH  TWICE DAILY, Disp: 180 tablet, Rfl: 3 .  erythromycin ophthalmic ointment, APP THIN LAYER IN OS BID, Disp: , Rfl: 1 .  ipratropium-albuterol (DUONEB) 0.5-2.5 (3) MG/3ML SOLN, Take 3 mLs by nebulization every 6 (six) hours as needed., Disp: 360 mL, Rfl: 3 .  magnesium oxide (MAG-OX) 400 MG tablet, TAKE 1 TABLET BY MOUTH  TWICE A DAY, Disp: 180 tablet, Rfl: 2 .  metoprolol succinate (TOPROL-XL) 25 MG 24 hr tablet, TAKE 1 TABLET BY MOUTH  DAILY, Disp: 90 tablet, Rfl: 3 .  mometasone (NASONEX) 50 MCG/ACT nasal spray, Place 2 sprays into the nose daily. (Patient taking differently: Place 2 sprays into the nose as needed. ), Disp: 51 g, Rfl: 2 .  NARCAN 4 MG/0.1ML LIQD nasal spray kit, INSTILL ONE SPRAY PRN FOR ACCIDENTAL OVERDOSE, Disp: , Rfl: 0 .  nystatin ointment (MYCOSTATIN), Apply 1 application topically 2 (two) times daily., Disp: 30 g, Rfl: 1 .  oxyCODONE (OXY IR/ROXICODONE) 5 MG immediate release tablet, Take 5 mg by mouth 2 (two) times daily as needed. for pain, Disp: , Rfl: 0 .  potassium chloride (MICRO-K) 10 MEQ CR capsule, TAKE 2 CAPSULES BY MOUTH IN THE AM AND 1 CAPSULE IN THE EVENING, Disp: 90 capsule, Rfl: 6 .  prednisoLONE acetate (PRED FORTE) 1 % ophthalmic suspension, Place 1 drop into the left eye 2 (two) times daily. , Disp: , Rfl:  .  tadalafil (CIALIS) 20 MG tablet, Take 0.5-1 tablets (10-20 mg total) by mouth every other day as needed for erectile dysfunction., Disp: 5 tablet, Rfl: 11 .  Tafluprost, PF, 0.0015 % SOLN, Apply 1 drop to eye daily. Drop 1 drop into right at night, Disp: , Rfl:  .  tamsulosin (FLOMAX) 0.4 MG CAPS capsule, TAKE 1 CAPSULE BY MOUTH AT  BEDTIME, Disp: 90 capsule, Rfl: 3 .  temazepam (RESTORIL) 15 MG capsule, Take 1 capsule (15 mg total) by mouth at bedtime as needed., Disp: 90 capsule, Rfl: 1 .  torsemide (DEMADEX) 20 MG tablet, TAKE 2 TABLETS BY MOUTH IN THE AM AND 1 TAB IN THE EVENING, Disp: 270 tablet, Rfl: 3 .  triamcinolone ointment (KENALOG) 0.5 %, APPLY TO AFFECTED AREA(S)  TOPICALLY 1 TO 2 TIMES  DAILY, Disp: 45 g, Rfl: 2  Allergies  Allergen Reactions  . Other Itching    Cats: watery eyes, itching, sneezing    Objective:   VITALS: Per patient if applicable, see vitals. GENERAL: Alert, appears well and in no acute distress. HEENT: Atraumatic, conjunctiva  clear, no obvious abnormalities on inspection of external nose and ears. NECK: Normal movements of the head and neck. CARDIOPULMONARY: No increased WOB. Speaking in clear sentences. I:E ratio WNL.  MS: Moves all visible extremities without noticeable abnormality. PSYCH: Pleasant and cooperative, well-groomed. Speech normal rate and rhythm. Affect is appropriate. Insight and judgement are appropriate. Attention is focused, linear, and appropriate.  NEURO: CN grossly intact. Oriented as arrived to appointment on time with no prompting. Moves both UE equally.  SKIN: No obvious lesions, wounds, erythema, or cyanosis noted on face or hands.  Assessment and Plan:   Terry was seen today for arm pain.  Diagnoses and all orders for this visit:  Bilateral arm pain   Suspect possible muscle deconditioning or overuse. Agreeable to home  health PT referral for further evaluation and management. If no improvement, will refer to sports medicine.  I discussed the assessment and treatment plan with the patient. The patient was provided an opportunity to ask questions and all were answered. The patient agreed with the plan and demonstrated an understanding of the instructions.   The patient was advised to call back or seek an in-person evaluation if the symptoms worsen or if the condition fails to improve as anticipated.   CMA or LPN served as scribe during this visit. History, Physical, and Plan performed by medical provider. The above documentation has been reviewed and is accurate and complete.  Time spent with patient today was 25 minutes which consisted of chart review, discussing diagnosis, work up, treatment answering questions and documentation.   [place], [place] 10/14/2020

## 2020-10-15 NOTE — Telephone Encounter (Signed)
It looks like [facility] may have a Palliative Care program? [facility] may stretch into [facility] as well.

## 2020-10-19 NOTE — Telephone Encounter (Signed)
Spoke to pt's wife [name], told her our Referral Coordinator said [facility] may have a Palliative Care program? [facility] may stretch into [facility] as well. [name] verbalized understanding and asked to send My Chart message with information. Told her okay will send it now.

## 2020-10-19 NOTE — Telephone Encounter (Signed)
Please call patient's wife and let her know about these two groups?

## 2020-11-04 ENCOUNTER — Ambulatory Visit: Payer: Medicare Other | Admitting: Physician Assistant

## 2020-11-18 ENCOUNTER — Other Ambulatory Visit: Payer: Self-pay

## 2020-11-18 ENCOUNTER — Other Ambulatory Visit: Payer: Medicare Other | Admitting: Hospice

## 2020-11-18 DIAGNOSIS — J849 Interstitial pulmonary disease, unspecified: Secondary | ICD-10-CM

## 2020-11-18 DIAGNOSIS — Z515 Encounter for palliative care: Secondary | ICD-10-CM

## 2020-11-18 NOTE — Progress Notes (Signed)
[facility] Palliative Care Consult Note Telephone: [phone]  Fax: [phone]  PATIENT NAME: Maurice Reed DOB: [birth year] MRN: [id number]  PRIMARY CARE PROVIDER:   Inda Coke, [place], [place], [place] [address] [place],  [place] [address]  REFERRING PROVIDER: Inda Coke, [place] Inda Coke, [place] [address] [place],  [place] [address]  RESPONSIBLE PARTY:Self [id number] [phone] - spouse - best to call Extended Emergency Contact Information Primary Emergency Contact: Alam, Guterrez Mobile Phone: [phone] Relation: Spouse Secondary Emergency Contact: Perrin Maltese Mobile Phone: [phone] Relation: Granddaughter  I met face to face with patient and family in home/facility.  RECOMMENDATIONS/PLAN:  ADVANCE care planning:  Visit consisted of building trust and discussions on Palliative Medicine as specialized medical care for people living with serious illness, aimed at facilitating better quality of life through symptoms relief, assisting with advance care plan and establishing goals of care.   CODE STATUS: CODE STATUS reviewed. Patient is a DO NOT RESUSCITATE.  GOALS OF CARE: Goals of care include to maximize quality of life and symptom management.  Patient is not in denial of his declining health status and said he is ready to go when the good Lord calls him.   MOST  selections include DO NOT RESUSCITATE, limited additional intervention, IV fluids for defined trial.,  Antibiotics as indicated, no feeding tube.  Patient/family open to Hospice service in the future when appropriate. Follow up Palliative Care Visit: Palliative care will continue to follow for goals of care clarification and symptom management.Follow-up in 3 months/as needed.  Cognitive / Functional decline/ Symptom Management: Pain in low back/generalized.  Patient was seen at [facility] pain clinic yesterday. Oxycodone 61m increased from  BID to TID; he was taken off Buprenorphine transdermal system.  He affirmed pain is well managed. Otezla for psoriasis was recently started by Dermatitis. Fu appointment in March 2022. Appointment with Cardiologist Dr BGwenlyn Found12/30/2021 Shortness of breath: Related to Interstitial Lung Disease. Continue oxygen supplementation 3-4L/Min, Albuterol, Duoneb as ordered.  Patient in no respiratory distress during visit. He is compliant with his breathing treatments,and continues onEliquis for Afib,Torsemide for CHF and adhers to fluid restriction.Palliative will continue to monitor for symptom management/decline and make recommendations as needed.  Family /Caregiver/Community Supports:Patient enjoys care and support of his spouse, children and grandchildren. Strong family support network identified.  I spent538mutes providing this consultation; time iincludes time spent with patient/family, chart review, provider coordination, and documentation. More than 50% of the time in this consultation was spent on coordinating communication  CHIEF COMPLAIN/HISTORY OF PRESENT ILLNESS:Maurice B Riceis a [age].o.malewith multiple medical problems including interstitial lung disease,COPD, [name], hypertension,. Palliative Care was asked to follow this patient by consultation request of[place], SaAldona BarPAto help address advance care planning and goals of care. This is a follow up visit.  CODE STATUS:DO NOT RESUSCITATE  PPS:50%  HOSPICE ELIGIBILITY/DIAGNOSIS: TBD  PAST MEDICAL HISTORY:  Past Medical History:  Diagnosis Date  . Arthritis   . Atrial fibrillation ([facility]  . 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 Diverticula, bladder   . Erectile dysfunction   . GERD (gastroesophageal reflux disease)   . Kidney stones   . Obesity   . Pulmonary embolism ([facility]    SOCIAL HX:  Social History   Tobacco Use  . Smoking status: Former Smoker    Packs/day: 3.00    Years:  25.00    Pack years: 75.00  Types: Cigarettes    Quit date: 11/28/1984    Years since quitting: 35.9  . Smokeless tobacco: Never Used  Substance Use Topics  . Alcohol use: Never    ALLERGIES:  Allergies  Allergen Reactions  . Other Itching    Cats: watery eyes, itching, sneezing     PERTINENT MEDICATIONS:  Outpatient Encounter Medications as of 11/18/2020  Medication Sig  . Apremilast (OTEZLA) 30 MG TABS Take 1 tablet by mouth daily.  . Ascorbic Acid (VITAMIN C) 500 MG CAPS Take 1 capsule by mouth daily.  . Atropine Sulfate-NaCl 0.01-0.9 % SOLN Apply 1 drop to eye at bedtime.   . Cholecalciferol (VITAMIN D3) 3000 units TABS Take by mouth.  . clobetasol (TEMOVATE) 0.05 % external solution APPLY TO AFFECTED AREA(S)  ON SCALP TOPICALLY ONE TO  TWO TIMES A DAY AS NEEDED  . desonide (DESOWEN) 0.05 % lotion Apply topically.  . docusate sodium (COLACE) 100 MG capsule Take 100 mg by mouth daily.   Marland Kitchen doxycycline (VIBRA-TABS) 100 MG tablet TAKE 1 TABLET BY MOUTH  TWICE DAILY  . ELIQUIS 5 MG TABS tablet TAKE 1 TABLET BY MOUTH  TWICE DAILY  . erythromycin ophthalmic ointment APP THIN LAYER IN OS BID  . ipratropium-albuterol (DUONEB) 0.5-2.5 (3) MG/3ML SOLN Take 3 mLs by nebulization every 6 (six) hours as needed.  . magnesium oxide (MAG-OX) 400 MG tablet TAKE 1 TABLET BY MOUTH TWICE A DAY  . metoprolol succinate (TOPROL-XL) 25 MG 24 hr tablet TAKE 1 TABLET BY MOUTH  DAILY  . mometasone (NASONEX) 50 MCG/ACT nasal spray Place 2 sprays into the nose daily. (Patient taking differently: Place 2 sprays into the nose as needed. )  . NARCAN 4 MG/0.1ML LIQD nasal spray kit INSTILL ONE SPRAY PRN FOR ACCIDENTAL OVERDOSE  . nystatin ointment (MYCOSTATIN) Apply 1 application topically 2 (two) times daily.  Marland Kitchen oxyCODONE (OXY IR/ROXICODONE) 5 MG immediate release tablet Take 5 mg by mouth 2 (two) times daily as needed. for pain  . potassium chloride (MICRO-K) 10 MEQ CR capsule TAKE 2 CAPSULES BY MOUTH IN  THE AM AND 1 CAPSULE IN THE EVENING  . prednisoLONE acetate (PRED FORTE) 1 % ophthalmic suspension Place 1 drop into the left eye 2 (two) times daily.   . tadalafil (CIALIS) 20 MG tablet Take 0.5-1 tablets (10-20 mg total) by mouth every other day as needed for erectile dysfunction.  . Tafluprost, PF, 0.0015 % SOLN Apply 1 drop to eye daily. Drop 1 drop into right at night  . tamsulosin (FLOMAX) 0.4 MG CAPS capsule TAKE 1 CAPSULE BY MOUTH AT  BEDTIME  . temazepam (RESTORIL) 15 MG capsule Take 1 capsule (15 mg total) by mouth at bedtime as needed.  . torsemide (DEMADEX) 20 MG tablet TAKE 2 TABLETS BY MOUTH IN THE AM AND 1 TAB IN THE EVENING  . triamcinolone ointment (KENALOG) 0.5 % APPLY TO AFFECTED AREA(S)  TOPICALLY 1 TO 2 TIMES  DAILY   No facility-administered encounter medications on file as of 11/18/2020.    PHYSICAL EXAM/ROS  Current weight 273 pounds height is 5 feet 10 inches  General: NAD,Left eye blindness,cooperative Cardiovascular: regular rate and rhythm, no chest pain reported Pulmonary: no cough, no shortness of breath, clear ant/post fields, normal respiratory effort;on oxygen supplementation 4 L/min Abdomen: soft, non tender, positive bowel sounds in all quadrants GU: denies dysuria, no suprapubic tenderness Extremities: no edema, no joint deformities Skin: no rashes to exposed skin Neurological: Weakness but otherwise non focal  Note:  Portions of this note were generated with Lobbyist. Dictation errors may occur despite attempts at proofreading.  Teodoro Spray, NP

## 2020-12-03 ENCOUNTER — Ambulatory Visit: Payer: Medicare Other | Admitting: Physician Assistant

## 2020-12-17 ENCOUNTER — Telehealth: Payer: Self-pay

## 2020-12-17 MED ORDER — APIXABAN 5 MG PO TABS: 5.0000 mg | ORAL_TABLET | Freq: Two times a day (BID) | ORAL | 0 refills | Status: DC

## 2020-12-17 NOTE — Telephone Encounter (Signed)
Left message [facility] supply. Please let me know if you need it sent to [facility] also.  [name] called back pt's wife and said [facility] just [facility] and said [facility] Eliquis. She asked if I could send Rx to [facility] instead due to pt needs it tonight. Told her okay I will send it now. Pt verbalized understanding. Rx sent.

## 2020-12-17 NOTE — Telephone Encounter (Signed)
MEDICATION:   ELIQUIS 5 MG TABS tablet  PHARMACY:  [id number] [place], [place] - [address] Phone:  [phone]  Fax:  [phone]       Comments:  Patients wife called in and said they need [facility] since his ELIQUIS 5 MG TABS tablet from [facility] will not be to them in time and she doesn't want him to run out.     **Let patient know to contact pharmacy at the end of the day to make sure medication is ready. **  ** Please notify patient to allow 48-72 hours to process**  **Encourage patient to contact the pharmacy for refills or they can request refills through [facility]**

## 2021-01-01 ENCOUNTER — Telehealth: Payer: Self-pay

## 2021-01-01 NOTE — Telephone Encounter (Signed)
Spoke to pt's wife [name], asked her if the test she did on him was a home test? [name] said yes, it was negative. Told her to monitor his symptoms and watch his breathing and oxygen level. Told her it can take up to two weeks for him to become positive if he is going to get it. Told her I would like for her to bring him by next week when she is feeling better so we can do a PCR test on him to make sure he is negative. Just let me know when you can come and I will come out to the car and swab him. [name] verbalized understanding and will let me know.

## 2021-01-01 NOTE — Telephone Encounter (Signed)
Patients wife called in and stated the patient was negative for Covid 19 and that his only symptoms are Snotty nose and Sneezing.

## 2021-01-11 ENCOUNTER — Other Ambulatory Visit: Payer: Self-pay | Admitting: Physician Assistant

## 2021-01-27 ENCOUNTER — Encounter: Payer: Self-pay | Admitting: Cardiovascular Disease

## 2021-01-27 ENCOUNTER — Ambulatory Visit: Payer: Medicare Other | Admitting: Cardiovascular Disease

## 2021-01-27 ENCOUNTER — Other Ambulatory Visit: Payer: Self-pay

## 2021-01-27 DIAGNOSIS — I10 Essential (primary) hypertension: Secondary | ICD-10-CM | POA: Diagnosis not present

## 2021-01-27 DIAGNOSIS — I480 Paroxysmal atrial fibrillation: Secondary | ICD-10-CM

## 2021-01-27 DIAGNOSIS — E782 Mixed hyperlipidemia: Secondary | ICD-10-CM

## 2021-01-27 DIAGNOSIS — E785 Hyperlipidemia, unspecified: Secondary | ICD-10-CM | POA: Insufficient documentation

## 2021-01-27 DIAGNOSIS — I350 Nonrheumatic aortic (valve) stenosis: Secondary | ICD-10-CM

## 2021-01-27 DIAGNOSIS — G4733 Obstructive sleep apnea (adult) (pediatric): Secondary | ICD-10-CM | POA: Diagnosis not present

## 2021-01-27 NOTE — Assessment & Plan Note (Signed)
History of obstructive sleep apnea on BiPAP 

## 2021-01-27 NOTE — Assessment & Plan Note (Signed)
2D echo performed 04/27/2020 revealed normal LV systolic function, moderate concentric LVH with mild aortic stenosis.

## 2021-01-27 NOTE — Assessment & Plan Note (Signed)
History of mild hyperlipidemia with lipid profile performed 02/21/2020 revealing a total cholesterol of 201, LDL of 124 and HDL 51.  He is not on a statin drug.  Given his age and lack of history of CAD I do not feel compelled to begin him on a statin drug at this time.

## 2021-01-27 NOTE — Assessment & Plan Note (Signed)
History of essential hypertension a blood pressure measured today of 123/60.  He is on metoprolol.

## 2021-01-27 NOTE — Progress Notes (Signed)
01/27/2021 Maurice Reed   1940/12/24  [id number]  Primary Physician Inda Coke, [place] Primary Cardiologist: Lorretta Harp MD Lupe Carney, [name]  HPI:  Maurice Reed is a 80 y.o.  moderately overweight married Caucasian male (wife is [name]), father of 49, grandfather 95 grandchildren who is retired from working in the [employer]. He was referred by Inda Coke, PA-C to be established in our practice because of prior cardiac history.I last saw him in the office  07/24/2020.Marland Kitchen He has a greater than 100-pack-year history tobacco abuse having quit in 1985 and smoked 3 packs a day at that time. He does have treated hypertension, obstructive sleep apnea on BiPAP. He does wear oxygen nightly. He has chronic [name] and a history of remote PE on Eliquis oral anticoagulation. He is never had a heart attack or stroke. He has chronic shortness of breath but denies chest pain.  Since I saw himin the office4 months ago he has remained stable. He has seen [facility] [place], [place] in the office in follow-up. We have been adjusting his diuretics for chronic lower extremity edema probably related to diastolic dysfunction. Is now on torsemide 40 mg in the morning and 20 in the evening with as needed torsemide for weight gain. Recent lab work performed a week ago was acceptable. He did have an echo performed 04/27/2020 revealing normal LV systolic function, moderate concentric LVH with mild aortic stenosis. He has persistent [name] on Eliquis oral anticoagulation.  Since I saw him 6 months ago he has done well.  He continues on oxygen for his COPD.  He is somewhat unsteady on his feet because of peripheral neuropathy and has had neurologic evaluation for this.  He is on torsemide for lower extremity edema.  He gets rare chest pressure.   Current Meds  Medication Sig  . Ascorbic Acid (VITAMIN C) 500 MG CAPS Take 1 capsule by mouth daily.  . Atropine Sulfate-NaCl 0.01-0.9  % SOLN Apply 1 drop to eye at bedtime.   . Cholecalciferol (VITAMIN D3) 3000 units TABS Take by mouth.  . clobetasol (TEMOVATE) 0.05 % external solution APPLY TO AFFECTED AREA(S)  ON SCALP TOPICALLY ONE TO  TWO TIMES A DAY AS NEEDED  . desonide (DESOWEN) 0.05 % lotion Apply topically.  . docusate sodium (COLACE) 100 MG capsule Take 100 mg by mouth daily.   Marland Kitchen doxycycline (VIBRA-TABS) 100 MG tablet TAKE 1 TABLET BY MOUTH  TWICE DAILY  . ELIQUIS 5 MG TABS tablet TAKE 1 TABLET(5 MG) BY MOUTH TWICE DAILY  . erythromycin ophthalmic ointment APP THIN LAYER IN OS BID  . ipratropium-albuterol (DUONEB) 0.5-2.5 (3) MG/3ML SOLN Take 3 mLs by nebulization every 6 (six) hours as needed.  . magnesium oxide (MAG-OX) 400 MG tablet TAKE 1 TABLET BY MOUTH TWICE A DAY  . metoprolol succinate (TOPROL-XL) 25 MG 24 hr tablet TAKE 1 TABLET BY MOUTH  DAILY  . mometasone (NASONEX) 50 MCG/ACT nasal spray Place 2 sprays into the nose daily. (Patient taking differently: Place 2 sprays into the nose as needed.)  . NARCAN 4 MG/0.1ML LIQD nasal spray kit INSTILL ONE SPRAY PRN FOR ACCIDENTAL OVERDOSE  . nystatin ointment (MYCOSTATIN) Apply 1 application topically 2 (two) times daily.  Marland Kitchen oxyCODONE (OXY IR/ROXICODONE) 5 MG immediate release tablet Take 5 mg by mouth 2 (two) times daily as needed. for pain  . potassium chloride (MICRO-K) 10 MEQ CR capsule TAKE 2 CAPSULES BY MOUTH IN THE AM AND  1 CAPSULE IN THE EVENING  . prednisoLONE acetate (PRED FORTE) 1 % ophthalmic suspension Place 1 drop into the left eye 2 (two) times daily.   . tadalafil (CIALIS) 20 MG tablet Take 0.5-1 tablets (10-20 mg total) by mouth every other day as needed for erectile dysfunction.  . Tafluprost, PF, 0.0015 % SOLN Apply 1 drop to eye daily. Drop 1 drop into right at night  . tamsulosin (FLOMAX) 0.4 MG CAPS capsule TAKE 1 CAPSULE BY MOUTH AT  BEDTIME  . temazepam (RESTORIL) 15 MG capsule Take 1 capsule (15 mg total) by mouth at bedtime as needed.  .  torsemide (DEMADEX) 20 MG tablet TAKE 2 TABLETS BY MOUTH IN THE AM AND 1 TAB IN THE EVENING  . triamcinolone ointment (KENALOG) 0.5 % APPLY TO AFFECTED AREA(S)  TOPICALLY 1 TO 2 TIMES  DAILY  . [DISCONTINUED] Apremilast (OTEZLA) 30 MG TABS Take 1 tablet by mouth daily.     Allergies  Allergen Reactions  . Other Itching    Cats: watery eyes, itching, sneezing    Social History   Socioeconomic History  . Marital status: Married    Spouse name: Not on file  . Number of children: Not on file  . Years of education: Not on file  . Highest education level: Not on file  Occupational History  . Not on file  Tobacco Use  . Smoking status: Former Smoker    Packs/day: 3.00    Years: 25.00    Pack years: 75.00    Types: Cigarettes    Quit date: 11/28/1984    Years since quitting: 36.1  . Smokeless tobacco: Never Used  Vaping Use  . Vaping Use: Never used  Substance and Sexual Activity  . Alcohol use: Never  . Drug use: Yes    Types: Marijuana    Comment: Gummies   . Sexual activity: Yes  Other Topics Concern  . Not on file  Social History Narrative   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16)   Married to [employer] ([place] patient)   7 children   Currently lives with son in [place], [place]; all their things are in storage; plan to travel and stay with kids but keep their home base in the [place] area   Right Handed   Social Determinants of Health   Financial Resource Strain: Not on file  Food Insecurity: Not on file  Transportation Needs: Not on file  Physical Activity: Not on file  Stress: Not on file  Social Connections: Not on file  Intimate Partner Violence: Not on file     Review of Systems: General: negative for chills, fever, night sweats or weight changes.  Cardiovascular: negative for chest pain, dyspnea on exertion, edema, orthopnea, palpitations, paroxysmal nocturnal dyspnea or shortness of breath Dermatological:  negative for rash Respiratory: negative for cough or wheezing Urologic: negative for hematuria Abdominal: negative for nausea, vomiting, diarrhea, bright red blood per rectum, melena, or hematemesis Neurologic: negative for visual changes, syncope, or dizziness All other systems reviewed and are otherwise negative except as noted above.    Blood pressure 123/60, pulse 90, height '5\' 10"'  (1.778 m), weight 264 lb 6.4 oz (119.9 kg), SpO2 98 %.  General appearance: alert and no distress Neck: no adenopathy, no carotid bruit, no JVD, supple, symmetrical, trachea midline and thyroid not enlarged, symmetric, no tenderness/mass/nodules Lungs: clear to auscultation bilaterally Heart: irregularly irregular rhythm Extremities: extremities normal, atraumatic, no cyanosis or edema  Pulses: 2+ and symmetric Skin: Skin color, texture, turgor normal. No rashes or lesions Neurologic: Alert and oriented X 3, normal strength and tone. Normal symmetric reflexes. Normal coordination and gait  EKG atrial fibrillation with a ventricular sponsor of 90.  I personally reviewed this EKG.  ASSESSMENT AND PLAN:   OSA treated with BiPAP History of obstructive sleep apnea on BiPAP  Essential hypertension History of essential hypertension a blood pressure measured today of 123/60.  He is on metoprolol.  Atrial fibrillation ([place]) History of PAF currently in [name] on Eliquis oral anticoagulation, rate controlled  Hyperlipidemia History of mild hyperlipidemia with lipid profile performed 02/21/2020 revealing a total cholesterol of 201, LDL of 124 and HDL 51.  He is not on a statin drug.  Given his age and lack of history of CAD I do not feel compelled to begin him on a statin drug at this time.  Mild aortic stenosis 2D echo performed 04/27/2020 revealed normal LV systolic function, moderate concentric LVH with mild aortic stenosis.      Lorretta Harp MD FACP,FACC,FAHA, [facility] 01/27/2021 11:08 AM

## 2021-01-27 NOTE — Patient Instructions (Signed)

## 2021-01-27 NOTE — Assessment & Plan Note (Signed)
History of PAF currently in [name] on Eliquis oral anticoagulation, rate controlled

## 2021-02-18 ENCOUNTER — Other Ambulatory Visit: Payer: Medicare Other | Admitting: Hospice

## 2021-02-18 ENCOUNTER — Other Ambulatory Visit: Payer: Self-pay

## 2021-02-18 DIAGNOSIS — G8929 Other chronic pain: Secondary | ICD-10-CM

## 2021-02-18 DIAGNOSIS — Z515 Encounter for palliative care: Secondary | ICD-10-CM

## 2021-02-18 DIAGNOSIS — M25512 Pain in left shoulder: Secondary | ICD-10-CM

## 2021-02-18 NOTE — Progress Notes (Signed)
[facility] Palliative Care Consult Note Telephone: [phone]  Fax: [phone]  PATIENT NAME: Maurice Reed DOB: 06/15/1941 MRN: [id number]  PRIMARY CARE PROVIDER:   Inda Coke, [place], [place], [place] [address] [place],  [place] [address]  REFERRING PROVIDER: Inda Coke, [place] Inda Coke, [place] [address] [place],  [place] [address]  RESPONSIBLE PARTY:Self [id number] [phone] - spouse - best to call Extended Emergency Contact Information Primary Emergency Contact: Danile, [place] Mobile Phone: [phone] Relation: Spouse Secondary Emergency Contact: Perrin Maltese Mobile Phone: [phone] Relation: Granddaughter  Visit is to build trust and highlight Palliative Medicine as specialized medical care for people living with serious illness, aimed at facilitating better quality of life through symptoms relief, assisting with advance care plan and establishing goals of care.   CHIEF COMPLAINT: Palliative focused visit/left should pain  RECOMMENDATIONS/PLAN:   CODE STATUS:CODE STATUS reviewed. Patient is a DO NOT RESUSCITATE.  GOALS OF CARE: Goals of care include to maximize quality of life and symptom management.Patient is not in denial of his declining health status and said he is ready to go when the good Lord calls him.Having lived this long, he wishes [place] be able [place].  MOST selections include DO NOT RESUSCITATE, limited additional intervention, IV fluids for defined trial., Antibiotics as indicated, no feeding tube. Patient/family open to Hospice service in the future when appropriate.  Visit consisted of counseling and education dealing with the complex and emotionally intense issues of symptom management and palliative care in the setting of serious and potentially life-threatening illness. Palliative care team will continue to support patient, patient's family,  and medical team.  I spent 16  minutes providing this consultation. More than 50% of the time in this consultation was spent on coordinating communication.  -------------------------------------------------------------------------------------------------------------------------------------------------- 3. Symptom management/Plan:  Left shoulder pain: Use of heating pad discussed.  Activity as tolerated, balance of rest and performance activity.  Continue oxycodone as ordered for pain, follow-up scheduled appointments at [facility] pain clinic. Shortness of breath: Related to Interstitial Lung Disease. Continue oxygen supplementation 3-4L/Min, Albuterol, Duoneb as ordered.  Patient in no respiratory distress during visit. Palliative will continue to monitor for symptom management/decline and make recommendations as needed. Return 2 months or prn. Encouraged to call provider sooner with any concerns.   HISTORY OF PRESENT ILLNESS:  Maurice Reed is a 80 y.o. male with multiple medical problems including acute on chronic shoulder pain, chronic over a year, likely related to history of arthritis, worse in the last month; aggravated with activity and helped with his medications. Pain in left shoulder radiates to his left hand with numbing tingling feeling.  During visit pain is 2 out of 10 (patient took oxycodone before visit) on a pain scale 0-10, 0 being no pain and 10 being worst pain ever.  Patient refuses Gabapentin because it makes him so drowsy he falls. He continues to go to [facility] management clinic- [facility] and is on Oxycodone 74m TID for pain.  History of interstitial lung disease, COPD, [name], hypertension, psoriasis-on Otezla, followed by dermatologist.  History obtained from review of EMR, discussion with patient/family.   Review and summarization of Epic records shows history from other than patient. Rest of 10 point ROS asked and negative.  Palliative Care was asked to  follow this patient by consultation request of WInda Coke PUtahto help address complex decision making in  the context of advance care planning and goals of care clarification.   CODE STATUS: DNR  PPS: 50%  HOSPICE ELIGIBILITY/DIAGNOSIS: TBD  PAST MEDICAL HISTORY:  Past Medical History:  Diagnosis Date  . Arthritis   . Atrial fibrillation ([place])   . 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 Diverticula, bladder   . Erectile dysfunction   . GERD (gastroesophageal reflux disease)   . Kidney stones   . Obesity   . Pulmonary embolism ([facility])     SOCIAL HX: _0  Patient at home  for ongoing care   FAMILY HX:  Family History  Problem Relation Age of Onset  . Arthritis Mother   . Hearing loss Mother   . Hypertension Mother   . Heart disease Mother   . Stroke Mother   . Arthritis Father   . Hearing loss Father   . Heart disease Father   . Hypertension Father   . Heart attack Father   . Kidney disease Father   . Stroke Father     Review lab tests/diagnostics No results for input(s): WBC, HGB, HCT, PLT, MCV in the last 168 hours. No results for input(s): NA, K, CL, CO2, BUN, CREATININE, GLUCOSE in the last 168 hours. Latest GFR by Cockcroft Gault (not valid in AKI or ESRD) CrCl cannot be calculated (Patient's most recent lab result is older than the maximum 21 days allowed.). No results for input(s): AST, ALT, ALKPHOS, GGT in the last 168 hours.  Invalid input(s): TBILI, CONJBILI, ALB, TOTALPROTEIN No components found for: ALB No results for input(s): APTT, INR in the last 168 hours.  Invalid input(s): PTPATIENT No results for input(s): BNP, PROBNP in the last 168 hours.  ALLERGIES:  Allergies  Allergen Reactions  . Other Itching    Cats: watery eyes, itching, sneezing      PERTINENT MEDICATIONS:  Outpatient Encounter Medications as of 02/18/2021  Medication Sig  . Ascorbic Acid (VITAMIN C) 500 MG CAPS Take 1 capsule by mouth daily.  .  Atropine Sulfate-NaCl 0.01-0.9 % SOLN Apply 1 drop to eye at bedtime.   . Cholecalciferol (VITAMIN D3) 3000 units TABS Take by mouth.  . clobetasol (TEMOVATE) 0.05 % external solution APPLY TO AFFECTED AREA(S)  ON SCALP TOPICALLY ONE TO  TWO TIMES A DAY AS NEEDED  . desonide (DESOWEN) 0.05 % lotion Apply topically.  . docusate sodium (COLACE) 100 MG capsule Take 100 mg by mouth daily.   Marland Kitchen doxycycline (VIBRA-TABS) 100 MG tablet TAKE 1 TABLET BY MOUTH  TWICE DAILY  . ELIQUIS 5 MG TABS tablet TAKE 1 TABLET(5 MG) BY MOUTH TWICE DAILY  . erythromycin ophthalmic ointment APP THIN LAYER IN OS BID  . ipratropium-albuterol (DUONEB) 0.5-2.5 (3) MG/3ML SOLN Take 3 mLs by nebulization every 6 (six) hours as needed.  . magnesium oxide (MAG-OX) 400 MG tablet TAKE 1 TABLET BY MOUTH TWICE A DAY  . metoprolol succinate (TOPROL-XL) 25 MG 24 hr tablet TAKE 1 TABLET BY MOUTH  DAILY  . mometasone (NASONEX) 50 MCG/ACT nasal spray Place 2 sprays into the nose daily. (Patient taking differently: Place 2 sprays into the nose as needed.)  . NARCAN 4 MG/0.1ML LIQD nasal spray kit INSTILL ONE SPRAY PRN FOR ACCIDENTAL OVERDOSE  . nystatin ointment (MYCOSTATIN) Apply 1 application topically 2 (two) times daily.  Marland Kitchen oxyCODONE (OXY IR/ROXICODONE) 5 MG immediate release tablet Take 5 mg by mouth 2 (two) times daily as needed. for pain  . potassium chloride (MICRO-K)  10 MEQ CR capsule TAKE 2 CAPSULES BY MOUTH IN THE AM AND 1 CAPSULE IN THE EVENING  . prednisoLONE acetate (PRED FORTE) 1 % ophthalmic suspension Place 1 drop into the left eye 2 (two) times daily.   . tadalafil (CIALIS) 20 MG tablet Take 0.5-1 tablets (10-20 mg total) by mouth every other day as needed for erectile dysfunction.  . Tafluprost, PF, 0.0015 % SOLN Apply 1 drop to eye daily. Drop 1 drop into right at night  . tamsulosin (FLOMAX) 0.4 MG CAPS capsule TAKE 1 CAPSULE BY MOUTH AT  BEDTIME  . temazepam (RESTORIL) 15 MG capsule Take 1 capsule (15 mg total) by  mouth at bedtime as needed.  . torsemide (DEMADEX) 20 MG tablet TAKE 2 TABLETS BY MOUTH IN THE AM AND 1 TAB IN THE EVENING  . triamcinolone ointment (KENALOG) 0.5 % APPLY TO AFFECTED AREA(S)  TOPICALLY 1 TO 2 TIMES  DAILY   No facility-administered encounter medications on file as of 02/18/2021.    ROS  General: NAD EYES: No vision changes ENMT: No dysphagia no xerostomia Cardiovascular: No chest pain Pulmonary: No cough, SOB  Abdomen:no constipation or diarrhea GU: No dysuria or urinary frequency MSK:   ROM limitations, pain in left shoulder, no falls reported Skin: No rashes or wounds Neurological: weakness Psych:  positive mood Heme/lymph/immuno: No bruises or abnormal bleeding   PHYSICAL EXAM BP 122/70 R 18 02 99% 3L/Min, P 60 General: In no acute distress,  Cardiovascular: regular rate and rhythm; no edema in BLE Pulmonary: no cough, no increased work of breathing, normal respiratory effort on oxygen supplementation 3 L/min Abdomen: soft, non tender, positive bowel sounds in all quadrants GU:  no suprapubic tenderness Eyes: Normal lids, no discharge, sclera anicteric ENMT: Moist mucous membranes Musculoskeletal: Limited range of motion left shoulder Skin: no rash to visible skin, warm without cyanosis Psych: non-anxious affect Neurological: Weakness but otherwise non focal Heme/lymph/immuno: no bruises, no bleeding  Thank you for the opportunity to participate in the care of JANIEL CRISOSTOMO Please call our office at [phone] if we can be of additional assistance.  Note: Portions of this note were generated with [employer]. Dictation errors may occur despite best attempts at proofreading.  Teodoro Spray, NP

## 2021-02-22 ENCOUNTER — Other Ambulatory Visit: Payer: Self-pay | Admitting: Physician Assistant

## 2021-02-23 NOTE — Telephone Encounter (Signed)
Pt requesting refill for Temazepam 15 mg.

## 2021-02-25 ENCOUNTER — Other Ambulatory Visit: Payer: Self-pay | Admitting: Physician Assistant

## 2021-02-25 NOTE — Telephone Encounter (Signed)
Last refill 01/25/2021 Last OV 10/14/2020 dx Bilateral arm pain

## 2021-02-26 MED ORDER — TEMAZEPAM 15 MG PO CAPS: ORAL_CAPSULE | ORAL | 0 refills | Status: DC

## 2021-02-26 NOTE — Addendum Note (Signed)
Addended by: Jimmye Norman on: 02/26/2021 07:55 AM   Modules accepted: Orders

## 2021-02-26 NOTE — Telephone Encounter (Signed)
Please resend Rx failed to pharmacy. I have loaded it.

## 2021-03-01 ENCOUNTER — Encounter: Payer: Self-pay | Admitting: Physician Assistant

## 2021-03-01 ENCOUNTER — Ambulatory Visit (INDEPENDENT_AMBULATORY_CARE_PROVIDER_SITE_OTHER): Payer: Medicare Other | Admitting: Physician Assistant

## 2021-03-01 VITALS — BP 130/78 | HR 82 | Temp 98.0°F | Wt 270.8 lb

## 2021-03-01 DIAGNOSIS — M79602 Pain in left arm: Secondary | ICD-10-CM

## 2021-03-01 DIAGNOSIS — N529 Male erectile dysfunction, unspecified: Secondary | ICD-10-CM | POA: Diagnosis not present

## 2021-03-01 DIAGNOSIS — M79601 Pain in right arm: Secondary | ICD-10-CM

## 2021-03-01 DIAGNOSIS — E8881 Metabolic syndrome: Secondary | ICD-10-CM | POA: Diagnosis not present

## 2021-03-01 DIAGNOSIS — E782 Mixed hyperlipidemia: Secondary | ICD-10-CM | POA: Diagnosis not present

## 2021-03-01 MED ORDER — TADALAFIL 20 MG PO TABS: 10.0000 mg | ORAL_TABLET | ORAL | 11 refills | Status: DC | PRN

## 2021-03-01 NOTE — Patient Instructions (Signed)
It was great to see you!  Update blood work today.  A referral has been placed for you to see Dr. [name] with [facility]. Someone from there office will be in touch soon regarding your appointment with him. His location: Psychiatrist Medicine at [facility] [address] on the 1st floor.   Phone number [phone], Fax [phone].  This location is across the street from the entrance to [employer] and in the same complex as the [facility] and [employer] bank   Take care,  Jarold Motto PA-C

## 2021-03-01 NOTE — Progress Notes (Signed)
Maurice Reed is a 80 y.o. male here for a prexisting problem.   History of Present Illness:   Chief Complaint  Patient presents with  . Shoulder Pain    Both shoulders. Pain has worsened over the past 3 months, left is worse than right. Pain from left shoulder goes down arm into hand, goes numb hard to close fist.    HPI   Shoulder pain Pt c/o bilateral shoulder pain x past few years but getting worse over the past 3 months. L>R.  Notices if most when he is reaching back for things and also at night, has difficulty getting comfortable. He does do upper arm exercises throughout the day. Will go days without complaining and then have issues. He is seeing pain mgmt for chronic pain so hasn't taken any dedicated pain medication for this outside of his normal regimen. Worked in [employer] for several years and has significant hx of overhead repetitive motion.  HLD Sees Dr. Gwenlyn Found for this, per his most recent note -- "He is not on a statin drug. Given his age and lack of history of CAD I do not feel compelled to begin him on a statin drug at this time."  ED Uses cialis 10-20 mg prn. Effective. Needs refill.  Insulin resistance 6 month follow-up. Current DM meds: none. Blood sugars at home are: not checked. Denies: hypoglycemic or hyperglycemic episodes or symptoms. Weight has been overall stable.  Lab Results  Component Value Date   HGBA1C 5.9 (H) 07/22/2020   Wt Readings from Last 4 Encounters:  03/01/21 270 lb 12.8 oz (122.8 kg)  01/27/21 264 lb 6.4 oz (119.9 kg)  10/14/20 268 lb (121.6 kg)  09/08/20 271 lb (122.9 kg)     Past Medical History:  Diagnosis Date  . Arthritis   . Atrial fibrillation ([place])   . 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 Diverticula, bladder   . Erectile dysfunction   . GERD (gastroesophageal reflux disease)   . Kidney stones   . Obesity   . Pulmonary embolism ([facility])      Social History   Tobacco Use  . Smoking status: Former  Smoker    Packs/day: 3.00    Years: 25.00    Pack years: 75.00    Types: Cigarettes    Quit date: 11/28/1984    Years since quitting: 36.2  . Smokeless tobacco: Never Used  Vaping Use  . Vaping Use: Never used  Substance Use Topics  . Alcohol use: Never  . Drug use: Yes    Types: Marijuana    Comment: Gummies     Past Surgical History:  Procedure Laterality Date  . APPENDECTOMY  1963  . LITHOTRIPSY  1987  . NOSE SURGERY  2003  . TONSILLECTOMY AND ADENOIDECTOMY  1948  . UMBILICAL HERNIA REPAIR  1984    Family History  Problem Relation Age of Onset  . Arthritis Mother   . Hearing loss Mother   . Hypertension Mother   . Heart disease Mother   . Stroke Mother   . Arthritis Father   . Hearing loss Father   . Heart disease Father   . Hypertension Father   . Heart attack Father   . Kidney disease Father   . Stroke Father     Allergies  Allergen Reactions  . Other Itching    Cats: watery eyes, itching, sneezing    Current Medications:   Current Outpatient Medications:  .  Apremilast (OTEZLA PO), Take 30 mg by mouth 2 (two) times daily., Disp: , Rfl:  .  Ascorbic Acid (VITAMIN C) 500 MG CAPS, Take 1 capsule by mouth daily., Disp: , Rfl:  .  Atropine Sulfate-NaCl 0.01-0.9 % SOLN, Apply 1 drop to eye at bedtime. , Disp: , Rfl:  .  Cholecalciferol (VITAMIN D3) 3000 units TABS, Take by mouth., Disp: , Rfl:  .  clobetasol (TEMOVATE) 0.05 % external solution, APPLY TO AFFECTED AREA(S)  ON SCALP TOPICALLY ONE TO  TWO TIMES A DAY AS NEEDED, Disp: 150 mL, Rfl: 4 .  desonide (DESOWEN) 0.05 % lotion, Apply topically., Disp: , Rfl:  .  docusate sodium (COLACE) 100 MG capsule, Take 100 mg by mouth daily. , Disp: , Rfl:  .  doxycycline (VIBRA-TABS) 100 MG tablet, TAKE 1 TABLET BY MOUTH  TWICE DAILY, Disp: 180 tablet, Rfl: 0 .  ELIQUIS 5 MG TABS tablet, TAKE 1 TABLET(5 MG) BY MOUTH TWICE DAILY, Disp: 60 tablet, Rfl: 2 .  erythromycin ophthalmic ointment, APP THIN LAYER IN OS BID,  Disp: , Rfl: 1 .  ipratropium-albuterol (DUONEB) 0.5-2.5 (3) MG/3ML SOLN, Take 3 mLs by nebulization every 6 (six) hours as needed., Disp: 360 mL, Rfl: 3 .  magnesium oxide (MAG-OX) 400 MG tablet, TAKE 1 TABLET BY MOUTH TWICE A DAY, Disp: 180 tablet, Rfl: 2 .  metoprolol succinate (TOPROL-XL) 25 MG 24 hr tablet, TAKE 1 TABLET BY MOUTH  DAILY, Disp: 90 tablet, Rfl: 3 .  mometasone (NASONEX) 50 MCG/ACT nasal spray, Place 2 sprays into the nose daily. (Patient taking differently: Place 2 sprays into the nose as needed.), Disp: 51 g, Rfl: 2 .  NARCAN 4 MG/0.1ML LIQD nasal spray kit, INSTILL ONE SPRAY PRN FOR ACCIDENTAL OVERDOSE, Disp: , Rfl: 0 .  nystatin ointment (MYCOSTATIN), Apply 1 application topically 2 (two) times daily., Disp: 30 g, Rfl: 1 .  oxyCODONE (OXY IR/ROXICODONE) 5 MG immediate release tablet, Take 5 mg by mouth 2 (two) times daily as needed. for pain, Disp: , Rfl: 0 .  potassium chloride (MICRO-K) 10 MEQ CR capsule, TAKE 2 CAPSULES BY MOUTH IN THE AM AND 1 CAPSULE IN THE EVENING, Disp: 90 capsule, Rfl: 6 .  prednisoLONE acetate (PRED FORTE) 1 % ophthalmic suspension, Place 1 drop into the left eye 2 (two) times daily. , Disp: , Rfl:  .  Tafluprost, PF, 0.0015 % SOLN, Apply 1 drop to eye daily. Drop 1 drop into right at night, Disp: , Rfl:  .  tamsulosin (FLOMAX) 0.4 MG CAPS capsule, TAKE 1 CAPSULE BY MOUTH AT  BEDTIME, Disp: 90 capsule, Rfl: 3 .  temazepam (RESTORIL) 15 MG capsule, TAKE 1 CAPSULE (15 MG TOTAL) BY MOUTH AT BEDTIME AS NEEDED FOR SLEEP., Disp: 30 capsule, Rfl: 0 .  torsemide (DEMADEX) 20 MG tablet, TAKE 2 TABLETS BY MOUTH IN THE AM AND 1 TAB IN THE EVENING, Disp: 270 tablet, Rfl: 3 .  triamcinolone ointment (KENALOG) 0.5 %, APPLY TO AFFECTED AREA(S)  TOPICALLY 1 TO 2 TIMES  DAILY, Disp: 45 g, Rfl: 2 .  tadalafil (CIALIS) 20 MG tablet, Take 0.5-1 tablets (10-20 mg total) by mouth every other day as needed for erectile dysfunction., Disp: 5 tablet, Rfl: 11   Review of  Systems:   ROS Negative unless otherwise specified per HPI.  Vitals:   Vitals:   03/01/21 1426  BP: 130/78  Pulse: 82  Temp: 98 F (36.7 C)  TempSrc: Temporal  SpO2: 98%  Weight: 270 lb 12.8  oz (122.8 kg)     Body mass index is 38.86 kg/m.  Physical Exam:   Physical Exam Vitals and nursing note reviewed.  Constitutional:      General: He is not in acute distress.    Appearance: He is well-developed. He is not ill-appearing or toxic-appearing.  Cardiovascular:     Rate and Rhythm: Normal rate and regular rhythm.     Pulses: Normal pulses.     Heart sounds: Normal heart sounds, S1 normal and S2 normal.  Pulmonary:     Effort: Pulmonary effort is normal.     Breath sounds: Normal breath sounds.  Musculoskeletal:     Comments: Bilateral shoulders: No visible abnormalities Normal ROM No pain with resisted abduction  Skin:    General: Skin is warm and dry.  Neurological:     Mental Status: He is alert.     GCS: GCS eye subscore is 4. GCS verbal subscore is 5. GCS motor subscore is 6.  Psychiatric:        Speech: Speech normal.        Behavior: Behavior normal. Behavior is cooperative.     Assessment and Plan:   Sven was seen today for shoulder pain.  Diagnoses and all orders for this visit:  Bilateral arm pain Referral to sports medicine. Undoubtedly has arthritis. He is interested in shoulder injection if candidate. Will defer imaging and intervention to sports med. -     CBC with Differential/Platelet -     Comprehensive metabolic panel -     Ambulatory referral to Sports Medicine  Mixed hyperlipidemia Update lipid panel. Will provide recommendations accordingly. -     Lipid panel  Insulin resistance Update A1c. Will provide recommendations accordingly. -     Hemoglobin A1c  Erectile dysfunction, unspecified erectile dysfunction type Controlled with cialis.  Refill provided.  Other orders -     tadalafil (CIALIS) 20 MG tablet; Take 0.5-1  tablets (10-20 mg total) by mouth every other day as needed for erectile dysfunction.   CMA or LPN served as scribe during this visit. History, Physical, and Plan performed by medical provider. The above documentation has been reviewed and is accurate and complete.  Time spent with patient today was 25 minutes which consisted of chart review, discussing diagnosis, work up, treatment answering questions and documentation.   Inda Coke, PA-C

## 2021-03-02 LAB — CBC WITH DIFFERENTIAL/PLATELET
Basophils Absolute: 0.1 10*3/uL (ref 0.0–0.1)
Basophils Relative: 1 % (ref 0.0–3.0)
Eosinophils Absolute: 0.1 10*3/uL (ref 0.0–0.7)
Eosinophils Relative: 1.7 % (ref 0.0–5.0)
HCT: 38.4 % — ABNORMAL LOW (ref 39.0–52.0)
Hemoglobin: 13 g/dL (ref 13.0–17.0)
Lymphocytes Relative: 25 % (ref 12.0–46.0)
Lymphs Abs: 1.6 10*3/uL (ref 0.7–4.0)
MCHC: 33.9 g/dL (ref 30.0–36.0)
MCV: 103.7 fl — ABNORMAL HIGH (ref 78.0–100.0)
Monocytes Absolute: 0.6 10*3/uL (ref 0.1–1.0)
Monocytes Relative: 10.2 % (ref 3.0–12.0)
Neutro Abs: 3.9 10*3/uL (ref 1.4–7.7)
Neutrophils Relative %: 62.1 % (ref 43.0–77.0)
Platelets: 199 10*3/uL (ref 150.0–400.0)
RBC: 3.7 Mil/uL — ABNORMAL LOW (ref 4.22–5.81)
RDW: 13.4 % (ref 11.5–15.5)
WBC: 6.3 10*3/uL (ref 4.0–10.5)

## 2021-03-02 LAB — COMPREHENSIVE METABOLIC PANEL
ALT: 13 U/L (ref 0–53)
AST: 15 U/L (ref 0–37)
Albumin: 3.6 g/dL (ref 3.5–5.2)
Alkaline Phosphatase: 80 U/L (ref 39–117)
BUN: 15 mg/dL (ref 6–23)
CO2: 37 mEq/L — ABNORMAL HIGH (ref 19–32)
Calcium: 10 mg/dL (ref 8.4–10.5)
Chloride: 98 mEq/L (ref 96–112)
Creatinine, Ser: 0.86 mg/dL (ref 0.40–1.50)
GFR: 82.05 mL/min (ref >60.00)
Glucose, Bld: 125 mg/dL — ABNORMAL HIGH (ref 70–99)
Potassium: 3.7 mEq/L (ref 3.5–5.1)
Sodium: 140 mEq/L (ref 135–145)
Total Bilirubin: 0.4 mg/dL (ref 0.2–1.2)
Total Protein: 6.4 g/dL (ref 6.0–8.3)

## 2021-03-02 LAB — LIPID PANEL
Cholesterol: 185 mg/dL (ref 0–200)
HDL: 48.8 mg/dL (ref >39.00)
LDL Cholesterol: 107 mg/dL — ABNORMAL HIGH (ref 0–99)
NonHDL: 135.74
Total CHOL/HDL Ratio: 4
Triglycerides: 146 mg/dL (ref 0.0–149.0)
VLDL: 29.2 mg/dL (ref 0.0–40.0)

## 2021-03-02 LAB — HEMOGLOBIN A1C: Hgb A1c MFr Bld: 5.8 % (ref 4.6–6.5)

## 2021-03-04 ENCOUNTER — Other Ambulatory Visit: Payer: Self-pay | Admitting: Family Medicine

## 2021-03-04 ENCOUNTER — Ambulatory Visit: Payer: Medicare Other | Admitting: Family Medicine

## 2021-03-04 ENCOUNTER — Other Ambulatory Visit: Payer: Self-pay | Admitting: Physician Assistant

## 2021-03-04 NOTE — Progress Notes (Deleted)
I, Maurice Reed, LAT, ATC acting as a scribe for Maurice Graham, MD.  Subjective:    I'm seeing this patient as a consultation for Maurice Reed, [name]. Note will be routed back to referring provider/PCP.  CC: Bilateral shoulder pain  HPI: Pt is an 80 y/o male c/o bilat shoulder pain ongoing for a few years, but worsening over the past 3 months, L>R. Pt locates pain to // Of note, pt is being seen by pain management for chronic pain. 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  Neck pain: Radiates: yes- L, down to arm and hand UE Numbness/tingling: yes UE Weakness: yes-  Aggravates: Treatments tried:  Dx imaging: 07/25/20 C-spine MRI  Past medical history, Surgical history, Family history, Social history, Allergies, and medications have been entered into the medical record, reviewed. ***  Review of Systems: No new headache, visual changes, nausea, vomiting, diarrhea, constipation, dizziness, abdominal pain, skin rash, fevers, chills, night sweats, weight loss, swollen lymph nodes, body aches, joint swelling, muscle aches, chest pain, shortness of breath, mood changes, visual or auditory hallucinations.   Objective:   There were no vitals filed for this visit. General: Well Developed, well nourished, and in no acute distress.  Neuro/Psych: Alert and oriented x3, extra-ocular muscles intact, able to move all 4 extremities, sensation grossly intact. Skin: Warm and dry, no rashes noted.  Respiratory: Not using accessory muscles, speaking in full sentences, trachea midline.  Cardiovascular: Pulses palpable, no extremity edema. Abdomen: Does not appear distended. MSK: ***  Lab and Radiology Results Results for orders placed or performed in visit on 03/01/21 (from the past 72 hour(s))  CBC with Differential/Platelet     Status: Abnormal   Collection Time: 03/01/21  3:06 PM  Result Value Ref Range   WBC 6.3 4.0 - 10.5 K/uL   RBC 3.70  (L) 4.22 - 5.81 Mil/uL   Hemoglobin 13.0 13.0 - 17.0 g/dL   HCT 40.9 (L) 81.1 - 91.4 %   MCV 103.7 (H) 78.0 - 100.0 fl   MCHC 33.9 30.0 - 36.0 g/dL   RDW 78.2 95.6 - 21.3 %   Platelets 199.0 150.0 - 400.0 K/uL   Neutrophils Relative % 62.1 43.0 - 77.0 %   Lymphocytes Relative 25.0 12.0 - 46.0 %   Monocytes Relative 10.2 3.0 - 12.0 %   Eosinophils Relative 1.7 0.0 - 5.0 %   Basophils Relative 1.0 0.0 - 3.0 %   Neutro Abs 3.9 1.4 - 7.7 K/uL   Lymphs Abs 1.6 0.7 - 4.0 K/uL   Monocytes Absolute 0.6 0.1 - 1.0 K/uL   Eosinophils Absolute 0.1 0.0 - 0.7 K/uL   Basophils Absolute 0.1 0.0 - 0.1 K/uL  Comprehensive metabolic panel     Status: Abnormal   Collection Time: 03/01/21  3:06 PM  Result Value Ref Range   Sodium 140 135 - 145 mEq/L   Potassium 3.7 3.5 - 5.1 mEq/L   Chloride 98 96 - 112 mEq/L   CO2 37 (H) 19 - 32 mEq/L   Glucose, Bld 125 (H) 70 - 99 mg/dL   BUN 15 6 - 23 mg/dL   Creatinine, Ser 0.86 0.40 - 1.50 mg/dL   Total Bilirubin 0.4 0.2 - 1.2 mg/dL   Alkaline Phosphatase 80 39 - 117 U/L   AST 15 0 - 37 U/L   ALT 13 0 - 53 U/L   Total Protein 6.4 6.0 - 8.3 g/dL   Albumin 3.6 3.5 -  5.2 g/dL   GFR 44.81 >85.63 mL/min    Comment: Calculated using the CKD-EPI Creatinine Equation (2021)   Calcium 10.0 8.4 - 10.5 mg/dL  Hemoglobin J4H     Status: None   Collection Time: 03/01/21  3:06 PM  Result Value Ref Range   Hgb A1c MFr Bld 5.8 4.6 - 6.5 %    Comment: Glycemic Control Guidelines for People with Diabetes:Non Diabetic:  <6%Goal of Therapy: <7%Additional Action Suggested:  >8%   Lipid panel     Status: Abnormal   Collection Time: 03/01/21  3:06 PM  Result Value Ref Range   Cholesterol 185 0 - 200 mg/dL    Comment: ATP III Classification       Desirable:  < 200 mg/dL               Borderline High:  200 - 239 mg/dL          High:  > = 702 mg/dL   Triglycerides 637.8 0.0 - 149.0 mg/dL    Comment: Normal:  <588 mg/dLBorderline High:  150 - 199 mg/dL   HDL 50.27 >74.12 mg/dL    VLDL 87.8 0.0 - 67.6 mg/dL   LDL Cholesterol 720 (H) 0 - 99 mg/dL   Total CHOL/HDL Ratio 4     Comment:                Men          Women1/2 Average Risk     3.4          3.3Average Risk          5.0          4.42X Average Risk          9.6          7.13X Average Risk          15.0          11.0                       NonHDL 135.74     Comment: NOTE:  Non-HDL goal should be 30 mg/dL higher than patient's LDL goal (i.e. LDL goal of < 70 mg/dL, would have non-HDL goal of < 100 mg/dL)   No results found.  Impression and Recommendations:    Assessment and Plan: 80 y.o. male with ***.  PDMP not reviewed this encounter. No orders of the defined types were placed in this encounter.  No orders of the defined types were placed in this encounter.   Discussed warning signs or symptoms. Please see discharge instructions. Patient expresses understanding.   ***

## 2021-03-08 ENCOUNTER — Ambulatory Visit: Payer: Self-pay

## 2021-03-08 ENCOUNTER — Other Ambulatory Visit: Payer: Self-pay

## 2021-03-08 ENCOUNTER — Other Ambulatory Visit: Payer: Self-pay | Admitting: *Deleted

## 2021-03-08 ENCOUNTER — Ambulatory Visit: Payer: Medicare Other | Admitting: Family Medicine

## 2021-03-08 ENCOUNTER — Encounter: Payer: Self-pay | Admitting: Family Medicine

## 2021-03-08 ENCOUNTER — Ambulatory Visit (INDEPENDENT_AMBULATORY_CARE_PROVIDER_SITE_OTHER): Payer: Medicare Other

## 2021-03-08 VITALS — BP 120/72 | HR 108 | Ht 70.0 in | Wt 270.0 lb

## 2021-03-08 DIAGNOSIS — G8929 Other chronic pain: Secondary | ICD-10-CM | POA: Diagnosis not present

## 2021-03-08 DIAGNOSIS — M5412 Radiculopathy, cervical region: Secondary | ICD-10-CM | POA: Diagnosis not present

## 2021-03-08 DIAGNOSIS — M25511 Pain in right shoulder: Secondary | ICD-10-CM

## 2021-03-08 DIAGNOSIS — M25512 Pain in left shoulder: Secondary | ICD-10-CM

## 2021-03-08 IMAGING — DX DG SHOULDER 2+V*L*
3 series · 3 of 3 positions shown · non-contrast
Comparison: None.

CLINICAL DATA: Left shoulder pain. Chronic bilateral shoulder and
arm pain. Progressive. Numbness on the left going into hand.

EXAM:
LEFT SHOULDER - 2+ VIEW

[shoulder ap (1 of 2)]
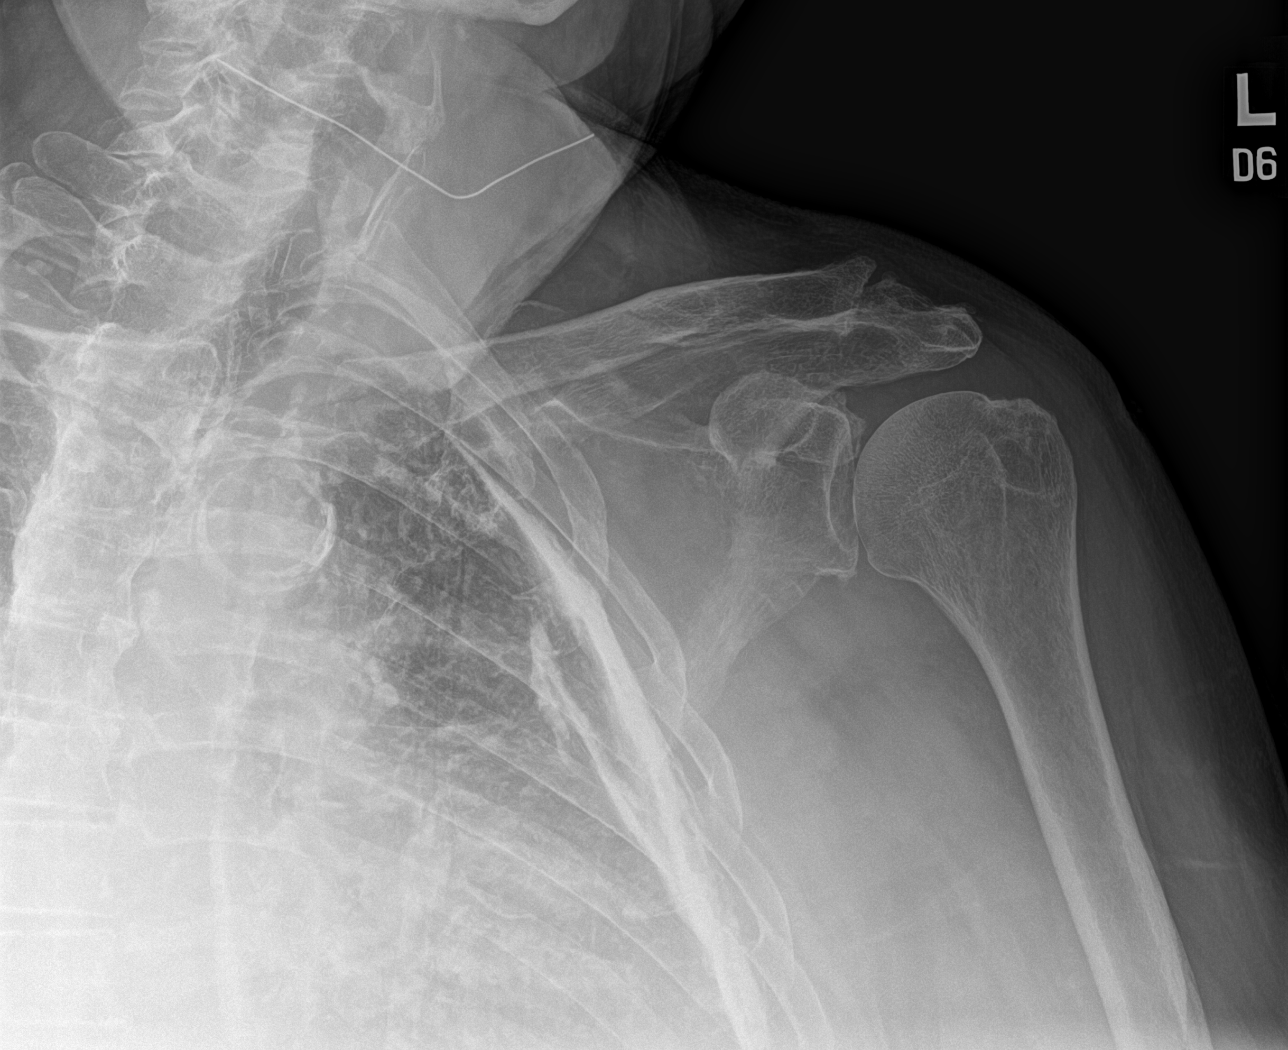

[shoulder ap (2 of 2)]
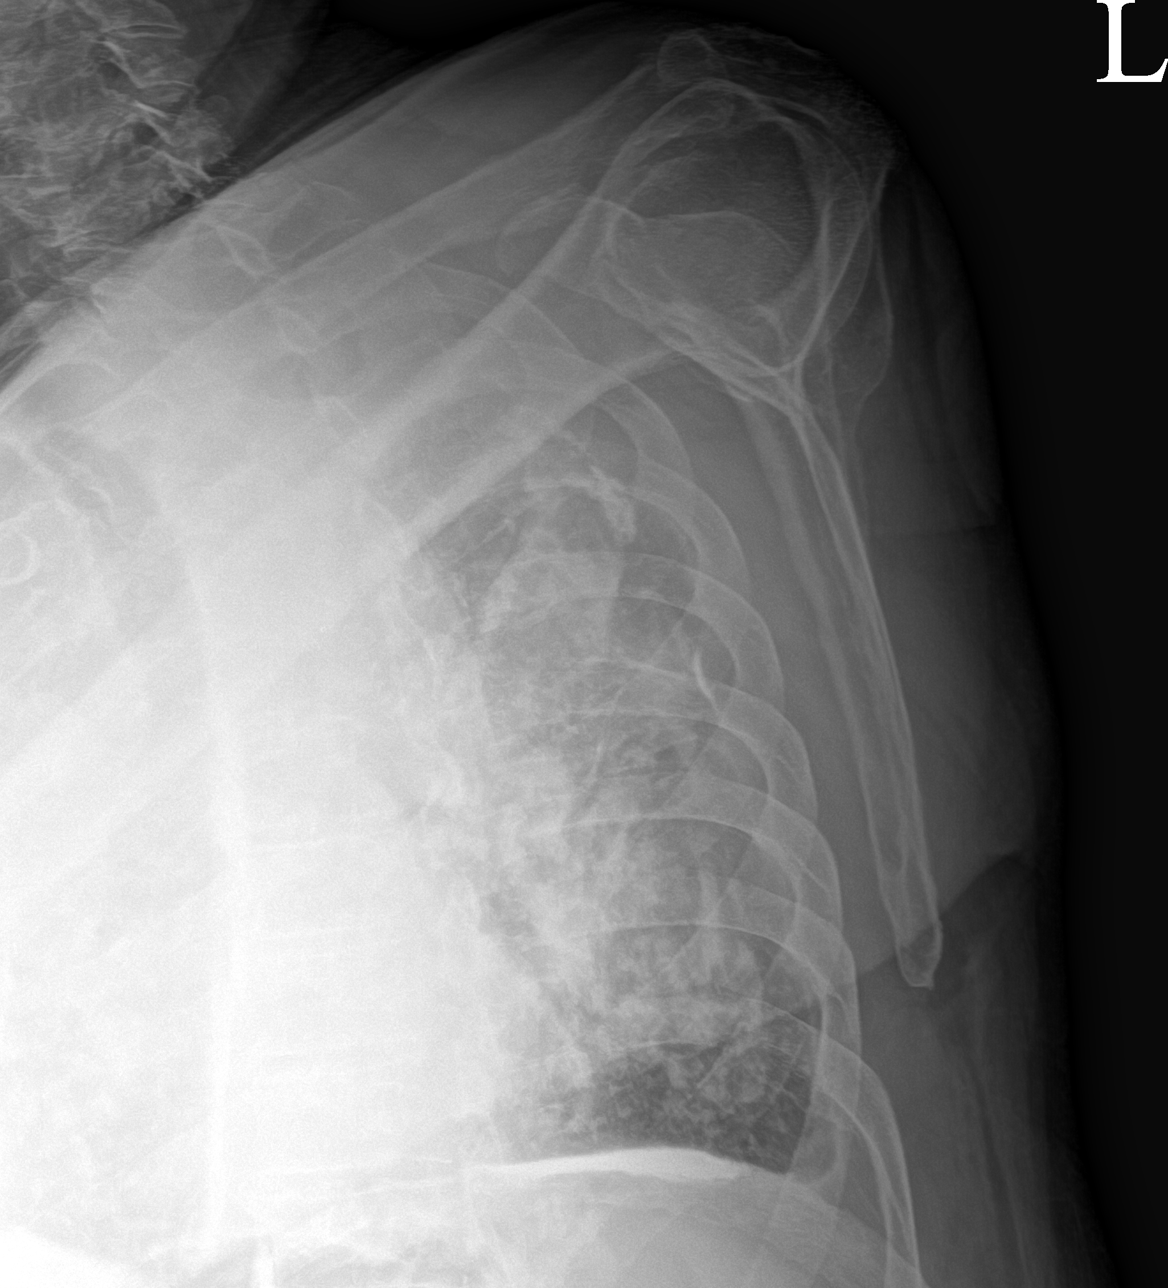

[shoulder axial]
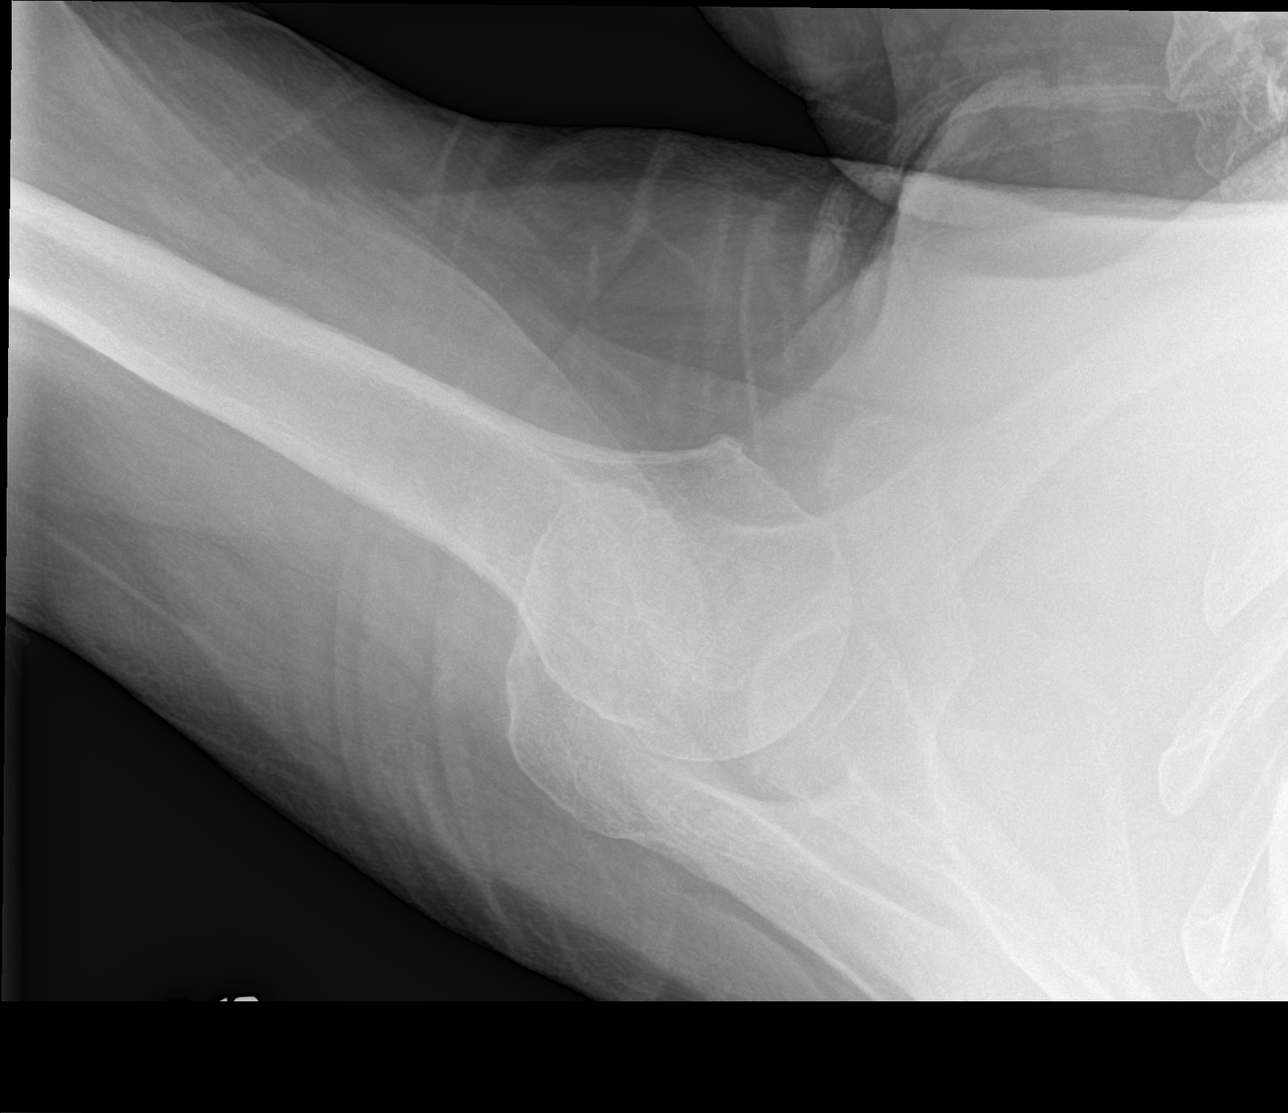

[3 of 3 positions shown; findings below may reference images not displayed]

FINDINGS: Moderate acromioclavicular osteoarthritis with inferior spurring.
Mild glenohumeral osteoarthritis with glenoid spurring, preservation
of joint space. There is subcortical cystic change in the lateral
humeral head. No evidence of fracture, avascular necrosis, or focal
bone lesion. Calcified pleural plaques in the left hemithorax is
seen on prior chest CT.
IMPRESSION: 1. Moderate acromioclavicular and mild glenohumeral osteoarthritis.
2. Subcortical cystic change in the lateral humeral head suggesting
underlying rotator cuff pathology.

## 2021-03-08 IMAGING — DX DG SHOULDER 2+V*R*
3 series · 3 of 3 positions shown · non-contrast
Comparison: None.

CLINICAL DATA: Right shoulder pain. Chronic bilateral shoulder and
arm pain. Progressive.

EXAM:
RIGHT SHOULDER - 2+ VIEW

[shoulder ap (1 of 2)]
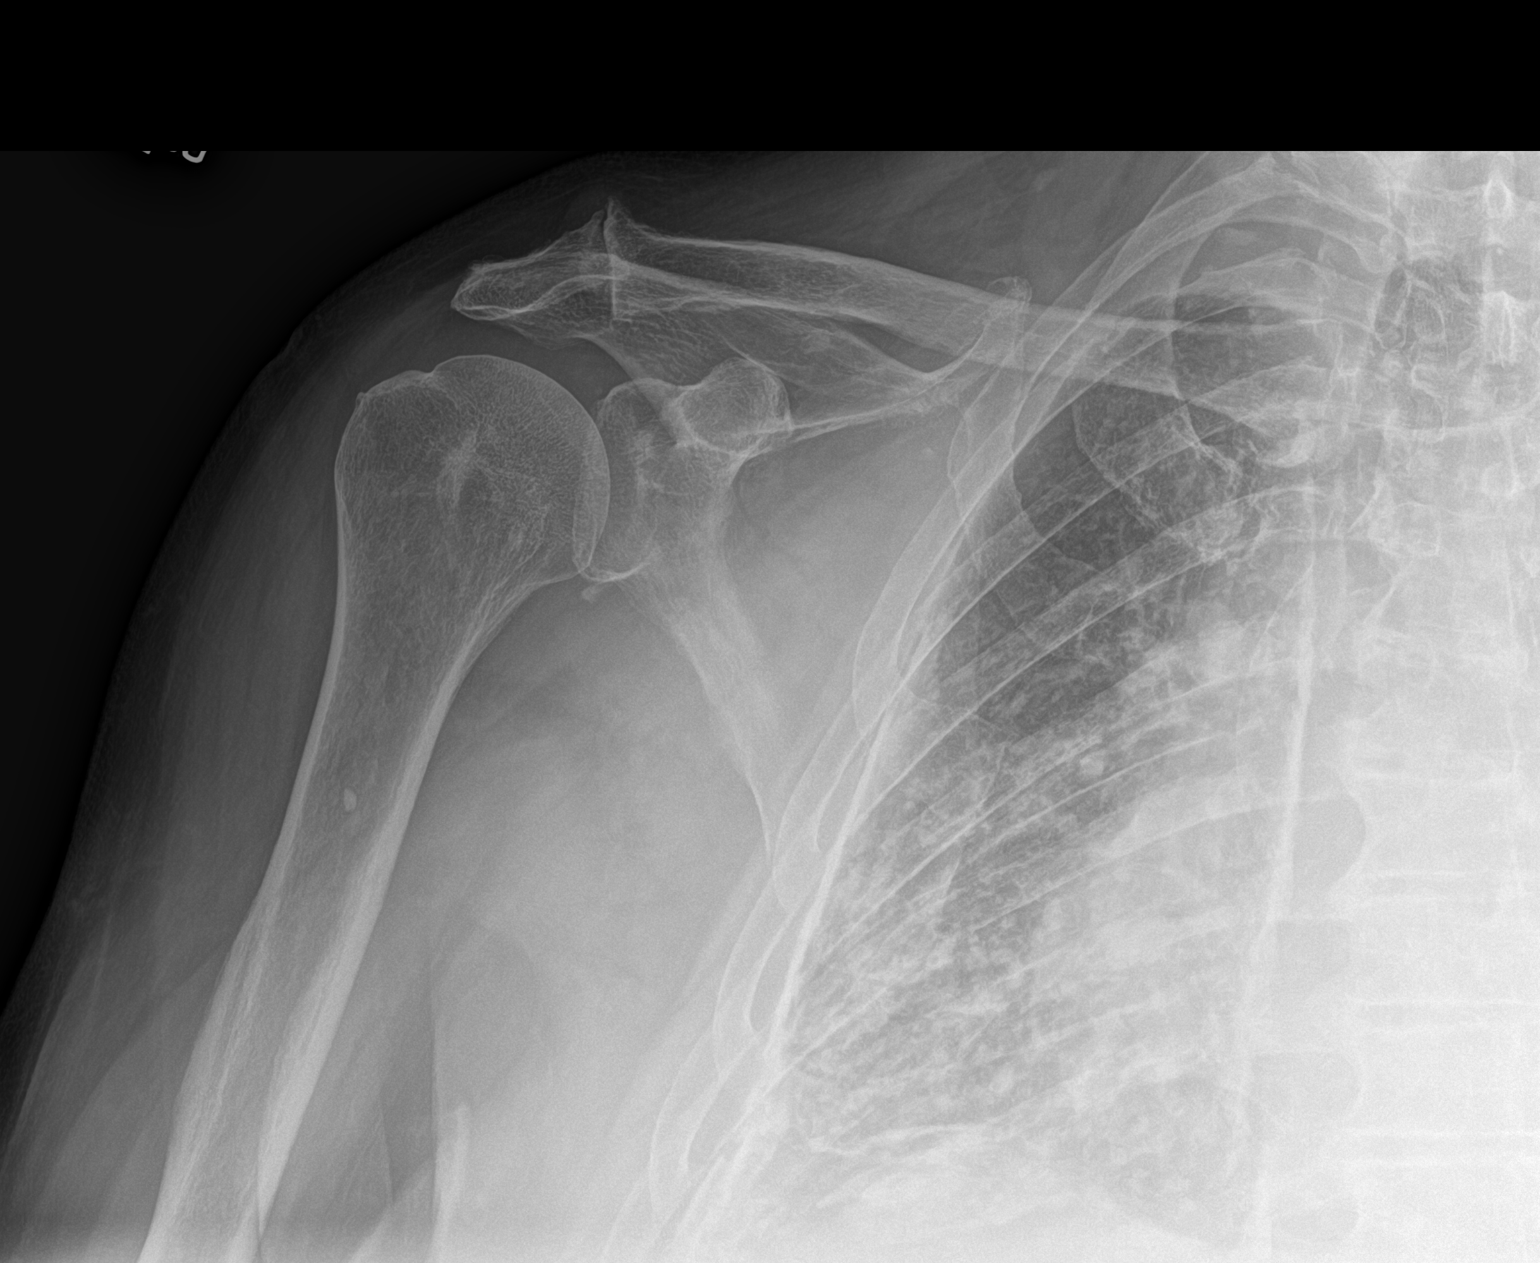

[shoulder ap (2 of 2)]
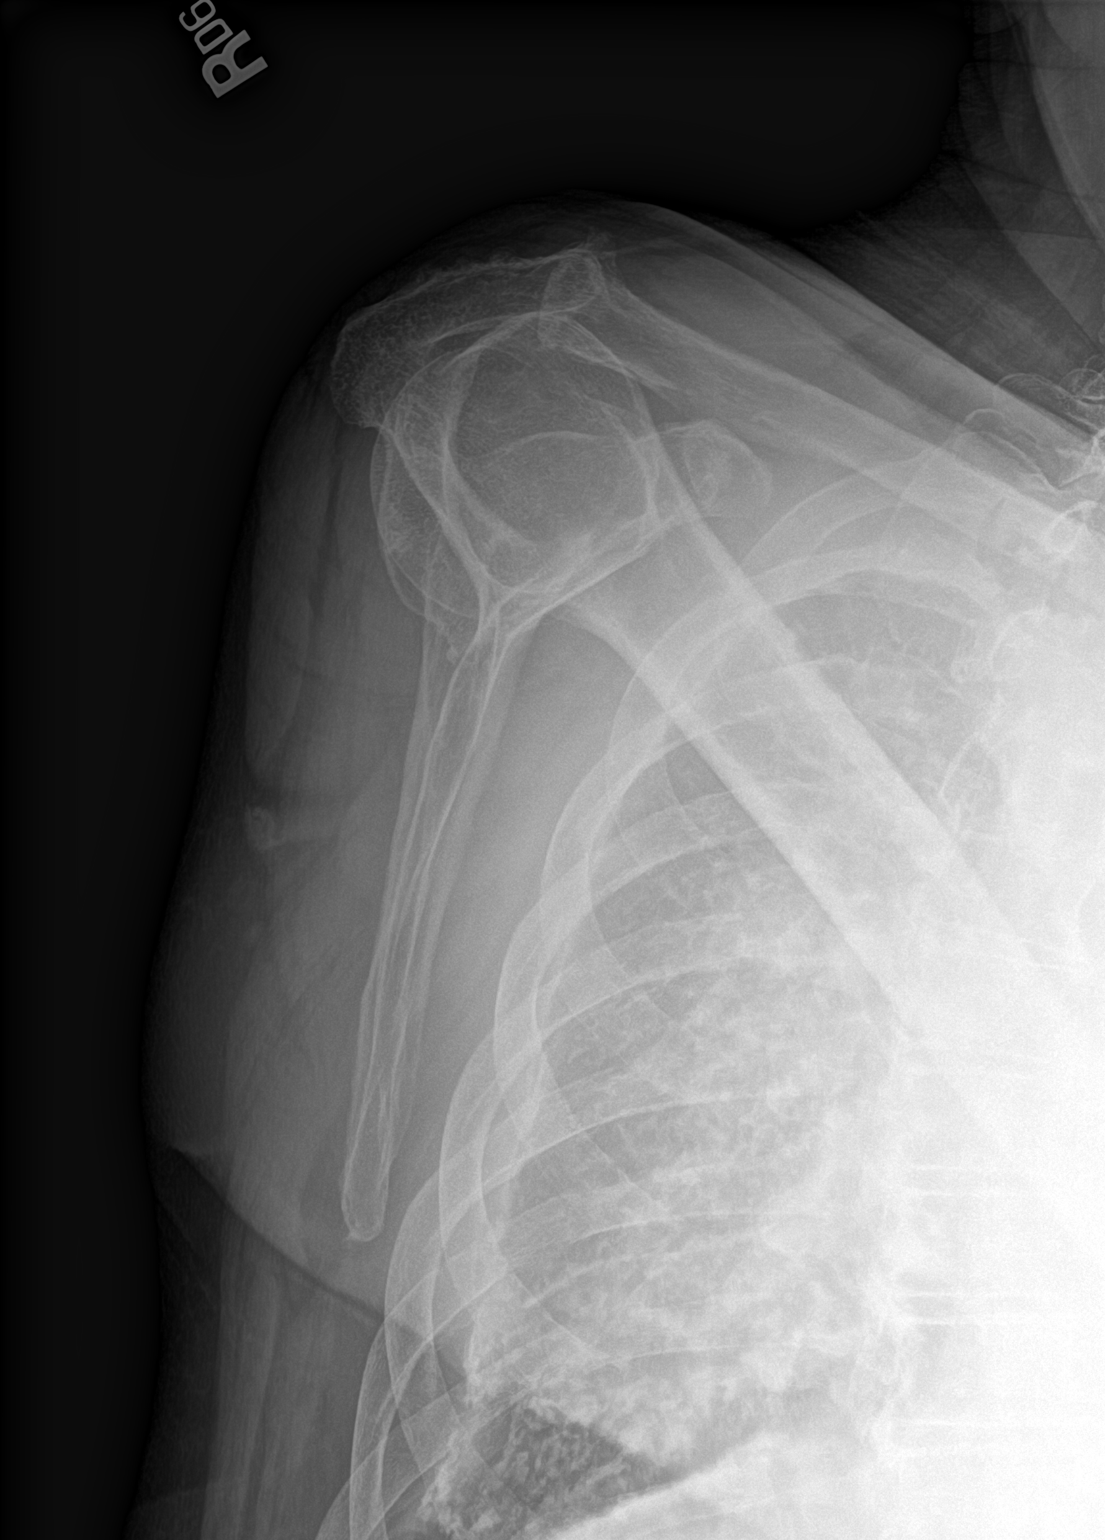

[shoulder axial]
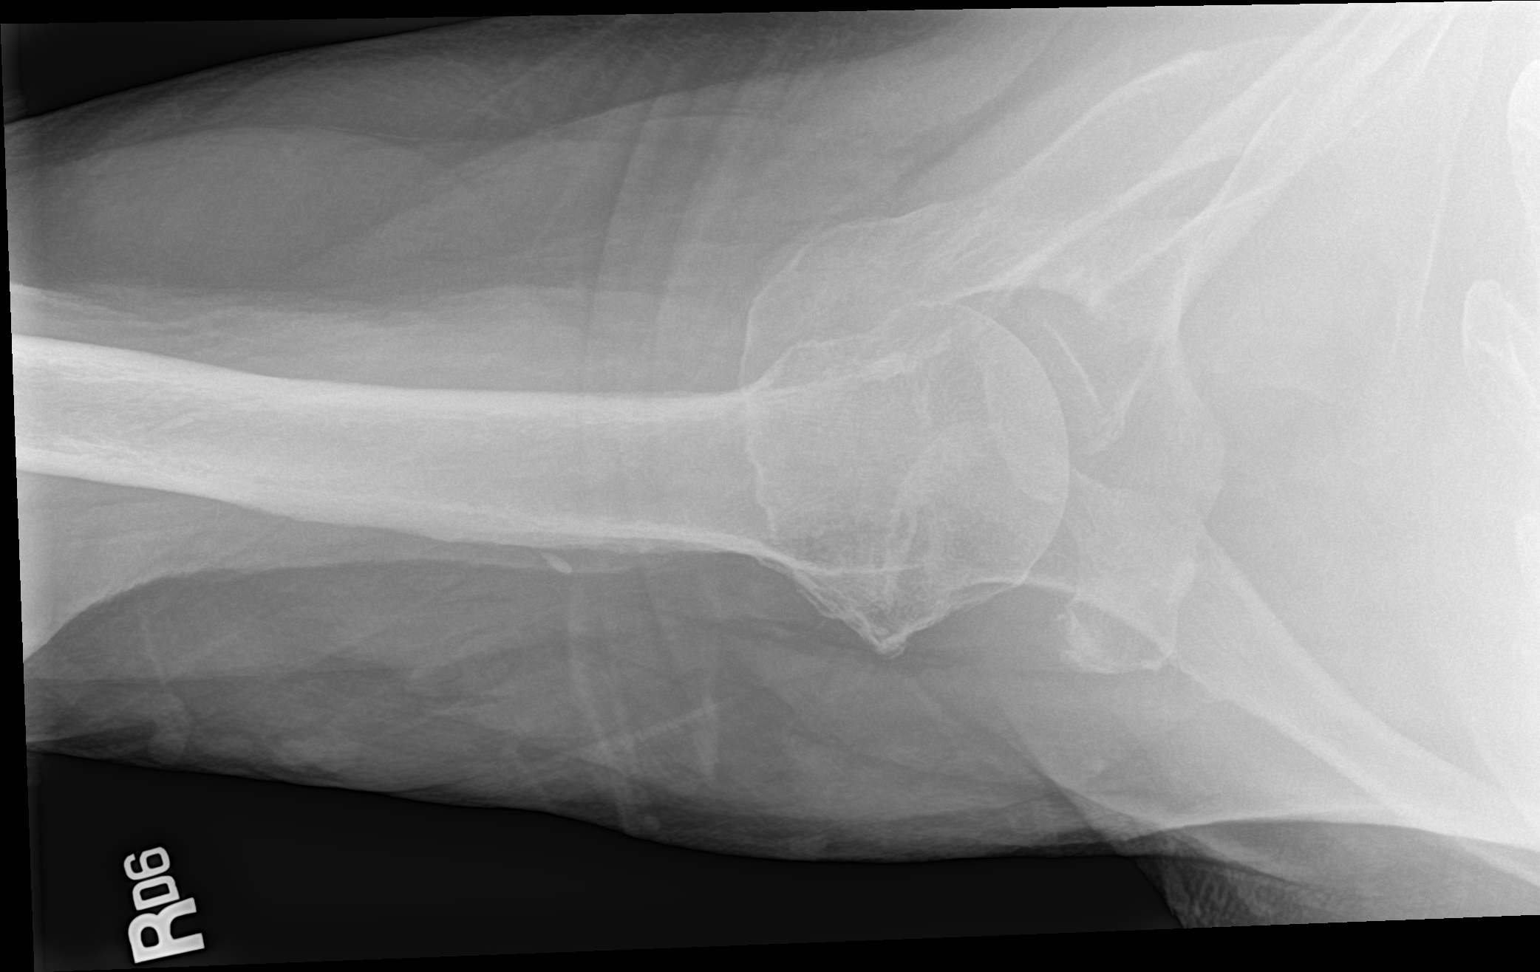

[3 of 3 positions shown; findings below may reference images not displayed]

FINDINGS: Moderate acromioclavicular osteoarthritis with inferiorly directed
spurs. Glenohumeral joint spaces preserved with minimal glenoid
spurring. No evidence of fracture, avascular necrosis, or focal bone
lesion. No soft tissue calcifications. Calcified pleural plaques in
the right hemithorax, previously characterized on chest CT
[DATE].
IMPRESSION: Moderate acromioclavicular osteoarthritis. Minimal glenohumeral
osteoarthritis.

## 2021-03-08 MED ORDER — TRIAMCINOLONE ACETONIDE 0.5 % EX OINT: TOPICAL_OINTMENT | CUTANEOUS | 2 refills | Status: DC

## 2021-03-08 NOTE — Progress Notes (Signed)
I, Maurice Reed, LAT, ATC, am serving as scribe for Dr. [name].  Subjective:    I'm seeing this patient as a consultation for Maurice Reed, Maurice Reed. Note will be routed back to referring provider/PCP.  CC: Bilateral shoulder and arm pain  HPI: Pt is a 80 y/o male c/o chronic bilat shoulder and arm pain, L>R, worsening over the past 3 months. Pt c/o numbness on the L that goes into his L hand and has difficulty making a fist.  Pt is seeing pain mgmt for chronic pain so hasn't taken any dedicated pain medication for this outside of his normal regimen. 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  Neck pain: yes Radiates: yes from the L shoulder to L hand and from R shoulder to R upper arm UE Numbness/tingling: yes- into L hand Weakness: No Aggravates: pain at night; making a fist Treatments tried: exercises w/ hand weights; heat; changing position  Dx imaging: 07/25/20 C-spine MRI  Past medical history, Surgical history, Family history, Social history, Allergies, and medications have been entered into the medical record, reviewed.  Patient is chronically ill.  [name] on Eliquis.  Has received epidural steroid injections in the past.  Review of Systems: No new headache, visual changes, nausea, vomiting, diarrhea, constipation, dizziness, abdominal pain, skin rash, fevers, chills, night sweats, weight loss, swollen lymph nodes, body aches, joint swelling, muscle aches, chest pain, shortness of breath, mood changes, visual or auditory hallucinations.   Objective:    Vitals:   03/08/21 1453  BP: 120/72  Pulse: (!) 108  SpO2: 97%   General: Ill-appearing seated in wheelchair with oxygen nasal cannula. Neuro/Psych: Alert and oriented x3, extra-ocular muscles intact, able to move all 4 extremities, sensation grossly intact. Skin: Warm and dry, no rashes noted.  Respiratory: Not using accessory muscles, speaking in full sentences, trachea  midline.  Cardiovascular: Pulses palpable, no extremity edema. Abdomen: Does not appear distended. MSK: C-spine normal-appearing nontender decreased cervical motion.  Positive Spurling's test left arm. Upper extremity strength is intact. Reflexes are intact. Sensation is intact. Left shoulder normal motion Normal motion. Negative impingement testing.  Right shoulder normal-appearing Nontender. Decreased motion to abduction otherwise range of motion is intact. Mildly positive Hawkins and Neer's test. Intact strength.  Lab and Radiology Results  X-ray images bilateral shoulders obtained today personally and independently interpreted  Right shoulder: Severe AC DJD.  Mild glenohumeral DJD.  No acute fractures.  Left shoulder: Moderate AC DJD.  Mild glenohumeral DJD.  No acute fractures.  Await formal radiology review  EXAM: MRI CERVICAL SPINE WITHOUT CONTRAST  TECHNIQUE: Multiplanar, multisequence MR imaging of the cervical spine was performed. No intravenous contrast was administered.  COMPARISON:  None.  FINDINGS: Alignment: Mild straightening of the normal cervical lordosis.  Vertebrae: No fracture or primary bone lesion.  Cord: No primary cord lesion.  See below regarding stenosis.  Posterior Fossa, vertebral arteries, paraspinal tissues: Negative  Disc levels:  Foramen magnum is widely patent.  C1-2 is negative.  C2-3: Minimal disc bulge. Mild facet osteoarthritis. No compressive stenosis.  C3-4: Endplate osteophytes and bulging of the disc. Canal stenosis with AP diameter in the midline of 9.2 mm. No cord compression. Bilateral facet and uncovertebral osteophytes. Bilateral foraminal narrowing could affect either C4 nerve.  C4-5: Endplate osteophytes and mild bulging of the disc. Facet osteoarthritis on the right. No compressive canal stenosis. Mild foraminal narrowing right more than left but no definite neural compression.  C5-6:  Spondylosis with endplate osteophytes and bulging of the disc. Narrowing of the canal. Effacement of the subarachnoid space. AP diameter in the midline 8 mm. Slight indentation of the cord. No abnormal cord signal. Mild bilateral foraminal stenosis.  C6-7: Endplate osteophytes and disc protrusion more prominent to the left of midline. Effacement of the subarachnoid space. AP diameter of the canal in the midline 7.6 mm. Indentation of the ventral cord. No abnormal cord signal. No foraminal stenosis.  C7-T1: Normal interspace.  IMPRESSION: No primary cord pathology evident in the cervical region.  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 Effacement of the subarachnoid space with slight indentation of cord, but no abnormal cord signal. Potential for neural foraminal compression at multiple levels as outlined above.   Electronically Signed   By: Maurice Reed M.D.   On: 07/25/2020 15:00  I, [name], personally (independently) visualized and performed the interpretation of the images attached in this note.   Impression and Recommendations:    Assessment and Plan: 80 y.o. male with left shoulder and arm pain thought to be more cervical radiculopathy probably C7 or C8.  Patient has neuroforaminal stenosis throughout his cervical spine on C-spine MRI August 2021.  Plan for trial epidural steroid injection.  We will hold his Eliquis for 24 hours prior to 48 hours following his epidural steroid injection.  Recheck following epidural steroid injection in about a month from now.  If not improved would consider trial of shoulder injection..  Higher medical complexity.  Moderate discussion regarding treatment plan and options.  CC: Merryl Hacker NP at [facility] pain.   PDMP not reviewed this encounter. Orders Placed This Encounter  Procedures  . DG Shoulder Right     Standing Status:   Future    Number of Occurrences:   1    Standing Expiration Date:   04/07/2021    Order Specific Question:   Reason for Exam (SYMPTOM  OR DIAGNOSIS REQUIRED)    Answer:   R shoulder pain    Order Specific Question:   Preferred imaging location?    Answer:   [facility]  . DG Shoulder Left    Standing Status:   Future    Number of Occurrences:   1    Standing Expiration Date:   04/07/2021    Order Specific Question:   Reason for Exam (SYMPTOM  OR DIAGNOSIS REQUIRED)    Answer:   L shoulder pain    Order Specific Question:   Preferred imaging location?    Answer:   [facility]  . DG INJECT DIAG/THERA/INC NEEDLE/CATH/PLC EPI/LUMB/SAC W/IMG    CERV EPI #1 [facility] PACS (07/25/20) 270 LBS *ELIQUIS*    Standing Status:   Future    Standing Expiration Date:   03/08/2022    Order Specific Question:   Reason for Exam (SYMPTOM  OR DIAGNOSIS REQUIRED)    Answer:   left arm radiculopathy likely C7 or C8. Level and technique per radiology    Order Specific Question:   Preferred Imaging Location?    Answer:   [id number]    Order Specific Question:   Radiology Contrast Protocol - do NOT remove file path    Answer:   [email or web address]   No orders of the defined types were placed in this encounter.   Discussed warning signs or symptoms. Please see discharge instructions. Patient  expresses understanding.   The above documentation has been reviewed and is accurate and complete Lynne Leader, M.D.

## 2021-03-08 NOTE — Patient Instructions (Signed)
Thank you for coming in today.  Please call [facility] at [phone] to schedule your spine injection.   STOP eliquis 24 hours prior to the injection and restart it 48 hours after the injection.   Return in about 1 month or following your injection.   Please get an Xray today before you leave  Keep me updated.    Epidural Steroid Injection  An epidural steroid injection is a shot of steroid medicine and numbing medicine that is given into the space between the spinal cord and the bones of the back (epidural space). The shot helps relieve pain caused by an irritated or swollen nerve root. The amount of pain relief you get from the injection depends on what is causing the nerve to be swollen and irritated, and how long your pain lasts. You are more likely to benefit from this injection if your pain is strong and comes on suddenly rather than if you have had long-term (chronic) pain. Tell a health care provider about:  Any allergies you have.  All medicines you are taking, including vitamins, herbs, eye drops, creams, and over-the-counter medicines.  Any problems you or family members have had with anesthetic medicines.  Any blood disorders you have.  Any surgeries you have had.  Any medical conditions you have.  Whether you are pregnant or may be pregnant. What are the risks? Generally, this is a safe procedure. However, problems may occur, including:  Headache.  Bleeding.  Infection.  Allergic reaction to medicines.  Nerve damage. What happens before the procedure? Staying hydrated Follow instructions from your health care provider about hydration, which may include:  Up to 2 hours before the procedure - you may continue to drink clear liquids, such as water, clear fruit juice, black coffee, and plain tea. Eating and drinking restrictions Follow instructions from your health care provider about eating and drinking, which may include:  8 hours before the  procedure - stop eating heavy meals or foods, such as meat, fried foods, or fatty foods.  6 hours before the procedure - stop eating light meals or foods, such as toast or cereal.  6 hours before the procedure - stop drinking milk or drinks that contain milk.  2 hours before the procedure - stop drinking clear liquids. Medicines  You may be given medicines to lower anxiety.  Ask your health care provider about: ? Changing or stopping your regular medicines. This is especially important if you are taking diabetes medicines or blood thinners. ? Taking medicines such as aspirin and ibuprofen. These medicines can thin your blood. Do not take these medicines unless your health care provider tells you to take them. ? Taking over-the-counter medicines, vitamins, herbs, and supplements. General instructions  Ask your health care provider what steps will be taken to prevent infection.  Plan to have a responsible adult take you home from the hospital or clinic.  If you will be going home right after the procedure, plan to have a responsible adult care for you for the time you are told. This is important. What happens during the procedure?  An IV will be inserted into one of your veins.  You will be given one or more of the following: ? A medicine to help you relax (sedative). ? A medicine to numb the area (local anesthetic).  You will be asked to lie on your abdomen or sit.  The injection site will be cleaned.  A needle will be inserted through your skin into the epidural  space. This may cause you some discomfort. An X-ray machine will be used to guide the needle as close as possible to the affected nerve.  A steroid medicine and a local anesthetic will be injected into the epidural space.  The needle and IV will be removed.  A bandage (dressing) will be put over the injection site. The procedure may vary among health care providers and hospitals. What can I expect after the  procedure?  Your blood pressure, heart rate, breathing rate, and blood oxygen level will be monitored until you leave the hospital or clinic.  Your arm or leg may feel weak or numb for a few hours.  The injection site may feel sore. Follow these instructions at home: Injection site care  You may remove the bandage (dressing) after 24 hours.  Check your injection site every day for signs of infection. Check for: ? Redness, swelling, or pain. ? Fluid or blood. ? Warmth. ? Pus or a bad smell. Managing pain, stiffness, and swelling  For 24 hours after the procedure: ? Avoid using heat on the injection site. ? Do not take baths, swim, or use a hot tub until your health care provider approves. Ask your health care provider if you may take showers. You may only be allowed to take sponge baths.   If directed, put ice on the injection site. To do this: ? Put ice in a plastic bag. ? Place a towel between your skin and the bag. ? Leave the ice on for 20 minutes, 2-3 times a day.   Activity  If you were given a sedative during the procedure, it can affect you for several hours. Do not drive or operate machinery until your health care provider says that it is safe.  Return to your normal activities as told by your health care provider. Ask your health care provider what activities are safe for you. General instructions  Take over-the-counter and prescription medicines only as told by your health care provider.  Drink enough fluid to keep your urine pale yellow.  Keep all follow-up visits as told by your health care provider. This is important. Contact a health care provider if:  You have any of these signs of infection: ? Redness, swelling, or pain around your injection site. ? Fluid or blood coming from your injection site. ? Warmth coming from your injection site. ? Pus or a bad smell coming from your injection site. ? A fever.  You continue to have pain and soreness around the  injection site, even after taking over-the-counter pain medicine.  You have severe, sudden, or lasting nausea or vomiting. Get help right away if:  You have severe pain at the injection site that is not relieved by medicines.  You develop a severe headache or a stiff neck.  You become sensitive to light.  You have any new numbness or weakness in your legs or arms.  You lose control of your bladder or bowel movements.  You have trouble breathing. Summary  An epidural steroid injection is a shot of steroid medicine and numbing medicine that is given into the epidural space.  The shot helps relieve pain caused by an irritated or swollen nerve root.  You are more likely to benefit from this injection if your pain is strong and comes on suddenly rather than if you have had chronic pain. This information is not intended to replace advice given to you by your health care provider. Make sure you discuss any questions you  have with your health care provider. Document Revised: 03/20/2020 Document Reviewed: 06/03/2019 Elsevier Patient Education  2021 [employer].

## 2021-03-09 ENCOUNTER — Other Ambulatory Visit: Payer: Self-pay | Admitting: Physician Assistant

## 2021-03-09 ENCOUNTER — Telehealth: Payer: Self-pay | Admitting: Family Medicine

## 2021-03-09 NOTE — Telephone Encounter (Signed)
error 

## 2021-03-10 NOTE — Progress Notes (Signed)
Right shoulder x-ray shows some arthritis in the shoulder.

## 2021-03-10 NOTE — Progress Notes (Signed)
Left shoulder x-ray shows a little bit of arthritis in the shoulder and some concern for rotator cuff disease.

## 2021-03-15 ENCOUNTER — Inpatient Hospital Stay
Admission: RE | Admit: 2021-03-15 | Discharge: 2021-03-15 | Disposition: A | Discharge status: Home / Self Care | Payer: Medicare Other | Source: Ambulatory Visit | Attending: Family Medicine | Admitting: Family Medicine

## 2021-03-15 NOTE — Discharge Instructions (Signed)
Post Procedure Spinal Discharge Instruction Sheet  1. You may resume a regular diet and any medications that you routinely take (including pain medications).  2. No driving day of procedure.  3. Light activity throughout the rest of the day.  Do not do any strenuous work, exercise, bending or lifting.  The day following the procedure, you can resume normal physical activity but you should refrain from exercising or physical therapy for at least three days thereafter.   Common Side Effects:   Headaches- take your usual medications as directed by your physician.  Increase your fluid intake.  Caffeinated beverages may be helpful.  Lie flat in bed until your headache resolves.   Restlessness or inability to sleep- you may have trouble sleeping for the next few days.  Ask your referring physician if you need any medication for sleep.   Facial flushing or redness- should subside within a few days.   Increased pain- a temporary increase in pain a day or two following your procedure is not unusual.  Take your pain medication as prescribed by your referring physician.   Leg cramps  Please contact our office at [phone] for the following symptoms:  Fever greater than 100 degrees.  Headaches unresolved with medication after 2-3 days.  Increased swelling, pain, or redness at injection site.   YOU MAY RESUME YOUR ELIQUIS IN 24 HOURS WHICH WILL BE TOMORROW ON 03/16/21 @ 1:30PM.

## 2021-03-17 ENCOUNTER — Telehealth (INDEPENDENT_AMBULATORY_CARE_PROVIDER_SITE_OTHER): Payer: Medicare Other | Admitting: Physician Assistant

## 2021-03-17 ENCOUNTER — Other Ambulatory Visit: Payer: Self-pay | Admitting: Physician Assistant

## 2021-03-17 ENCOUNTER — Encounter: Payer: Self-pay | Admitting: Physician Assistant

## 2021-03-17 VITALS — HR 90 | Ht 70.0 in | Wt 266.0 lb

## 2021-03-17 DIAGNOSIS — M79644 Pain in right finger(s): Secondary | ICD-10-CM

## 2021-03-17 MED ORDER — CEPHALEXIN 500 MG PO CAPS: 500.0000 mg | ORAL_CAPSULE | Freq: Three times a day (TID) | ORAL | 0 refills | Status: AC

## 2021-03-17 NOTE — Progress Notes (Signed)
Virtual Visit via Video   I connected with Maurice Reed on 03/17/21 at 11:00 AM EDT by a video enabled telemedicine application and verified that I am speaking with the correct person using two identifiers. Location patient: Home Location provider: [facility], Office Persons participating in the virtual visit: Shamell, Suarez PA-C, Anselmo Pickler, LPN   I discussed the limitations of evaluation and management by telemedicine and the availability of in person appointments. The patient expressed understanding and agreed to proceed.  I acted as a Education administrator for [employer], PA-C [employer], LPN   Subjective:   HPI:   Finger problem Pt c/o swelling and redness right index finger at middle of finger joint area, started 4 days ago. No inciting event; no trauma or bug bite. Area is painful and has some difficulty bending finger. Pt is having pain, tylenol is not helping. Has been icing and elevating finger. Denies personal prior hx of gout. No fever, chills, but reports that he overall doesn't feel "well." Have laser surgery on his eye tomorrow at [facility].  ROS: See pertinent positives and negatives per HPI.  Patient Active Problem List   Diagnosis Date Noted  . Hyperlipidemia 01/27/2021  . Mild aortic stenosis 01/27/2021  . Chronic anticoagulation 04/16/2020  . Chronic constipation 03/03/2020  . Bilateral lower abdominal discomfort 03/03/2020  . Chronic respiratory failure with hypoxia ([place]) 12/23/2019  . Shortness of breath 12/23/2019  . Healthcare maintenance 08/16/2019  . 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 Bilateral lower extremity edema 07/04/2018  . Sclerosis of the skin 06/26/2018  . ILD (interstitial lung disease) ([place]) 05/22/2018  . Insomnia 05/22/2018  . Erectile dysfunction 05/22/2018  . Obesity   . Kidney stones   .  Essential hypertension   . GERD (gastroesophageal reflux disease)   . Former smoker   . Arthritis   . 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 OSA treated with BiPAP 06/17/2013  . 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 Atrial fibrillation ([place]) 06/17/2013  . Rosacea 06/17/2013  . 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 History of pulmonary embolus (PE) 12/05/1998    Social History   Tobacco Use  . Smoking status: Former Smoker    Packs/day: 3.00    Years: 25.00    Pack years: 75.00    Types: Cigarettes    Quit date: 11/28/1984    Years since quitting: 36.3  . Smokeless tobacco: Never Used  Substance Use Topics  . Alcohol use: Never    Current Outpatient Medications:  .  Apremilast (OTEZLA PO), Take 30 mg by mouth 2 (two) times daily., Disp: , Rfl:  .  Ascorbic Acid (VITAMIN C) 500 MG CAPS, Take 1 capsule by mouth daily., Disp: , Rfl:  .  Atropine Sulfate-NaCl 0.01-0.9 % SOLN, Apply 1 drop to eye at bedtime. , Disp: , Rfl:  .  cephALEXin (KEFLEX) 500 MG capsule, Take 1 capsule (500 mg total) by mouth 3 (three) times daily for 7 days., Disp: 21 capsule, Rfl: 0 .  Cholecalciferol (VITAMIN D3) 3000 units TABS, Take by mouth., Disp: , Rfl:  .  clobetasol (TEMOVATE) 0.05 % external solution, APPLY TO AFFECTED AREA(S)  ON SCALP TOPICALLY ONE TO  TWO TIMES A DAY AS NEEDED, Disp: 150 mL, Rfl: 4 .  desonide (DESOWEN) 0.05 % lotion,  Apply topically., Disp: , Rfl:  .  docusate sodium (COLACE) 100 MG capsule, Take 100 mg by mouth daily. , Disp: , Rfl:  .  doxycycline (VIBRA-TABS) 100 MG tablet, TAKE 1 TABLET BY MOUTH  TWICE DAILY, Disp: 180 tablet, Rfl: 0 .  ELIQUIS 5 MG TABS tablet, TAKE 1 TABLET(5 MG) BY MOUTH TWICE DAILY, Disp: 60 tablet, Rfl: 2 .  erythromycin ophthalmic ointment, APP THIN LAYER IN OS BID, Disp: , Rfl: 1 .  ipratropium-albuterol (DUONEB) 0.5-2.5 (3) MG/3ML SOLN, Take 3 mLs by  nebulization every 6 (six) hours as needed., Disp: 360 mL, Rfl: 3 .  magnesium oxide (MAG-OX) 400 MG tablet, TAKE 1 TABLET BY MOUTH TWICE A DAY, Disp: 180 tablet, Rfl: 2 .  metoprolol succinate (TOPROL-XL) 25 MG 24 hr tablet, TAKE 1 TABLET BY MOUTH  DAILY, Disp: 90 tablet, Rfl: 3 .  mometasone (NASONEX) 50 MCG/ACT nasal spray, Place 2 sprays into the nose daily. (Patient taking differently: Place 2 sprays into the nose as needed.), Disp: 51 g, Rfl: 2 .  NARCAN 4 MG/0.1ML LIQD nasal spray kit, INSTILL ONE SPRAY PRN FOR ACCIDENTAL OVERDOSE, Disp: , Rfl: 0 .  nystatin ointment (MYCOSTATIN), Apply 1 application topically 2 (two) times daily., Disp: 30 g, Rfl: 1 .  oxyCODONE (OXY IR/ROXICODONE) 5 MG immediate release tablet, Take 5 mg by mouth 2 (two) times daily as needed. for pain, Disp: , Rfl: 0 .  potassium chloride (MICRO-K) 10 MEQ CR capsule, TAKE 2 CAPSULES BY MOUTH IN THE AM AND 1 CAPSULE IN THE EVENING, Disp: 90 capsule, Rfl: 6 .  prednisoLONE acetate (PRED FORTE) 1 % ophthalmic suspension, Place 1 drop into the left eye 2 (two) times daily. , Disp: , Rfl:  .  tadalafil (CIALIS) 20 MG tablet, Take 0.5-1 tablets (10-20 mg total) by mouth every other day as needed for erectile dysfunction., Disp: 5 tablet, Rfl: 11 .  Tafluprost, PF, 0.0015 % SOLN, Apply 1 drop to eye daily. Drop 1 drop into right at night, Disp: , Rfl:  .  tamsulosin (FLOMAX) 0.4 MG CAPS capsule, TAKE 1 CAPSULE BY MOUTH AT  BEDTIME, Disp: 90 capsule, Rfl: 3 .  temazepam (RESTORIL) 15 MG capsule, TAKE 1 CAPSULE (15 MG TOTAL) BY MOUTH AT BEDTIME AS NEEDED FOR SLEEP., Disp: 30 capsule, Rfl: 0 .  torsemide (DEMADEX) 20 MG tablet, TAKE 2 TABLETS BY MOUTH IN THE AM AND 1 TAB IN THE EVENING, Disp: 270 tablet, Rfl: 3 .  triamcinolone ointment (KENALOG) 0.5 %, APPLY TO AFFECTED AREA(S)  TOPICALLY 1 TO 2 TIMES  DAILY, Disp: 45 g, Rfl: 2  Allergies  Allergen Reactions  . Other Itching    Cats: watery eyes, itching, sneezing     Objective:   VITALS: Per patient if applicable, see vitals. GENERAL: Alert, appears well and in no acute distress. HEENT: Atraumatic, conjunctiva clear, no obvious abnormalities on inspection of external nose and ears. NECK: Normal movements of the head and neck. CARDIOPULMONARY: No increased WOB. Speaking in clear sentences. I:E ratio WNL.  MS: Moves all visible extremities without noticeable abnormal except R index finger with swelling and decreased ROM at PIP and MCP joint. PSYCH: Pleasant and cooperative, well-groomed. Speech normal rate and rhythm. Affect is appropriate. Insight and judgement are appropriate. Attention is focused, linear, and appropriate.  NEURO: CN grossly intact. Oriented as arrived to appointment on time with no prompting. Moves both UE equally.  SKIN: No obvious lesions, wounds, erythema, or cyanosis noted on face or hands.  Assessment and Plan:   Maurice Reed was seen today for finger problem.  Diagnoses and all orders for this visit:  Finger pain, right  Other orders -     cephALEXin (KEFLEX) 500 MG capsule; Take 1 capsule (500 mg total) by mouth 3 (three) times daily for 7 days.   Unclear etiology. Possible cellulitis vs gout vs other. Will trial oral keflex. We will also consider prednisone however I asked his wife to clear this with his eye doctor that he is seeing tomorrow and to only consider taking if they approve of this. If no improvement or worsening, will need in-office visit and xray. Worsening precautions advised.  I discussed the assessment and treatment plan with the patient. The patient was provided an opportunity to ask questions and all were answered. The patient agreed with the plan and demonstrated an understanding of the instructions.   The patient was advised to call back or seek an in-person evaluation if the symptoms worsen or if the condition fails to improve as anticipated.   CMA or LPN served as scribe during this visit. History,  Physical, and Plan performed by medical provider. The above documentation has been reviewed and is accurate and complete.   [place], [place] 03/17/2021

## 2021-03-18 ENCOUNTER — Telehealth: Payer: Self-pay

## 2021-03-18 NOTE — Telephone Encounter (Signed)
Patient's wife called in stating that the doctor at [facility] gave an okay for a round of prednisone, can not be on it more than 2 weeks. [name] submitted a picture on her mychart of his finger, she states it is worse than what it was yesterday.

## 2021-03-22 ENCOUNTER — Other Ambulatory Visit: Payer: Self-pay

## 2021-03-22 ENCOUNTER — Ambulatory Visit
Admission: RE | Admit: 2021-03-22 | Discharge: 2021-03-22 | Disposition: A | Discharge status: Home / Self Care | Payer: Medicare Other | Source: Ambulatory Visit | Attending: Family Medicine | Admitting: Family Medicine

## 2021-03-22 DIAGNOSIS — M5412 Radiculopathy, cervical region: Secondary | ICD-10-CM

## 2021-03-22 IMAGING — XA Imaging study
2 series · 2 of 2 positions shown · non-contrast
Comparison: none

CLINICAL DATA: Left upper extremity radiculopathy. Displacement of
the lumbar discs at L3-4, L5-6, and L6-7 in particular. Foraminal
narrowing is greatest at C5-6.

[Series 1: ortho adipose · 1 of 1 slices shown (1 of 2)]
[im 1/1]
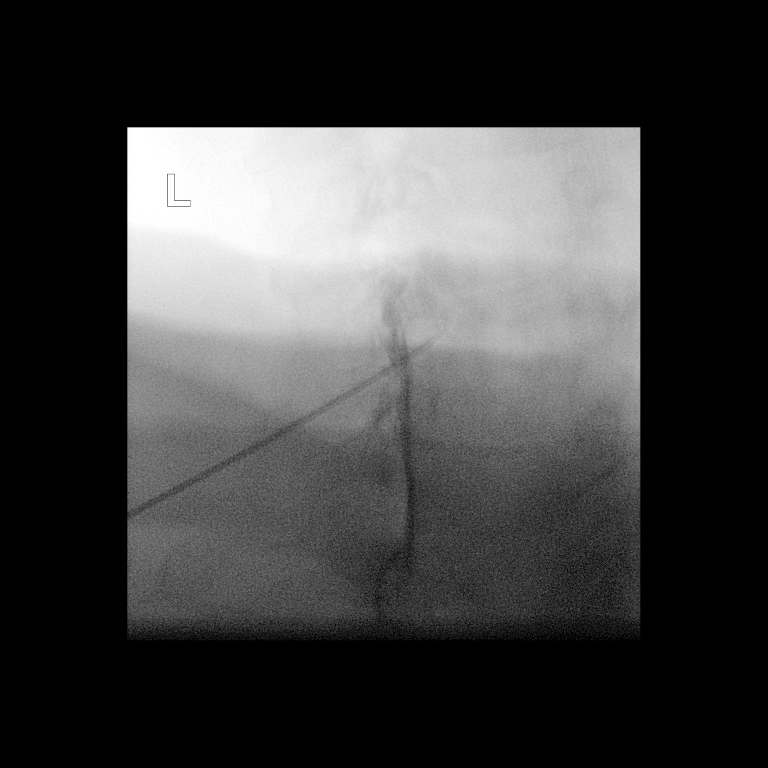

[Series 2: ortho adipose · 1 of 1 slices shown (2 of 2)]
[im 1/1]
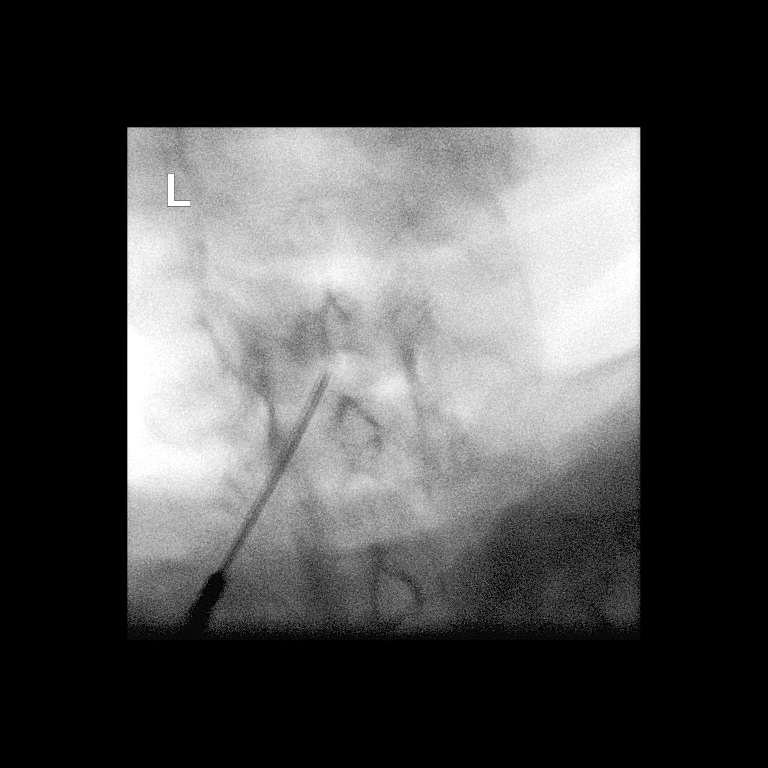

[2 of 2 positions shown; findings below may reference images not displayed]

FLUOROSCOPY TIME:  Radiation Exposure Index (as provided by the
fluoroscopic device): 28.09 uGy*m2

PROCEDURE:
CERVICAL EPIDURAL INJECTION

An interlaminar approach was performed on the left at C6-7. A 20
gauge epidural needle was advanced using loss-of-resistance
technique.

DIAGNOSTIC EPIDURAL INJECTION

Injection of Isovue-M 300 shows a good epidural pattern with spread
above and below the level of needle placement, primarily on the
left. No vascular opacification is seen. THERAPEUTIC

EPIDURAL INJECTION

1.5 ml of Kenalog 40 mixed with 1 ml of 1% Lidocaine and 2 ml of
normal saline were then instilled. The procedure was well-tolerated,
and the patient was discharged thirty minutes following the
injection in good condition.
IMPRESSION: Technically successful first epidural injection on the left at C6-7.

## 2021-03-22 MED ORDER — TRIAMCINOLONE ACETONIDE 40 MG/ML IJ SUSP (RADIOLOGY)
60.0000 mg | Freq: Once | INTRAMUSCULAR | Status: AC
  Administered 2021-03-22: 60 mg via EPIDURAL

## 2021-03-22 MED ORDER — IOPAMIDOL (ISOVUE-M 300) INJECTION 61%
11.0000 mL | Freq: Once | INTRAMUSCULAR | Status: AC | PRN
  Administered 2021-03-22: 11 mL via EPIDURAL

## 2021-03-22 NOTE — Telephone Encounter (Signed)
This message is now 70 days old... please call Brenda/Racer to find out what is going on now before we send medications in.

## 2021-03-22 NOTE — Discharge Instructions (Signed)
Post Procedure Spinal Discharge Instruction Sheet  1. You may resume a regular diet and any medications that you routinely take (including pain medications).  2. No driving day of procedure.  3. Light activity throughout the rest of the day.  Do not do any strenuous work, exercise, bending or lifting.  The day following the procedure, you can resume normal physical activity but you should refrain from exercising or physical therapy for at least three days thereafter.   Common Side Effects:   Headaches- take your usual medications as directed by your physician.  Increase your fluid intake.  Caffeinated beverages may be helpful.  Lie flat in bed until your headache resolves.   Restlessness or inability to sleep- you may have trouble sleeping for the next few days.  Ask your referring physician if you need any medication for sleep.   Facial flushing or redness- should subside within a few days.   Increased pain- a temporary increase in pain a day or two following your procedure is not unusual.  Take your pain medication as prescribed by your referring physician.   Leg cramps  Please contact our office at [phone] for the following symptoms:  Fever greater than 100 degrees.  Headaches unresolved with medication after 2-3 days.  Increased swelling, pain, or redness at injection site.  YOU MAY RESUME YOUR ELIQUIS 24 HOURS AFTER INJECTION (TOMORROW 4/19 @ 1:30 PM)

## 2021-03-22 NOTE — Telephone Encounter (Signed)
Please see message and advise 

## 2021-03-23 ENCOUNTER — Other Ambulatory Visit: Payer: Self-pay | Admitting: Physician Assistant

## 2021-03-23 MED ORDER — PREDNISONE 20 MG PO TABS: 20.0000 mg | ORAL_TABLET | Freq: Every day | ORAL | 0 refills | Status: DC

## 2021-03-23 NOTE — Telephone Encounter (Signed)
Left a voicemail for patient.

## 2021-03-23 NOTE — Telephone Encounter (Signed)
Patients wife states that finger is still swollen, but less red now. Says that a medication was already sent in for his finger, was unable to give name. But states he is doing better.

## 2021-03-23 NOTE — Telephone Encounter (Signed)
Patients wife states redness is still on thumb, from about the middle down but has improved. Is able to bend finger a little bit where as he was not able to before. Was told not to use ice as it was making it worse, was told to keep it elevated and not use heat. Just concerned about swelling at the moment.

## 2021-03-23 NOTE — Telephone Encounter (Signed)
If he is improving, I do not want to start oral steroids at this time. Keep [place] posted.

## 2021-03-23 NOTE — Telephone Encounter (Signed)
I will send in a small dose of prednisone for him to take for a few days to see if this will help. If it does not help, stop prednisone and follow-up with [place] so we can get imaging/blood work if needed.

## 2021-03-23 NOTE — Telephone Encounter (Signed)
Patients wife states that finger is still swollen, but less red now. Says that a medication was already sent in for his finger, Keflex. But states he is doing a little better.

## 2021-03-24 ENCOUNTER — Telehealth: Payer: Self-pay | Admitting: *Deleted

## 2021-03-24 MED ORDER — CLOBETASOL PROPIONATE 0.05 % EX SOLN: 1.0000 "application " | Freq: Two times a day (BID) | CUTANEOUS | 2 refills | Status: DC

## 2021-03-24 NOTE — Telephone Encounter (Signed)
Pt's wife [name] requesting a Rx for pt Clobetasol Solution 0.05%. Okay to send in per [facility]. Rx sent and My Chart message sent.

## 2021-03-31 ENCOUNTER — Other Ambulatory Visit: Payer: Self-pay | Admitting: Physician Assistant

## 2021-04-13 ENCOUNTER — Other Ambulatory Visit: Payer: Self-pay | Admitting: Physician Assistant

## 2021-04-19 ENCOUNTER — Other Ambulatory Visit: Payer: Medicare Other | Admitting: Hospice

## 2021-04-19 ENCOUNTER — Other Ambulatory Visit: Payer: Self-pay

## 2021-04-19 DIAGNOSIS — J849 Interstitial pulmonary disease, unspecified: Secondary | ICD-10-CM

## 2021-04-19 DIAGNOSIS — M792 Neuralgia and neuritis, unspecified: Secondary | ICD-10-CM

## 2021-04-19 DIAGNOSIS — Z515 Encounter for palliative care: Secondary | ICD-10-CM

## 2021-04-19 NOTE — Progress Notes (Addendum)
[employer] Palliative Care Consult Note Telephone: [phone]  Fax: [phone]  PATIENT NAME: Maurice Reed DOB: [birth year] MRN: [id number]  PRIMARY CARE PROVIDER:   Inda Coke, Kiel, [place], [place] [address] [place],  [place] [address]  REFERRING PROVIDER: Inda Coke, Fontanelle Inda Coke, [place] [address] [place],  [place] [address] RESPONSIBLE PARTY:Self [id number] [phone] - spouse - best to call Extended Emergency Contact Information Primary Emergency Contact: Fred, Franzen Mobile Phone: [phone] Relation: Spouse Secondary Emergency Contact: Perrin Maltese Mobile Phone: [phone] Relation: Granddaughter TELEHEALTH VISIT STATEMENT Due to the COVID-19 crisis, this visit was done via telemedicine and it was initiated and consent by this patient and or family. Video-audio (telehealth) contact was unable to be done due to technical barriers from the patient's side.  Visit is to build trust and highlight Palliative Medicine as specialized medical care for people living with serious illness, aimed at facilitating better quality of life through symptoms relief, assisting with advance care plan and establishing goals of care.   RECOMMENDATIONS/PLAN:   CODE STATUS:CODE STATUS reviewed.Patient is a DO NOT RESUSCITATE.  GOALS OF CARE: Goals of care include to maximize quality of life and symptom management. MOST selections include DO NOT RESUSCITATE, limited additional intervention, IV fluids for defined trial., Antibiotics as indicated, no feeding tube. Patient/family open to Hospice service in the future when appropriate.  Visit consisted of counseling and education dealing with the complex and emotionally intense issues of symptom management and palliative care in the setting of serious and potentially life-threatening illness. Palliative care team will continue to support patient, patient's family,  and medical team.  3. 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  On oxycodone, followed at pain clinic. Lyrica 25 mg p.o. BID daily for start recommended.  Lyrica Can be titrated up to 50 mg p.o.TID, if needed.  Routine CBC BMP. [name] said she will contact PCP for prescription and pick up. Left shoulder pain: Patient is followed by Ortho for cortisone shots. Continue oxycodone as ordered for pain, follow-up scheduled appointments at [facility] pain clinic. Shortness of breath: Chronic, related to Interstitial Lung Disease. Continueoxygen supplementation3-4L/Min, Albuterol, Duoneb as ordered.Patient in no respiratory distress during visit. Palliative will continue to monitor for symptom management/decline and make recommendations as needed. Return2 months or prn. Encouraged to call provider sooner with any concerns.    CHIEF COMPLAINT: Palliative follow up visit/neuropathic pain  HISTORY OF PRESENT ILLNESS:  Maurice Reed a 80 y.o. male with multiple medical problems including neuropathic pain to bilateral lower legs, acute on chronic, burning, shooting pain, worst when he puts his feet down to walk.  Patient not able to ambulate as before because of worsening neuropathic pain.  He said prayer helps him feel better.  He tried Gabapentin in the past and refused to take it because it made him drowsy. History of interstitial lung disease, COPD, [name], hypertension, psoriasis-on [place], followed by dermatologist, arthritis.  History obtained from review of EMR, discussion with patient/family. Records reviewed and summarized above. All 10 point systems reviewed and are negative except as documented in history of present illness above  Review and summarization of Epic records shows history from other than patient.   Palliative Care was asked to follow this patient by consultation request of Inda Coke, [place] to help  address complex decision making in the context of advance care planning and goals of care clarification.  PPS: 50%  ROS  Constitution: Denies fever/chills EYES: denies vision changes ENMT: denies Xerostomia,  dysphagia Cardiovascular: denies chest pain Pulmonary: denies  cough, dyspnea on mild-to-moderate exertion; ongoing oxygen supplementation Abdomen: endorses fair appetite, denies constipation or diarrhea GU: denies dysuria MSK:  endorses ROM limitations, no falls reported Skin: denies rashes/bruising Neurological: endorses weakness, endorsed shooting burning pain in bilateral lower extremities, denies insomnia Psych: Endorses positive mood Heme/lymph/immuno: denies bruises, no abnormal bleeding  PERTINENT MEDICATIONS:  Outpatient Encounter Medications as of 04/19/2021  Medication Sig  . Apremilast (OTEZLA PO) Take 30 mg by mouth 2 (two) times daily.  . Ascorbic Acid (VITAMIN C) 500 MG CAPS Take 1 capsule by mouth daily.  . Atropine Sulfate-NaCl 0.01-0.9 % SOLN Apply 1 drop to eye at bedtime.   . Cholecalciferol (VITAMIN D3) 3000 units TABS Take by mouth.  . clobetasol (TEMOVATE) 0.05 % external solution APPLY TO AFFECTED AREA(S)  ON SCALP TOPICALLY ONE TO  TWO TIMES A DAY AS NEEDED  . clobetasol (TEMOVATE) 0.05 % external solution Apply 1 application topically 2 (two) times daily.  Marland Kitchen desonide (DESOWEN) 0.05 % lotion Apply topically.  . docusate sodium (COLACE) 100 MG capsule Take 100 mg by mouth daily.   Marland Kitchen doxycycline (VIBRA-TABS) 100 MG tablet TAKE 1 TABLET BY MOUTH  TWICE DAILY  . ELIQUIS 5 MG TABS tablet TAKE 1 TABLET BY MOUTH  TWICE DAILY  . erythromycin ophthalmic ointment APP THIN LAYER IN OS BID  . ipratropium-albuterol (DUONEB) 0.5-2.5 (3) MG/3ML SOLN Take 3 mLs by nebulization every 6 (six) hours as needed.  . magnesium oxide (MAG-OX) 400 MG tablet TAKE 1 TABLET BY MOUTH TWICE A DAY  . metoprolol succinate (TOPROL-XL) 25 MG 24 hr tablet TAKE 1 TABLET BY MOUTH  DAILY   . mometasone (NASONEX) 50 MCG/ACT nasal spray Place 2 sprays into the nose daily. (Patient taking differently: Place 2 sprays into the nose as needed.)  . NARCAN 4 MG/0.1ML LIQD nasal spray kit INSTILL ONE SPRAY PRN FOR ACCIDENTAL OVERDOSE  . nystatin ointment (MYCOSTATIN) Apply 1 application topically 2 (two) times daily.  Marland Kitchen oxyCODONE (OXY IR/ROXICODONE) 5 MG immediate release tablet Take 5 mg by mouth 2 (two) times daily as needed. for pain  . potassium chloride (MICRO-K) 10 MEQ CR capsule TAKE 1 CAPSULE BY MOUTH  TWICE DAILY  . prednisoLONE acetate (PRED FORTE) 1 % ophthalmic suspension Place 1 drop into the left eye 2 (two) times daily.   . predniSONE (DELTASONE) 20 MG tablet Take 1 tablet (20 mg total) by mouth daily with breakfast. Take for 7 days  . tadalafil (CIALIS) 20 MG tablet Take 0.5-1 tablets (10-20 mg total) by mouth every other day as needed for erectile dysfunction.  . Tafluprost, PF, 0.0015 % SOLN Apply 1 drop to eye daily. Drop 1 drop into right at night  . tamsulosin (FLOMAX) 0.4 MG CAPS capsule TAKE 1 CAPSULE BY MOUTH AT  BEDTIME  . temazepam (RESTORIL) 15 MG capsule TAKE 1 CAPSULE (15 MG TOTAL) BY MOUTH AT BEDTIME AS NEEDED FOR SLEEP.  Marland Kitchen torsemide (DEMADEX) 20 MG tablet TAKE 2 TABLETS BY MOUTH IN THE AM AND 1 TAB IN THE EVENING  . triamcinolone ointment (KENALOG) 0.5 % APPLY TO AFFECTED AREA(S)  TOPICALLY 1 TO 2 TIMES  DAILY   No facility-administered encounter medications on file as of 04/19/2021.    HOSPICE ELIGIBILITY/DIAGNOSIS: TBD  PAST MEDICAL HISTORY:  Past Medical History:  Diagnosis Date  . Arthritis   . Atrial fibrillation ([place])   .  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 Diverticula, bladder   . Erectile dysfunction   . GERD (gastroesophageal reflux disease)   . Kidney stones   . Obesity   . Pulmonary embolism ([facility])      SOCIAL HX: '@SOCX'  Patient lives at home  for ongoing care  FAMILY HX:  Family History  Problem Relation Age  of Onset  . Arthritis Mother   . Hearing loss Mother   . Hypertension Mother   . Heart disease Mother   . Stroke Mother   . Arthritis Father   . Hearing loss Father   . Heart disease Father   . Hypertension Father   . Heart attack Father   . Kidney disease Father   . Stroke Father     Review lab tests/diagnostics Results for CINQUE, BEGLEY (MRN [id number]) as of 04/19/2021 11:56  Ref. Range 03/01/2021 15:06  COMPREHENSIVE METABOLIC PANEL Unknown Rpt (A)  Sodium Latest Ref Range: 135 - 145 mEq/L 140  Potassium Latest Ref Range: 3.5 - 5.1 mEq/L 3.7  Chloride Latest Ref Range: 96 - 112 mEq/L 98  CO2 Latest Ref Range: 19 - 32 mEq/L 37 (H)  Glucose Latest Ref Range: 70 - 99 mg/dL 125 (H)  BUN Latest Ref Range: 6 - 23 mg/dL 15  Creatinine Latest Ref Range: 0.40 - 1.50 mg/dL 0.86  Calcium Latest Ref Range: 8.4 - 10.5 mg/dL 10.0  Alkaline Phosphatase Latest Ref Range: 39 - 117 U/L 80  Albumin Latest Ref Range: 3.5 - 5.2 g/dL 3.6  AST Latest Ref Range: 0 - 37 U/L 15  ALT Latest Ref Range: 0 - 53 U/L 13  Total Protein Latest Ref Range: 6.0 - 8.3 g/dL 6.4  Total Bilirubin Latest Ref Range: 0.2 - 1.2 mg/dL 0.4  GFR Latest Ref Range: >60.00 mL/min 82.05   ALLERGIES:  Allergies  Allergen Reactions  . Other Itching    Cats: watery eyes, itching, sneezing     Thank you for the opportunity to participate in the care of Maurice Reed Please call our office at [phone] if we can be of additional assistance.  Note: Portions of this note were generated with [employer]. Dictation errors may occur despite best attempts at proofreading.  Teodoro Spray, NP

## 2021-04-23 ENCOUNTER — Other Ambulatory Visit: Payer: Self-pay | Admitting: Physician Assistant

## 2021-04-23 MED ORDER — TEMAZEPAM 15 MG PO CAPS: ORAL_CAPSULE | ORAL | 0 refills | Status: DC

## 2021-04-23 NOTE — Telephone Encounter (Signed)
  LAST APPOINTMENT DATE: 03/17/2021   NEXT APPOINTMENT DATE:@Visit  date not found  MEDICATION:temazepam (RESTORIL) 15 MG capsule  PHARMACY:[id number] - [place], [place] - [address]  Comments: Patient is completely out.   Let patient know to contact pharmacy at the end of the day to make sure medication is ready.  Please notify patient to allow 48-72 hours to process  Encourage patient to contact the pharmacy for refills or they can request refills through [facility]   Please advise

## 2021-04-23 NOTE — Addendum Note (Signed)
Addended by: Ardith Dark on: 04/23/2021 12:50 PM   Modules accepted: Orders

## 2021-04-25 ENCOUNTER — Other Ambulatory Visit: Payer: Self-pay | Admitting: Family Medicine

## 2021-05-14 ENCOUNTER — Telehealth: Payer: Self-pay | Admitting: Cardiovascular Disease

## 2021-05-14 NOTE — Telephone Encounter (Signed)
Spoke with pt wife, she reports the patient commonly goes in and out of atrial fib, the concern is yesterday he had an episode where his pulse rate dropped from 107 to 42 bpm and the patient felt he was going to pass out. He also had a pulling typr pain across his chest. He feels fine now and was calling just to make sure it was nothing to worry about. Advised I feel he needs to be seen to look over medications to make sure no adjustments need to be made or other medications considered due him now feeling faint. She agreed and follow up with the atrial fib clinic made. She will call back if the patient does not want the appointment.

## 2021-05-14 NOTE — Telephone Encounter (Signed)
[name], wife of the patient called.  The patient has been in afib more than the patient has been out of afib   Wife wanted to know if Dr. Allyson Sabal or an APP to be seen. Patient had a spell yesterday where he was having some chest discomfort. He described it as like a pulling sensation. It wasn't really pain and it didn't radiate to either arm  Patient c/o Palpitations:  High priority if patient c/o lightheadedness, shortness of breath, or chest pain  How long have you had palpitations/irregular HR/ Afib? Are you having the symptoms now?  No. Pt had spell yesterday that lasted ~ 30 min  Are you currently experiencing lightheadedness, SOB or CP? Lightheaded yesterday.  The patient is lightheaded pretty regularly d/t medications, but this was different than his normal   Do you have a history of afib (atrial fibrillation) or irregular heart rhythm? yes  Have you checked your BP or HR? (document readings if available): HR 42 during pain sx. There was a period of time when the pulse ox wouldn't read his readings   Are you experiencing any other symptoms? No strength, dizziness     STAT if patient feels like he/she is going to faint   Are you dizzy now? no  Do you feel faint or have you passed out? Felt like he was going to pass out yesterday  Do you have any other symptoms?   Have you checked your HR and BP (record if available)? HR 42 yesterday   107, 88 while on the phone

## 2021-05-24 ENCOUNTER — Ambulatory Visit (HOSPITAL_COMMUNITY): Payer: Medicare Other | Admitting: Physician Assistant

## 2021-06-01 ENCOUNTER — Other Ambulatory Visit: Payer: Self-pay

## 2021-06-01 ENCOUNTER — Ambulatory Visit: Payer: Medicare Other | Admitting: Primary Care

## 2021-06-01 ENCOUNTER — Telehealth: Payer: Self-pay | Admitting: *Deleted

## 2021-06-01 ENCOUNTER — Encounter: Payer: Self-pay | Admitting: Primary Care

## 2021-06-01 VITALS — BP 130/56 | HR 97 | Temp 98.5°F | Ht 70.0 in | Wt 270.4 lb

## 2021-06-01 DIAGNOSIS — G4733 Obstructive sleep apnea (adult) (pediatric): Secondary | ICD-10-CM

## 2021-06-01 DIAGNOSIS — J849 Interstitial pulmonary disease, unspecified: Secondary | ICD-10-CM | POA: Diagnosis not present

## 2021-06-01 DIAGNOSIS — J9611 Chronic respiratory failure with hypoxia: Secondary | ICD-10-CM

## 2021-06-01 NOTE — Telephone Encounter (Signed)
Pt's wife called stating that Maurice Reed needs the prescription of temazepam. She stated that the prescription needs to go to the [facility]. She did state that she tried to call it in to [facility] but they told her that they have not received the confirmation from [place] for them to fill the prescription.

## 2021-06-01 NOTE — Patient Instructions (Addendum)
Nice seeing you today, glad you are doing well  Continue to wear BIPAP every night, we will adjust setting  Recommend you try using chin strap at night with bipap  Orders: DME order for new BIPAP machine Change BIPAP setting to IPAP 16 cm h20/ EPAP 10 cm h20   Follow-up: 6 months with Dr. Vassie Loll

## 2021-06-01 NOTE — Assessment & Plan Note (Signed)
-   Indeterminate for UIP. Patient is a former smoker who worked in [employer]. Lung changes are attributed to asbestos and cotton dust exposure. I would hold off on repeating PFTs or imaging as his symptoms are currently stable. He is following with palliative care.

## 2021-06-01 NOTE — Assessment & Plan Note (Signed)
-   Stable; continues to benefit from supplemental oxygen - Baseline he uses 3L oxygen at home and at night;  5L when using POC

## 2021-06-01 NOTE — Progress Notes (Signed)
'@Patient'  ID: Maurice Reed, male    DOB: [birth year], 80 y.o.   MRN: [id number]  No chief complaint on file.   Referring provider: Inda Coke, PA  HPI: 80 year old male, former smoker quit 1985 (75-pack-year history).  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 (on chronic anticoagulation), OSA on BiPAP, CHF, chronic pain.  Patient of Dr. [name], last seen by pulmonary nurse practitioner 08/21/2020.   Previous [facility] pulmonary encounter:  08/21/2020  - Visit   80 year old male former smoker followed in our office for interstitial lung disease, chronic respiratory failure and obstructive sleep apnea managed on BiPAP.  He is followed by Dr. [name].  Patient was last seen by Dr. [name] in June/2021.  Plan of care at that office visit was as follows: Encourage BiPAP use, it was recommended that he participate in pulmonary rehab but unfortunately patient has no way to obtain transportation, it was discussed whether or not he would benefit from starting antifibrotic's, shared decision making visit took place and patient declined starting antifibrotic's.   Patient presenting today with his spouse.  Reporting that breathing has worsened.  He is maintained on 3 to 4 L continuous with physical exertion.  4 to 5 L pulsed O2 via POC with physical exertion.   He recently established with palliative care.  They are unsure whether or not he could qualify for hospice.  We will discuss this today.   Patient continues to decline antifibrotic's.  Patient is also not vaccinated for COVID-19.  He declines this recommendation today.  He is agreeable to receive the flu vaccine from primary care.   Patient continues to have persistent dyspnea.  He attributes this to his likely progression of his interstitial lung disease, sedentary lifestyle, chronic respiratory failure as well as persistent [name].  He remains adherent to  using his BiPAP.   Patient reporting today that he is completed a DNR form.  They are also working on obtaining healthcare power of attorney information.   06/01/2021- Interim hx  Patient presents today for 15-monthfollow-up OSA, ILD and chronic respiratory failure. During last visit he was referred to hospice. Maintained on BIPAP. Feels he is not getting enough pressure. His machines is closed to 80years old. He sleeps a couple of hours at night and wakes up. This is due to discomfort of his back when lying in bed. Breathing ok. He has actually been doing better the last several months. Walking short distances he is ok. Hospice came out but they did not cover his Eliquis medication. He has filed paper work for patient assistance. He is not currently in afib. He is established with palliative care who checks in on him every 3 months. He is using 3L oxygen while at home and at night, and uses 5L POC when outside the house. He is taking total of 652mTorsemide daily. Swlling and weight are stable. He is wearing compression stockings today. Denies chest tightness, wheezing or productive cough.  Airview download 05/01/21-05/30/21 30/30 days (100%); 30 days > 4 hours Average usage 11 hours 2 mins Pressure 14/8 cm h20 Airleaks 34.3L/min (95%) AHI 0.3   Tests:  12/2017  CT chest Prominent bilateral calcific pleural plaques consistent with provided clinical history of asbestos related pleural disease. Features of asbestosis are NOT  identified.   01/2018 titration >> bipap 18/12   11/2017 spiro ratio 86, FEV 1 45%, FVC 39 % , severe restriction  ABG 12/2017 7.42/45/82/ 97% RA    PFT 02/2019 no airway obstruction, ratio 86, FEV1 60%, F VC 50%, no bronchodilator response, TLC 48%, DLCO 66% , corrects for alveolar volume-moderate intraparenchymal restriction   Echo 07/2018 RVSP 57   09/17/2019-CT chest high-res-calcified pleural plaques bilaterally, compatible with asbestosis related pleural disease, there  are findings compatible with interstitial lung disease with spectrum of findings indeterminate for UIP, dilatation of pulmonary trunk concerning for associated pulmonary arterial hypertension   03/15/2019-echocardiogram-LV ejection fraction 60 to 65%, right ventricle has normal systolic function, left atrial size mildly dilated, mild thickening of the mitral valve leaflet, mild calcification of mitral valve leaflet, aortic valve has indeterminate number of cusp, severe thickening of the aortic valve, sclerosis without any evidence of stenosis in the aortic valve     Allergies  Allergen Reactions   Other Itching    Cats: watery eyes, itching, sneezing    Immunization History  Administered Date(s) Administered   Fluad Quad(high Dose 65+) 08/23/2019, 09/08/2020   Influenza, High Dose Seasonal PF 09/21/2018   Influenza, Seasonal, Injecte, Preservative Fre 09/28/2017   Pneumococcal Polysaccharide-23 12/14/2015    Past Medical History:  Diagnosis Date   Arthritis    Atrial fibrillation ([place])    Blind left eye 2005   was a result of cataract surgery   Diverticula, bladder    Erectile dysfunction    GERD (gastroesophageal reflux disease)    Kidney stones    Obesity    Pulmonary embolism ([place])     Tobacco History: Social History   Tobacco Use  Smoking Status Former   Packs/day: 3.00   Years: 25.00   Pack years: 75.00   Types: Cigarettes   Quit date: 11/28/1984   Years since quitting: 36.5  Smokeless Tobacco Never   Counseling given: Not Answered   Outpatient Medications Prior to Visit  Medication Sig Dispense Refill   Apremilast (OTEZLA PO) Take 30 mg by mouth 2 (two) times daily.     Ascorbic Acid (VITAMIN C) 500 MG CAPS Take 1 capsule by mouth daily.     Atropine Sulfate-NaCl 0.01-0.9 % SOLN Apply 1 drop to eye at bedtime.      Cholecalciferol (VITAMIN D3) 3000 units TABS Take by mouth.     clobetasol (TEMOVATE) 0.05 % external solution APPLY TO AFFECTED AREA(S)  ON  SCALP TOPICALLY ONE TO  TWO TIMES A DAY AS NEEDED 150 mL 4   clobetasol (TEMOVATE) 0.05 % external solution Apply 1 application topically 2 (two) times daily. 50 mL 2   desonide (DESOWEN) 0.05 % lotion Apply topically.     docusate sodium (COLACE) 100 MG capsule Take 100 mg by mouth daily.      doxycycline (VIBRA-TABS) 100 MG tablet TAKE 1 TABLET BY MOUTH  TWICE DAILY 180 tablet 0   ELIQUIS 5 MG TABS tablet TAKE 1 TABLET BY MOUTH  TWICE DAILY 180 tablet 0   erythromycin ophthalmic ointment APP THIN LAYER IN OS BID  1   ipratropium-albuterol (DUONEB) 0.5-2.5 (3) MG/3ML SOLN Take 3 mLs by nebulization every 6 (six) hours as needed. 360 mL 3   magnesium oxide (MAG-OX) 400 MG tablet TAKE 1 TABLET BY MOUTH TWICE A DAY 180 tablet 2   metoprolol succinate (TOPROL-XL) 25 MG 24 hr tablet TAKE 1 TABLET BY MOUTH  DAILY 90 tablet 3   mometasone (NASONEX) 50 MCG/ACT nasal spray Place 2 sprays into the nose daily. (Patient taking differently: Place 2 sprays into the nose as needed.) 51 g  2   NARCAN 4 MG/0.1ML LIQD nasal spray kit INSTILL ONE SPRAY PRN FOR ACCIDENTAL OVERDOSE  0   nystatin ointment (MYCOSTATIN) Apply 1 application topically 2 (two) times daily. 30 g 1   oxyCODONE (OXY IR/ROXICODONE) 5 MG immediate release tablet Take 5 mg by mouth 2 (two) times daily as needed. for pain  0   potassium chloride (MICRO-K) 10 MEQ CR capsule TAKE 1 CAPSULE BY MOUTH  TWICE DAILY 180 capsule 0   prednisoLONE acetate (PRED FORTE) 1 % ophthalmic suspension Place 1 drop into the left eye 2 (two) times daily.      predniSONE (DELTASONE) 20 MG tablet Take 1 tablet (20 mg total) by mouth daily with breakfast. Take for 7 days 7 tablet 0   tadalafil (CIALIS) 20 MG tablet Take 0.5-1 tablets (10-20 mg total) by mouth every other day as needed for erectile dysfunction. 5 tablet 11   Tafluprost, PF, 0.0015 % SOLN Apply 1 drop to eye daily. Drop 1 drop into right at night     tamsulosin (FLOMAX) 0.4 MG CAPS capsule TAKE 1 CAPSULE  BY MOUTH AT  BEDTIME 90 capsule 3   temazepam (RESTORIL) 15 MG capsule TAKE 1 CAPSULE (15 MG TOTAL) BY MOUTH AT BEDTIME AS NEEDED FOR SLEEP. 30 capsule 0   torsemide (DEMADEX) 20 MG tablet TAKE 2 TABLETS BY MOUTH IN THE AM AND 1 TAB IN THE EVENING 270 tablet 3   triamcinolone ointment (KENALOG) 0.5 % APPLY TO AFFECTED AREA(S)  TOPICALLY 1 TO 2 TIMES  DAILY 45 g 2   No facility-administered medications prior to visit.      Review of Systems  Review of Systems  Constitutional:  Negative for fatigue.  Respiratory:  Negative for cough, chest tightness, shortness of breath and wheezing.        Dyspnea with long distances  Cardiovascular:  Positive for leg swelling.    Physical Exam  BP (!) 130/56 (BP Location: Left Arm, Patient Position: Sitting, Cuff Size: Normal)   Pulse 97   Temp 98.5 F (36.9 C) (Oral)   Ht '5\' 10"'  (1.778 m)   Wt 270 lb 6.4 oz (122.7 kg)   SpO2 99%   BMI 38.80 kg/m  Physical Exam Constitutional:      General: He is not in acute distress.    Appearance: Normal appearance. He is obese. He is not ill-appearing.  HENT:     Head: Normocephalic and atraumatic.     Mouth/Throat:     Mouth: Mucous membranes are dry.     Pharynx: Oropharynx is clear.  Cardiovascular:     Rate and Rhythm: Normal rate and regular rhythm.     Comments: RRR, hx afib; +3BLE edema Pulmonary:     Effort: Pulmonary effort is normal.     Breath sounds: Rales present.     Comments: Fine crackles at bases Musculoskeletal:     Comments: In rolling WC  Neurological:     General: No focal deficit present.     Mental Status: He is alert and oriented to person, place, and time. Mental status is at baseline.  Psychiatric:        Mood and Affect: Mood normal.        Behavior: Behavior normal.        Thought Content: Thought content normal.        Judgment: Judgment normal.     Lab Results:  CBC    Component Value Date/Time   WBC 6.3 03/01/2021 1506  RBC 3.70 (L) 03/01/2021 1506    HGB 13.0 03/01/2021 1506   HCT 38.4 (L) 03/01/2021 1506   PLT 199.0 03/01/2021 1506   MCV 103.7 (H) 03/01/2021 1506   MCH 34.5 (H) 07/22/2020 1201   MCHC 33.9 03/01/2021 1506   RDW 13.4 03/01/2021 1506   LYMPHSABS 1.6 03/01/2021 1506   MONOABS 0.6 03/01/2021 1506   EOSABS 0.1 03/01/2021 1506   BASOSABS 0.1 03/01/2021 1506    BMET    Component Value Date/Time   NA 140 03/01/2021 1506   NA 143 05/22/2020 1142   K 3.7 03/01/2021 1506   CL 98 03/01/2021 1506   CO2 37 (H) 03/01/2021 1506   GLUCOSE 125 (H) 03/01/2021 1506   BUN 15 03/01/2021 1506   BUN 11 05/22/2020 1142   CREATININE 0.86 03/01/2021 1506   CREATININE 0.90 07/22/2020 1201   CALCIUM 10.0 03/01/2021 1506   GFRNONAA 80 05/22/2020 1142   GFRAA 92 05/22/2020 1142    BNP    Component Value Date/Time   BNP 39.5 12/19/2019 1634   BNP 65.0 12/17/2019 1630   BNP 67 12/13/2019 1512    ProBNP No results found for: PROBNP  Imaging: No results found.   Assessment & Plan:   OSA treated with BiPAP - Patient is compliant with BIPAP, feels he is not getting enough pressure and reports dry mouth. He is also having mild-moderate amount of airleaks.  - Current pressure 14/8cm h20; residual AHI 0.3 - Due for new BIPAP machine, change pressure setting to 16/10cm h20 - Recommend he try using chin strap at night   ILD (interstitial lung disease) (HCC) - Indeterminate for UIP. Patient is a former smoker who worked in [employer] fund[employer]. Lung changes are attributed to asbestos and cotton dust exposure. I would hold off on repeating PFTs or imaging as his symptoms are currently stable. He is following with palliative care.   Chronic respiratory failure with hypoxia (HCC) - Stable; continues to benefit from supplemental oxygen - Baseline he uses 3L oxygen at home and at night;  5L when using POC   40 mins spent with >50% face-to-face with patient    Martyn Ehrich, NP 06/01/2021

## 2021-06-01 NOTE — Telephone Encounter (Signed)
Spoke with pt wife, aware patient assistance paperwork placed at the front desk for patient pick up

## 2021-06-01 NOTE — Assessment & Plan Note (Addendum)
-   Patient is compliant with BIPAP, feels he is not getting enough pressure and reports dry mouth. He is also having mild-moderate amount of airleaks.  - Current pressure 14/8cm h20; residual AHI 0.3 - Due for new BIPAP machine, change pressure setting to 16/10cm h20 - Recommend he try using chin strap at night

## 2021-06-02 MED ORDER — TEMAZEPAM 15 MG PO CAPS: ORAL_CAPSULE | ORAL | 1 refills | Status: DC

## 2021-06-02 NOTE — Telephone Encounter (Signed)
Left message on voicemail to call office.  

## 2021-06-02 NOTE — Telephone Encounter (Signed)
Pt's wife [name] called back told her Rx for pt was sent to [facility] 90 day supply. [name] verbalized understanding.

## 2021-06-02 NOTE — Telephone Encounter (Signed)
Pt requesting refill for Temazepam 15 mg. Last OV 03/17/2021.

## 2021-06-08 ENCOUNTER — Ambulatory Visit (HOSPITAL_COMMUNITY)
Admission: RE | Admit: 2021-06-08 | Discharge: 2021-06-08 | Disposition: A | Discharge status: Home / Self Care | Payer: Medicare Other | Source: Ambulatory Visit | Attending: Physician Assistant | Admitting: Physician Assistant

## 2021-06-08 ENCOUNTER — Other Ambulatory Visit: Payer: Self-pay

## 2021-06-08 VITALS — BP 120/56 | HR 97 | Ht 70.0 in | Wt 273.8 lb

## 2021-06-08 DIAGNOSIS — D6869 Other thrombophilia: Secondary | ICD-10-CM | POA: Insufficient documentation

## 2021-06-08 DIAGNOSIS — I10 Essential (primary) hypertension: Secondary | ICD-10-CM | POA: Diagnosis not present

## 2021-06-08 DIAGNOSIS — Z6839 Body mass index (BMI) 39.0-39.9, adult: Secondary | ICD-10-CM | POA: Insufficient documentation

## 2021-06-08 DIAGNOSIS — G4733 Obstructive sleep apnea (adult) (pediatric): Secondary | ICD-10-CM | POA: Insufficient documentation

## 2021-06-08 DIAGNOSIS — Z8249 Family history of ischemic heart disease and other diseases of the circulatory system: Secondary | ICD-10-CM | POA: Insufficient documentation

## 2021-06-08 DIAGNOSIS — Z87891 Personal history of nicotine dependence: Secondary | ICD-10-CM | POA: Insufficient documentation

## 2021-06-08 DIAGNOSIS — E669 Obesity, unspecified: Secondary | ICD-10-CM | POA: Insufficient documentation

## 2021-06-08 DIAGNOSIS — I4821 Permanent atrial fibrillation: Secondary | ICD-10-CM

## 2021-06-08 DIAGNOSIS — Z79899 Other long term (current) drug therapy: Secondary | ICD-10-CM | POA: Diagnosis not present

## 2021-06-08 DIAGNOSIS — I4811 Longstanding persistent atrial fibrillation: Secondary | ICD-10-CM | POA: Diagnosis present

## 2021-06-08 DIAGNOSIS — Z7901 Long term (current) use of anticoagulants: Secondary | ICD-10-CM | POA: Diagnosis not present

## 2021-06-08 DIAGNOSIS — Z86718 Personal history of other venous thrombosis and embolism: Secondary | ICD-10-CM | POA: Insufficient documentation

## 2021-06-08 DIAGNOSIS — R0789 Other chest pain: Secondary | ICD-10-CM | POA: Diagnosis not present

## 2021-06-08 MED ORDER — DILTIAZEM HCL 30 MG PO TABS: ORAL_TABLET | ORAL | 1 refills | Status: DC

## 2021-06-08 NOTE — Patient Instructions (Signed)
Cardizem 30mg -- take 1 tablet every 4 hours AS NEEDED for AFIB heart rate >100 as long as top number of blood pressure >100.   ?

## 2021-06-08 NOTE — Progress Notes (Signed)
Primary Care Physician: Inda Coke, [place] Primary Cardiologist: Dr Gwenlyn Found Primary Electrophysiologist: none Referring Physician: [facility]   Maurice Reed is a 80 y.o. male with a history of HTN, OSA, tobacco abuse (quit 1985), ILD, DVT/PE 2002, and atrial fibrillation who presents for consultation in the [facility].  The patient was initially diagnosed with atrial fibrillation remotely and appears to be permanently in afib since at least 2019. Patient is on Eliquis for a CHADS2VASC score of 4. Patient reports that he had an episode of CP on 05/14/21. The pain was sharp and lasted only 1-2 seconds and occurred at rest. He does report that his heart rates will occassionally go above 120 bpm. He denies any bleeding issues on anticoagulation.   Today, he denies symptoms of palpitations, PND, lower extremity edema, dizziness, presyncope, syncope, snoring, daytime somnolence, bleeding, or neurologic sequela. The patient is tolerating medications without difficulties and is otherwise without complaint today.    Atrial Fibrillation Risk Factors:  he does have symptoms or diagnosis of sleep apnea. he is compliant with CPAP therapy. he does not have a history of rheumatic fever.   he has a BMI of Body mass index is 39.29 kg/m.Marland Kitchen Filed Weights   06/08/21 1415  Weight: 124.2 kg    Family History  Problem Relation Age of Onset   Arthritis Mother    Hearing loss Mother    Hypertension Mother    Heart disease Mother    Stroke Mother    Arthritis Father    Hearing loss Father    Heart disease Father    Hypertension Father    Heart attack Father    Kidney disease Father    Stroke Father      Atrial Fibrillation Management history:  Previous antiarrhythmic drugs: none Previous cardioversions: none Previous ablations: none CHADS2VASC score: 4 Anticoagulation history: Eliquis   Past Medical History:  Diagnosis Date   Arthritis    Atrial  fibrillation ([place])    Blind left eye 2005   was a result of cataract surgery   Diverticula, bladder    Erectile dysfunction    GERD (gastroesophageal reflux disease)    Kidney stones    Obesity    Pulmonary embolism ([place])    Past Surgical History:  Procedure Laterality Date   APPENDECTOMY  1963   LITHOTRIPSY  1987   NOSE SURGERY  2003   TONSILLECTOMY AND ADENOIDECTOMY  7618   UMBILICAL HERNIA REPAIR  1984    Current Outpatient Medications  Medication Sig Dispense Refill   Apremilast (OTEZLA PO) Take 30 mg by mouth 2 (two) times daily.     Ascorbic Acid (VITAMIN C) 500 MG CAPS Take 1 capsule by mouth daily.     Atropine Sulfate-NaCl 0.01-0.9 % SOLN Apply 1 drop to eye at bedtime.      Cholecalciferol (VITAMIN D3) 3000 units TABS Take 1 tablet by mouth daily.     clobetasol (TEMOVATE) 0.05 % external solution APPLY TO AFFECTED AREA(S)  ON SCALP TOPICALLY ONE TO  TWO TIMES A DAY AS NEEDED 150 mL 4   clobetasol (TEMOVATE) 0.05 % external solution Apply 1 application topically 2 (two) times daily. 50 mL 2   desonide (DESOWEN) 0.05 % lotion Apply 1 application topically as needed.     diltiazem (CARDIZEM) 30 MG tablet Take 1 tablet every 4 hours AS NEEDED for heart rate >100 45 tablet 1   docusate sodium (COLACE) 100 MG capsule Take 100 mg by  mouth daily.      doxycycline (VIBRA-TABS) 100 MG tablet TAKE 1 TABLET BY MOUTH  TWICE DAILY 180 tablet 0   ELIQUIS 5 MG TABS tablet TAKE 1 TABLET BY MOUTH  TWICE DAILY 180 tablet 0   erythromycin ophthalmic ointment APP THIN LAYER IN OS BID  1   ipratropium-albuterol (DUONEB) 0.5-2.5 (3) MG/3ML SOLN Take 3 mLs by nebulization every 6 (six) hours as needed. 360 mL 3   ketorolac (ACULAR) 0.5 % ophthalmic solution Place 1 drop into the right eye 4 (four) times daily.     magnesium oxide (MAG-OX) 400 MG tablet TAKE 1 TABLET BY MOUTH TWICE A DAY 180 tablet 2   metoprolol succinate (TOPROL-XL) 25 MG 24 hr tablet TAKE 1 TABLET BY MOUTH  DAILY 90 tablet 3    mometasone (NASONEX) 50 MCG/ACT nasal spray Place 2 sprays into the nose daily. (Patient taking differently: Place 2 sprays into the nose as needed.) 51 g 2   NARCAN 4 MG/0.1ML LIQD nasal spray kit INSTILL ONE SPRAY PRN FOR ACCIDENTAL OVERDOSE  0   nystatin ointment (MYCOSTATIN) Apply 1 application topically 2 (two) times daily. 30 g 1   oxyCODONE (OXY IR/ROXICODONE) 5 MG immediate release tablet Take 5 mg by mouth 3 (three) times daily as needed. for pain  0   potassium chloride (MICRO-K) 10 MEQ CR capsule TAKE 1 CAPSULE BY MOUTH  TWICE DAILY 180 capsule 0   prednisoLONE acetate (PRED FORTE) 1 % ophthalmic suspension Place 1 drop into the left eye 2 (two) times daily.      tadalafil (CIALIS) 20 MG tablet Take 0.5-1 tablets (10-20 mg total) by mouth every other day as needed for erectile dysfunction. 5 tablet 11   tamsulosin (FLOMAX) 0.4 MG CAPS capsule TAKE 1 CAPSULE BY MOUTH AT  BEDTIME 90 capsule 3   temazepam (RESTORIL) 15 MG capsule TAKE 1 CAPSULE (15 MG TOTAL) BY MOUTH AT BEDTIME AS NEEDED FOR SLEEP. 90 capsule 1   torsemide (DEMADEX) 20 MG tablet TAKE 2 TABLETS BY MOUTH IN THE AM AND 1 TAB IN THE EVENING 270 tablet 3   TRIAMCINOLONE PO Uses ointment as needed     No current facility-administered medications for this encounter.    Allergies  Allergen Reactions   Other Itching    Cats: watery eyes, itching, sneezing    Social History   Socioeconomic History   Marital status: Married    Spouse name: Not on file   Number of children: Not on file   Years of education: Not on file   Highest education level: Not on file  Occupational History   Not on file  Tobacco Use   Smoking status: Former    Packs/day: 3.00    Years: 25.00    Pack years: 75.00    Types: Cigarettes    Quit date: 11/28/1984    Years since quitting: 36.5   Smokeless tobacco: Never  Vaping Use   Vaping Use: Never used  Substance and Sexual Activity   Alcohol use: Never   Drug use: Yes    Types:  Marijuana    Comment: Gummies    Sexual activity: Yes  Other Topics Concern   Not on file  Social History Narrative   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6)   Married to [employer] ([place] patient)   7 children   Currently lives with son in [place], [place]; all their things are in storage; plan to  travel and stay with kids but keep their home base in the [place] area   Right Handed   Social Determinants of Health   Financial Resource Strain: Not on file  Food Insecurity: Not on file  Transportation Needs: Not on file  Physical Activity: Not on file  Stress: Not on file  Social Connections: Not on file  Intimate Partner Violence: Not on file     ROS- All systems are reviewed and negative except as per the HPI above.  Physical Exam: Vitals:   06/08/21 1415  BP: (!) 120/56  Pulse: 97  Weight: 124.2 kg  Height: '5\' 10"'  (1.778 m)    GEN- The patient is a well appearing obese elderly male, alert and oriented x 3 today.   Head- normocephalic, atraumatic Eyes-  Sclera clear, conjunctiva pink Ears- hearing intact Oropharynx- clear Neck- supple  Lungs- Clear to ausculation bilaterally, diminished breath sounds, normal work of breathing Heart- irregular rate and rhythm, no murmurs, rubs or gallops  GI- soft, NT, ND, + BS Extremities- no clubbing, cyanosis, or edema MS- no significant deformity or atrophy Skin- no rash or lesion Psych- euthymic mood, full affect Neuro- strength and sensation are intact  Wt Readings from Last 3 Encounters:  06/08/21 124.2 kg  06/01/21 122.7 kg  03/17/21 120.7 kg    EKG today demonstrates  Afib Vent. rate 97 BPM PR interval * ms QRS duration 94 ms QT/QTcB 364/462 ms  Echo 04/27/20 demonstrated  1. Left ventricular ejection fraction, by estimation, is 60 to 65%. The  left ventricle has normal function. The left ventricle has no regional  wall motion abnormalities. There is moderate concentric  left ventricular  hypertrophy. Left ventricular  diastolic parameters are indeterminate.   2. Right ventricular systolic function is normal. The right ventricular  size is normal. There is moderately elevated pulmonary artery systolic  pressure.   3. Left atrial size was mildly dilated.   4. The mitral valve is normal in structure. Trivial mitral valve  regurgitation. No evidence of mitral stenosis.   5. The aortic valve is tricuspid. Aortic valve regurgitation is not  visualized. Mild aortic valve stenosis. Aortic valve area, by VTI measures  1.26 cm. Aortic valve mean gradient measures 8.7 mmHg. Aortic valve Vmax  measures 1.85 m/s.   6. The inferior vena cava is normal in size with greater than 50%  respiratory variability, suggesting right atrial pressure of 3 mmHg.   Epic records are reviewed at length today  CHA2DS2-VASc Score = 4  The patient's score is based upon: CHF History: No HTN History: Yes Diabetes History: No Stroke History: No Vascular Disease History: Yes (DVT/PE) Age Score: 2 Gender Score: 0     ASSESSMENT AND PLAN: 1. Permanent Atrial Fibrillation (ICD10:  I48.11) The patient's CHA2DS2-VASc score is 4, indicating a 4.8% annual risk of stroke.   Continue Toprol 25 mg daily Continue Eliquis 5 mg BID Start diltiazem 30 mg PRN q 4 hours for HR >110 bpm. Would favor conservative rate control strategy given age and comorbidities.   2. Secondary Hypercoagulable State (ICD10:  D68.69) The patient is at significant risk for stroke/thromboembolism based upon his CHA2DS2-VASc Score of 4.  Continue Apixaban (Eliquis).   3. Obesity Body mass index is 39.29 kg/m. Lifestyle modification was discussed at length including regular exercise and weight reduction.  4. Obstructive sleep apnea The importance of adequate treatment of sleep apnea was discussed today in order to improve our ability to maintain sinus rhythm long term.  Patient reports compliance with VPAP  therapy.  5. HTN Stable, no changes today.  6. Atypical CP Does not appear cardiac, no recurrence.    Follow up with Dr Gwenlyn Found per recall. AF clinic as needed.    [facility] [address] [place] [employer], [place] [address] [phone] 06/08/2021 4:29 PM

## 2021-06-10 ENCOUNTER — Other Ambulatory Visit: Payer: Medicare Other | Admitting: Hospice

## 2021-06-10 ENCOUNTER — Other Ambulatory Visit: Payer: Self-pay

## 2021-06-10 DIAGNOSIS — Z515 Encounter for palliative care: Secondary | ICD-10-CM

## 2021-06-10 DIAGNOSIS — M792 Neuralgia and neuritis, unspecified: Secondary | ICD-10-CM

## 2021-06-10 DIAGNOSIS — J849 Interstitial pulmonary disease, unspecified: Secondary | ICD-10-CM

## 2021-06-10 NOTE — Progress Notes (Signed)
[facility] Palliative Care Consult Note Telephone: [phone]  Fax: [phone]  PATIENT NAME: Maurice Reed DOB: 06-10-1941 MRN: [id number]  PRIMARY CARE PROVIDER:   Inda Coke, [place], [place], [place] [address] [place],  [place] [address]  REFERRING PROVIDER: Inda Coke, [place] Inda Coke, [place] [address] [place],  [place] [address]  RESPONSIBLE PARTY:  Self [id number] [phone] - spouse - best to call  Extended Emergency Contact Information Primary Emergency Contact: Maurice Reed, Maurice Reed Mobile Phone: [phone] Relation: Spouse Secondary Emergency Contact: Maurice Reed Mobile Phone: [phone] Relation: Granddaughter Contact Information     Name Relation Home Work Mobile   Maurice Reed, Maurice Reed Spouse   [phone]   Maurice Reed   [phone]      TELEHEALTH VISIT STATEMENT Due to the COVID-19 crisis, this visit was done via telemedicine and it was initiated and consent by this patient and or family. Video-audio (telehealth) contact was unable to be done due to technical barriers from the patient's side.   Visit is to build trust and highlight Palliative Medicine as specialized medical care for people living with serious illness, aimed at facilitating better quality of life through symptoms relief, assisting with advance care planning and complex medical decision making.  Maurice Reed with patient during visit.  This is a follow up visit.  RECOMMENDATIONS/PLAN:   Advance Care Planning/Code Status: Patient is a DO NOT RESUSCITATE  Goals of Care: Goals of care include to maximize quality of life and symptom management.  MOST selections include DO NOT RESUSCITATE, limited additional intervention, IV fluids for defined trial period, antibiotics as indicated, no feeding tube.  Family is open to hospice services in the future.  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 Lyrica 25 mg p.o. BID daily for start recommended.  Left shoulder pain: Patient is followed by Ortho for cortisone shots.  Interstitial lung disease: Continue oxygen supplementation 3-4L/Min, Albuterol, Duoneb as ordered.  Patient in no respiratory distress during visit.  Follow up: Palliative care will continue to follow for complex medical decision making, advance care planning, and clarification of goals. Return 6 weeks or prn. Encouraged to call provider sooner with any concerns.  CHIEF COMPLAINT: Palliative follow up  HISTORY OF PRESENT ILLNESS:  Maurice Reed a 80 y.o. 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, psoriasis-on [place] followed by dermatologist, arthritis.  Patient in no acute distress, reports doing well overall; no fall or hospitalizations since last visit.  History obtained from review of EMR, discussion with primary team, family and/or patient. Records reviewed and summarized above. All 10 point systems reviewed and are negative except as documented in history of present illness above  Review and summarization of Epic records shows history from other than patient.   Palliative Care was asked to follow this patient o help address complex decision making in the context of advance care planning and goals of care clarification.     PERTINENT MEDICATIONS:  Outpatient Encounter Medications as of 06/10/2021  Medication Sig   Apremilast (OTEZLA PO) Take 30 mg by mouth 2 (two) times daily.   Ascorbic Acid (VITAMIN C) 500 MG CAPS Take 1 capsule by mouth daily.   Atropine Sulfate-NaCl 0.01-0.9 % SOLN Apply 1 drop to eye at bedtime.    Cholecalciferol (VITAMIN D3)  3000 units TABS Take 1 tablet by mouth daily.   clobetasol (TEMOVATE) 0.05 % external solution APPLY TO AFFECTED  AREA(S)  ON SCALP TOPICALLY ONE TO  TWO TIMES A DAY AS NEEDED   clobetasol (TEMOVATE) 0.05 % external solution Apply 1 application topically 2 (two) times daily.   desonide (DESOWEN) 0.05 % lotion Apply 1 application topically as needed.   diltiazem (CARDIZEM) 30 MG tablet Take 1 tablet every 4 hours AS NEEDED for heart rate >100   docusate sodium (COLACE) 100 MG capsule Take 100 mg by mouth daily.    doxycycline (VIBRA-TABS) 100 MG tablet TAKE 1 TABLET BY MOUTH  TWICE DAILY   ELIQUIS 5 MG TABS tablet TAKE 1 TABLET BY MOUTH  TWICE DAILY   erythromycin ophthalmic ointment APP THIN LAYER IN OS BID   ipratropium-albuterol (DUONEB) 0.5-2.5 (3) MG/3ML SOLN Take 3 mLs by nebulization every 6 (six) hours as needed.   ketorolac (ACULAR) 0.5 % ophthalmic solution Place 1 drop into the right eye 4 (four) times daily.   magnesium oxide (MAG-OX) 400 MG tablet TAKE 1 TABLET BY MOUTH TWICE A DAY   metoprolol succinate (TOPROL-XL) 25 MG 24 hr tablet TAKE 1 TABLET BY MOUTH  DAILY   mometasone (NASONEX) 50 MCG/ACT nasal spray Place 2 sprays into the nose daily. (Patient taking differently: Place 2 sprays into the nose as needed.)   NARCAN 4 MG/0.1ML LIQD nasal spray kit INSTILL ONE SPRAY PRN FOR ACCIDENTAL OVERDOSE   nystatin ointment (MYCOSTATIN) Apply 1 application topically 2 (two) times daily.   oxyCODONE (OXY IR/ROXICODONE) 5 MG immediate release tablet Take 5 mg by mouth 3 (three) times daily as needed. for pain   potassium chloride (MICRO-K) 10 MEQ CR capsule TAKE 1 CAPSULE BY MOUTH  TWICE DAILY   prednisoLONE acetate (PRED FORTE) 1 % ophthalmic suspension Place 1 drop into the left eye 2 (two) times daily.    tadalafil (CIALIS) 20 MG tablet Take 0.5-1 tablets (10-20 mg total) by mouth every other day as needed for erectile dysfunction.   tamsulosin (FLOMAX) 0.4 MG CAPS capsule TAKE 1 CAPSULE BY MOUTH AT  BEDTIME   temazepam (RESTORIL) 15 MG capsule TAKE 1 CAPSULE (15 MG TOTAL) BY MOUTH AT BEDTIME AS  NEEDED FOR SLEEP.   torsemide (DEMADEX) 20 MG tablet TAKE 2 TABLETS BY MOUTH IN THE AM AND 1 TAB IN THE EVENING   TRIAMCINOLONE PO Uses ointment as needed   No facility-administered encounter medications on file as of 06/10/2021.    HOSPICE ELIGIBILITY/DIAGNOSIS: TBD  PAST MEDICAL HISTORY:  Past Medical History:  Diagnosis Date   Arthritis    Atrial fibrillation ([place])    Blind left eye 2005   was a result of cataract surgery   Diverticula, bladder    Erectile dysfunction    GERD (gastroesophageal reflux disease)    Kidney stones    Obesity    Pulmonary embolism ([facility])      Review lab tests/diagnostics No results for input(s): WBC, HGB, HCT, PLT, MCV in the last 168 hours. No results for input(s): NA, K, CL, CO2, BUN, CREATININE, GLUCOSE in the last 168 hours. CrCl cannot be calculated (Patient's most recent lab result is older than the maximum 21 days allowed.).  ALLERGIES:  Allergies  Allergen Reactions   Other Itching    Cats: watery eyes, itching, sneezing      I spent 45 minutes providing this consultation; this includes time spent with patient/family, chart review and documentation. More than 50% of  the time in this consultation was spent on counseling and coordinating communication   Thank you for the opportunity to participate in the care of Maurice Reed Please call our office at [phone] if we can be of additional assistance.  Note: Portions of this note were generated with [employer]. Dictation errors may occur despite best attempts at proofreading.  Teodoro Spray, NP

## 2021-06-14 ENCOUNTER — Ambulatory Visit (INDEPENDENT_AMBULATORY_CARE_PROVIDER_SITE_OTHER): Payer: Medicare Other

## 2021-06-14 ENCOUNTER — Other Ambulatory Visit: Payer: Self-pay | Admitting: Cardiovascular Disease

## 2021-06-14 DIAGNOSIS — I10 Essential (primary) hypertension: Secondary | ICD-10-CM

## 2021-06-14 DIAGNOSIS — Z0000 Encounter for general adult medical examination without abnormal findings: Secondary | ICD-10-CM | POA: Diagnosis not present

## 2021-06-14 NOTE — Patient Instructions (Signed)
[name] , Thank you for taking time to come for your Medicare Wellness Visit. I appreciate your ongoing commitment to your health goals. Please review the following plan we discussed and let me know if I can assist you in the future.   Screening recommendations/referrals: Colonoscopy: pt stated he completed cologuard 6 month ago  Recommended yearly ophthalmology/optometry visit for glaucoma screening and checkup Recommended yearly dental visit for hygiene and checkup  Vaccinations: Influenza vaccine: Due 07/05/21 Pneumococcal vaccine: Postponed 06/01/22 Tdap vaccine: Declined and discussed Shingles vaccine: Shingrix discussed. Please contact your pharmacy for coverage information.  Covid-19: Declined and discussed  Advanced directives: copies in chart  Conditions/risks identified: stay healthy  Next appointment: Follow up in one year for your annual wellness visit.   Preventive Care 50 Years and Older, Male Preventive care refers to lifestyle choices and visits with your health care provider that can promote health and wellness. What does preventive care include? A yearly physical exam. This is also called an annual well check. Dental exams once or twice a year. Routine eye exams. Ask your health care provider how often you should have your eyes checked. Personal lifestyle choices, including: Daily care of your teeth and gums. Regular physical activity. Eating a healthy diet. Avoiding tobacco and drug use. Limiting alcohol use. Practicing safe sex. Taking low doses of aspirin every day. Taking vitamin and mineral supplements as recommended by your health care provider. What happens during an annual well check? The services and screenings done by your health care provider during your annual well check will depend on your age, overall health, lifestyle risk factors, and family history of disease. Counseling  Your health care provider may ask you questions about your: Alcohol  use. Tobacco use. Drug use. Emotional well-being. Home and relationship well-being. Sexual activity. Eating habits. History of falls. Memory and ability to understand (cognition). Work and work Astronomer. Screening  You may have the following tests or measurements: Height, weight, and BMI. Blood pressure. Lipid and cholesterol levels. These may be checked every 5 years, or more frequently if you are over 78 years old. Skin check. Lung cancer screening. You may have this screening every year starting at age 41 if you have a 30-pack-year history of smoking and currently smoke or have quit within the past 15 years. Fecal occult blood test (FOBT) of the stool. You may have this test every year starting at age 55. Flexible sigmoidoscopy or colonoscopy. You may have a sigmoidoscopy every 5 years or a colonoscopy every 10 years starting at age 38. Prostate cancer screening. Recommendations will vary depending on your family history and other risks. Hepatitis C blood test. Hepatitis B blood test. Sexually transmitted disease (STD) testing. Diabetes screening. This is done by checking your blood sugar (glucose) after you have not eaten for a while (fasting). You may have this done every 1-3 years. Abdominal aortic aneurysm (AAA) screening. You may need this if you are a current or former smoker. Osteoporosis. You may be screened starting at age 65 if you are at high risk. Talk with your health care provider about your test results, treatment options, and if necessary, the need for more tests. Vaccines  Your health care provider may recommend certain vaccines, such as: Influenza vaccine. This is recommended every year. Tetanus, diphtheria, and acellular pertussis (Tdap, Td) vaccine. You may need a Td booster every 10 years. Zoster vaccine. You may need this after age 34. Pneumococcal 13-valent conjugate (PCV13) vaccine. One dose is recommended after age  65. Pneumococcal polysaccharide  (PPSV23) vaccine. One dose is recommended after age 34. Talk to your health care provider about which screenings and vaccines you need and how often you need them. This information is not intended to replace advice given to you by your health care provider. Make sure you discuss any questions you have with your health care provider. Document Released: 12/18/2015 Document Revised: 08/10/2016 Document Reviewed: 09/22/2015 Elsevier Interactive Patient Education  2017 [place] Prevention in the Home Falls can cause injuries. They can happen to people of all ages. There are many things you can do to make your home safe and to help prevent falls. What can I do on the outside of my home? Regularly fix the edges of walkways and driveways and fix any cracks. Remove anything that might make you trip as you walk through a door, such as a raised step or threshold. Trim any bushes or trees on the path to your home. Use bright outdoor lighting. Clear any walking paths of anything that might make someone trip, such as rocks or tools. Regularly check to see if handrails are loose or broken. Make sure that both sides of any steps have handrails. Any raised decks and porches should have guardrails on the edges. Have any leaves, snow, or ice cleared regularly. Use sand or salt on walking paths during winter. Clean up any spills in your garage right away. This includes oil or grease spills. What can I do in the bathroom? Use night lights. Install grab bars by the toilet and in the tub and shower. Do not use towel bars as grab bars. Use non-skid mats or decals in the tub or shower. If you need to sit down in the shower, use a plastic, non-slip stool. Keep the floor dry. Clean up any water that spills on the floor as soon as it happens. Remove soap buildup in the tub or shower regularly. Attach bath mats securely with double-sided non-slip rug tape. Do not have throw rugs and other things on the  floor that can make you trip. What can I do in the bedroom? Use night lights. Make sure that you have a light by your bed that is easy to reach. Do not use any sheets or blankets that are too big for your bed. They should not hang down onto the floor. Have a firm chair that has side arms. You can use this for support while you get dressed. Do not have throw rugs and other things on the floor that can make you trip. What can I do in the kitchen? Clean up any spills right away. Avoid walking on wet floors. Keep items that you use a lot in easy-to-reach places. If you need to reach something above you, use a strong step stool that has a grab bar. Keep electrical cords out of the way. Do not use floor polish or wax that makes floors slippery. If you must use wax, use non-skid floor wax. Do not have throw rugs and other things on the floor that can make you trip. What can I do with my stairs? Do not leave any items on the stairs. Make sure that there are handrails on both sides of the stairs and use them. Fix handrails that are broken or loose. Make sure that handrails are as long as the stairways. Check any carpeting to make sure that it is firmly attached to the stairs. Fix any carpet that is loose or worn. Avoid having throw rugs at  the top or bottom of the stairs. If you do have throw rugs, attach them to the floor with carpet tape. Make sure that you have a light switch at the top of the stairs and the bottom of the stairs. If you do not have them, ask someone to add them for you. What else can I do to help prevent falls? Wear shoes that: Do not have high heels. Have rubber bottoms. Are comfortable and fit you well. Are closed at the toe. Do not wear sandals. If you use a stepladder: Make sure that it is fully opened. Do not climb a closed stepladder. Make sure that both sides of the stepladder are locked into place. Ask someone to hold it for you, if possible. Clearly mark and make  sure that you can see: Any grab bars or handrails. First and last steps. Where the edge of each step is. Use tools that help you move around (mobility aids) if they are needed. These include: Canes. Walkers. Scooters. Crutches. Turn on the lights when you go into a dark area. Replace any light bulbs as soon as they burn out. Set up your furniture so you have a clear path. Avoid moving your furniture around. If any of your floors are uneven, fix them. If there are any pets around you, be aware of where they are. Review your medicines with your doctor. Some medicines can make you feel dizzy. This can increase your chance of falling. Ask your doctor what other things that you can do to help prevent falls. This information is not intended to replace advice given to you by your health care provider. Make sure you discuss any questions you have with your health care provider. Document Released: 09/17/2009 Document Revised: 04/28/2016 Document Reviewed: 12/26/2014 Elsevier Interactive Patient Education  2017 [employer].

## 2021-06-14 NOTE — Progress Notes (Signed)
Virtual Visit via Telephone Note  I connected with  Maurice Reed on 06/14/21 at  2:30 PM EDT by telephone and verified that I am speaking with the correct person using two identifiers.  Medicare Annual Wellness visit completed telephonically due to Covid-19 pandemic.   Persons participating in this call: This Health Coach and this patient.   Location: Patient: Home Provider: Office   I discussed the limitations, risks, security and privacy concerns of performing an evaluation and management service by telephone and the availability of in person appointments. The patient expressed understanding and agreed to proceed.  Unable to perform video visit due to video visit attempted and failed and/or patient does not have video capability.   Some vital signs may be absent or patient reported.   Willette Brace, LPN   Subjective:   Maurice Reed is a 80 y.o. male who presents for Medicare Annual/Subsequent preventive examination.  Review of Systems     Cardiac Risk Factors include: advanced age (>44mn, >>37women);hypertension;dyslipidemia;obesity (BMI >30kg/m2);male gender     Objective:    There were no vitals filed for this visit. There is no height or weight on file to calculate BMI.  Advanced Directives 06/14/2021 08/20/2020 04/03/2020 04/03/2019 01/22/2019  Does Patient Have a Medical Advance Directive? Yes Yes Yes No No  Type of Advance Directive HMays ChapelDNR (pink MOST or yellow form) HMerwinLiving will - -  Does patient want to make changes to medical advance directive? - - No - Patient declined - -  Copy of [place] Chart? - - No - copy requested - -  Would patient like information on creating a medical advance directive? - - - Yes (ED - Information included in AVS) No - Patient declined    Current Medications (verified) Outpatient Encounter Medications as of 06/14/2021  Medication Sig    Apremilast (OTEZLA PO) Take 30 mg by mouth 2 (two) times daily.   Ascorbic Acid (VITAMIN C) 500 MG CAPS Take 1 capsule by mouth daily.   Atropine Sulfate-NaCl 0.01-0.9 % SOLN Apply 1 drop to eye at bedtime.    Cholecalciferol (VITAMIN D3) 3000 units TABS Take 1 tablet by mouth daily.   clobetasol (TEMOVATE) 0.05 % external solution APPLY TO AFFECTED AREA(S)  ON SCALP TOPICALLY ONE TO  TWO TIMES A DAY AS NEEDED   clobetasol (TEMOVATE) 0.05 % external solution Apply 1 application topically 2 (two) times daily.   desonide (DESOWEN) 0.05 % lotion Apply 1 application topically as needed.   diltiazem (CARDIZEM) 30 MG tablet Take 1 tablet every 4 hours AS NEEDED for heart rate >100   docusate sodium (COLACE) 100 MG capsule Take 100 mg by mouth daily.    doxycycline (VIBRA-TABS) 100 MG tablet TAKE 1 TABLET BY MOUTH  TWICE DAILY   ELIQUIS 5 MG TABS tablet TAKE 1 TABLET BY MOUTH  TWICE DAILY   erythromycin ophthalmic ointment APP THIN LAYER IN OS BID   ipratropium-albuterol (DUONEB) 0.5-2.5 (3) MG/3ML SOLN Take 3 mLs by nebulization every 6 (six) hours as needed.   ketorolac (ACULAR) 0.5 % ophthalmic solution Place 1 drop into the right eye 4 (four) times daily.   magnesium oxide (MAG-OX) 400 MG tablet TAKE 1 TABLET BY MOUTH TWICE A DAY   metoprolol succinate (TOPROL-XL) 25 MG 24 hr tablet TAKE 1 TABLET BY MOUTH  DAILY   mometasone (NASONEX) 50 MCG/ACT nasal spray Place 2 sprays into the nose  daily. (Patient taking differently: Place 2 sprays into the nose as needed.)   nystatin ointment (MYCOSTATIN) Apply 1 application topically 2 (two) times daily.   oxyCODONE (OXY IR/ROXICODONE) 5 MG immediate release tablet Take 5 mg by mouth 3 (three) times daily as needed. for pain   potassium chloride (MICRO-K) 10 MEQ CR capsule TAKE 1 CAPSULE BY MOUTH  TWICE DAILY   prednisoLONE acetate (PRED FORTE) 1 % ophthalmic suspension Place 1 drop into the left eye 2 (two) times daily.    tadalafil (CIALIS) 20 MG tablet  Take 0.5-1 tablets (10-20 mg total) by mouth every other day as needed for erectile dysfunction.   tamsulosin (FLOMAX) 0.4 MG CAPS capsule TAKE 1 CAPSULE BY MOUTH AT  BEDTIME   temazepam (RESTORIL) 15 MG capsule TAKE 1 CAPSULE (15 MG TOTAL) BY MOUTH AT BEDTIME AS NEEDED FOR SLEEP.   torsemide (DEMADEX) 20 MG tablet TAKE 2 TABLETS BY MOUTH IN THE AM AND 1 TAB IN THE EVENING   TRIAMCINOLONE PO Uses ointment as needed   NARCAN 4 MG/0.1ML LIQD nasal spray kit INSTILL ONE SPRAY PRN FOR ACCIDENTAL OVERDOSE (Patient not taking: Reported on 06/14/2021)   No facility-administered encounter medications on file as of 06/14/2021.    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   Diverticula, bladder    Erectile dysfunction    GERD (gastroesophageal reflux disease)    Kidney stones    Obesity    Pulmonary embolism ([facility])    Past Surgical History:  Procedure Laterality Date   APPENDECTOMY  1963   LITHOTRIPSY  1987   NOSE SURGERY  2003   TONSILLECTOMY AND ADENOIDECTOMY  1017   UMBILICAL HERNIA REPAIR  1984   Family History  Problem Relation Age of Onset   Arthritis Mother    Hearing loss Mother    Hypertension Mother    Heart disease Mother    Stroke Mother    Arthritis Father    Hearing loss Father    Heart disease Father    Hypertension Father    Heart attack Father    Kidney disease Father    Stroke Father    Social History   Socioeconomic History   Marital status: Married    Spouse name: Not on file   Number of children: Not on file   Years of education: Not on file   Highest education level: Not on file  Occupational History   Not on file  Tobacco Use   Smoking status: Former    Packs/day: 3.00    Years: 25.00    Pack years: 75.00    Types: Cigarettes    Quit date: 11/28/1984    Years since quitting: 36.5   Smokeless tobacco: Never   Vaping Use   Vaping Use: Never used  Substance and Sexual Activity   Alcohol use: Yes    Alcohol/week: 1.0 standard drink    Types: 1 Glasses of wine per week    Comment: at times   Drug use: Not Currently    Types: Marijuana    Comment: Gummies    Sexual activity: Yes  Other Topics Concern   Not on file  Social History Narrative   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74)   Married to [employer] Len Blalock patient)  7 children   Currently lives with son in [place], [place]; all their things are in storage; plan to travel and stay with kids but keep their home base in the [place] area   Right Handed   Social Determinants of Health   Financial Resource Strain: Low Risk    Difficulty of Paying Living Expenses: Not very hard  Food Insecurity: No Food Insecurity   Worried About Charity fundraiser in the Last Year: Never true   [place] in the Last Year: Never true  Transportation Needs: No Transportation Needs   Lack of Transportation (Medical): No   Lack of Transportation (Non-Medical): No  Physical Activity: Insufficiently Active   Days of Exercise per Week: 2 days   Minutes of Exercise per Session: 10 min  Stress: No Stress Concern Present   Feeling of Stress : Not at all  Social Connections: Moderately Isolated   Frequency of Communication with Friends and Family: More than three times a week   Frequency of Social Gatherings with Friends and Family: Once a week   Attends Religious Services: Never   Marine scientist or Organizations: No   Attends Music therapist: Never   Marital Status: Married    Tobacco Counseling Counseling given: Not Answered   Clinical Intake:  Pre-visit preparation completed: Yes  Pain : No/denies pain     BMI - recorded: 39.29 Nutritional Status: BMI > 30  Obese Nutritional Risks: None Diabetes: No  How often do you need to have someone help you when you read  instructions, pamphlets, or other written materials from your doctor or pharmacy?: 1 - Never  Diabetic?No  Interpreter Needed?: No  Information entered by :: Charlott Rakes, LPN   Activities of Daily Living In your present state of health, do you have any difficulty performing the following activities: 06/14/2021  Hearing? Y  Comment HOH  Vision? N  Difficulty concentrating or making decisions? N  Walking or climbing stairs? Y  Comment difficulty with walker  Dressing or bathing? N  Doing errands, shopping? N  Preparing Food and eating ? N  Using the Toilet? N  In the past six months, have you accidently leaked urine? Y  Comment use pads  Do you have problems with loss of bowel control? N  Managing your Medications? N  Managing your Finances? N  Housekeeping or managing your Housekeeping? N  Some recent data might be hidden    Patient Care Team: Inda Coke, [place] as PCP - General (Physician Assistant) Lorretta Harp, MD as PCP - Cardiology (Cardiology) Rigoberto Noel, MD as Consulting Physician (Pulmonary Disease) Lorretta Harp, MD as Consulting Physician (Cardiology) Bordelon, Merry Lofty, MD as Consulting Physician (Ophthalmology) Jeanella Anton, NP as Nurse Practitioner (Pain Medicine) Alda Berthold, DO as Consulting Physician (Neurology) Jeanella Anton, NP as Nurse Practitioner (Nurse Practitioner)  Indicate any recent Medical Services you may have received from other than [facility] providers in the past year (date may be approximate).     Assessment:   This is a routine wellness examination for Roddy.  Hearing/Vision screen Hearing Screening - Comments:: Pt is HOH  Vision Screening - Comments:: Pt follows up with [facility] for annual eye exams   Dietary issues and exercise activities discussed: Current Exercise Habits: Home exercise routine, Type of exercise: Other - see comments, Time (Minutes): 15, Frequency (Times/Week): 2, Weekly Exercise  (Minutes/Week): 30   Goals Addressed  This Visit's Progress    Patient Stated       Stay healthy         Depression Screen PHQ 2/9 Scores 06/14/2021 04/03/2020 02/21/2020 04/03/2019 05/22/2018  PHQ - 2 Score 0 0 0 0 0  PHQ- 9 Score - - 0 - -    Fall Risk Fall Risk  06/14/2021 03/01/2021 08/20/2020 04/03/2020 04/03/2019  Falls in the past year? 0 0 1 0 0  Number falls in past yr: 0 0 1 0 -  Injury with Fall? 0 0 0 0 -  Risk for fall due to : Impaired vision;Impaired balance/gait - - Impaired mobility -  Follow up Falls prevention discussed - - Falls evaluation completed;Education provided;Falls prevention discussed -    FALL RISK PREVENTION PERTAINING TO THE HOME:  Any stairs in or around the home? Yes  If so, are there any without handrails? No  Home free of loose throw rugs in walkways, pet beds, electrical cords, etc? Yes  Adequate lighting in your home to reduce risk of falls? Yes   ASSISTIVE DEVICES UTILIZED TO PREVENT FALLS:  Life alert? No  Use of a cane, walker or w/c? Yes  Grab bars in the bathroom? No  Shower chair or bench in shower? Yes  Elevated toilet seat or a handicapped toilet? No   TIMED UP AND GO:  Was the test performed? No .     Cognitive Function:     6CIT Screen 06/14/2021 04/03/2020  What Year? 0 points 0 points  What month? 0 points 0 points  What time? 0 points 0 points  Count back from 20 0 points 0 points  Months in reverse 0 points 0 points  Repeat phrase 4 points 2 points  Total Score 4 2    Immunizations Immunization History  Administered Date(s) Administered   Fluad Quad(high Dose 65+) 08/23/2019, 09/08/2020   Influenza, High Dose Seasonal PF 09/21/2018   Influenza, Seasonal, Injecte, Preservative Fre 09/28/2017   Pneumococcal Polysaccharide-23 12/14/2015    TDAP status: Due, Education has been provided regarding the importance of this vaccine. Advised may receive this vaccine at local pharmacy or Health Dept.  Aware to provide a copy of the vaccination record if obtained from local pharmacy or Health Dept. Verbalized acceptance and understanding.  Flu Vaccine status: Up to date  Pneumococcal vaccine status: Due, Education has been provided regarding the importance of this vaccine. Advised may receive this vaccine at local pharmacy or Health Dept. Aware to provide a copy of the vaccination record if obtained from local pharmacy or Health Dept. Verbalized acceptance and understanding.  Covid-19 vaccine status: Declined, Education has been provided regarding the importance of this vaccine but patient still declined. Advised may receive this vaccine at local pharmacy or Health Dept.or vaccine clinic. Aware to provide a copy of the vaccination record if obtained from local pharmacy or Health Dept. Verbalized acceptance and understanding.  Qualifies for Shingles Vaccine? Yes   Zostavax completed No   Shingrix Completed?: No.    Education has been provided regarding the importance of this vaccine. Patient has been advised to call insurance company to determine out of pocket expense if they have not yet received this vaccine. Advised may also receive vaccine at local pharmacy or Health Dept. Verbalized acceptance and understanding.  Screening Tests Health Maintenance  Topic Date Due   COVID-19 Vaccine (1) 09/01/2021 (Originally 02/16/1946)   Zoster Vaccines- Shingrix (1 of 2) 09/14/2021 (Originally 02/17/1960)   PNA vac Low Risk Adult (  2 of 2 - PCV13) 06/01/2022 (Originally 12/13/2016)   TETANUS/TDAP  06/14/2022 (Originally 02/17/1960)   INFLUENZA VACCINE  07/05/2021   HPV VACCINES  Aged Out    Health Maintenance  There are no preventive care reminders to display for this patient.   Colorectal screening pt stated he does cologuard and had one 6 months ago unsure of yearly follow up    Additional Screening  Vision Screening: Recommended annual ophthalmology exams for early detection of glaucoma and  other disorders of the eye. Is the patient up to date with their annual eye exam?  Yes  Who is the provider or what is the name of the office in which the patient attends annual eye exams? [facility] If pt is not established with a provider, would they like to be referred to a provider to establish care? No .   Dental Screening: Recommended annual dental exams for proper oral hygiene  Community Resource Referral / Chronic Care Management: CRR required this visit?  No   CCM required this visit?  No      Plan:     I have personally reviewed and noted the following in the patient's chart:   Medical and social history Use of alcohol, tobacco or illicit drugs  Current medications and supplements including opioid prescriptions. Patient is currently taking opioid prescriptions. Information provided to patient regarding non-opioid alternatives. Patient advised to discuss non-opioid treatment plan with their provider. Functional ability and status Nutritional status Physical activity Advanced directives List of other physicians Hospitalizations, surgeries, and ER visits in previous 12 months Vitals Screenings to include cognitive, depression, and falls Referrals and appointments  In addition, I have reviewed and discussed with patient certain preventive protocols, quality metrics, and best practice recommendations. A written personalized care plan for preventive services as well as general preventive health recommendations were provided to patient.     Willette Brace, LPN   [id number]   Nurse Notes: None

## 2021-06-15 ENCOUNTER — Other Ambulatory Visit: Payer: Self-pay | Admitting: *Deleted

## 2021-06-15 ENCOUNTER — Other Ambulatory Visit: Payer: Self-pay | Admitting: Physician Assistant

## 2021-06-15 MED ORDER — POTASSIUM CHLORIDE ER 10 MEQ PO CPCR: 10.0000 meq | ORAL_CAPSULE | Freq: Two times a day (BID) | ORAL | 1 refills | Status: DC

## 2021-06-17 ENCOUNTER — Other Ambulatory Visit: Payer: Self-pay | Admitting: Physician Assistant

## 2021-06-20 ENCOUNTER — Other Ambulatory Visit: Payer: Self-pay | Admitting: Physician Assistant

## 2021-06-23 ENCOUNTER — Telehealth: Payer: Self-pay | Admitting: Family Medicine

## 2021-06-23 DIAGNOSIS — M5412 Radiculopathy, cervical region: Secondary | ICD-10-CM

## 2021-06-23 NOTE — Telephone Encounter (Signed)
Spoke with wife. Pain is returning. Plan repeat ESI

## 2021-06-24 ENCOUNTER — Other Ambulatory Visit: Payer: Self-pay | Admitting: Physician Assistant

## 2021-06-28 ENCOUNTER — Ambulatory Visit
Admission: RE | Admit: 2021-06-28 | Discharge: 2021-06-28 | Disposition: A | Discharge status: Home / Self Care | Payer: Medicare Other | Source: Ambulatory Visit | Attending: Family Medicine | Admitting: Family Medicine

## 2021-06-28 ENCOUNTER — Other Ambulatory Visit: Payer: Self-pay

## 2021-06-28 DIAGNOSIS — M5412 Radiculopathy, cervical region: Secondary | ICD-10-CM

## 2021-06-28 IMAGING — XA Imaging study
2 series · 2 of 2 positions shown · non-contrast
Comparison: none

CLINICAL DATA: Neck pain. Cervical radiculopathy. Displacement of
the cervical disc at C5-6 and C6-7.

[Series 1: ortho adipose · 1 of 1 slices shown (1 of 2)]
[im 1/1]
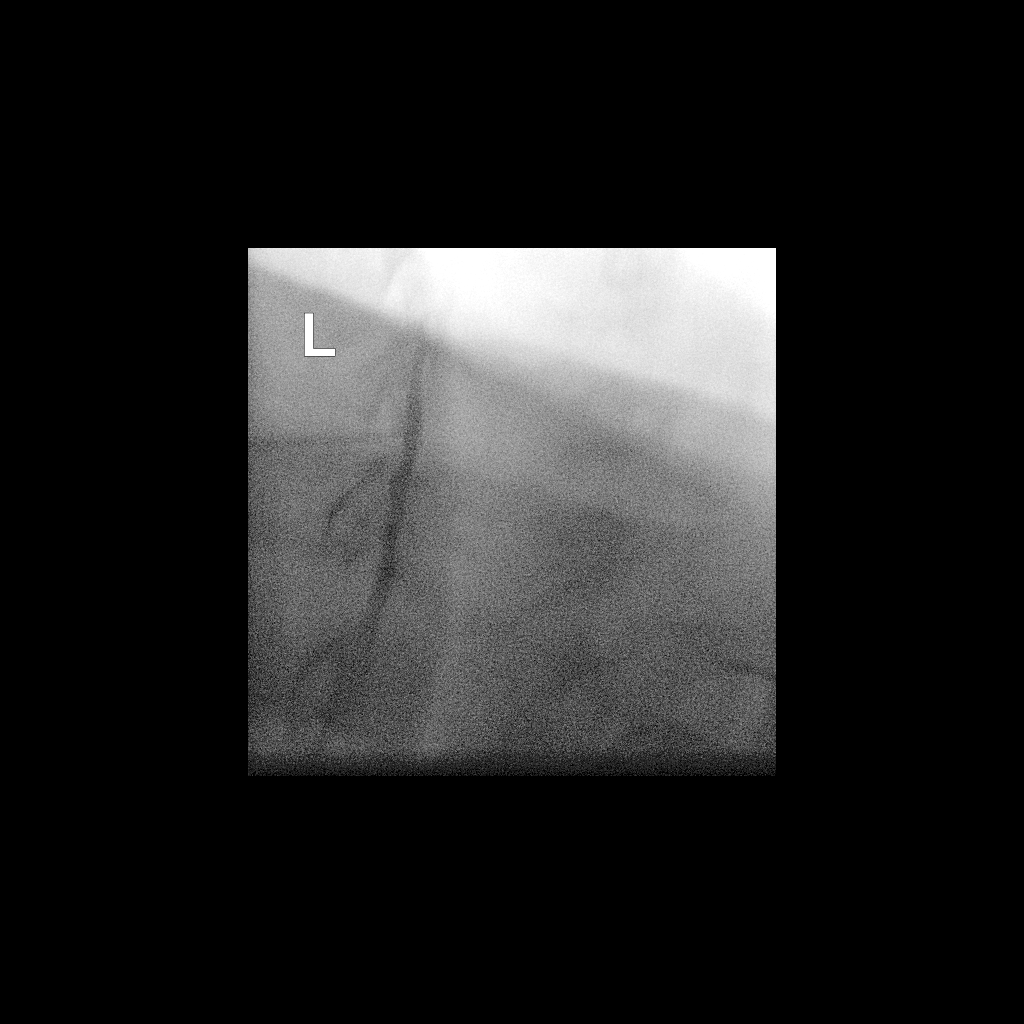

[Series 2: ortho adipose · 1 of 1 slices shown (2 of 2)]
[im 1/1]
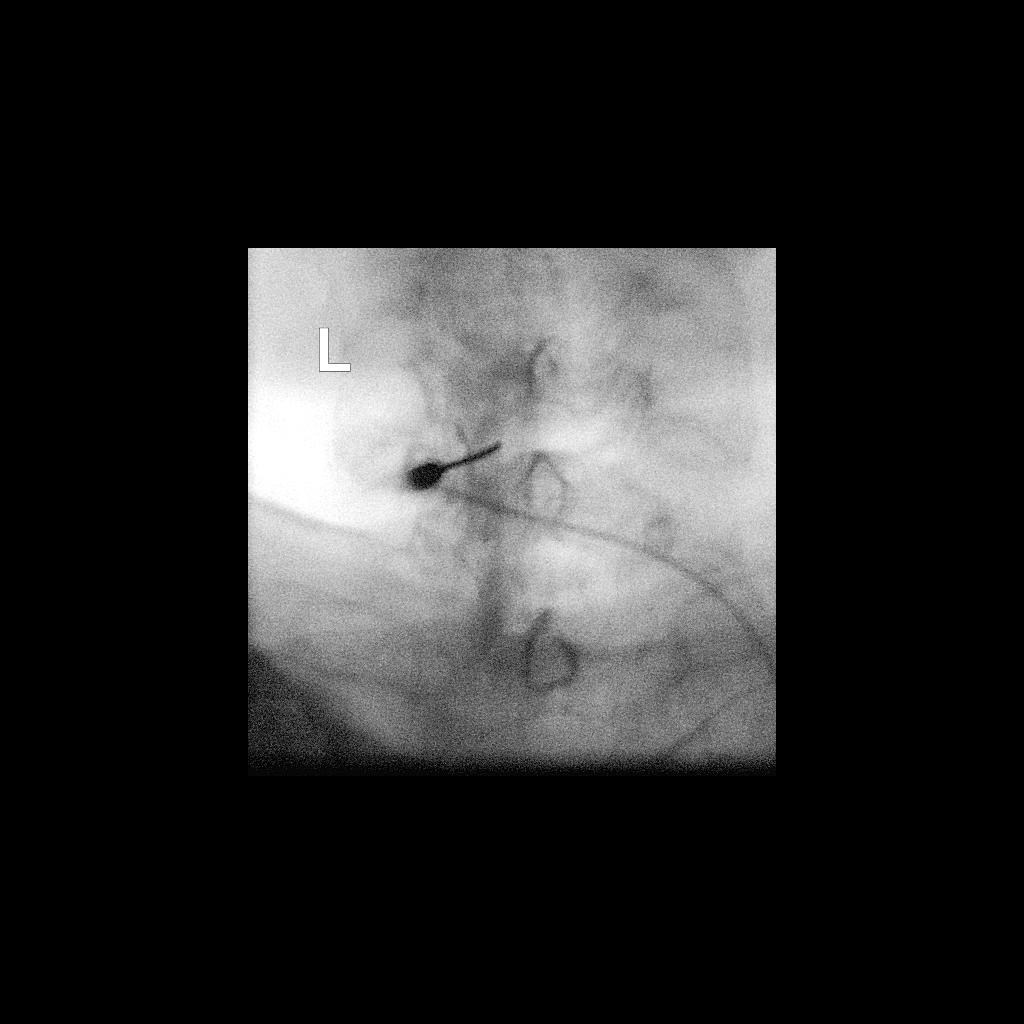

[2 of 2 positions shown; findings below may reference images not displayed]

FLUOROSCOPY TIME:  Radiation Exposure Index (as provided by the
fluoroscopic device): 48.16 uGy*m2

PROCEDURE:
CERVICAL EPIDURAL INJECTION

An interlaminar approach was performed on the left at C6-7. A 20
gauge epidural needle was advanced using loss-of-resistance
technique.

DIAGNOSTIC EPIDURAL INJECTION

Injection of Isovue-M 300 shows a good epidural pattern with spread
above and below the level of needle placement, primarily on the
left. No vascular opacification is seen. THERAPEUTIC

EPIDURAL INJECTION

1.5 ml of Kenalog 40 mixed with 1 ml of 1% Lidocaine and 2 ml of
normal saline were then instilled. The procedure was well-tolerated,
and the patient was discharged thirty minutes following the
injection in good condition.
IMPRESSION: Technically successful seconds epidural injection on the left at
C6-7.

## 2021-06-28 MED ORDER — IOPAMIDOL (ISOVUE-M 300) INJECTION 61%
1.0000 mL | Freq: Once | INTRAMUSCULAR | Status: AC
  Administered 2021-06-28: 1 mL via EPIDURAL

## 2021-06-28 MED ORDER — TRIAMCINOLONE ACETONIDE 40 MG/ML IJ SUSP (RADIOLOGY)
60.0000 mg | Freq: Once | INTRAMUSCULAR | Status: AC
  Administered 2021-06-28: 60 mg via EPIDURAL

## 2021-06-28 NOTE — Discharge Instructions (Signed)
Post Procedure Spinal Discharge Instruction Sheet  You may resume a regular diet and any medications that you routinely take (including pain medications) unless otherwise noted by MD.  No driving day of procedure.  Light activity throughout the rest of the day.  Do not do any strenuous work, exercise, bending or lifting.  The day following the procedure, you can resume normal physical activity but you should refrain from exercising or physical therapy for at least three days thereafter.  You may apply ice to the injection site, 20 minutes on, 20 minutes off, as needed. Do not apply ice directly to skin.    Common Side Effects:  Headaches- take your usual medications as directed by your physician.  Increase your fluid intake.  Caffeinated beverages may be helpful.  Lie flat in bed until your headache resolves.  Restlessness or inability to sleep- you may have trouble sleeping for the next few days.  Ask your referring physician if you need any medication for sleep.  Facial flushing or redness- should subside within a few days.  Increased pain- a temporary increase in pain a day or two following your procedure is not unusual.  Take your pain medication as prescribed by your referring physician.  Leg cramps  Please contact our office at [phone] for the following symptoms: Fever greater than 100 degrees. Headaches unresolved with medication after 2-3 days. Increased swelling, pain, or redness at injection site.   Thank you for visiting [facility] today.    YOU MAY RESUME YOUR ELIQUIS 24 HOURS AFTER INJECTION (YOU MAY RESUME ON 06/29/21)

## 2021-07-23 ENCOUNTER — Other Ambulatory Visit: Payer: Self-pay | Admitting: Physician Assistant

## 2021-07-23 DIAGNOSIS — I4821 Permanent atrial fibrillation: Secondary | ICD-10-CM

## 2021-07-25 ENCOUNTER — Other Ambulatory Visit: Payer: Self-pay | Admitting: Physician Assistant

## 2021-07-26 ENCOUNTER — Encounter: Payer: Self-pay | Admitting: Physician Assistant

## 2021-07-27 ENCOUNTER — Telehealth: Payer: Self-pay

## 2021-07-27 NOTE — Telephone Encounter (Signed)
See MyChart message

## 2021-07-27 NOTE — Telephone Encounter (Signed)
Patient's wife called in stating that she needs a call back from Lupita Leash concerning a medication that patient wants to try in regards to his neuropathy. Asked for more information, but [name] would not say anything other than she needed to speak to Lupita Leash.

## 2021-07-27 NOTE — Telephone Encounter (Signed)
Pt's wife [name] called back told her [name] said because he is getting medication for his pain from a pain management center, you will need to contact them regarding this medication. Told her Lyrica is now a controlled substance. [name] verbalized understanding and will let pain management know and get Rx from them.

## 2021-07-27 NOTE — Telephone Encounter (Signed)
Left message on voicemail to call office.  

## 2021-08-02 ENCOUNTER — Telehealth: Payer: Self-pay | Admitting: Hospice

## 2021-08-02 ENCOUNTER — Telehealth: Payer: Self-pay | Admitting: Physician Assistant

## 2021-08-02 NOTE — Progress Notes (Signed)
  Care Management  Note   08/02/2021 Name: Maurice Reed MRN: [id number] DOB: 1941-11-06  Maurice Reed is a 80 y.o. year old male who is a primary care patient of Inda Coke, [place]. The care management team was consulted for assistance with chronic disease management and care coordination needs.   [name] was given information about Care Management services today including:  CCM service includes personalized support from designated clinical staff supervised by the physician, including individualized plan of care and coordination with other care providers 24/7 contact phone numbers for assistance for urgent and routine care needs. Service will only be billed when office clinical staff spend 20 minutes or more in a month to coordinate care. Only one practitioner may furnish and bill the service in a calendar month. The patient may stop CCM services at amy time (effective at the end of the month) by phone call to the office staff. The patient will be responsible for cost sharing (co-pay) or up to 20% of the service fee (after annual deductible is met)  Patient agreed to services and verbal consent obtained.  Follow up plan:   An initial telephone outreach has been scheduled for: 09/07/21 $[email or web address]$   Secundino Ginger [employer]

## 2021-08-02 NOTE — Telephone Encounter (Signed)
NP called patient/spouse as scheduled and left a voicemail with call back number.

## 2021-08-28 ENCOUNTER — Other Ambulatory Visit: Payer: Self-pay | Admitting: Physician Assistant

## 2021-08-28 DIAGNOSIS — K219 Gastro-esophageal reflux disease without esophagitis: Secondary | ICD-10-CM

## 2021-08-30 ENCOUNTER — Ambulatory Visit: Payer: Medicare Other | Admitting: Family Medicine

## 2021-08-30 ENCOUNTER — Other Ambulatory Visit: Payer: Self-pay | Admitting: Family Medicine

## 2021-08-30 ENCOUNTER — Other Ambulatory Visit: Payer: Self-pay

## 2021-08-30 VITALS — BP 102/62 | HR 81

## 2021-08-30 DIAGNOSIS — M5412 Radiculopathy, cervical region: Secondary | ICD-10-CM | POA: Diagnosis not present

## 2021-08-30 DIAGNOSIS — M25512 Pain in left shoulder: Secondary | ICD-10-CM | POA: Diagnosis not present

## 2021-08-30 DIAGNOSIS — G8929 Other chronic pain: Secondary | ICD-10-CM

## 2021-08-30 NOTE — Patient Instructions (Signed)
Good to see you today.  I've ordered a 3rd cervical epidural.  Pleas call [facility] at [phone] to schedule your epidural.  Follow-up as needed.

## 2021-08-30 NOTE — Progress Notes (Signed)
I, Christoper Fabian, LAT, ATC, am serving as scribe for Dr. [name].   Maurice Reed is a 80 y.o. 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 Pt was last seen by Dr. Denyse Amass on 03/08/21 and was sent for an ESI that was performed on 03/22/21 and repeated on 06/28/21. Today, pt reports that he's had increased pain in his L arm over the past few weeks that is particularly worse at night. He describes his pain as an electrical current running down the L arm to his L hand and fingers.  His cervical ESIs have helped w/ his pain. He notes massage has been helpful.  Dx imaging: 03/08/21 L & R shoulder XR 07/25/20 C-spine MRI  Pertinent review of systems: No fevers or chills  Relevant historical information: Multiple medical comorbidities.  Lung disease.   Exam:  BP 102/62 (BP Location: Left Arm, Patient Position: Sitting, Cuff Size: Large)   Pulse 81   SpO2 98%  General: Obese elderly male seated in a wheelchair with oxygen.  MSK: C-spine nontender midline decreased cervical motion.    Lab and Radiology Results EXAM: MRI CERVICAL SPINE WITHOUT CONTRAST   TECHNIQUE: Multiplanar, multisequence MR imaging of the cervical spine was performed. No intravenous contrast was administered.   COMPARISON:  None.   FINDINGS: Alignment: Mild straightening of the normal cervical lordosis.   Vertebrae: No fracture or primary bone lesion.   Cord: No primary cord lesion.  See below regarding stenosis.   Posterior Fossa, vertebral arteries, paraspinal tissues: Negative   Disc levels:   Foramen magnum is widely patent.  C1-2 is negative.   C2-3: Minimal disc bulge. Mild facet osteoarthritis. No compressive stenosis.   C3-4: Endplate  osteophytes and bulging of the disc. Canal stenosis with AP diameter in the midline of 9.2 mm. No cord compression. Bilateral facet and uncovertebral osteophytes. Bilateral foraminal narrowing could affect either C4 nerve.   C4-5: Endplate osteophytes and mild bulging of the disc. Facet osteoarthritis on the right. No compressive canal stenosis. Mild foraminal narrowing right more than left but no definite neural compression.   C5-6: Spondylosis with endplate osteophytes and bulging of the disc. Narrowing of the canal. Effacement of the subarachnoid space. AP diameter in the midline 8 mm. Slight indentation of the cord. No abnormal cord signal. Mild bilateral foraminal stenosis.   C6-7: Endplate osteophytes and disc protrusion more prominent to the left of midline. Effacement of the subarachnoid space. AP diameter of the canal in the midline 7.6 mm. Indentation of the ventral cord. No abnormal cord signal. No foraminal stenosis.   C7-T1: Normal interspace.   IMPRESSION: No primary cord pathology evident in the cervical region.   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 Effacement of the subarachnoid space with slight indentation of cord, but no abnormal cord signal. Potential for neural foraminal compression at multiple levels as outlined above.     Electronically Signed   By: Paulina Fusi M.D.   On: 07/25/2020 15:00   I, [name], personally (independently) visualized and performed the interpretation of the images attached in this note.      Assessment and Plan:  80 y.o. male with left arm pain due to cervical radiculopathy.  Patient is doing reasonably well with intermittent epidural steroid injections.  Last injection was July 25 and before that April 18.  Patient is not a candidate for surgery unless for extreme life-saving needs for his neck issues due to his  other medical comorbidities.  Plan to continue conservative management with intermittent epidural steroid injections.  Recommend also massage and heat.  He already is on Lyrica per his pain management physician. Recheck back as needed.  PDMP not reviewed this encounter. Orders Placed This Encounter  Procedures   DG INJECT DIAG/THERA/INC NEEDLE/CATH/PLC EPI/LUMB/SAC W/IMG    3rd ESI at L C6-7; technique per radiology CERV EPI #3 [facility]-no auth req per online//js  PACS (07/25/20) 270 LBS *ELIQUIS* STOP 2 DAYS (PT TAKE 1 TWICE A DAY)-PT AWARE *SCREENED* 08/30/21 FAX CLARENCE LETTER/CLC    Standing Status:   Future    Standing Expiration Date:   08/30/2022    Order Specific Question:   Reason for Exam (SYMPTOM  OR DIAGNOSIS REQUIRED)    Answer:   cervical radiculoathy    Order Specific Question:   Preferred Imaging Location?    Answer:   [id number]   No orders of the defined types were placed in this encounter.    Discussed warning signs or symptoms. Please see discharge instructions. Patient expresses understanding.   The above documentation has been reviewed and is accurate and complete [name], M.D.

## 2021-09-01 ENCOUNTER — Telehealth: Payer: Self-pay

## 2021-09-01 NOTE — Telephone Encounter (Signed)
Spoke with pt's wife [name] (ok per [facility]) regarding BM pt assistance form was completed and will be left at the front desk for her to pick at her convenience. Brenda verbalizes understanding.

## 2021-09-05 ENCOUNTER — Other Ambulatory Visit: Payer: Self-pay | Admitting: Physician Assistant

## 2021-09-06 ENCOUNTER — Telehealth: Payer: Self-pay | Admitting: Family Medicine

## 2021-09-06 NOTE — Telephone Encounter (Signed)
Patient's wife called following up on the epidural order. She said that this was ordered through [facility]. It took multiple calls to their office for someone to finally call her back. The soonest they could get him scheduled is next week (due to the doctor they have there and his schedule). She does not think he can wait this long. He is in a lot of pain. Is there anywhere else this order could be sent so that he can have this done sooner?  Please advise.

## 2021-09-06 NOTE — Telephone Encounter (Signed)
There are typically the fastest people around to get these orders done.  I am not optimistic about getting you in with a new group sooner than [facility] will.

## 2021-09-06 NOTE — Telephone Encounter (Signed)
Spoke to patient's wife and gave Dr Zollie Pee response. She is having to go out of town soon and is working on setting up people to help care for him while she is gone and see who can take him to the appointment for the Epidural (possibly next Tuesday).  He is still having trouble sleeping due to the pain. Is there any other recommendations that you might have to help him until he is able to get the epidural?

## 2021-09-07 ENCOUNTER — Ambulatory Visit (INDEPENDENT_AMBULATORY_CARE_PROVIDER_SITE_OTHER): Payer: Medicare Other | Admitting: Pharmacist

## 2021-09-07 ENCOUNTER — Telehealth: Payer: Self-pay | Admitting: Pharmacist

## 2021-09-07 DIAGNOSIS — E782 Mixed hyperlipidemia: Secondary | ICD-10-CM

## 2021-09-07 DIAGNOSIS — G4700 Insomnia, unspecified: Secondary | ICD-10-CM

## 2021-09-07 DIAGNOSIS — I10 Essential (primary) hypertension: Secondary | ICD-10-CM

## 2021-09-07 DIAGNOSIS — I4821 Permanent atrial fibrillation: Secondary | ICD-10-CM

## 2021-09-07 NOTE — Progress Notes (Signed)
Chronic Care Management Pharmacy Note  09/07/2021 Name:  Maurice Reed MRN:  [id number] DOB:  1941/05/15  Summary: Initial visit with PharmD.  Patient having issues with cost on some medication.  In process of getting assistance for these meds.  Discussed timing and adherence of medication, patient's wife organizes pill box for him.  Recommendations/Changes made from today's visit: No changes at this time, will help with Eliquis PAP if needed Consider statin based on stroke risk/LDL  Plan: FU 6 months   Subjective: Maurice Reed is an 80 y.o. year old male who is a primary patient of Inda Coke, [place].  The CCM team was consulted for assistance with disease management and care coordination needs.    Engaged with patient by telephone for initial visit in response to provider referral for pharmacy case management and/or care coordination services.   Consent to Services:  The patient was given the following information about Chronic Care Management services today, agreed to services, and gave verbal consent: 1. CCM service includes personalized support from designated clinical staff supervised by the primary care provider, including individualized plan of care and coordination with other care providers 2. 24/7 contact phone numbers for assistance for urgent and routine care needs. 3. Service will only be billed when office clinical staff spend 20 minutes or more in a month to coordinate care. 4. Only one practitioner may furnish and bill the service in a calendar month. 5.The patient may stop CCM services at any time (effective at the end of the month) by phone call to the office staff. 6. The patient will be responsible for cost sharing (co-pay) of up to 20% of the service fee (after annual deductible is met). Patient agreed to services and consent obtained.  Patient Care Team: Inda Coke, [place] as PCP - General (Physician Assistant) Lorretta Harp, MD as PCP - Cardiology  (Cardiology) Rigoberto Noel, MD as Consulting Physician (Pulmonary Disease) Lorretta Harp, MD as Consulting Physician (Cardiology) Bordelon, Merry Lofty, MD as Consulting Physician (Ophthalmology) Jeanella Anton, NP as Nurse Practitioner (Pain Medicine) Alda Berthold, DO as Consulting Physician (Neurology) Jeanella Anton, NP as Nurse Practitioner (Nurse Practitioner) Edythe Clarity, [facility] as Pharmacist (Pharmacist)  Recent office visits:  03/17/2021 OV (PCP) Inda Coke, PA; cephalexin 500 mg three times daily for 7 days for finger problem.   Recent consult visits:  08/30/2021 OV (Sports Medicine) Gregor Hams, MD; no medication changes indicated.   06/08/2021 OV (cardiology) Fenton, Clint R, PA; Start diltiazem 30 mg PRN q 4 hours for HR >110 bpm. Would favor conservative rate control strategy given age and comorbidities.    06/01/2021 OV (pulmonology) Martyn Ehrich, NP; no medication changes indicated.   04/19/2021 OV (ophthalmology) [facility] [place], Michel Santee, MD; no medication changes indicated.   Hospital visits:  None in previous 6 months   Objective:  Lab Results  Component Value Date   CREATININE 0.86 03/01/2021   BUN 15 03/01/2021   GFR 82.05 03/01/2021   GFRNONAA 80 05/22/2020   GFRAA 92 05/22/2020   NA 140 03/01/2021   K 3.7 03/01/2021   CALCIUM 10.0 03/01/2021   CO2 37 (H) 03/01/2021   GLUCOSE 125 (H) 03/01/2021    Lab Results  Component Value Date/Time   HGBA1C 5.8 03/01/2021 03:06 PM   HGBA1C 5.9 (H) 07/22/2020 12:01 PM   GFR 82.05 03/01/2021 03:06 PM   GFR 100.88 11/22/2018 11:42 AM    Last diabetic Eye exam: No  results found for: HMDIABEYEEXA  Last diabetic Foot exam: No results found for: HMDIABFOOTEX   Lab Results  Component Value Date   CHOL 185 03/01/2021   HDL 48.80 03/01/2021   LDLCALC 107 (H) 03/01/2021   TRIG 146.0 03/01/2021   CHOLHDL 4 03/01/2021    Hepatic Function Latest Ref Rng & Units 03/01/2021  07/22/2020 12/17/2019  Total Protein 6.0 - 8.3 g/dL 6.4 6.7 6.7  Albumin 3.5 - 5.2 g/dL 3.6 - 3.6  AST 0 - 37 U/L '15 19 22  ' ALT 0 - 53 U/L '13 16 18  ' Alk Phosphatase 39 - 117 U/L 80 - 74  Total Bilirubin 0.2 - 1.2 mg/dL 0.4 0.6 0.5  Bilirubin, Direct 0.00 - 0.40 mg/dL - - -    Lab Results  Component Value Date/Time   TSH 2.77 07/22/2020 12:01 PM   TSH 2.85 12/13/2019 03:12 PM    CBC Latest Ref Rng & Units 03/01/2021 07/22/2020 12/17/2019  WBC 4.0 - 10.5 K/uL 6.3 6.8 6.1  Hemoglobin 13.0 - 17.0 g/dL 13.0 13.9 14.7  Hematocrit 39.0 - 52.0 % 38.4(L) 41.5 44.9  Platelets 150.0 - 400.0 K/uL 199.0 277 208    Lab Results  Component Value Date/Time   VD25OH 52.85 10/14/2019 12:00 AM    Clinical ASCVD: Yes  The ASCVD Risk score (Arnett DK, et al., 2019) failed to calculate for the following reasons:   The 2019 ASCVD risk score is only valid for ages 48 to 86    Depression screen PHQ 2/9 06/14/2021 04/03/2020 02/21/2020  Decreased Interest 0 0 0  Down, Depressed, Hopeless 0 0 0  PHQ - 2 Score 0 0 0  Altered sleeping - - 0  Tired, decreased energy - - 0  Change in appetite - - 0  Feeling bad or failure about yourself  - - 0  Trouble concentrating - - 0  Moving slowly or fidgety/restless - - 0  Suicidal thoughts - - 0  PHQ-9 Score - - 0     Social History   Tobacco Use  Smoking Status Former   Packs/day: 3.00   Years: 25.00   Pack years: 75.00   Types: Cigarettes   Quit date: 11/28/1984   Years since quitting: 36.8  Smokeless Tobacco Never   BP Readings from Last 3 Encounters:  08/30/21 102/62  06/28/21 (!) 160/76  06/08/21 (!) 120/56   Pulse Readings from Last 3 Encounters:  08/30/21 81  06/28/21 62  06/08/21 97   Wt Readings from Last 3 Encounters:  06/08/21 273 lb 12.8 oz (124.2 kg)  06/01/21 270 lb 6.4 oz (122.7 kg)  03/17/21 266 lb (120.7 kg)   BMI Readings from Last 3 Encounters:  06/08/21 39.29 kg/m  06/01/21 38.80 kg/m  03/17/21 38.17 kg/m     Assessment/Interventions: Review of patient past medical history, allergies, medications, health status, including review of consultants reports, laboratory and other test data, was performed as part of comprehensive evaluation and provision of chronic care management services.   SDOH:  (Social Determinants of Health) assessments and interventions performed: Yes  Financial Resource Strain: Low Risk    Difficulty of Paying Living Expenses: Not very hard    SDOH Screenings   Alcohol Screen: Not on file  Depression (PHQ2-9): Low Risk    PHQ-2 Score: 0  Financial Resource Strain: Low Risk    Difficulty of Paying Living Expenses: Not very hard  Food Insecurity: No Food Insecurity   Worried About Running Out of Food in the Last  Year: Never true   Ran Out of Food in the Last Year: Never true  Housing: Low Risk    Last Housing Risk Score: 0  Physical Activity: Insufficiently Active   Days of Exercise per Week: 2 days   Minutes of Exercise per Session: 10 min  Social Connections: Moderately Isolated   Frequency of Communication with Friends and Family: More than three times a week   Frequency of Social Gatherings with Friends and Family: Once a week   Attends Religious Services: Never   Marine scientist or Organizations: No   Attends Music therapist: Never   Marital Status: Married  Stress: No Stress Concern Present   Feeling of Stress : Not at all  Tobacco Use: Medium Risk   Smoking Tobacco Use: Former   Smokeless Tobacco Use: Never  Transportation Needs: No Data processing manager (Medical): No   Lack of Transportation (Non-Medical): No    CCM Care Plan  Allergies  Allergen Reactions   Other Itching    Cats: watery eyes, itching, sneezing    Medications Reviewed Today     Reviewed by Edythe Clarity, [facility] (Pharmacist) on 09/07/21 at 1521  Med List Status: <None>   Medication Order Taking? Sig Documenting Provider Last Dose  Status Informant  Apremilast (OTEZLA PO) [id number] Yes Take 30 mg by mouth 2 (two) times daily. [provider] Taking Active   Ascorbic Acid (VITAMIN C) 500 MG CAPS [id number] Yes Take 1 capsule by mouth daily. [provider] Taking Active   Atropine Sulfate-NaCl 0.01-0.9 % SOLN [id number] Yes Apply 1 drop to eye at bedtime.  [provider] Taking Active   Cholecalciferol (VITAMIN D3) 3000 units TABS [id number] Yes Take 1 tablet by mouth daily. [provider] Taking Active   clobetasol (TEMOVATE) 0.05 % external solution [id number] Yes Apply 1 application topically 2 (two) times daily. Inda Coke, [place] Taking Active   clobetasol (TEMOVATE) 0.05 % external solution [id number] Yes APPLY TO AFFECTED AREA(S)  TOPICALLY ON SCALP 1 TO 2  TIMES DAILY AS NEEDED Inda Coke, PA Taking Active   desonide (DESOWEN) 0.05 % lotion [id number] Yes Apply 1 application topically as needed. [provider] Taking Active   diltiazem (CARDIZEM) 30 MG tablet [id number] Yes Take 1 tablet every 4 hours AS NEEDED for heart rate >100 Fenton, Clint R, PA Taking Active   docusate sodium (COLACE) 100 MG capsule [id number] Yes Take 100 mg by mouth daily.  [provider] Taking Active   doxycycline (VIBRA-TABS) 100 MG tablet [id number] Yes TAKE 1 TABLET BY MOUTH  TWICE DAILY Vivi Barrack, MD Taking Active   ELIQUIS 5 MG TABS tablet [id number] Yes TAKE 1 TABLET BY MOUTH  TWICE DAILY Vivi Barrack, MD Taking Active   erythromycin ophthalmic ointment [id number] Yes APP THIN LAYER IN OS BID [provider] Taking Active   ipratropium-albuterol (DUONEB) 0.5-2.5 (3) MG/3ML SOLN [id number] Yes Take 3 mLs by nebulization every 6 (six) hours as needed. Rigoberto Noel, MD Taking Active   ketorolac Nancie Neas) 0.5 % ophthalmic solution [id number] Yes Place 1 drop into the right eye 4 (four) times daily. [provider] Taking Active   magnesium oxide (MAG-OX) 400 MG  tablet [id number] Yes TAKE 1 TABLET BY MOUTH TWICE A DAY [place], [place], [place] Taking Active   metoprolol succinate (TOPROL-XL) 25 MG 24 hr tablet [id number] Yes TAKE 1 TABLET BY MOUTH  DAILY Inda Coke, PA Taking Active  mometasone (NASONEX) 50 MCG/ACT nasal spray [id number] Yes Place 2 sprays into the nose daily.  Patient taking differently: Place 2 sprays into the nose as needed.   Rigoberto Noel, MD Taking Active   NARCAN 4 MG/0.1ML LIQD nasal spray kit [id number] Yes INSTILL ONE SPRAY PRN FOR ACCIDENTAL OVERDOSE [provider] Taking Active            Med Note Morton Amy, JENNIFER M   Wed Mar 06, 2019  1:00 PM) prn  nystatin ointment (MYCOSTATIN) [id number] Yes Apply 1 application topically 2 (two) times daily. Inda Coke, [place] Taking Active   oxyCODONE (OXY IR/ROXICODONE) 5 MG immediate release tablet [id number] Yes Take 5 mg by mouth 3 (three) times daily as needed. for pain [provider] Taking Active   potassium chloride (MICRO-K) 10 MEQ CR capsule [id number] Yes TAKE 2 CAPSULES BY MOUTH IN THE AM AND 1 CAPSULE IN THE [place], [place], [place] Taking Active   prednisoLONE acetate (PRED FORTE) 1 % ophthalmic suspension [id number] Yes Place 1 drop into the left eye 2 (two) times daily.  [provider] Taking Active   pregabalin (LYRICA) 50 MG capsule [id number] Yes Take 50 mg by mouth 2 (two) times daily. Jeanella Anton, NP Taking Active   tadalafil (CIALIS) 20 MG tablet [id number] Yes Take 0.5-1 tablets (10-20 mg total) by mouth every other day as needed for erectile dysfunction. Inda Coke, [place] Taking Active   tamsulosin (FLOMAX) 0.4 MG CAPS capsule [id number] Yes TAKE 1 CAPSULE BY MOUTH AT  BEDTIME Inda Coke, [place] Taking Active   temazepam (RESTORIL) 15 MG capsule [id number] Yes TAKE 1 CAPSULE (15 MG TOTAL) BY MOUTH AT BEDTIME AS NEEDED FOR SLEEP. Vivi Barrack, MD Taking Active   torsemide (DEMADEX) 20 MG tablet [id number] Yes TAKE 2 TABLETS BY  MOUTH IN  THE MORNING AND 1 TABLET BY MOUTH IN THE Maye Hides, MD Taking Active   TRIAMCINOLONE PO [id number] Yes Uses ointment as needed [provider] Taking Active             Patient Active Problem List   Diagnosis Date Noted   Secondary hypercoagulable state ([facility]) 06/08/2021   Hyperlipidemia 01/27/2021   Mild aortic stenosis 01/27/2021   Chronic anticoagulation 04/16/2020   Chronic constipation 03/03/2020   Bilateral lower abdominal discomfort 03/03/2020   Chronic respiratory failure with hypoxia ([place]) 12/23/2019   Shortness of breath 12/23/2019   Healthcare maintenance 08/16/2019   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   Sensorineural hearing loss (SNHL), 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 10/17/2018   Bilateral lower extremity edema 07/04/2018   Sclerosis of the skin 06/26/2018   ILD (interstitial lung disease) ([place]) 05/22/2018   Insomnia 05/22/2018   Erectile dysfunction 05/22/2018   Obesity    Kidney stones    Essential hypertension    GERD (gastroesophageal reflux disease)    Former smoker    Arthritis    Spondylosis without myelopathy or radiculopathy, 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   OSA treated with BiPAP 06/17/2013   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   Atrial fibrillation ([place]) 06/17/2013   Rosacea 06/17/2013   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   History of pulmonary embolus (PE) 12/05/1998    Immunization History  Administered Date(s) Administered   Fluad Quad(high Dose 65+) 08/23/2019, 09/08/2020   Influenza, High Dose Seasonal PF 09/21/2018   Influenza, Seasonal, Injecte, Preservative Fre 09/28/2017   Pneumococcal Polysaccharide-23 12/14/2015  Conditions to be addressed/monitored:  HTN, Afib, GERD, Insomnia, HLD  Care Plan : General Pharmacy (Adult)  Updates made by Edythe Clarity, RPH since 09/07/2021 12:00 AM      Problem: HTN, Afib, GERD, Insomnia, HLD   Priority: High  Onset Date: 09/07/2021     Long-Reed Goal: Patient-Specific Goal   Start Date: 09/07/2021  Expected End Date: 03/08/2022  This Visit's Progress: On track  Priority: High  Note:   Current Barriers:  Unable to independently afford treatment regimen Unable to achieve control of Afib   Pharmacist Clinical Goal(s):  Patient will verbalize ability to afford treatment regimen maintain control of BP  as evidenced by monitoring  through collaboration with PharmD and provider.   Interventions: 1:1 collaboration with Inda Coke, PA regarding development and update of comprehensive plan of care as evidenced by provider attestation and co-signature Inter-disciplinary care team collaboration (see longitudinal plan of care) Comprehensive medication review performed; medication list updated in electronic medical record  Hypertension (BP goal <140/90) -Controlled -Current treatment: Metoprolol XL 76m daily Diltiazem 368mprn for afib -Medications previously tried: none noted  -Current exercise habits: minimal, due to pain -Reports hypotensive/hypertensive symptoms - patient always feels a little dizzy -Educated on BP goals and benefits of medications for prevention of heart attack, stroke and kidney damage; Importance of home blood pressure monitoring; Symptoms of hypotension and importance of maintaining adequate hydration; -Counseled to monitor BP at home periodically, document, and provide log at future appointments -Recommended to continue current medication  Hyperlipidemia: (LDL goal < 70) -Not ideally controlled -Current treatment: None -Medications previously tried: Atorvastatin  -Patient with Afib and high stroke risk.  Currently not on a statin medication.  Not from cardiology states that due to age and lack of clear CAD they opted to hold for now.  LDL remains elevated.  Patient's chronic pain is another factor to  consider but him and his wife state he remains in Afib constantly.  May be worth considering addition of statin. -Educated on Cholesterol goals;  Benefits of statin for ASCVD risk reduction; Importance of limiting foods high in cholesterol; -Recommended to continue current medication  Atrial Fibrillation (Goal: prevent stroke and major bleeding) -Not ideally controlled -CHADSVASC: 4 -Current treatment: Rate control: Metoprolol XL 2562maily, diltiazem 28m9mn Anticoagulation: Eliquis 5mg 38m -Medications previously tried: none noted -Home BP and HR readings: controlled, but tend to fluctuate  -Counseled on increased risk of stroke due to Afib and benefits of anticoagulation for stroke prevention; bleeding risk associated with Eliquis and importance of self-monitoring for signs/symptoms of bleeding; -Recommended to continue current medication Assessed patient finances. Copay is currently extremely high as they hit the donut hole in March.  They are in the process of getting patient assistance from providers at [facility] have application just need to gather income information and return.  Insomnia (Goal: Improve sleep) -Controlled -Current treatment  Temazepam 15mg 56mMedications previously tried: none ntoed - Mentions that he does have to get up and use the bathroom some nights in the middle of the night, he is now used to this.  Reports sleep for the most part is adequate.  Medication is helping.  -Recommended to continue current medication  Patient Goals/Self-Care Activities Patient will:  - take medications as prescribed check blood pressure periodically, document, and provide at future appointments collaborate with provider on medication access solutions  Follow Up Plan: The care management team will reach out to the patient again over the next 180  days.         Medication Assistance: Application for Eliquis  medication assistance program. in process.  Anticipated  assistance start date unknown.  See plan of care for additional detail.  Compliance/Adherence/Medication fill history: Care Gaps: Flu Vaccine  Star-Rating Drugs: N/A  Patient's preferred pharmacy is:  [facility], [address] [place] [address] Phone: [phone] Fax: [phone]  Uses pill box? Yes Pt endorses 100% compliance  We discussed: Current pharmacy is preferred with insurance plan and patient is satisfied with pharmacy services Patient decided to: Continue current medication management strategy  Care Plan and Follow Up Patient Decision:  Patient agrees to Care Plan and Follow-up.  Plan: The care management team will reach out to the patient again over the next 180 days.  Beverly Milch, PharmD Clinical Pharmacist [phone]

## 2021-09-07 NOTE — Chronic Care Management (AMB) (Signed)
Chronic Care Management Pharmacy Assistant   Name: Maurice Reed  MRN: [id number] DOB: 01/16/1941   Reason for Encounter: Chart Review For Initial Visit With Clinical Pharmacist   Conditions to be addressed/monitored: HTN, Atrial Fibrillation, ILD, GERD, Insomnia, Erectile dysfunction  Primary concerns for visit include: HTN, Atrial Fibrillation  Recent office visits:  03/17/2021 OV (PCP) Inda Coke, PA; cephalexin 500 mg three times daily for 7 days for finger problem.  Recent consult visits:  08/30/2021 OV (Sports Medicine) Gregor Hams, MD; no medication changes indicated.  06/08/2021 OV (cardiology) Fenton, Clint R, PA; Start diltiazem 30 mg PRN q 4 hours for HR >110 bpm. Would favor conservative rate control strategy given age and comorbidities.   06/01/2021 OV (pulmonology) Martyn Ehrich, NP; no medication changes indicated.  04/19/2021 OV (ophthalmology) [facility], Bunch, Michel Santee, MD; no medication changes indicated.  Hospital visits:  None in previous 6 months  Medications: Outpatient Encounter Medications as of 09/07/2021  Medication Sig Note   Apremilast (OTEZLA PO) Take 30 mg by mouth 2 (two) times daily.    Ascorbic Acid (VITAMIN C) 500 MG CAPS Take 1 capsule by mouth daily.    Atropine Sulfate-NaCl 0.01-0.9 % SOLN Apply 1 drop to eye at bedtime.     Cholecalciferol (VITAMIN D3) 3000 units TABS Take 1 tablet by mouth daily.    clobetasol (TEMOVATE) 0.05 % external solution Apply 1 application topically 2 (two) times daily.    clobetasol (TEMOVATE) 0.05 % external solution APPLY TO AFFECTED AREA(S)  TOPICALLY ON SCALP 1 TO 2  TIMES DAILY AS NEEDED    desonide (DESOWEN) 0.05 % lotion Apply 1 application topically as needed.    diltiazem (CARDIZEM) 30 MG tablet Take 1 tablet every 4 hours AS NEEDED for heart rate >100    docusate sodium (COLACE) 100 MG capsule Take 100 mg by mouth daily.     doxycycline (VIBRA-TABS) 100 MG tablet TAKE 1 TABLET  BY MOUTH  TWICE DAILY    ELIQUIS 5 MG TABS tablet TAKE 1 TABLET BY MOUTH  TWICE DAILY    erythromycin ophthalmic ointment APP THIN LAYER IN OS BID    ipratropium-albuterol (DUONEB) 0.5-2.5 (3) MG/3ML SOLN Take 3 mLs by nebulization every 6 (six) hours as needed.    ketorolac (ACULAR) 0.5 % ophthalmic solution Place 1 drop into the right eye 4 (four) times daily.    magnesium oxide (MAG-OX) 400 MG tablet TAKE 1 TABLET BY MOUTH TWICE A DAY    metoprolol succinate (TOPROL-XL) 25 MG 24 hr tablet TAKE 1 TABLET BY MOUTH  DAILY    mometasone (NASONEX) 50 MCG/ACT nasal spray Place 2 sprays into the nose daily. (Patient taking differently: Place 2 sprays into the nose as needed.)    NARCAN 4 MG/0.1ML LIQD nasal spray kit INSTILL ONE SPRAY PRN FOR ACCIDENTAL OVERDOSE (Patient not taking: No sig reported) 03/06/2019: prn   nystatin ointment (MYCOSTATIN) Apply 1 application topically 2 (two) times daily.    oxyCODONE (OXY IR/ROXICODONE) 5 MG immediate release tablet Take 5 mg by mouth 3 (three) times daily as needed. for pain    potassium chloride (MICRO-K) 10 MEQ CR capsule TAKE 2 CAPSULES BY MOUTH IN THE AM AND 1 CAPSULE IN THE EVENING    prednisoLONE acetate (PRED FORTE) 1 % ophthalmic suspension Place 1 drop into the left eye 2 (two) times daily.     tadalafil (CIALIS) 20 MG tablet Take 0.5-1 tablets (10-20 mg total) by mouth every other  day as needed for erectile dysfunction.    tamsulosin (FLOMAX) 0.4 MG CAPS capsule TAKE 1 CAPSULE BY MOUTH AT  BEDTIME    temazepam (RESTORIL) 15 MG capsule TAKE 1 CAPSULE (15 MG TOTAL) BY MOUTH AT BEDTIME AS NEEDED FOR SLEEP.    torsemide (DEMADEX) 20 MG tablet TAKE 2 TABLETS BY MOUTH IN  THE MORNING AND 1 TABLET BY MOUTH IN THE EVENING    TRIAMCINOLONE PO Uses ointment as needed    No facility-administered encounter medications on file as of 09/07/2021.   Current Medications:  Clobetasol 0.05% solution last filled 07/03/2020 90 DS Magnesium Oxide 400 mg last filled  08/30/2021 90 DS Potassium chloride 10 meq last filled 07/26/2021 90 DS Eliquis 5 mg last filled 08/28/2021 30 DS Doxycyline 100 mg last filled 06/21/2021 90 DS Torsemide 20 mg last filled 06/20/2021 90 DS Triamcinolone ointment last filled 07/03/2020 45 DS Temazepam 15 mg last filled 09/06/2021 90 DS Tamsulosin 0.4 mg last filled 06/20/2021 90 DS Metoprolol Succinate 25 mg last filled 06/20/2021 90 DS Tadalafil 20 mg Aprmilast Nystatin ointment last filled 09/22/2020 30 DS Ipratropium-albuterol 0.5-2.5 mg/11m Vitamin C 500 mg Prednisolone Acetate 1% ophthalmic solution last filled 10/07/2019 90 DS Desonide 0.05% lotion Mometasone 50 mcg/act last filled 03/04/2021 90 DS Narcan 4 mg/0.162mlast filled 09/16/2020 30 DS Atrophine Sulfate-Nacl 0.01-0.9% solution last filled 06/21/2021 75 DS Docusate sodium 100 mg Erythromycin ophthalmic ointment last filled 09/06/2021 Oxycodone 5 mg last filled 08/30/2021 30 DS Pregabalin 50 mg last filled 08/30/2021 30 DS Diltiazem 30 mg last filled 06/08/2021 Lyrica 50 mg last filled 08/30/2021 30 DS Zioptan ophthalmic solution  Patient Questions: Any changes in your medications or health? Patient wife reports recent increase of Lyrica to 50 from 25 mg.  Any side effects from any medications?  Patient wife states the patient is not currently having any side effects from any of his medications.  Do you have any symptoms or problems not managed by your medications? Patient wife states the patient stays in Atrial Fibrillation constantly. He always has pain which causes trouble sleeping.  Any concerns about your health right now? She states their major concerns are Atrial fibrillation, heart failure and pain. He also had to recently increase his oxygen.  Has your provider asked that you check blood pressure, blood sugar, or follow special diet at home? The patient checks his blood pressure and heart rate daily, he does not follow any special diet.  He tries to measure his liquids as he can't gain any weight due to heart failute.  Do you get any type of exercise on a regular basis? Patients wife states the patient gets very little exercise, ge has very little use of his legs right now.  Can you think of a goal you would like to reach for your health? She stated "I just want him to be out of pain."  Do you have any problems getting your medications? No , they are currently receiving "help" for Eliquis.  Is there anything that you would like to discuss during the appointment?  Patient wants to go over medications with the pharmacist to make sure he is taking his medications at the right time etc. He also states Lyrica is not really helping his neuropathy.  Please bring medications and supplements to appointment.     Care Gaps: Medicare Annual Wellness: Completed 06/14/2021 Hemoglobin A1C: 5.8% on 03/01/2021 Colonoscopy: Aged out  Future Appointments  Date Time Provider [place]20[place]  2:30 PM [facility] CCM  PHARMACIST [facility] PEC  09/14/2021 11:30 AM Lorretta Harp, MD [facility]  09/14/2021  1:30 PM [facility] C-ARM RM 3 [facility] [address]    Star Rating Drugs: None  April D Calhoun, [place] Pharmacist Assistant [phone]

## 2021-09-07 NOTE — Patient Instructions (Addendum)
Visit Information   Goals Addressed             This Visit's Progress    Medication Affordability       Timeframe:  Long-Range Goal Priority:  High Start Date:   09/07/21                          Expected End Date:   03/08/22                    Follow Up Date 12/08/21    Assist with medication pricing where able.  Patient using GoodRx and assistance programs currently.   Why is this important?   These steps will help you keep on track with your medicines.   Notes:        Patient Care Plan: General Pharmacy (Adult)     Problem Identified: HTN, Afib, GERD, Insomnia, HLD   Priority: High  Onset Date: 09/07/2021     Long-Range Goal: Patient-Specific Goal   Start Date: 09/07/2021  Expected End Date: 03/08/2022  This Visit's Progress: On track  Priority: High  Note:   Current Barriers:  Unable to independently afford treatment regimen Unable to achieve control of Afib   Pharmacist Clinical Goal(s):  Patient will verbalize ability to afford treatment regimen maintain control of BP  as evidenced by monitoring  through collaboration with PharmD and provider.   Interventions: 1:1 collaboration with Jarold Motto, PA regarding development and update of comprehensive plan of care as evidenced by provider attestation and co-signature Inter-disciplinary care team collaboration (see longitudinal plan of care) Comprehensive medication review performed; medication list updated in electronic medical record  Hypertension (BP goal <140/90) -Controlled -Current treatment: Metoprolol XL 25mg  daily Diltiazem 30mg  prn for afib -Medications previously tried: none noted  -Current exercise habits: minimal, due to pain -Reports hypotensive/hypertensive symptoms - patient always feels a little dizzy -Educated on BP goals and benefits of medications for prevention of heart attack, stroke and kidney damage; Importance of home blood pressure monitoring; Symptoms of hypotension and  importance of maintaining adequate hydration; -Counseled to monitor BP at home periodically, document, and provide log at future appointments -Recommended to continue current medication  Hyperlipidemia: (LDL goal < 70) -Not ideally controlled -Current treatment: None -Medications previously tried: Atorvastatin  -Patient with Afib and high stroke risk.  Currently not on a statin medication.  Not from cardiology states that due to age and lack of clear CAD they opted to hold for now.  LDL remains elevated.  Patient's chronic pain is another factor to consider but him and his wife state he remains in Afib constantly.  May be worth considering addition of statin. -Educated on Cholesterol goals;  Benefits of statin for ASCVD risk reduction; Importance of limiting foods high in cholesterol; -Recommended to continue current medication  Atrial Fibrillation (Goal: prevent stroke and major bleeding) -Not ideally controlled -CHADSVASC: 4 -Current treatment: Rate control: Metoprolol XL 25mg  daily, diltiazem 30mg  prn Anticoagulation: Eliquis 5mg  BID -Medications previously tried: none noted -Home BP and HR readings: controlled, but tend to fluctuate  -Counseled on increased risk of stroke due to Afib and benefits of anticoagulation for stroke prevention; bleeding risk associated with Eliquis and importance of self-monitoring for signs/symptoms of bleeding; -Recommended to continue current medication Assessed patient finances. Copay is currently extremely high as they hit the donut hole in March.  They are in the process of getting patient assistance from providers at [facility].  They  have application just need to gather income information and return.  Insomnia (Goal: Improve sleep) -Controlled -Current treatment  Temazepam 15mg  hs -Medications previously tried: none ntoed - Mentions that he does have to get up and use the bathroom some nights in the middle of the night, he is now used to this.   Reports sleep for the most part is adequate.  Medication is helping.  -Recommended to continue current medication  Patient Goals/Self-Care Activities Patient will:  - take medications as prescribed check blood pressure periodically, document, and provide at future appointments collaborate with provider on medication access solutions  Follow Up Plan: The care management team will reach out to the patient again over the next 180 days.        Maurice Reed was given information about Chronic Care Management services today including:  CCM service includes personalized support from designated clinical staff supervised by his physician, including individualized plan of care and coordination with other care providers 24/7 contact phone numbers for assistance for urgent and routine care needs. Standard insurance, coinsurance, copays and deductibles apply for chronic care management only during months in which we provide at least 20 minutes of these services. Most insurances cover these services at 100%, however patients may be responsible for any copay, coinsurance and/or deductible if applicable. This service may help you avoid the need for more expensive face-to-face services. Only one practitioner may furnish and bill the service in a calendar month. The patient may stop CCM services at any time (effective at the end of the month) by phone call to the office staff.  Patient agreed to services and verbal consent obtained.   The patient verbalized understanding of instructions, educational materials, and care plan provided today and agreed to receive a mailed copy of patient instructions, educational materials, and care plan.  Telephone follow up appointment with pharmacy team member scheduled for: 6 months  Maurice Reed, Maurice Reed  [place]

## 2021-09-07 NOTE — Telephone Encounter (Signed)
I called Maurice Reed (his wife) back.  She has maximized conservative management.  He does have a pain management provider prescribed oxycodone.  He is using CBD Gummies at bedtime which do help him sleep.  That seems to be a pretty good solution for now.  Epidural steroid injection is pending.

## 2021-09-10 ENCOUNTER — Telehealth: Payer: Self-pay | Admitting: Pharmacist

## 2021-09-10 NOTE — Progress Notes (Signed)
Chronic Care Management Pharmacy Assistant   Name: JAKOTA MANTHEI  MRN: [id number] DOB: 10-30-1941  Reason for Encounter: CCM Care Plan  Medications: Outpatient Encounter Medications as of 09/10/2021  Medication Sig Note   Apremilast (OTEZLA PO) Take 30 mg by mouth 2 (two) times daily.    Ascorbic Acid (VITAMIN C) 500 MG CAPS Take 1 capsule by mouth daily.    Atropine Sulfate-NaCl 0.01-0.9 % SOLN Apply 1 drop to eye at bedtime.     Cholecalciferol (VITAMIN D3) 3000 units TABS Take 1 tablet by mouth daily.    clobetasol (TEMOVATE) 0.05 % external solution Apply 1 application topically 2 (two) times daily.    clobetasol (TEMOVATE) 0.05 % external solution APPLY TO AFFECTED AREA(S)  TOPICALLY ON SCALP 1 TO 2  TIMES DAILY AS NEEDED    desonide (DESOWEN) 0.05 % lotion Apply 1 application topically as needed.    diltiazem (CARDIZEM) 30 MG tablet Take 1 tablet every 4 hours AS NEEDED for heart rate >100    docusate sodium (COLACE) 100 MG capsule Take 100 mg by mouth daily.     doxycycline (VIBRA-TABS) 100 MG tablet TAKE 1 TABLET BY MOUTH  TWICE DAILY    ELIQUIS 5 MG TABS tablet TAKE 1 TABLET BY MOUTH  TWICE DAILY    erythromycin ophthalmic ointment APP THIN LAYER IN OS BID    ipratropium-albuterol (DUONEB) 0.5-2.5 (3) MG/3ML SOLN Take 3 mLs by nebulization every 6 (six) hours as needed.    ketorolac (ACULAR) 0.5 % ophthalmic solution Place 1 drop into the right eye 4 (four) times daily.    magnesium oxide (MAG-OX) 400 MG tablet TAKE 1 TABLET BY MOUTH TWICE A DAY    metoprolol succinate (TOPROL-XL) 25 MG 24 hr tablet TAKE 1 TABLET BY MOUTH  DAILY    mometasone (NASONEX) 50 MCG/ACT nasal spray Place 2 sprays into the nose daily. (Patient taking differently: Place 2 sprays into the nose as needed.)    NARCAN 4 MG/0.1ML LIQD nasal spray kit INSTILL ONE SPRAY PRN FOR ACCIDENTAL OVERDOSE 03/06/2019: prn   nystatin ointment (MYCOSTATIN) Apply 1 application topically 2 (two) times daily.    oxyCODONE  (OXY IR/ROXICODONE) 5 MG immediate release tablet Take 5 mg by mouth 3 (three) times daily as needed. for pain    potassium chloride (MICRO-K) 10 MEQ CR capsule TAKE 2 CAPSULES BY MOUTH IN THE AM AND 1 CAPSULE IN THE EVENING    prednisoLONE acetate (PRED FORTE) 1 % ophthalmic suspension Place 1 drop into the left eye 2 (two) times daily.     pregabalin (LYRICA) 50 MG capsule Take 50 mg by mouth 2 (two) times daily.    tadalafil (CIALIS) 20 MG tablet Take 0.5-1 tablets (10-20 mg total) by mouth every other day as needed for erectile dysfunction.    tamsulosin (FLOMAX) 0.4 MG CAPS capsule TAKE 1 CAPSULE BY MOUTH AT  BEDTIME    temazepam (RESTORIL) 15 MG capsule TAKE 1 CAPSULE (15 MG TOTAL) BY MOUTH AT BEDTIME AS NEEDED FOR SLEEP.    torsemide (DEMADEX) 20 MG tablet TAKE 2 TABLETS BY MOUTH IN  THE MORNING AND 1 TABLET BY MOUTH IN THE EVENING    TRIAMCINOLONE PO Uses ointment as needed    No facility-administered encounter medications on file as of 09/10/2021.   Reviewed the patients initial visit reinsured it was completed per the pharmacist Leata Mouse request. Printed the CCM care plan. Mailed the patient CCM care plan to their most recent address on  file.  Follow-Up:Pharmacist Review  Charlann Lange, [place] Clinical Pharmacist Assistant [phone]

## 2021-09-14 ENCOUNTER — Ambulatory Visit
Admission: RE | Admit: 2021-09-14 | Discharge: 2021-09-14 | Disposition: A | Discharge status: Home / Self Care | Payer: Medicare Other | Source: Ambulatory Visit | Attending: Family Medicine | Admitting: Family Medicine

## 2021-09-14 ENCOUNTER — Ambulatory Visit: Payer: Medicare Other | Admitting: Cardiovascular Disease

## 2021-09-14 ENCOUNTER — Other Ambulatory Visit: Payer: Self-pay | Admitting: Family Medicine

## 2021-09-14 ENCOUNTER — Other Ambulatory Visit: Payer: Self-pay

## 2021-09-14 DIAGNOSIS — M5412 Radiculopathy, cervical region: Secondary | ICD-10-CM

## 2021-09-14 IMAGING — XA DG INJECT/[PERSON_NAME] INC NEEDLE/CATH/PLC EPI/CERV/THOR W/IMG
2 series · 2 of 2 positions shown · non-contrast
Comparison: none

CLINICAL DATA: Cervical spondylosis without myelopathy. Positive
response to the 2 prior epidural injections. Recurrent neck and left
upper extremity pain.

[Series 1: ortho standard · 1 of 1 slices shown (1 of 2)]
[im 1/1]
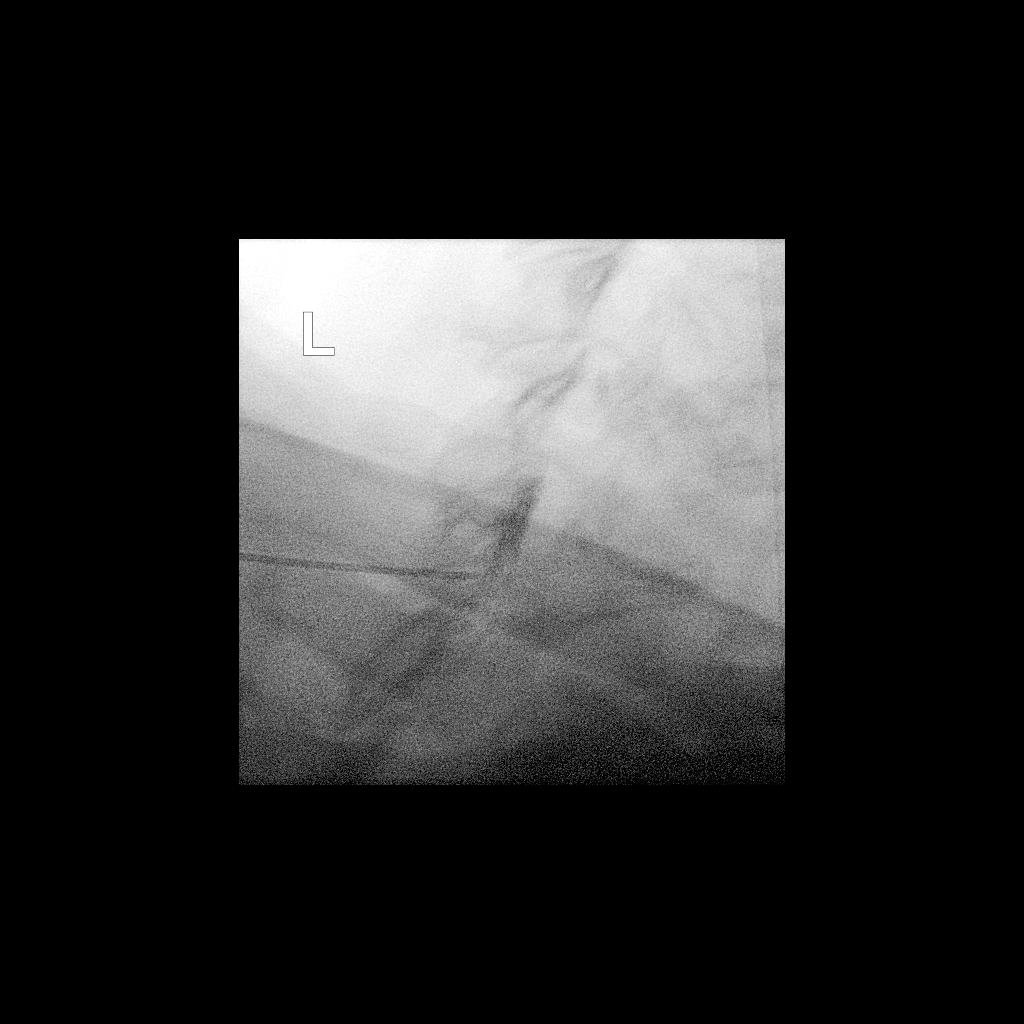

[Series 2: ortho standard · 1 of 1 slices shown (2 of 2)]
[im 1/1]
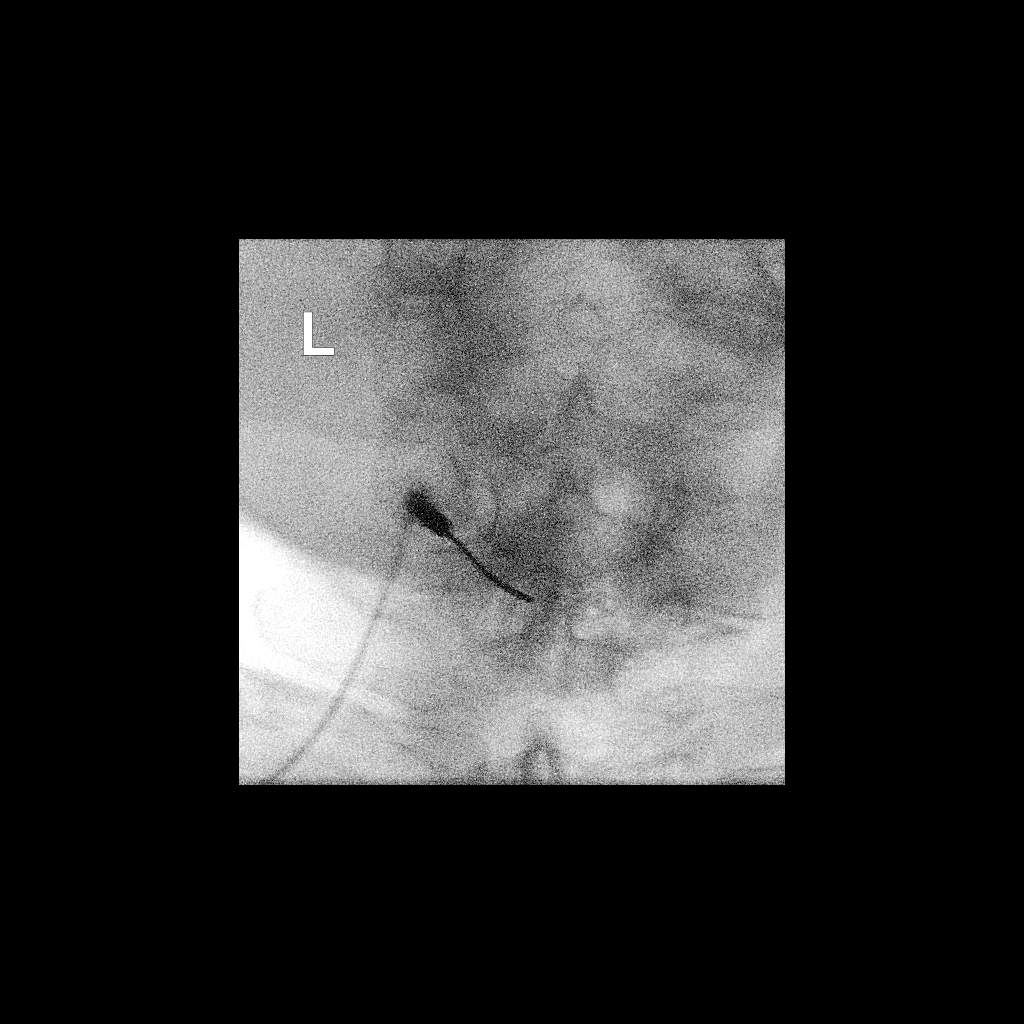

[2 of 2 positions shown; findings below may reference images not displayed]

FLUOROSCOPY TIME:  Fluoroscopy Time: 26 seconds

Radiation Exposure Index: 14.56 microGray*m^2

PROCEDURE:
The procedure, risks, benefits, and alternatives were explained to
the patient. Questions regarding the procedure were encouraged and
answered. The patient understands and consents to the procedure.

CERVICAL EPIDURAL INJECTION

An interlaminar approach was performed on the left at C7-T1. A
inch 20 gauge epidural needle was advanced using loss-of-resistance
technique.

DIAGNOSTIC EPIDURAL INJECTION

Injection of Isovue-M 300 shows a good epidural pattern with spread
above and below the level of needle placement, primarily on the
left. No vascular opacification is seen.

THERAPEUTIC EPIDURAL INJECTION

1.5 ml of Kenalog 40 mixed with 2 ml of normal saline were then
instilled. The procedure was well-tolerated, and the patient was
discharged thirty minutes following the injection in good condition.
IMPRESSION: Technically successful interlaminar epidural injection on the left
at C7-T1.

## 2021-09-14 MED ORDER — IOPAMIDOL (ISOVUE-M 300) INJECTION 61%
1.0000 mL | Freq: Once | INTRAMUSCULAR | Status: AC
  Administered 2021-09-14: 1 mL via EPIDURAL

## 2021-09-14 MED ORDER — TRIAMCINOLONE ACETONIDE 40 MG/ML IJ SUSP (RADIOLOGY)
60.0000 mg | Freq: Once | INTRAMUSCULAR | Status: AC
  Administered 2021-09-14: 60 mg via EPIDURAL

## 2021-09-14 NOTE — Discharge Instructions (Signed)

## 2021-09-26 ENCOUNTER — Telehealth: Payer: Self-pay | Admitting: Physician Assistant

## 2021-09-27 MED ORDER — APIXABAN 5 MG PO TABS: 5.0000 mg | ORAL_TABLET | Freq: Two times a day (BID) | ORAL | 1 refills | Status: DC

## 2021-09-27 NOTE — Telephone Encounter (Signed)
Patient's spouse [name] has called into the office stating that a new script needs to be sent to [facility] in [place] for eliquis 5mg .  States no longer uses [facility].    Can reach [place] back at [phone].

## 2021-09-27 NOTE — Telephone Encounter (Signed)
Rx sent to [facility] in [place].

## 2021-09-27 NOTE — Addendum Note (Signed)
Addended by: Jimmye Norman on: 09/27/2021 01:04 PM   Modules accepted: Orders

## 2021-09-30 NOTE — Progress Notes (Signed)
Subjective:    Maurice Reed is a 80 y.o. male and is here for a comprehensive physical exam.   HPI  Health Maintenance Due  Topic Date Due   INFLUENZA VACCINE  07/05/2021   Maurice Reed presents to today's visit with his wife, Maurice Reed Maurice Reed has undergone routine blood work at [facility] - we reviewed this today.  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 Maurice Reed is interested in getting Maurice Reed a wheelchair.   HLD Has been on lipitor 40 mg in past but this was stopped, due to age and lack of clear CAD they opted to hold for now.   HTN Currently compliant with taking Toprol- XL 25 mg and aldactone 25 mg with no adverse effects. Patient denies chest pain, SOB, blurred vision, dizziness, unusual headaches, lower leg swelling. Denies excessive caffeine intake, stimulant usage, excessive alcohol intake, or increase in salt consumption.  BP Readings from Last 3 Encounters:  10/01/21 118/72  09/14/21 (!) 151/72  08/30/21 102/62   A- Fib Currently compliant with taking Eliquis 5 mg with no adverse effects. Maurice Reed visited Dr. Hillery Jacks, Cardiology and underwent echocardiogram that resulted as unremarkable. He is managing well.   LE Pain Currently complaint with lyrica 50 mg with no adverse effects. He finds this helpful with managing the pain in his lower extremities. Currently managing well.   Chronic Respiratory Failure with Hypoxia Maurice Reed is up to date with visiting Dr. Clent Ridges, Pulmonology, and is compliant with supplemental oxygen as needed. Baseline he uses 3L oxygen at home and at night;  5L when using POC. Currently managing well.   Insulin resistance 6 month follow-up. No current medications.. Blood sugars at home are: not checked. Denies: hypoglycemic or hyperglycemic episodes or symptoms. This patient's diabetes is complicated by HTN, HLD.  Lab  Results  Component Value Date   HGBA1C 5.8 03/01/2021    Health Maintenance: Immunizations -- COVID- not completed Influenza Vaccine- Last completed 09/08/20 Colonoscopy -- Not completed  PSA -- No results found for: PSA1, PSA Diet -- Eating well Sleep habits -- Normal Exercise -- None. Weight -- Stable  Recent weight history Wt Readings from Last 10 Encounters:  10/01/21 271 lb (122.9 kg)  06/08/21 273 lb 12.8 oz (124.2 kg)  06/01/21 270 lb 6.4 oz (122.7 kg)  03/17/21 266 lb (120.7 kg)  03/08/21 270 lb (122.5 kg)  03/01/21 270 lb 12.8 oz (122.8 kg)  01/27/21 264 lb 6.4 oz (119.9 kg)  10/14/20 268 lb (121.6 kg)  09/08/20 271 lb (122.9 kg)  08/21/20 274 lb 12.8 oz (124.6 kg)   Body mass index is 38.88 kg/m. Mood -- Stable Alcohol use --  reports current alcohol use of about 1.0 standard drink per week.  Tobacco use --  Tobacco Use: Medium Risk   Smoking Tobacco Use: Former   Smokeless Tobacco Use: Never   Passive Exposure: Not on file     Depression screen [facility] 2/9 10/01/2021  Decreased Interest 0  Down, Depressed, Hopeless 0  PHQ - 2 Score 0  Altered sleeping -  Tired, decreased energy -  Change in appetite -  Feeling bad or failure about yourself  -  Trouble concentrating -  Moving slowly or fidgety/restless -  Suicidal thoughts -  PHQ-9 Score -    Other providers/specialists: Patient Care Team: Jarold Motto, [name] as PCP - General (  Physician Assistant) Runell Gess, MD as PCP - Cardiology (Cardiology) Oretha Milch, MD as Consulting Physician (Pulmonary Disease) Runell Gess, MD as Consulting Physician (Cardiology) Bordelon, Lorenza Chick, MD as Consulting Physician (Ophthalmology) Merryl Hacker, NP as Nurse Practitioner (Pain Medicine) Glendale Chard, DO as Consulting Physician (Neurology) Merryl Hacker, NP as Nurse Practitioner (Nurse Practitioner) Erroll Luna, [facility] as Pharmacist (Pharmacist)    PMHx, SurgHx, SocialHx,  Medications, and Allergies were reviewed in the Visit Navigator and updated as appropriate.   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   Diverticula, bladder    Erectile dysfunction    GERD (gastroesophageal reflux disease)    Kidney stones    Obesity    Pulmonary embolism (HCC)      Past Surgical History:  Procedure Laterality Date   APPENDECTOMY  1963   LITHOTRIPSY  1987   NOSE SURGERY  2003   TONSILLECTOMY AND ADENOIDECTOMY  1948   UMBILICAL HERNIA REPAIR  1984     Family History  Problem Relation Age of Onset   Arthritis Mother    Hearing loss Mother    Hypertension Mother    Heart disease Mother    Stroke Mother    Arthritis Father    Hearing loss Father    Heart disease Father    Hypertension Father    Heart attack Father    Kidney disease Father    Stroke Father     Social History   Tobacco Use   Smoking status: Former    Packs/day: 3.00    Years: 25.00    Pack years: 75.00    Types: Cigarettes    Quit date: 11/28/1984    Years since quitting: 36.8   Smokeless tobacco: Never  Vaping Use   Vaping Use: Never used  Substance Use Topics   Alcohol use: Yes    Alcohol/week: 1.0 standard drink    Types: 1 Glasses of wine per week    Comment: at times   Drug use: Not Currently    Types: Marijuana    Comment: Gummies     Review of Systems:   Review of Systems  Constitutional:  Negative for chills, fever, malaise/fatigue and weight loss.  HENT:  Negative for hearing loss, sinus pain and sore throat.   Respiratory:  Negative for cough and hemoptysis.   Cardiovascular:  Negative for chest pain, palpitations, leg swelling and PND.  Gastrointestinal:  Negative for abdominal pain, constipation, diarrhea, heartburn, nausea and vomiting.  Genitourinary:  Negative for dysuria, frequency and urgency.  Musculoskeletal:  Negative for back pain, myalgias and neck  pain.  Skin:  Negative for itching and rash.  Neurological:  Negative for dizziness, tingling, seizures and headaches.  Endo/Heme/Allergies:  Negative for polydipsia.  Psychiatric/Behavioral:  Negative for depression. The patient is not nervous/anxious.     Objective:    Vitals:   10/01/21 0937  BP: 118/72  Pulse: 88  Temp: 98.4 F (36.9 C)  SpO2: 96%   Body mass index is 38.88 kg/m.  General  Alert, cooperative, no distress, appears stated age  Head:  Normocephalic, without obvious abnormality, atraumatic  Eyes:  PERRL, conjunctiva/corneas clear, EOM's intact, fundi benign, both eyes       Ears:  Normal TM's and external ear canals, both ears  Nose: Nares normal, septum midline, mucosa normal, no drainage or sinus  tenderness  Throat: Lips, mucosa, and tongue normal; teeth and gums normal  Neck: Supple, symmetrical, trachea midline, no adenopathy;     thyroid:  No enlargement/tenderness/nodules; no carotid bruit or JVD  Back:   Symmetric, no curvature, ROM normal, no CVA tenderness  Lungs:   Clear to auscultation bilaterally, respirations unlabored  Chest wall:  No tenderness or deformity  Heart:  Irregularly irregular S1 and S2 normal, no murmur, rub or gallop  Abdomen:   Soft, non-tender, bowel sounds active all four quadrants, no masses, no organomegaly  Extremities: Extremities normal, atraumatic, no cyanosis or edema  Prostate : Deferred   Skin: Skin color, texture, turgor normal, no rashes or lesions  Lymph nodes: Cervical, supraclavicular, and axillary nodes normal  Neurologic: CNII-XII grossly intact. Normal strength, sensation and reflexes throughout   AssessmentPlan:   Routine physical examination Today patient counseled on age appropriate routine health concerns for screening and prevention, each reviewed and up to date or declined. Immunizations reviewed and up to date or declined. Labs ordered and reviewed. Risk factors for depression reviewed and negative.  Hearing function and visual acuity are intact. ADLs screened and addressed as needed. Functional ability and level of safety reviewed and appropriate. Education, counseling and referrals performed based on assessed risks today. Patient provided with a copy of personalized plan for preventive services.  Insulin resistance Update A1c and provide recommendations accordingly  Mixed hyperlipidemia Update lipid panel and provide recommendations accordingly  Bilateral leg weakness Chronic Improving with lyrica Wheelchair order provided  Permanent atrial fibrillation (HCC); HTN Management per cardiology  Obesity, unspecified classification, unspecified obesity type, unspecified whether serious comorbidity present Continue healthy lifestyle efforts  Chronic respiratory failure with hypoxia (HCC) Management per pulmonary     Patient Counseling: [x]   Nutrition: Stressed importance of moderation in sodium/caffeine intake, saturated fat and cholesterol, caloric balance, sufficient intake of fresh fruits, vegetables, and fiber  [x]   Stressed the importance of regular exercise.   []   Substance Abuse: Discussed cessation/primary prevention of tobacco, alcohol, or other drug use; driving or other dangerous activities under the influence; availability of treatment for abuse.   [x]   Injury prevention: Discussed safety belts, safety helmets, smoke detector, smoking near bedding or upholstery.   []   Sexuality: Discussed sexually transmitted diseases, partner selection, use of condoms, avoidance of unintended pregnancy  and contraceptive alternatives.   [x]   Dental health: Discussed importance of regular tooth brushing, flossing, and dental visits.  [x]   Health maintenance and immunizations reviewed. Please refer to Health maintenance section.   I,Havlyn C Ratchford,acting as a for , PA.,have documented all relevant documentation on the behalf of , PA,as directed by   , PA while in the presence of , .   I, , Neurosurgeon, have reviewed all documentation for this visit. The documentation on 10/01/21 for the exam, diagnosis, procedures, and orders are all accurate and complete.   Jarold Motto, PA-C [facility]

## 2021-10-01 ENCOUNTER — Other Ambulatory Visit: Payer: Self-pay

## 2021-10-01 ENCOUNTER — Encounter: Payer: Self-pay | Admitting: Physician Assistant

## 2021-10-01 ENCOUNTER — Ambulatory Visit (INDEPENDENT_AMBULATORY_CARE_PROVIDER_SITE_OTHER): Payer: Medicare Other | Admitting: Physician Assistant

## 2021-10-01 VITALS — BP 118/72 | HR 88 | Temp 98.4°F | Ht 70.0 in | Wt 271.0 lb

## 2021-10-01 DIAGNOSIS — E782 Mixed hyperlipidemia: Secondary | ICD-10-CM

## 2021-10-01 DIAGNOSIS — J9611 Chronic respiratory failure with hypoxia: Secondary | ICD-10-CM

## 2021-10-01 DIAGNOSIS — Z0000 Encounter for general adult medical examination without abnormal findings: Secondary | ICD-10-CM | POA: Diagnosis not present

## 2021-10-01 DIAGNOSIS — Z23 Encounter for immunization: Secondary | ICD-10-CM | POA: Diagnosis not present

## 2021-10-01 DIAGNOSIS — R29898 Other symptoms and signs involving the musculoskeletal system: Secondary | ICD-10-CM

## 2021-10-01 DIAGNOSIS — E8881 Metabolic syndrome: Secondary | ICD-10-CM | POA: Diagnosis not present

## 2021-10-01 DIAGNOSIS — I10 Essential (primary) hypertension: Secondary | ICD-10-CM

## 2021-10-01 DIAGNOSIS — I4821 Permanent atrial fibrillation: Secondary | ICD-10-CM

## 2021-10-01 DIAGNOSIS — E669 Obesity, unspecified: Secondary | ICD-10-CM

## 2021-10-01 LAB — CBC WITH DIFFERENTIAL/PLATELET
Basophils Absolute: 0 10*3/uL (ref 0.0–0.1)
Basophils Relative: 0.3 % (ref 0.0–3.0)
Eosinophils Absolute: 0.1 10*3/uL (ref 0.0–0.7)
Eosinophils Relative: 0.7 % (ref 0.0–5.0)
HCT: 39.3 % (ref 39.0–52.0)
Hemoglobin: 13.2 g/dL (ref 13.0–17.0)
Lymphocytes Relative: 19.3 % (ref 12.0–46.0)
Lymphs Abs: 1.5 10*3/uL (ref 0.7–4.0)
MCHC: 33.5 g/dL (ref 30.0–36.0)
MCV: 103.2 fl — ABNORMAL HIGH (ref 78.0–100.0)
Monocytes Absolute: 0.7 10*3/uL (ref 0.1–1.0)
Monocytes Relative: 9.7 % (ref 3.0–12.0)
Neutro Abs: 5.3 10*3/uL (ref 1.4–7.7)
Neutrophils Relative %: 70 % (ref 43.0–77.0)
Platelets: 175 10*3/uL (ref 150.0–400.0)
RBC: 3.81 Mil/uL — ABNORMAL LOW (ref 4.22–5.81)
RDW: 13.5 % (ref 11.5–15.5)
WBC: 7.6 10*3/uL (ref 4.0–10.5)

## 2021-10-01 LAB — COMPREHENSIVE METABOLIC PANEL
ALT: 12 U/L (ref 0–53)
AST: 16 U/L (ref 0–37)
Albumin: 3.6 g/dL (ref 3.5–5.2)
Alkaline Phosphatase: 62 U/L (ref 39–117)
BUN: 20 mg/dL (ref 6–23)
CO2: 40 mEq/L — ABNORMAL HIGH (ref 19–32)
Calcium: 9.9 mg/dL (ref 8.4–10.5)
Chloride: 96 mEq/L (ref 96–112)
Creatinine, Ser: 0.99 mg/dL (ref 0.40–1.50)
GFR: 71.89 mL/min (ref >60.00)
Glucose, Bld: 105 mg/dL — ABNORMAL HIGH (ref 70–99)
Potassium: 3.8 mEq/L (ref 3.5–5.1)
Sodium: 140 mEq/L (ref 135–145)
Total Bilirubin: 0.9 mg/dL (ref 0.2–1.2)
Total Protein: 6.1 g/dL (ref 6.0–8.3)

## 2021-10-01 LAB — LIPID PANEL
Cholesterol: 167 mg/dL (ref 0–200)
HDL: 53.3 mg/dL (ref >39.00)
LDL Cholesterol: 99 mg/dL (ref 0–99)
NonHDL: 113.88
Total CHOL/HDL Ratio: 3
Triglycerides: 72 mg/dL (ref 0.0–149.0)
VLDL: 14.4 mg/dL (ref 0.0–40.0)

## 2021-10-01 LAB — HEMOGLOBIN A1C: Hgb A1c MFr Bld: 5.9 % (ref 4.6–6.5)

## 2021-10-01 NOTE — Patient Instructions (Addendum)
It was great to see you!  You had a wellness visit with our nurse in July 2022 -- you do not need further wellness visits through your insurance.  I will be in touch with blood work results.  Let's follow-up as needed , sooner if you have concerns.  If a referral was placed today, you will be contacted for an appointment. Please note that routine referrals can sometimes take up to 3-4 weeks to process. Please call our office if you haven't heard anything after this time frame.  Take care,  Jarold Motto PA-C

## 2021-10-04 DIAGNOSIS — I4821 Permanent atrial fibrillation: Secondary | ICD-10-CM | POA: Diagnosis not present

## 2021-10-04 DIAGNOSIS — I10 Essential (primary) hypertension: Secondary | ICD-10-CM | POA: Diagnosis not present

## 2021-10-04 DIAGNOSIS — E782 Mixed hyperlipidemia: Secondary | ICD-10-CM

## 2021-11-11 ENCOUNTER — Telehealth: Payer: Self-pay

## 2021-11-11 NOTE — Telephone Encounter (Signed)
Patient Name: Maurice Reed Gender: Male DOB: 1941-12-02 Age: 80 Y 8 M 23 D Return Phone Number: [phone] (Primary) Address: City/ State/ Zip: [place]  [address] Client [facility] - Administrator, [facility] at [facility] Provider Bufford Buttner, [place]- PA Contact Type Call Who Is Calling Patient / Member / Family / Caregiver Call Type Triage / Clinical Relationship To Patient Self Return Phone Number [phone] (Primary) Chief Complaint BREATHING - shortness of breath or sounds breathless Reason for Call Symptomatic / Request for Health Information Initial Comment Caller states the patient is having breathing difficulties. GOTO Facility Not [facility] ED Translation No Nurse Assessment Nurse: Henri Medal, RN, Amy Date/Time Lamount Cohen Time): 11/11/2021 1:47:01 PM Confirm and document reason for call. If symptomatic, describe symptoms. ---Caller states the patient is having breathing difficulties. She has Covid & has been symptomatic since Saturday. He started out with diarrhea like she did. He has chills. No fever but feels clammy. They have had to turn his O2 up from 3.5 liters to 5 liters. His O2 sat was dropping into the low 80's when walking to the bathroom. They turned it up to 5 liters & it will still drop to 90% with exertion. He is coughing & has a sore throat. He has a cough anyway, but this is worse than normal. He can't cough stuff up & spit it out due to his lung problems. Does the patient have any new or worsening symptoms? ---Yes Will a triage be completed? ---Yes Related visit to physician within the last 2 weeks? ---No Does the PT have any chronic conditions? (i.e. diabetes, asthma, this includes High risk factors for pregnancy, etc.) ---Yes List chronic conditions. ---lung disease Is this a behavioral health or substance abuse call? ---No PLEASE NOTE: All timestamps contained within this  report are represented as [place] Standard Time. CONFIDENTIALTY NOTICE: This fax transmission is intended only for the addressee. It contains information that is legally privileged, confidential or otherwise protected from use or disclosure. If you are not the intended recipient, you are strictly prohibited from reviewing, disclosing, copying using or disseminating any of this information or taking any action in reliance on or regarding this information. If you have received this fax in error, please notify us immediately by telephone so that we can arrange for its return to [place]. Phone: [phone], Toll-Free: [phone], Fax: [phone] Page: 2 of 2 Call Id: [id number] Guidelines Guideline Title Affirmed Question Affirmed Notes Nurse Date/Time Lamount Cohen Time) COVID-19 - Diagnosed or Suspected MODERATE difficulty breathing (e.g., speaks in phrases, SOB even at rest, pulse 100-120) Lovelace, RN, Amy 11/11/2021 1:49:34 PM Disp. Time Lamount Cohen Time) Disposition Final User 11/11/2021 1:42:23 PM Send to Urgent Jamesetta Orleans, [place] 11/11/2021 1:52:21 PM Go to ED Now Yes Lovelace, RN, Amy Caller Disagree/Comply Comply Caller Understands Yes PreDisposition InappropriateToAsk Care Advice Given Per Guideline GO TO ED NOW: * You need to be seen in the Emergency Department. * Leave now. Drive carefully. TELL HEALTHCARE PERSONNEL THAT YOU MIGHT HAVE COVID-19: * Tell the first healthcare worker you meet that you may have COVID-19. * Tell them you have symptoms and have been sent in for testing. WEAR A MASK - COVER YOUR MOUTH AND NOSE: * Wear a mask that fits snuggly over your mouth and nose. * If you do not have a mask, then cover your mouth and nose with a disposable tissue (e.g., Kleenex, toilet paper, paper towel) or wash cloth. CARE ADVICE given per  COVID-19 - DIAGNOSED OR SUSPECTED (Adult) guideline. Referrals GO TO FACILITY OTHER - SPECIFY

## 2021-11-11 NOTE — Telephone Encounter (Signed)
LAST APPOINTMENT DATE:  10/01/21  NEXT APPOINTMENT DATE: none  MEDICATION:ipratropium-albuterol (DUONEB) 0.5-2.5 (3) MG/3ML SOLN   PHARMACY: [id number] - [place], [place] - [address]

## 2021-11-12 ENCOUNTER — Other Ambulatory Visit: Payer: Self-pay

## 2021-11-12 ENCOUNTER — Encounter (HOSPITAL_BASED_OUTPATIENT_CLINIC_OR_DEPARTMENT_OTHER): Payer: Self-pay | Admitting: Emergency Medicine

## 2021-11-12 ENCOUNTER — Other Ambulatory Visit: Payer: Self-pay | Admitting: *Deleted

## 2021-11-12 ENCOUNTER — Telehealth (INDEPENDENT_AMBULATORY_CARE_PROVIDER_SITE_OTHER): Payer: Medicare Other | Admitting: Physician Assistant

## 2021-11-12 ENCOUNTER — Inpatient Hospital Stay (HOSPITAL_BASED_OUTPATIENT_CLINIC_OR_DEPARTMENT_OTHER)
Admission: EM | Admit: 2021-11-12 | Discharge: 2021-11-16 | DRG: 177 | Disposition: A | Discharge status: Home / Self Care | Payer: Medicare Other | Attending: Family Medicine | Admitting: Family Medicine

## 2021-11-12 ENCOUNTER — Emergency Department (HOSPITAL_BASED_OUTPATIENT_CLINIC_OR_DEPARTMENT_OTHER): Payer: Medicare Other

## 2021-11-12 ENCOUNTER — Encounter: Payer: Self-pay | Admitting: Physician Assistant

## 2021-11-12 DIAGNOSIS — Z7901 Long term (current) use of anticoagulants: Secondary | ICD-10-CM

## 2021-11-12 DIAGNOSIS — Z8249 Family history of ischemic heart disease and other diseases of the circulatory system: Secondary | ICD-10-CM

## 2021-11-12 DIAGNOSIS — K219 Gastro-esophageal reflux disease without esophagitis: Secondary | ICD-10-CM | POA: Diagnosis present

## 2021-11-12 DIAGNOSIS — U071 COVID-19: Secondary | ICD-10-CM | POA: Diagnosis not present

## 2021-11-12 DIAGNOSIS — Z86711 Personal history of pulmonary embolism: Secondary | ICD-10-CM

## 2021-11-12 DIAGNOSIS — Z87891 Personal history of nicotine dependence: Secondary | ICD-10-CM

## 2021-11-12 DIAGNOSIS — J9621 Acute and chronic respiratory failure with hypoxia: Secondary | ICD-10-CM | POA: Diagnosis present

## 2021-11-12 DIAGNOSIS — Z823 Family history of stroke: Secondary | ICD-10-CM

## 2021-11-12 DIAGNOSIS — H5462 Unqualified visual loss, left eye, normal vision right eye: Secondary | ICD-10-CM | POA: Diagnosis present

## 2021-11-12 DIAGNOSIS — Z841 Family history of disorders of kidney and ureter: Secondary | ICD-10-CM

## 2021-11-12 DIAGNOSIS — Z8261 Family history of arthritis: Secondary | ICD-10-CM

## 2021-11-12 DIAGNOSIS — E669 Obesity, unspecified: Secondary | ICD-10-CM | POA: Diagnosis present

## 2021-11-12 DIAGNOSIS — J440 Chronic obstructive pulmonary disease with acute lower respiratory infection: Secondary | ICD-10-CM | POA: Diagnosis present

## 2021-11-12 DIAGNOSIS — I110 Hypertensive heart disease with heart failure: Secondary | ICD-10-CM | POA: Diagnosis present

## 2021-11-12 DIAGNOSIS — L219 Seborrheic dermatitis, unspecified: Secondary | ICD-10-CM | POA: Diagnosis present

## 2021-11-12 DIAGNOSIS — I5032 Chronic diastolic (congestive) heart failure: Secondary | ICD-10-CM | POA: Diagnosis present

## 2021-11-12 DIAGNOSIS — J1282 Pneumonia due to coronavirus disease 2019: Secondary | ICD-10-CM | POA: Diagnosis present

## 2021-11-12 DIAGNOSIS — L719 Rosacea, unspecified: Secondary | ICD-10-CM | POA: Diagnosis present

## 2021-11-12 DIAGNOSIS — I4819 Other persistent atrial fibrillation: Secondary | ICD-10-CM | POA: Diagnosis present

## 2021-11-12 DIAGNOSIS — G894 Chronic pain syndrome: Secondary | ICD-10-CM | POA: Diagnosis present

## 2021-11-12 DIAGNOSIS — Z6838 Body mass index (BMI) 38.0-38.9, adult: Secondary | ICD-10-CM

## 2021-11-12 DIAGNOSIS — Z79899 Other long term (current) drug therapy: Secondary | ICD-10-CM

## 2021-11-12 DIAGNOSIS — Z66 Do not resuscitate: Secondary | ICD-10-CM | POA: Diagnosis present

## 2021-11-12 LAB — COMPREHENSIVE METABOLIC PANEL
ALT: 12 U/L (ref 0–44)
AST: 19 U/L (ref 15–41)
Albumin: 3.6 g/dL (ref 3.5–5.0)
Alkaline Phosphatase: 86 U/L (ref 38–126)
Anion gap: 7 (ref 5–15)
BUN: 12 mg/dL (ref 8–23)
CO2: 37 mmol/L — ABNORMAL HIGH (ref 22–32)
Calcium: 9.6 mg/dL (ref 8.9–10.3)
Chloride: 96 mmol/L — ABNORMAL LOW (ref 98–111)
Creatinine, Ser: 0.91 mg/dL (ref 0.61–1.24)
GFR, Estimated: >60 mL/min (ref >60)
Glucose, Bld: 90 mg/dL (ref 70–99)
Potassium: 3.5 mmol/L (ref 3.5–5.1)
Sodium: 140 mmol/L (ref 135–145)
Total Bilirubin: 0.3 mg/dL (ref 0.3–1.2)
Total Protein: 6.9 g/dL (ref 6.5–8.1)

## 2021-11-12 LAB — FIBRINOGEN: Fibrinogen: 564 mg/dL — ABNORMAL HIGH (ref 210–475)

## 2021-11-12 LAB — TROPONIN I (HIGH SENSITIVITY)
Troponin I (High Sensitivity): 7 ng/L (ref <18)
Troponin I (High Sensitivity): 7 ng/L (ref <18)

## 2021-11-12 LAB — D-DIMER, QUANTITATIVE: D-Dimer, Quant: 1.13 ug/mL-FEU — ABNORMAL HIGH (ref 0.00–0.50)

## 2021-11-12 LAB — LACTIC ACID, PLASMA: Lactic Acid, Venous: 1.1 mmol/L (ref 0.5–1.9)

## 2021-11-12 LAB — CBC WITH DIFFERENTIAL/PLATELET
Abs Immature Granulocytes: 0.02 10*3/uL (ref 0.00–0.07)
Basophils Absolute: 0 10*3/uL (ref 0.0–0.1)
Basophils Relative: 0 %
Eosinophils Absolute: 0.1 10*3/uL (ref 0.0–0.5)
Eosinophils Relative: 1 %
HCT: 39.7 % (ref 39.0–52.0)
Hemoglobin: 12.7 g/dL — ABNORMAL LOW (ref 13.0–17.0)
Immature Granulocytes: 1 %
Lymphocytes Relative: 37 %
Lymphs Abs: 1.6 10*3/uL (ref 0.7–4.0)
MCH: 33.9 pg (ref 26.0–34.0)
MCHC: 32 g/dL (ref 30.0–36.0)
MCV: 105.9 fL — ABNORMAL HIGH (ref 80.0–100.0)
Monocytes Absolute: 0.8 10*3/uL (ref 0.1–1.0)
Monocytes Relative: 18 %
Neutro Abs: 1.8 10*3/uL (ref 1.7–7.7)
Neutrophils Relative %: 43 %
Platelets: 182 10*3/uL (ref 150–400)
RBC: 3.75 MIL/uL — ABNORMAL LOW (ref 4.22–5.81)
RDW: 12.9 % (ref 11.5–15.5)
WBC: 4.2 10*3/uL (ref 4.0–10.5)
nRBC: 0 % (ref 0.0–0.2)

## 2021-11-12 LAB — RESP PANEL BY RT-PCR (FLU A&B, COVID) ARPGX2
Influenza A by PCR: NEGATIVE
Influenza B by PCR: NEGATIVE
SARS Coronavirus 2 by RT PCR: POSITIVE — AB

## 2021-11-12 LAB — LACTATE DEHYDROGENASE: LDH: 120 U/L (ref 98–192)

## 2021-11-12 LAB — BRAIN NATRIURETIC PEPTIDE: B Natriuretic Peptide: 120.9 pg/mL — ABNORMAL HIGH (ref 0.0–100.0)

## 2021-11-12 IMAGING — DX DG CHEST 1V PORT
1 series · 1 of 1 positions shown · non-contrast
Comparison: Chest radiograph and CTA [DATE]

CLINICAL DATA: Shortness of breath.  COVID positive.

EXAM:
PORTABLE CHEST 1 VIEW

[chest]
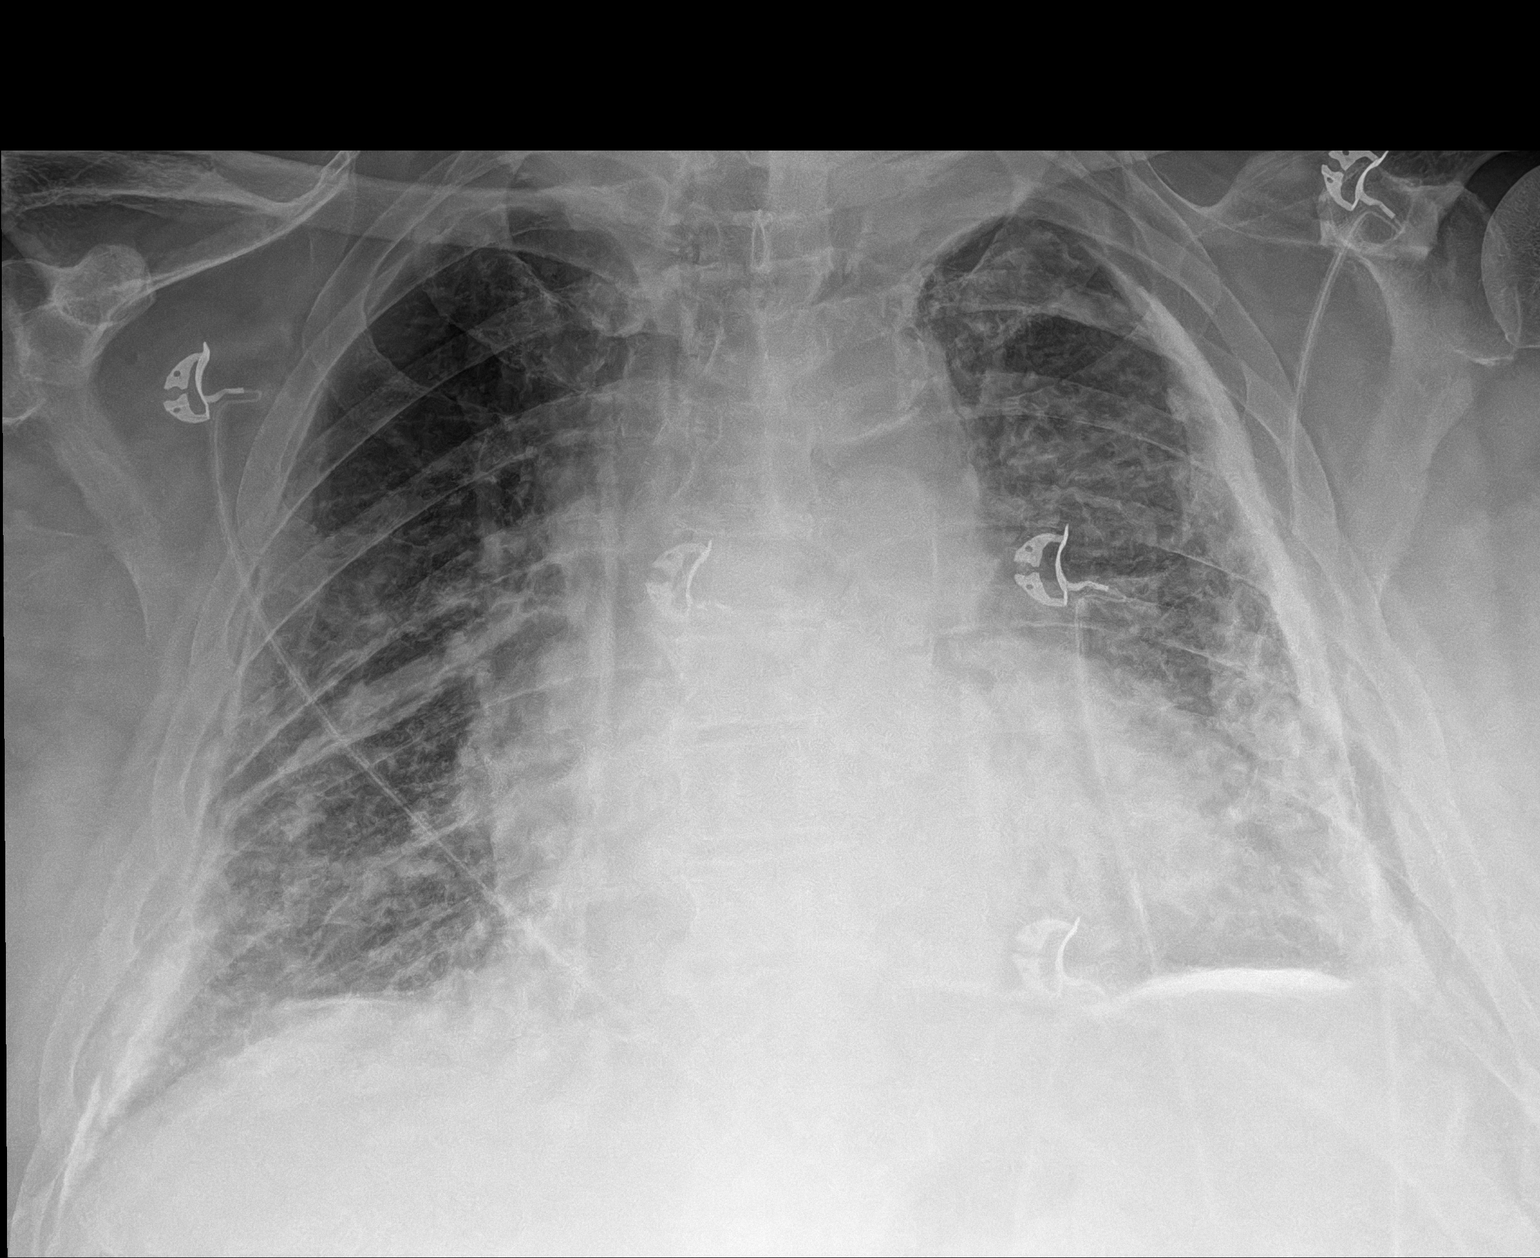

[1 of 1 positions shown; findings below may reference images not displayed]

FINDINGS: The cardiac silhouette remains enlarged. Aortic atherosclerosis is
noted. Extensive bilateral pleural calcification and pleural
thickening are again seen. Coarse bilateral interstitial densities
have increased from the prior study, and there are also scattered
new patchy opacities bilaterally. No large pleural effusion or
pneumothorax is identified.
IMPRESSION: Increased bilateral lung opacities which may reflect pneumonia
superimposed on chronic lung disease.

## 2021-11-12 MED ORDER — AEROCHAMBER PLUS FLO-VU LARGE MISC
1.0000 | Freq: Once | Status: AC
  Administered 2021-11-12: 1
  Filled 2021-11-12: qty 1

## 2021-11-12 MED ORDER — SODIUM CHLORIDE 0.9 % IV SOLN
100.0000 mg | Freq: Once | INTRAVENOUS | Status: AC
  Administered 2021-11-13: 100 mg via INTRAVENOUS

## 2021-11-12 MED ORDER — SODIUM CHLORIDE 0.9 % IV SOLN: INTRAVENOUS | Status: DC | PRN

## 2021-11-12 MED ORDER — SODIUM CHLORIDE 0.9 % IV SOLN
500.0000 mg | Freq: Once | INTRAVENOUS | Status: AC
  Administered 2021-11-12: 500 mg via INTRAVENOUS
  Filled 2021-11-12: qty 5

## 2021-11-12 MED ORDER — ALBUTEROL SULFATE HFA 108 (90 BASE) MCG/ACT IN AERS
2.0000 | INHALATION_SPRAY | Freq: Once | RESPIRATORY_TRACT | Status: AC
  Administered 2021-11-12: 2 via RESPIRATORY_TRACT
  Filled 2021-11-12: qty 6.7

## 2021-11-12 MED ORDER — SODIUM CHLORIDE 0.9 % IV SOLN
1.0000 g | Freq: Once | INTRAVENOUS | Status: AC
  Administered 2021-11-12: 1 g via INTRAVENOUS
  Filled 2021-11-12: qty 10

## 2021-11-12 MED ORDER — IPRATROPIUM-ALBUTEROL 0.5-2.5 (3) MG/3ML IN SOLN: 3.0000 mL | Freq: Four times a day (QID) | RESPIRATORY_TRACT | 3 refills | Status: DC | PRN

## 2021-11-12 MED ORDER — SODIUM CHLORIDE 0.9 % IV SOLN
100.0000 mg | Freq: Every day | INTRAVENOUS | Status: AC
  Administered 2021-11-13 – 2021-11-15 (×3): 100 mg via INTRAVENOUS
  Filled 2021-11-12 (×2): qty 20

## 2021-11-12 NOTE — Telephone Encounter (Signed)
Please advise 

## 2021-11-12 NOTE — Progress Notes (Signed)
TELEPHONE ENCOUNTER   Patient verbally agreed to telephone visit and is aware that copayment and coinsurance may apply. Patient was treated using telemedicine according to accepted telemedicine protocols.  Location of the patient: home Location of provider: office Names of all persons participating in the telemedicine service and role in the encounter: Maurice Coke, PA , Maurice Reed, Maurice Reed  Subjective:   Chief Complaint  Patient presents with   Cough    Cough with a lot of mucus, but unable to get mucus to come out      HPI   Per triage: Caller states the patient is having breathing difficulties. She has Covid & has been symptomatic since Saturday. He started out with diarrhea like she did. He has chills. No fever but feels clammy. They have had to turn his O2 up from 3.5 liters to 5 liters. His O2 sat was dropping into the low 80's when walking to the bathroom. They turned it up to 5 liters & it will still drop to 90% with exertion. He is coughing & has a sore throat. He has a cough anyway, but this is worse than normal. He can't cough stuff up & spit it out due to his lung problems.  Patient's wife, Maurice Reed, on the phone. She states that patient is declining and needs to go to the ER.   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 10/17/2018   Bilateral lower extremity edema 07/04/2018   Sclerosis of the skin 06/26/2018   ILD (interstitial lung  disease) ([place]) 05/22/2018   Insomnia 05/22/2018   Erectile dysfunction 05/22/2018   Obesity    Kidney stones    Essential hypertension    GERD (gastroesophageal reflux disease)    Former smoker    Arthritis    Spondylosis without myelopathy or radiculopathy, 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   OSA treated with BiPAP 06/17/2013   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   Atrial fibrillation ([place]) 06/17/2013   Rosacea 06/17/2013   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   History of pulmonary embolus (PE) 12/05/1998   Social History   Tobacco Use   Smoking status: Former    Packs/day: 3.00    Years: 25.00    Pack years: 75.00    Types: Cigarettes    Quit date: 11/28/1984    Years since quitting: 36.9   Smokeless tobacco: Never  Substance Use Topics   Alcohol use: Yes    Alcohol/week: 1.0 standard drink    Types: 1 Glasses of wine per week    Comment: at times    Current Outpatient Medications:    apixaban (ELIQUIS) 5 MG TABS tablet, Take 1 tablet (5 mg total) by mouth 2 (two) times daily., Disp: 180 tablet, Rfl: 1   Apremilast (OTEZLA PO), Take 30 mg by mouth 2 (two) times daily., Disp: , Rfl:    Ascorbic Acid (VITAMIN C) 500 MG CAPS, Take 1 capsule by mouth daily., Disp: , Rfl:    Atropine Sulfate-NaCl 0.01-0.9 % SOLN, Apply  1 drop to eye at bedtime. , Disp: , Rfl:    Cholecalciferol (VITAMIN D3) 3000 units TABS, Take 1 tablet by mouth daily., Disp: , Rfl:    clobetasol (TEMOVATE) 0.05 % external solution, APPLY TO AFFECTED AREA(S)  TOPICALLY ON SCALP 1 TO 2  TIMES DAILY AS NEEDED, Disp: 150 mL, Rfl: 3   desonide (DESOWEN) 0.05 % lotion, Apply 1 application topically as needed., Disp: , Rfl:    docusate sodium (COLACE) 100 MG capsule, Take 100 mg by mouth daily. , Disp: , Rfl:    doxycycline (VIBRA-TABS) 100 MG tablet, TAKE 1 TABLET BY MOUTH  TWICE DAILY, Disp: 180 tablet, Rfl: 1   erythromycin ophthalmic ointment, APP THIN LAYER IN  OS BID, Disp: , Rfl: 1   ipratropium-albuterol (DUONEB) 0.5-2.5 (3) MG/3ML SOLN, Take 3 mLs by nebulization every 6 (six) hours as needed., Disp: 360 mL, Rfl: 3   ketorolac (ACULAR) 0.5 % ophthalmic solution, Place 1 drop into the right eye 4 (four) times daily., Disp: , Rfl:    magnesium oxide (MAG-OX) 400 MG tablet, TAKE 1 TABLET BY MOUTH TWICE A DAY, Disp: 180 tablet, Rfl: 2   metoprolol succinate (TOPROL-XL) 25 MG 24 hr tablet, TAKE 1 TABLET BY MOUTH  DAILY, Disp: 90 tablet, Rfl: 3   mometasone (NASONEX) 50 MCG/ACT nasal spray, Place 2 sprays into the nose daily. (Patient taking differently: Place 2 sprays into the nose as needed.), Disp: 51 g, Rfl: 2   NARCAN 4 MG/0.1ML LIQD nasal spray kit, INSTILL ONE SPRAY PRN FOR ACCIDENTAL OVERDOSE, Disp: , Rfl: 0   nystatin ointment (MYCOSTATIN), Apply 1 application topically 2 (two) times daily., Disp: 30 g, Rfl: 1   oxyCODONE (OXY IR/ROXICODONE) 5 MG immediate release tablet, Take 5 mg by mouth 3 (three) times daily as needed. for pain, Disp: , Rfl: 0   prednisoLONE acetate (PRED FORTE) 1 % ophthalmic suspension, Place 1 drop into the left eye 2 (two) times daily. , Disp: , Rfl:    pregabalin (LYRICA) 50 MG capsule, Take 50 mg by mouth 2 (two) times daily., Disp: , Rfl:    spironolactone (ALDACTONE) 25 MG tablet, Take 25 mg by mouth daily., Disp: , Rfl:    tadalafil (CIALIS) 20 MG tablet, Take 0.5-1 tablets (10-20 mg total) by mouth every other day as needed for erectile dysfunction., Disp: 5 tablet, Rfl: 11   tamsulosin (FLOMAX) 0.4 MG CAPS capsule, TAKE 1 CAPSULE BY MOUTH AT  BEDTIME, Disp: 90 capsule, Rfl: 3   temazepam (RESTORIL) 15 MG capsule, TAKE 1 CAPSULE (15 MG TOTAL) BY MOUTH AT BEDTIME AS NEEDED FOR SLEEP., Disp: 90 capsule, Rfl: 1   torsemide (DEMADEX) 20 MG tablet, TAKE 2 TABLETS BY MOUTH IN  THE MORNING AND 1 TABLET BY MOUTH IN THE EVENING (Patient taking differently: TAKE 2 TABLETS BY MOUTH IN  THE MORNING AND 2 TABLETS BY MOUTH IN THE  EVENING), Disp: 270 tablet, Rfl: 3   TRIAMCINOLONE PO, Uses ointment as needed, Disp: , Rfl:  Allergies  Allergen Reactions   Other Itching    Cats: watery eyes, itching, sneezing    Assessment & Plan:   1. COVID-19   Patient was advised to go to the ER and wife was in agreement.  No orders of the defined types were placed in this encounter.  No orders of the defined types were placed in this encounter.   Maurice Reed, [place] 11/12/2021  Time spent with the patient: 6 minutes, spent in obtaining information about  his symptoms, reviewing his previous labs, evaluations, and treatments, counseling him about his condition (please see the discussed topics above), and developing a plan to further investigate it; he had a number of questions which I addressed.

## 2021-11-12 NOTE — ED Provider Notes (Signed)
[facility] EMERGENCY DEPT Provider Note   CSN: [id number] Arrival date & time: 11/12/21  1738     History Chief Complaint  Patient presents with   Shortness of Breath    Maurice Reed is a 80 y.o. male.  HPI  80 year old male with a history of arthritis, atrial fibrillation, rectal dysfunction, GERD, nephrolithiasis, obesity, pulmonary embolism, who presents to the emergency department today for evaluation of shortness of breath.  Patient is present with his family member at bedside who states that he started becoming symptomatic with COVID symptoms about 2 days ago.  He has had a sore throat, chest congestion, increased cough, increased shortness of breath clamminess, decreased p.o. intake and diarrhea.  He is typically on 3 L at rest and requires increased oxygen to 5 L with certain activities like showering and ambulation.  He has had low oxygen saturations at home and has had to have increasing oxygen needs due to persistent symptoms.  He had a video visit with his PA today who had advised him to be seen in the emergency department.  Past Medical History:  Diagnosis Date   Arthritis    Atrial fibrillation ([place])    Blind left eye 2005   was a result of cataract surgery   Diverticula, bladder    Erectile dysfunction    GERD (gastroesophageal reflux disease)    Kidney stones    Obesity    Pulmonary embolism ([place])     Patient Active Problem List   Diagnosis Date Noted   Pneumonia due to COVID-19 virus 11/12/2021   Secondary hypercoagulable state ([place]) 06/08/2021   Hyperlipidemia 01/27/2021   Mild aortic stenosis 01/27/2021   Chronic anticoagulation 04/16/2020   Chronic constipation 03/03/2020   Bilateral lower abdominal discomfort 03/03/2020   Chronic respiratory failure with hypoxia ([place]) 12/23/2019   Shortness of breath 12/23/2019   Healthcare maintenance 08/16/2019   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   Sensorineural hearing loss  (SNHL), 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 10/17/2018   Bilateral lower extremity edema 07/04/2018   Sclerosis of the skin 06/26/2018   ILD (interstitial lung disease) ([place]) 05/22/2018   Insomnia 05/22/2018   Erectile dysfunction 05/22/2018   Obesity    Kidney stones    Essential hypertension    GERD (gastroesophageal reflux disease)    Former smoker    Arthritis    Spondylosis without myelopathy or radiculopathy, 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   OSA treated with BiPAP 06/17/2013   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   Atrial fibrillation ([place]) 06/17/2013   Rosacea 06/17/2013   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   History of pulmonary embolus (PE) 12/05/1998    Past Surgical History:  Procedure Laterality Date   APPENDECTOMY  1963   LITHOTRIPSY  1987   NOSE SURGERY  2003   TONSILLECTOMY AND ADENOIDECTOMY  1497   UMBILICAL HERNIA REPAIR  1984       Family History  Problem Relation Age of Onset   Arthritis Mother    Hearing loss Mother    Hypertension Mother    Heart disease Mother    Stroke Mother    Arthritis Father    Hearing loss Father    Heart disease Father    Hypertension Father    Heart attack Father    Kidney disease Father    Stroke Father     Social History   Tobacco Use   Smoking status: Former  Packs/day: 3.00    Years: 25.00    Pack years: 75.00    Types: Cigarettes    Quit date: 11/28/1984    Years since quitting: 36.9   Smokeless tobacco: Never  Vaping Use   Vaping Use: Never used  Substance Use Topics   Alcohol use: Yes    Alcohol/week: 1.0 standard drink    Types: 1 Glasses of wine per week    Comment: at times   Drug use: Not Currently    Types: Marijuana    Comment: Gummies     Home Medications Prior to Admission medications   Medication Sig Start Date End Date Taking? Authorizing Provider  apixaban (ELIQUIS) 5 MG TABS tablet Take 1 tablet (5 mg  total) by mouth 2 (two) times daily. 09/27/21   Inda Coke, PA  Apremilast (OTEZLA PO) Take 30 mg by mouth 2 (two) times daily.    [provider]  Ascorbic Acid (VITAMIN C) 500 MG CAPS Take 1 capsule by mouth daily.    [provider]  Atropine Sulfate-NaCl 0.01-0.9 % SOLN Apply 1 drop to eye at bedtime.     [provider]  Cholecalciferol (VITAMIN D3) 3000 units TABS Take 1 tablet by mouth daily.    [provider]  clobetasol (TEMOVATE) 0.05 % external solution APPLY TO AFFECTED AREA(S)  TOPICALLY ON SCALP 1 TO 2  TIMES DAILY AS NEEDED 09/06/21   Inda Coke, PA  desonide (DESOWEN) 0.05 % lotion Apply 1 application topically as needed. 07/20/12   [provider]  docusate sodium (COLACE) 100 MG capsule Take 100 mg by mouth daily.     [provider]  doxycycline (VIBRA-TABS) 100 MG tablet TAKE 1 TABLET BY MOUTH  TWICE DAILY 06/21/21   Vivi Barrack, MD  erythromycin ophthalmic ointment APP THIN LAYER IN OS BID 05/01/18   [provider]  ipratropium-albuterol (DUONEB) 0.5-2.5 (3) MG/3ML SOLN Take 3 mLs by nebulization every 6 (six) hours as needed. 11/12/21   Inda Coke, PA  ketorolac (ACULAR) 0.5 % ophthalmic solution Place 1 drop into the right eye 4 (four) times daily. 03/18/21   [provider]  magnesium oxide (MAG-OX) 400 MG tablet TAKE 1 TABLET BY MOUTH TWICE A DAY 08/30/21   Inda Coke, PA  metoprolol succinate (TOPROL-XL) 25 MG 24 hr tablet TAKE 1 TABLET BY MOUTH  DAILY 03/17/21   Inda Coke, PA  mometasone (NASONEX) 50 MCG/ACT nasal spray Place 2 sprays into the nose daily. Patient taking differently: Place 2 sprays into the nose as needed. 03/05/19   Rigoberto Noel, MD  NARCAN 4 MG/0.1ML LIQD nasal spray kit INSTILL ONE SPRAY PRN FOR ACCIDENTAL OVERDOSE 08/22/18   [provider]  nystatin ointment (MYCOSTATIN) Apply 1 application topically 2 (two) times daily. 09/22/20   Inda Coke, PA  oxyCODONE (OXY IR/ROXICODONE) 5 MG immediate release tablet Take 5 mg by mouth 3 (three) times daily as needed. for pain 04/23/18   [provider]  prednisoLONE acetate (PRED FORTE) 1 % ophthalmic suspension Place 1 drop into the left eye 2 (two) times daily.  04/25/19   [provider]  pregabalin (LYRICA) 50 MG capsule Take 50 mg by mouth 2 (two) times daily.    Jeanella Anton, NP  spironolactone (ALDACTONE) 25 MG tablet Take 25 mg by mouth daily. 09/27/21   [provider]  tadalafil (CIALIS) 20 MG tablet Take 0.5-1 tablets (10-20 mg total) by mouth every other day as needed for erectile  dysfunction. 03/01/21   Inda Coke, PA  tamsulosin (FLOMAX) 0.4 MG CAPS capsule TAKE 1 CAPSULE BY MOUTH AT  BEDTIME 04/26/21   Inda Coke, PA  temazepam (RESTORIL) 15 MG capsule TAKE 1 CAPSULE (15 MG TOTAL) BY MOUTH AT BEDTIME AS NEEDED FOR SLEEP. 06/02/21   Vivi Barrack, MD  torsemide (DEMADEX) 20 MG tablet TAKE 2 TABLETS BY MOUTH IN  THE MORNING AND 1 TABLET BY MOUTH IN THE EVENING Patient taking differently: TAKE 2 TABLETS BY MOUTH IN  THE MORNING AND 2 TABLETS BY MOUTH IN THE EVENING 06/14/21   Lorretta Harp, MD  TRIAMCINOLONE PO Uses ointment as needed    [provider]    Allergies    Other  Review of Systems   Review of Systems  Constitutional:  Positive for chills.  HENT:  Positive for congestion and sore throat. Negative for ear pain.   Eyes:  Negative for visual disturbance.  Respiratory:  Positive for cough, chest tightness and shortness of breath.   Cardiovascular:  Negative for chest pain.  Gastrointestinal:  Positive for diarrhea. Negative for abdominal pain, constipation, nausea and vomiting.  Genitourinary:  Negative for dysuria and hematuria.  Musculoskeletal:  Negative for back pain.  Skin:  Negative for rash.  Neurological:  Negative for headaches.  All other systems reviewed and are negative.  Physical  Exam Updated Vital Signs BP 107/88   Pulse 99   Temp 98 F (36.7 C)   Resp (!) 27   Ht _0  (1.778 m)   Wt 122.5 kg   SpO2 100%   BMI 38.74 kg/m   Physical Exam Vitals and nursing note reviewed.  Constitutional:      General: He is not in acute distress.    Appearance: He is well-developed.  HENT:     Head: Normocephalic and atraumatic.  Eyes:     Conjunctiva/sclera: Conjunctivae normal.  Cardiovascular:     Heart sounds: No murmur heard.    Comments: Irregularly irregular Pulmonary:     Effort: Pulmonary effort is normal. No respiratory distress.     Breath sounds: Examination of the right-middle field reveals rales. Examination of the left-middle field reveals rales. Examination of the right-lower field reveals rales. Examination of the left-lower field reveals rales. Decreased breath sounds and rales present.  Abdominal:     Palpations: Abdomen is soft.     Tenderness: There is no abdominal tenderness.  Musculoskeletal:        General: No swelling.     Cervical back: Neck supple.     Comments: Trace ble edema (at baseline per caregiver)  Skin:    General: Skin is warm and dry.     Capillary Refill: Capillary refill takes less than 2 seconds.  Neurological:     Mental Status: He is alert.  Psychiatric:        Mood and Affect: Mood normal.    ED Results / Procedures / Treatments   Labs (all labs ordered are listed, but only abnormal results are displayed) Labs Reviewed  RESP PANEL BY RT-PCR (FLU A&B, COVID) ARPGX2 - Abnormal; Notable for the following components:      Result Value   SARS Coronavirus 2 by RT PCR POSITIVE (*)    All other components within normal limits  COMPREHENSIVE METABOLIC PANEL - Abnormal; Notable for the following components:   Chloride 96 (*)    CO2 37 (*)    All other components within normal limits  CBC WITH DIFFERENTIAL/PLATELET -  Abnormal; Notable for the following components:   RBC 3.75 (*)    Hemoglobin 12.7 (*)    MCV 105.9  (*)    All other components within normal limits  BRAIN NATRIURETIC PEPTIDE - Abnormal; Notable for the following components:   B Natriuretic Peptide 120.9 (*)    All other components within normal limits  CULTURE, BLOOD (ROUTINE X 2)  CULTURE, BLOOD (ROUTINE X 2)  PROCALCITONIN  PROCALCITONIN  LACTIC ACID, PLASMA  LACTIC ACID, PLASMA  D-DIMER, QUANTITATIVE  LACTATE DEHYDROGENASE  FERRITIN  TRIGLYCERIDES  FIBRINOGEN  C-REACTIVE PROTEIN  TROPONIN I (HIGH SENSITIVITY)  TROPONIN I (HIGH SENSITIVITY)    EKG None  Radiology DG Chest Portable 1 View  Result Date: 11/12/2021 CLINICAL DATA:  Shortness of breath.  COVID positive. EXAM: PORTABLE CHEST 1 VIEW COMPARISON:  Chest radiograph and CTA 12/17/2019 FINDINGS: The cardiac silhouette remains enlarged. Aortic atherosclerosis is noted. Extensive bilateral pleural calcification and pleural thickening are again seen. Coarse bilateral interstitial densities have increased from the prior study, and there are also scattered new patchy opacities bilaterally. No large pleural effusion or pneumothorax is identified. IMPRESSION: Increased bilateral lung opacities which may reflect pneumonia superimposed on chronic lung disease. Electronically Signed   By: Logan Bores M.D.   On: 11/12/2021 20:25    Procedures Procedures   Medications Ordered in ED Medications  cefTRIAXone (ROCEPHIN) 1 g in sodium chloride 0.9 % 100 mL IVPB (has no administration in time range)  azithromycin (ZITHROMAX) 500 mg in sodium chloride 0.9 % 250 mL IVPB (has no administration in time range)  remdesivir 100 mg in sodium chloride 0.9 % 100 mL IVPB (has no administration in time range)    Followed by  remdesivir 100 mg in sodium chloride 0.9 % 100 mL IVPB (has no administration in time range)  remdesivir 100 mg in sodium chloride 0.9 % 100 mL IVPB (has no administration in time range)  albuterol (VENTOLIN HFA) 108 (90 Base) MCG/ACT inhaler 2 puff (2 puffs Inhalation  Given 11/12/21 2020)  AeroChamber Plus Flo-Vu Large MISC 1 each (1 each Other Given 11/12/21 2020)    ED Course  I have reviewed the triage vital signs and the nursing notes.  Pertinent labs & imaging results that were available during my care of the patient were reviewed by me and considered in my medical decision making (see chart for details).    MDM Rules/Calculators/A&P                          80 y/o male presents for eval of sob. Recently dx with covid. On chronic o2 but has had increased requirement at home   Reviewed/interpreted labs  CBC with mild anemia CMP grossly unremarkable Trop neg BNP marginally elevated, doubt this is of clinical significant  EKG with afib, pvcs  Reviewed/interpreted imaging CXR -  Increased bilateral lung opacities which may reflect pneumonia superimposed on chronic lung disease.  Patient with significantly increased work of breathing with minimal activity here in the emergency department.  Concern for high risk of decompensation.  We will plan for admission.  Discussed with family at bedside he will initially states that they want the patient to be admitted to [facility].  Secretary discussed case with [facility] transfer line who stated that they had a very long wait for admissions and that they could not guarantee an admission within the next 24 hours to their hospitalist service.  I communicated this information to  the patient's wife at bedside who states that she is comfortable with the patient being admitted to [facility] instead if there is bed availability.  9:54 PM CONSULT with Dr. Hal Hope who accepts patient for admission to [facility]  Final Clinical Impression(s) / ED Diagnoses Final diagnoses:  COVID    Rx / DC Orders ED Discharge Orders     None        Bishop Dublin 11/12/21 2155    Davonna Belling, MD 11/14/21 [phone]

## 2021-11-12 NOTE — ED Notes (Signed)
Pt educated on proper use of MDI w/spacer. Pt able to perform w/out difficulty. Pt unable to take deep breath d/t ILD, COPD, Emphysema. Pt verbalized understanding of instructions on medication administration and importance of good deposition w/medication for better relief/decrease in symptoms. RT will continue to monitor.

## 2021-11-12 NOTE — Telephone Encounter (Signed)
Patient is scheduled for a work in.

## 2021-11-12 NOTE — Telephone Encounter (Signed)
Spoke to patient and he stated that he did not go to the ER on yesterday. He stated that so far he is feeling okay, he is resting.

## 2021-11-12 NOTE — ED Triage Notes (Signed)
Pt arrives pov with c/o dx for covid today, endorse increase shob, increase in o2 from 3 L to 5 L at home x 2 days. Home test covid+

## 2021-11-12 NOTE — Telephone Encounter (Signed)
Rx sent to the pharmacy. Patient notified and verbalized understanding. 

## 2021-11-13 ENCOUNTER — Other Ambulatory Visit: Payer: Self-pay

## 2021-11-13 DIAGNOSIS — U071 COVID-19: Secondary | ICD-10-CM | POA: Diagnosis not present

## 2021-11-13 DIAGNOSIS — J1282 Pneumonia due to coronavirus disease 2019: Secondary | ICD-10-CM

## 2021-11-13 LAB — CBC WITH DIFFERENTIAL/PLATELET
Abs Immature Granulocytes: 0.02 10*3/uL (ref 0.00–0.07)
Basophils Absolute: 0 10*3/uL (ref 0.0–0.1)
Basophils Relative: 0 %
Eosinophils Absolute: 0.1 10*3/uL (ref 0.0–0.5)
Eosinophils Relative: 1 %
HCT: 36.2 % — ABNORMAL LOW (ref 39.0–52.0)
Hemoglobin: 11.8 g/dL — ABNORMAL LOW (ref 13.0–17.0)
Immature Granulocytes: 0 %
Lymphocytes Relative: 25 %
Lymphs Abs: 1.2 10*3/uL (ref 0.7–4.0)
MCH: 34.5 pg — ABNORMAL HIGH (ref 26.0–34.0)
MCHC: 32.6 g/dL (ref 30.0–36.0)
MCV: 105.8 fL — ABNORMAL HIGH (ref 80.0–100.0)
Monocytes Absolute: 0.8 10*3/uL (ref 0.1–1.0)
Monocytes Relative: 17 %
Neutro Abs: 2.7 10*3/uL (ref 1.7–7.7)
Neutrophils Relative %: 57 %
Platelets: 170 10*3/uL (ref 150–400)
RBC: 3.42 MIL/uL — ABNORMAL LOW (ref 4.22–5.81)
RDW: 13 % (ref 11.5–15.5)
WBC: 4.9 10*3/uL (ref 4.0–10.5)
nRBC: 0 % (ref 0.0–0.2)

## 2021-11-13 LAB — COMPREHENSIVE METABOLIC PANEL
ALT: 11 U/L (ref 0–44)
AST: 18 U/L (ref 15–41)
Albumin: 3.4 g/dL — ABNORMAL LOW (ref 3.5–5.0)
Alkaline Phosphatase: 82 U/L (ref 38–126)
Anion gap: 5 (ref 5–15)
BUN: 10 mg/dL (ref 8–23)
CO2: 37 mmol/L — ABNORMAL HIGH (ref 22–32)
Calcium: 9.1 mg/dL (ref 8.9–10.3)
Chloride: 99 mmol/L (ref 98–111)
Creatinine, Ser: 0.78 mg/dL (ref 0.61–1.24)
GFR, Estimated: >60 mL/min (ref >60)
Glucose, Bld: 95 mg/dL (ref 70–99)
Potassium: 3.5 mmol/L (ref 3.5–5.1)
Sodium: 141 mmol/L (ref 135–145)
Total Bilirubin: 0.3 mg/dL (ref 0.3–1.2)
Total Protein: 6.4 g/dL — ABNORMAL LOW (ref 6.5–8.1)

## 2021-11-13 LAB — C DIFFICILE QUICK SCREEN W PCR REFLEX
C Diff antigen: NEGATIVE
C Diff interpretation: NEGATIVE
C Diff toxin: NEGATIVE

## 2021-11-13 LAB — TRIGLYCERIDES: Triglycerides: 116 mg/dL (ref <150)

## 2021-11-13 LAB — PROCALCITONIN
Procalcitonin: <0.1 ng/mL
Procalcitonin: <0.1 ng/mL

## 2021-11-13 LAB — C-REACTIVE PROTEIN: CRP: 1.7 mg/dL — ABNORMAL HIGH (ref <1.0)

## 2021-11-13 LAB — FERRITIN: Ferritin: 647 ng/mL — ABNORMAL HIGH (ref 24–336)

## 2021-11-13 MED ORDER — ORAL CARE MOUTH RINSE
15.0000 mL | Freq: Two times a day (BID) | OROMUCOSAL | Status: DC
  Administered 2021-11-13 – 2021-11-16 (×6): 15 mL via OROMUCOSAL

## 2021-11-13 MED ORDER — GUAIFENESIN-DM 100-10 MG/5ML PO SYRP: 10.0000 mL | ORAL_SOLUTION | ORAL | Status: DC | PRN

## 2021-11-13 MED ORDER — APREMILAST 30 MG PO TABS
30.0000 mg | ORAL_TABLET | Freq: Two times a day (BID) | ORAL | Status: DC
  Administered 2021-11-13 – 2021-11-16 (×4): 30 mg via ORAL

## 2021-11-13 MED ORDER — TAMSULOSIN HCL 0.4 MG PO CAPS
0.4000 mg | ORAL_CAPSULE | Freq: Every day | ORAL | Status: DC
  Administered 2021-11-13 – 2021-11-15 (×3): 0.4 mg via ORAL
  Filled 2021-11-13 (×3): qty 1

## 2021-11-13 MED ORDER — ERYTHROMYCIN 5 MG/GM OP OINT
1.0000 | TOPICAL_OINTMENT | Freq: Every day | OPHTHALMIC | Status: DC
Start: 2021-11-13 — End: 2021-11-16
  Filled 2021-11-13: qty 3.5
  Filled 2021-11-13: qty 1

## 2021-11-13 MED ORDER — METOPROLOL SUCCINATE ER 25 MG PO TB24
25.0000 mg | ORAL_TABLET | Freq: Every day | ORAL | Status: DC
  Administered 2021-11-13 – 2021-11-15 (×3): 25 mg via ORAL
  Filled 2021-11-13 (×3): qty 1

## 2021-11-13 MED ORDER — IPRATROPIUM-ALBUTEROL 20-100 MCG/ACT IN AERS
1.0000 | INHALATION_SPRAY | Freq: Four times a day (QID) | RESPIRATORY_TRACT | Status: DC
  Administered 2021-11-13 – 2021-11-16 (×11): 1 via RESPIRATORY_TRACT
  Filled 2021-11-13: qty 4

## 2021-11-13 MED ORDER — APIXABAN 5 MG PO TABS
5.0000 mg | ORAL_TABLET | Freq: Two times a day (BID) | ORAL | Status: DC
  Administered 2021-11-13 – 2021-11-16 (×6): 5 mg via ORAL
  Filled 2021-11-13 (×6): qty 1

## 2021-11-13 MED ORDER — KETOROLAC TROMETHAMINE 0.5 % OP SOLN
1.0000 [drp] | Freq: Four times a day (QID) | OPHTHALMIC | Status: DC | PRN
  Filled 2021-11-13: qty 5

## 2021-11-13 MED ORDER — SPIRONOLACTONE 25 MG PO TABS
25.0000 mg | ORAL_TABLET | Freq: Every day | ORAL | Status: DC
  Administered 2021-11-14 – 2021-11-16 (×3): 25 mg via ORAL
  Filled 2021-11-13 (×3): qty 1

## 2021-11-13 MED ORDER — CLOBETASOL PROPIONATE 0.05 % EX CREA
TOPICAL_CREAM | Freq: Two times a day (BID) | CUTANEOUS | Status: DC | PRN
  Filled 2021-11-13: qty 15

## 2021-11-13 MED ORDER — TEMAZEPAM 15 MG PO CAPS
15.0000 mg | ORAL_CAPSULE | Freq: Every evening | ORAL | Status: DC | PRN
  Administered 2021-11-13 – 2021-11-15 (×3): 15 mg via ORAL
  Filled 2021-11-13 (×4): qty 1

## 2021-11-13 MED ORDER — VITAMIN C 500 MG PO CAPS: 1.0000 | ORAL_CAPSULE | Freq: Every day | ORAL | Status: DC

## 2021-11-13 MED ORDER — VITAMIN D3 75 MCG (3000 UT) PO TABS: 1.0000 | ORAL_TABLET | Freq: Every day | ORAL | Status: DC

## 2021-11-13 MED ORDER — ACETAMINOPHEN 325 MG PO TABS: 650.0000 mg | ORAL_TABLET | Freq: Four times a day (QID) | ORAL | Status: DC | PRN

## 2021-11-13 MED ORDER — ATROPINE SULFATE 1 % OP SOLN
1.0000 [drp] | Freq: Every day | OPHTHALMIC | Status: DC
  Filled 2021-11-13: qty 2

## 2021-11-13 MED ORDER — LATANOPROST 0.005 % OP SOLN
1.0000 [drp] | Freq: Every day | OPHTHALMIC | Status: DC
  Filled 2021-11-13: qty 2.5

## 2021-11-13 MED ORDER — CHOLECALCIFEROL 10 MCG (400 UNIT) PO TABS
400.0000 [IU] | ORAL_TABLET | Freq: Every day | ORAL | Status: DC
  Administered 2021-11-15 – 2021-11-16 (×2): 400 [IU] via ORAL
  Filled 2021-11-13 (×3): qty 1

## 2021-11-13 MED ORDER — ASCORBIC ACID 500 MG PO TABS
500.0000 mg | ORAL_TABLET | Freq: Every day | ORAL | Status: DC
  Administered 2021-11-14 – 2021-11-16 (×3): 500 mg via ORAL
  Filled 2021-11-13 (×3): qty 1

## 2021-11-13 MED ORDER — PREGABALIN 50 MG PO CAPS
50.0000 mg | ORAL_CAPSULE | Freq: Two times a day (BID) | ORAL | Status: DC
  Administered 2021-11-13 – 2021-11-16 (×6): 50 mg via ORAL
  Filled 2021-11-13 (×6): qty 1

## 2021-11-13 MED ORDER — HYDROCOD POLST-CPM POLST ER 10-8 MG/5ML PO SUER: 5.0000 mL | Freq: Two times a day (BID) | ORAL | Status: DC | PRN

## 2021-11-13 MED ORDER — ZINC SULFATE 220 (50 ZN) MG PO CAPS
220.0000 mg | ORAL_CAPSULE | Freq: Every day | ORAL | Status: DC
  Administered 2021-11-13 – 2021-11-16 (×4): 220 mg via ORAL
  Filled 2021-11-13 (×4): qty 1

## 2021-11-13 MED ORDER — DOXYCYCLINE HYCLATE 100 MG PO TABS
100.0000 mg | ORAL_TABLET | Freq: Two times a day (BID) | ORAL | Status: DC
  Administered 2021-11-13 – 2021-11-16 (×6): 100 mg via ORAL
  Filled 2021-11-13 (×6): qty 1

## 2021-11-13 MED ORDER — DOCUSATE SODIUM 100 MG PO CAPS
100.0000 mg | ORAL_CAPSULE | Freq: Every day | ORAL | Status: DC
  Administered 2021-11-14 – 2021-11-16 (×3): 100 mg via ORAL
  Filled 2021-11-13 (×3): qty 1

## 2021-11-13 MED ORDER — PREDNISOLONE ACETATE 1 % OP SUSP
1.0000 [drp] | Freq: Every day | OPHTHALMIC | Status: DC
  Filled 2021-11-13: qty 5

## 2021-11-13 MED ORDER — FLUTICASONE PROPIONATE 50 MCG/ACT NA SUSP
2.0000 | Freq: Every day | NASAL | Status: DC
  Filled 2021-11-13: qty 16

## 2021-11-13 MED ORDER — OXYCODONE HCL 5 MG PO TABS: 5.0000 mg | ORAL_TABLET | Freq: Three times a day (TID) | ORAL | Status: DC | PRN

## 2021-11-13 MED ORDER — TORSEMIDE 20 MG PO TABS
40.0000 mg | ORAL_TABLET | Freq: Two times a day (BID) | ORAL | Status: DC
  Administered 2021-11-13 – 2021-11-16 (×6): 40 mg via ORAL
  Filled 2021-11-13 (×7): qty 2

## 2021-11-13 MED ORDER — DEXAMETHASONE SODIUM PHOSPHATE 10 MG/ML IJ SOLN
6.0000 mg | INTRAMUSCULAR | Status: DC
  Administered 2021-11-13 – 2021-11-15 (×3): 6 mg via INTRAVENOUS
  Filled 2021-11-13 (×3): qty 1

## 2021-11-13 NOTE — ED Notes (Signed)
Pt currently on home BIPAP machine w/3 Lpm bled into his machine. Pt able to place self on with help of spouse.

## 2021-11-13 NOTE — Plan of Care (Signed)
  Problem: Education: Goal: Knowledge of risk factors and measures for prevention of condition will improve Outcome: Progressing   Problem: Coping: Goal: Psychosocial and spiritual needs will be supported Outcome: Progressing   Problem: Respiratory: Goal: Will maintain a patent airway Outcome: Progressing Goal: Complications related to the disease process, condition or treatment will be avoided or minimized Outcome: Progressing   Problem: Education: Goal: Knowledge of General Education information will improve Description: Including pain rating scale, medication(s)/side effects and non-pharmacologic comfort measures Outcome: Progressing   Problem: Activity: Goal: Risk for activity intolerance will decrease Outcome: Progressing   Problem: Nutrition: Goal: Adequate nutrition will be maintained Outcome: Progressing   Problem: Elimination: Goal: Will not experience complications related to bowel motility Outcome: Progressing Goal: Will not experience complications related to urinary retention Outcome: Progressing   Problem: Pain Managment: Goal: General experience of comfort will improve Outcome: Progressing   Problem: Safety: Goal: Ability to remain free from injury will improve Outcome: Progressing

## 2021-11-13 NOTE — Progress Notes (Signed)
Pt placed on home cpap. Pt use 3L bled into the machine. Pt prefer self placement. Pt is resting well at this time and O2 sats of 100%.

## 2021-11-13 NOTE — ED Notes (Signed)
[facility] RT made aware of patient transfer and need for [facility] BiPAP.

## 2021-11-13 NOTE — ED Notes (Signed)
Patient on home BiPAP machine. Patient comfortable with O2 sats of 100%.

## 2021-11-13 NOTE — ED Notes (Signed)
Handoff report given to [facility] and to [employer] on 4W at [employer]

## 2021-11-13 NOTE — H&P (Signed)
History and Physical    Maurice Reed KCL:[id number] DOB: 10-17-1941 DOA: 11/12/2021  PCP: Inda Coke, PA  Patient coming from: Home  Chief Complaint: shortness of breath  HPI: Maurice Reed is a 80 y.o. male with medical history significant of a fib on eliquis, PE, GERD, chronic hypoxic respiratory failure on 3L [place]. Presenting with dyspnea. Symptoms started 3 days ago. Felt short of breath w/ chest congestion, cough and clamminess. He was noted to have some decreased PO intake. His wife had COVID, so she was concerned for him and had him tested. He was COVID positive. He had need for increased O2 use yesterday. They spoke with their PCP who recommended that he come to the ED for evaluations.   ED Course: CXR showed increased bilateral lung opacities which may reflect pneumonia superimposed on chronic lung disease. He was COVID positive. He was started on remdesivir, rocephin, and azithro. TRH was called for admission.   Review of Systems:  Review of systems is otherwise negative for all not mentioned in HPI.   PMHx Past Medical History:  Diagnosis Date   Arthritis    Atrial fibrillation ([place])    Blind left eye 2005   was a result of cataract surgery   Diverticula, bladder    Erectile dysfunction    GERD (gastroesophageal reflux disease)    Kidney stones    Obesity    Pulmonary embolism ([place])     PSHx Past Surgical History:  Procedure Laterality Date   [place]   NOSE SURGERY  2003   TONSILLECTOMY AND ADENOIDECTOMY  4944   UMBILICAL HERNIA REPAIR  1984    SocHx  reports that he quit smoking about 36 years ago. His smoking use included cigarettes. He has a 75.00 pack-year smoking history. He has never used smokeless tobacco. He reports current alcohol use of about 1.0 standard drink per week. He reports that he does not currently use drugs after having used the following drugs: Marijuana.  Allergies  Allergen Reactions   Other Itching     Cats: watery eyes, itching, sneezing    FamHx Family History  Problem Relation Age of Onset   Arthritis Mother    Hearing loss Mother    Hypertension Mother    Heart disease Mother    Stroke Mother    Arthritis Father    Hearing loss Father    Heart disease Father    Hypertension Father    Heart attack Father    Kidney disease Father    Stroke Father     Prior to Admission medications   Medication Sig Start Date End Date Taking? Authorizing Provider  apixaban (ELIQUIS) 5 MG TABS tablet Take 1 tablet (5 mg total) by mouth 2 (two) times daily. 09/27/21   Inda Coke, PA  Apremilast (OTEZLA PO) Take 30 mg by mouth 2 (two) times daily.    [provider]  Ascorbic Acid (VITAMIN C) 500 MG CAPS Take 1 capsule by mouth daily.    [provider]  Atropine Sulfate-NaCl 0.01-0.9 % SOLN Apply 1 drop to eye at bedtime.     [provider]  Cholecalciferol (VITAMIN D3) 3000 units TABS Take 1 tablet by mouth daily.    [provider]  clobetasol (TEMOVATE) 0.05 % external solution APPLY TO AFFECTED AREA(S)  TOPICALLY ON SCALP 1 TO 2  TIMES DAILY AS NEEDED 09/06/21   Inda Coke, PA  desonide (DESOWEN) 0.05 % lotion Apply 1  application topically as needed. 07/20/12   [provider]  docusate sodium (COLACE) 100 MG capsule Take 100 mg by mouth daily.     [provider]  doxycycline (VIBRA-TABS) 100 MG tablet TAKE 1 TABLET BY MOUTH  TWICE DAILY 06/21/21   Vivi Barrack, MD  erythromycin ophthalmic ointment APP THIN LAYER IN OS BID 05/01/18   [provider]  ipratropium-albuterol (DUONEB) 0.5-2.5 (3) MG/3ML SOLN Take 3 mLs by nebulization every 6 (six) hours as needed. 11/12/21   Inda Coke, PA  ketorolac (ACULAR) 0.5 % ophthalmic solution Place 1 drop into the right eye 4 (four) times daily. 03/18/21   [provider]  magnesium oxide (MAG-OX) 400 MG tablet TAKE 1 TABLET BY MOUTH TWICE A DAY 08/30/21   Inda Coke, PA  metoprolol succinate (TOPROL-XL) 25 MG 24 hr tablet TAKE 1 TABLET BY MOUTH  DAILY 03/17/21   Inda Coke, PA  mometasone (NASONEX) 50 MCG/ACT nasal spray Place 2 sprays into the nose daily. Patient taking differently: Place 2 sprays into the nose as needed. 03/05/19   Rigoberto Noel, MD  NARCAN 4 MG/0.1ML LIQD nasal spray kit INSTILL ONE SPRAY PRN FOR ACCIDENTAL OVERDOSE 08/22/18   [provider]  nystatin ointment (MYCOSTATIN) Apply 1 application topically 2 (two) times daily. 09/22/20   Inda Coke, PA  oxyCODONE (OXY IR/ROXICODONE) 5 MG immediate release tablet Take 5 mg by mouth 3 (three) times daily as needed. for pain 04/23/18   [provider]  prednisoLONE acetate (PRED FORTE) 1 % ophthalmic suspension Place 1 drop into the left eye 2 (two) times daily.  04/25/19   [provider]  pregabalin (LYRICA) 50 MG capsule Take 50 mg by mouth 2 (two) times daily.    Jeanella Anton, NP  spironolactone (ALDACTONE) 25 MG tablet Take 25 mg by mouth daily. 09/27/21   [provider]  tadalafil (CIALIS) 20 MG tablet Take 0.5-1 tablets (10-20 mg total) by mouth every other day as needed for erectile dysfunction. 03/01/21   Inda Coke, PA  tamsulosin (FLOMAX) 0.4 MG CAPS capsule TAKE 1 CAPSULE BY MOUTH AT  BEDTIME 04/26/21   Inda Coke, PA  temazepam (RESTORIL) 15 MG capsule TAKE 1 CAPSULE (15 MG TOTAL) BY MOUTH AT BEDTIME AS NEEDED FOR SLEEP. 06/02/21   Vivi Barrack, MD  torsemide (DEMADEX) 20 MG tablet TAKE 2 TABLETS BY MOUTH IN  THE MORNING AND 1 TABLET BY MOUTH IN THE EVENING Patient taking differently: TAKE 2 TABLETS BY MOUTH IN  THE MORNING AND 2 TABLETS BY MOUTH IN THE EVENING 06/14/21   Lorretta Harp, MD  TRIAMCINOLONE PO Uses ointment as needed    [provider]    Physical Exam: Vitals:   11/13/21 1200 11/13/21 1231 11/13/21 1415 11/13/21 1442  BP: (!) 155/108 (!) 155/108 128/78   Pulse: (!) 119 (!) 105 (!)  120   Resp: (!) 33 (!) 26 (!) 24   Temp:  98.8 F (37.1 C) 98.2 F (36.8 C)   TempSrc:  Oral Oral   SpO2: 96% 99% 99%   Weight:    121.7 kg  Height:    '5\' 11"'  (1.803 m)    General: 80 y.o. male resting in bed in NAD Eyes: PERRL, normal sclera ENMT: Nares patent w/o discharge, orophaynx clear, dentition normal, ears w/o discharge/lesions/ulcers Neck: Supple, trachea midline Cardiovascular: RRR, +S1, S2, no m/g/r, equal pulses throughout Respiratory: decreased at bases, normal WOB GI: BS+, NDNT, no masses noted, no  organomegaly noted MSK: No e/c/c Neuro: A&O x 3, no focal deficits, HoH Psyc: Appropriate interaction and affect, calm/cooperative  Labs on Admission: I have personally reviewed following labs and imaging studies  CBC: Recent Labs  Lab 11/12/21 1931 11/13/21 0736  WBC 4.2 4.9  NEUTROABS 1.8 2.7  HGB 12.7* 11.8*  HCT 39.7 36.2*  MCV 105.9* 105.8*  PLT 182 742   Basic Metabolic Panel: Recent Labs  Lab 11/12/21 1931 11/13/21 0736  NA 140 141  K 3.5 3.5  CL 96* 99  CO2 37* 37*  GLUCOSE 90 95  BUN 12 10  CREATININE 0.91 0.78  CALCIUM 9.6 9.1   GFR: Estimated Creatinine Clearance: 97.8 mL/min (by C-G formula based on SCr of 0.78 mg/dL). Liver Function Tests: Recent Labs  Lab 11/12/21 1931 11/13/21 0736  AST 19 18  ALT 12 11  ALKPHOS 86 82  BILITOT 0.3 0.3  PROT 6.9 6.4*  ALBUMIN 3.6 3.4*   No results for input(s): LIPASE, AMYLASE in the last 168 hours. No results for input(s): AMMONIA in the last 168 hours. Coagulation Profile: No results for input(s): INR, PROTIME in the last 168 hours. Cardiac Enzymes: No results for input(s): CKTOTAL, CKMB, CKMBINDEX, TROPONINI in the last 168 hours. BNP (last 3 results) No results for input(s): PROBNP in the last 8760 hours. HbA1C: No results for input(s): HGBA1C in the last 72 hours. CBG: No results for input(s): GLUCAP in the last 168 hours. Lipid Profile: Recent Labs    11/12/21 2226  TRIG 116    Thyroid Function Tests: No results for input(s): TSH, T4TOTAL, FREET4, T3FREE, THYROIDAB in the last 72 hours. Anemia Panel: Recent Labs    11/12/21 2226  FERRITIN 647*   Urine analysis:    Component Value Date/Time   BILIRUBINUR Negative 09/08/2020 1550   PROTEINUR Negative 09/08/2020 1550   UROBILINOGEN 0.2 09/08/2020 1550   NITRITE Negative 09/08/2020 1550   LEUKOCYTESUR Moderate (2+) (A) 09/08/2020 1550    Radiological Exams on Admission: DG Chest Portable 1 View  Result Date: 11/12/2021 CLINICAL DATA:  Shortness of breath.  COVID positive. EXAM: PORTABLE CHEST 1 VIEW COMPARISON:  Chest radiograph and CTA 12/17/2019 FINDINGS: The cardiac silhouette remains enlarged. Aortic atherosclerosis is noted. Extensive bilateral pleural calcification and pleural thickening are again seen. Coarse bilateral interstitial densities have increased from the prior study, and there are also scattered new patchy opacities bilaterally. No large pleural effusion or pneumothorax is identified. IMPRESSION: Increased bilateral lung opacities which may reflect pneumonia superimposed on chronic lung disease. Electronically Signed   By: Logan Bores M.D.   On: 11/12/2021 20:25    EKG: Independently reviewed. A fib, no st elevations  Assessment/Plan COVID 19 PNA     - placed in obs, progressive     - continue remdesivir, steroids, nebs     - guaifenesin, IS, flutter     - procal is negative x2; stop abx     - continue following inflammatory markers  A fib on eliquis     - continue home regimen including eliquis  COPD ILD     - nebs, O2 as above  HTN HFpEF     - continue home BB, diuretics  Chronic pain     - continue home regimen  Seborrheic dermatitis Rosacea     - continue otezla and doxy   DVT prophylaxis: eliquis  Code Status: DNR  Family Communication: w/ wife at bedside  Consults called: None   Status is: Observation  The  patient remains OBS appropriate and will d/c before  2 midnights.  Jonnie Finner DO [facility]  If 7PM-7AM, please contact night-coverage [email or web address]  11/13/2021, 2:56 PM

## 2021-11-13 NOTE — ED Notes (Signed)
Pt placed on BiPap provided from home to sleep

## 2021-11-14 DIAGNOSIS — U071 COVID-19: Secondary | ICD-10-CM | POA: Diagnosis present

## 2021-11-14 DIAGNOSIS — I5032 Chronic diastolic (congestive) heart failure: Secondary | ICD-10-CM | POA: Diagnosis present

## 2021-11-14 DIAGNOSIS — Z79899 Other long term (current) drug therapy: Secondary | ICD-10-CM | POA: Diagnosis not present

## 2021-11-14 DIAGNOSIS — G894 Chronic pain syndrome: Secondary | ICD-10-CM | POA: Diagnosis present

## 2021-11-14 DIAGNOSIS — J1282 Pneumonia due to coronavirus disease 2019: Secondary | ICD-10-CM | POA: Diagnosis present

## 2021-11-14 DIAGNOSIS — Z7901 Long term (current) use of anticoagulants: Secondary | ICD-10-CM | POA: Diagnosis not present

## 2021-11-14 DIAGNOSIS — L719 Rosacea, unspecified: Secondary | ICD-10-CM | POA: Diagnosis present

## 2021-11-14 DIAGNOSIS — H5462 Unqualified visual loss, left eye, normal vision right eye: Secondary | ICD-10-CM | POA: Diagnosis present

## 2021-11-14 DIAGNOSIS — I4821 Permanent atrial fibrillation: Secondary | ICD-10-CM | POA: Diagnosis not present

## 2021-11-14 DIAGNOSIS — I110 Hypertensive heart disease with heart failure: Secondary | ICD-10-CM | POA: Diagnosis present

## 2021-11-14 DIAGNOSIS — Z86711 Personal history of pulmonary embolism: Secondary | ICD-10-CM | POA: Diagnosis not present

## 2021-11-14 DIAGNOSIS — Z823 Family history of stroke: Secondary | ICD-10-CM | POA: Diagnosis not present

## 2021-11-14 DIAGNOSIS — Z841 Family history of disorders of kidney and ureter: Secondary | ICD-10-CM | POA: Diagnosis not present

## 2021-11-14 DIAGNOSIS — Z87891 Personal history of nicotine dependence: Secondary | ICD-10-CM | POA: Diagnosis not present

## 2021-11-14 DIAGNOSIS — L219 Seborrheic dermatitis, unspecified: Secondary | ICD-10-CM | POA: Diagnosis present

## 2021-11-14 DIAGNOSIS — Z6838 Body mass index (BMI) 38.0-38.9, adult: Secondary | ICD-10-CM | POA: Diagnosis not present

## 2021-11-14 DIAGNOSIS — I4819 Other persistent atrial fibrillation: Secondary | ICD-10-CM | POA: Diagnosis present

## 2021-11-14 DIAGNOSIS — J9621 Acute and chronic respiratory failure with hypoxia: Secondary | ICD-10-CM | POA: Diagnosis present

## 2021-11-14 DIAGNOSIS — Z8261 Family history of arthritis: Secondary | ICD-10-CM | POA: Diagnosis not present

## 2021-11-14 DIAGNOSIS — K219 Gastro-esophageal reflux disease without esophagitis: Secondary | ICD-10-CM | POA: Diagnosis present

## 2021-11-14 DIAGNOSIS — Z66 Do not resuscitate: Secondary | ICD-10-CM | POA: Diagnosis present

## 2021-11-14 DIAGNOSIS — E669 Obesity, unspecified: Secondary | ICD-10-CM | POA: Diagnosis present

## 2021-11-14 DIAGNOSIS — R55 Syncope and collapse: Secondary | ICD-10-CM | POA: Diagnosis not present

## 2021-11-14 DIAGNOSIS — J440 Chronic obstructive pulmonary disease with acute lower respiratory infection: Secondary | ICD-10-CM | POA: Diagnosis present

## 2021-11-14 DIAGNOSIS — Z8249 Family history of ischemic heart disease and other diseases of the circulatory system: Secondary | ICD-10-CM | POA: Diagnosis not present

## 2021-11-14 LAB — CBC WITH DIFFERENTIAL/PLATELET
Abs Immature Granulocytes: 0.01 10*3/uL (ref 0.00–0.07)
Basophils Absolute: 0 10*3/uL (ref 0.0–0.1)
Basophils Relative: 0 %
Eosinophils Absolute: 0 10*3/uL (ref 0.0–0.5)
Eosinophils Relative: 0 %
HCT: 40.9 % (ref 39.0–52.0)
Hemoglobin: 13.8 g/dL (ref 13.0–17.0)
Immature Granulocytes: 0 %
Lymphocytes Relative: 27 %
Lymphs Abs: 0.8 10*3/uL (ref 0.7–4.0)
MCH: 34.6 pg — ABNORMAL HIGH (ref 26.0–34.0)
MCHC: 33.7 g/dL (ref 30.0–36.0)
MCV: 102.5 fL — ABNORMAL HIGH (ref 80.0–100.0)
Monocytes Absolute: 0.2 10*3/uL (ref 0.1–1.0)
Monocytes Relative: 8 %
Neutro Abs: 2 10*3/uL (ref 1.7–7.7)
Neutrophils Relative %: 65 %
Platelets: 133 10*3/uL — ABNORMAL LOW (ref 150–400)
RBC: 3.99 MIL/uL — ABNORMAL LOW (ref 4.22–5.81)
RDW: 12.4 % (ref 11.5–15.5)
WBC: 3 10*3/uL — ABNORMAL LOW (ref 4.0–10.5)
nRBC: 0 % (ref 0.0–0.2)

## 2021-11-14 LAB — PROCALCITONIN: Procalcitonin: <0.1 ng/mL

## 2021-11-14 LAB — COMPREHENSIVE METABOLIC PANEL WITH GFR
ALT: 15 U/L (ref 0–44)
AST: 22 U/L (ref 15–41)
Albumin: 3.4 g/dL — ABNORMAL LOW (ref 3.5–5.0)
Alkaline Phosphatase: 89 U/L (ref 38–126)
Anion gap: 10 (ref 5–15)
BUN: 16 mg/dL (ref 8–23)
CO2: 33 mmol/L — ABNORMAL HIGH (ref 22–32)
Calcium: 8.6 mg/dL — ABNORMAL LOW (ref 8.9–10.3)
Chloride: 96 mmol/L — ABNORMAL LOW (ref 98–111)
Creatinine, Ser: 0.88 mg/dL (ref 0.61–1.24)
GFR, Estimated: >60 mL/min
Glucose, Bld: 129 mg/dL — ABNORMAL HIGH (ref 70–99)
Potassium: 3.8 mmol/L (ref 3.5–5.1)
Sodium: 139 mmol/L (ref 135–145)
Total Bilirubin: 0.5 mg/dL (ref 0.3–1.2)
Total Protein: 6.9 g/dL (ref 6.5–8.1)

## 2021-11-14 LAB — C-REACTIVE PROTEIN: CRP: 2.4 mg/dL — ABNORMAL HIGH (ref <1.0)

## 2021-11-14 LAB — FERRITIN: Ferritin: 738 ng/mL — ABNORMAL HIGH (ref 24–336)

## 2021-11-14 LAB — D-DIMER, QUANTITATIVE: D-Dimer, Quant: 1.25 ug/mL-FEU — ABNORMAL HIGH (ref 0.00–0.50)

## 2021-11-14 NOTE — Progress Notes (Signed)
PROGRESS NOTE    Maurice Reed  [id number] DOB: 1941/08/10 DOA: 11/12/2021 PCP: Inda Coke, PA    Brief Narrative:  This 81 years old male with PMH significant for [name] on Eliquis, history of PE, GERD, chronic hypoxic respiratory failure on 3 L of supplemental oxygen at baseline, presented with c/o: cough and worsening shortness of breath for 3 days.  Patient also reports having chest congestion,  clamminess associated with decreased p.o. intake.  He reports his wife was COVID-positive so he was concerned and he wanted him to be tested.  his COVID test is positive.  Eventually he has increased oxygen use which made to talk to his PCP who advised him to go to the ED.  Chest x-ray shows increased bilateral lung opacities which may reflect pneumonia superimposed on chronic lung disease.  Patient is started on remdesivir,  Rocephin and Zithromax.  Assessment & Plan:   Principal Problem:   Pneumonia due to COVID-19 virus  COVID-19 pneumonia: Patient presented with cough, chest congestion,  worsening shortness of breath. He was hypoxic requiring 4 L of oxygen on arrival. Now he is weaned down to his baseline oxygen requirement@3l /min Continue remdesivir, steroids and nebulization Continue guanfacine, encouraged incentive spirometry and flutter Procalcitonin negative x 2 so we will stop antibiotics. Continue to monitor inflammatory markers. Patient continues to have ongoing dyspnea on exertion.  Atrial fibrillation: Heart rate controlled, continue metoprolol. Continue Eliquis.  COPD: Continue home inhalers and nebulization, Continue supplemental oxygen.  Chronic pain syndrome: Continue oxycodone, Lyrica  HFp EF: Continue torsemide, metoprolol, Aldactone  Seborrheic dermatitis Rosacea: Continue Otezla and doxycycline    DVT prophylaxis: Eliquis Code Status: DNR Family Communication: Wife at bedside Disposition Plan:   Status is: Observation  The patient remains  OBS appropriate and will d/c before 2 midnights.  Admitted for acute on chronic hypoxic respiratory failure secondary to COVID pneumonia.  Patient has increased oxygen requirement requiring 3 to 4 L.    Consultants:  None  Procedures: None Antimicrobials:   Anti-infectives (From admission, onward)    Start     Dose/Rate Route Frequency Ordered Stop   11/13/21 2200  doxycycline (VIBRA-TABS) tablet 100 mg        100 mg Oral 2 times daily 11/13/21 1754     11/13/21 1000  remdesivir 100 mg in sodium chloride 0.9 % 100 mL IVPB        100 mg 200 mL/hr over 30 Minutes Intravenous Daily 11/12/21 2154 11/17/21 0959   11/12/21 2230  remdesivir 100 mg in sodium chloride 0.9 % 100 mL IVPB       See Hyperspace for full Linked Orders Report.   100 mg 200 mL/hr over 30 Minutes Intravenous  Once 11/12/21 2154 11/13/21 0404   11/12/21 2200  cefTRIAXone (ROCEPHIN) 1 g in sodium chloride 0.9 % 100 mL IVPB        1 g 200 mL/hr over 30 Minutes Intravenous  Once 11/12/21 2152 11/12/21 2335   11/12/21 2200  azithromycin (ZITHROMAX) 500 mg in sodium chloride 0.9 % 250 mL IVPB        500 mg 250 mL/hr over 60 Minutes Intravenous  Once 11/12/21 2152 11/13/21 0058   11/12/21 2200  remdesivir 100 mg in sodium chloride 0.9 % 100 mL IVPB       See Hyperspace for full Linked Orders Report.   100 mg 200 mL/hr over 30 Minutes Intravenous  Once 11/12/21 2154 11/13/21 0120  Subjective: Patient was seen and examined at bedside.  Overnight events noted.  Patient was sitting comfortably on the side of the bed.  Patient reports he is feeling better,  he is at his baseline oxygen requirement of 3 L/min.  He reports having ongoing shortness of breath with exertion.  Objective: Vitals:   11/13/21 2218 11/14/21 0215 11/14/21 0540 11/14/21 0800  BP:  (!) 144/94 138/77   Pulse: 97 73 79   Resp: 16 16 18    Temp:  (!) 97.5 F (36.4 C) 97.7 F (36.5 C)   TempSrc:  Oral Oral   SpO2: 100% 98% 98% 98%  Weight:       Height:        Intake/Output Summary (Last 24 hours) at 11/14/2021 1415 Last data filed at 11/14/2021 1411 Gross per 24 hour  Intake 703.87 ml  Output 2050 ml  Net -1346.13 ml   Filed Weights   11/12/21 1843 11/13/21 1442  Weight: 122.5 kg 121.7 kg    Examination:  General exam: Appears comfortable, not in any acute distress, deconditioned Respiratory system: Clear to auscultation bilaterally. Respiratory effort normal.  RR 12. Cardiovascular system: S1 & S2 heard, irregular rhythm, no murmur. Gastrointestinal system: Abdomen is soft, nontender, nondistended, BS+ Central nervous system: Alert and oriented x 3. No focal neurological deficits. Extremities: No edema, no cyanosis, no clubbing. Skin: No rashes, lesions or ulcers Psychiatry: Judgement and insight appear normal. Mood & affect appropriate.     Data Reviewed: I have personally reviewed following labs and imaging studies  CBC: Recent Labs  Lab 11/12/21 1931 11/13/21 0736 11/14/21 0405  WBC 4.2 4.9 3.0*  NEUTROABS 1.8 2.7 2.0  HGB 12.7* 11.8* 13.8  HCT 39.7 36.2* 40.9  MCV 105.9* 105.8* 102.5*  PLT 182 170 [id number]*   Basic Metabolic Panel: Recent Labs  Lab 11/12/21 1931 11/13/21 0736 11/14/21 0405  NA 140 141 139  K 3.5 3.5 3.8  CL 96* 99 96*  CO2 37* 37* 33*  GLUCOSE 90 95 129*  BUN 12 10 16   CREATININE 0.91 0.78 0.88  CALCIUM 9.6 9.1 8.6*   GFR: Estimated Creatinine Clearance: 88.9 mL/min (by C-G formula based on SCr of 0.88 mg/dL). Liver Function Tests: Recent Labs  Lab 11/12/21 1931 11/13/21 0736 11/14/21 0405  AST 19 18 22   ALT 12 11 15   ALKPHOS 86 82 89  BILITOT 0.3 0.3 0.5  PROT 6.9 6.4* 6.9  ALBUMIN 3.6 3.4* 3.4*   No results for input(s): LIPASE, AMYLASE in the last 168 hours. No results for input(s): AMMONIA in the last 168 hours. Coagulation Profile: No results for input(s): INR, PROTIME in the last 168 hours. Cardiac Enzymes: No results for input(s): CKTOTAL, CKMB,  CKMBINDEX, TROPONINI in the last 168 hours. BNP (last 3 results) No results for input(s): PROBNP in the last 8760 hours. HbA1C: No results for input(s): HGBA1C in the last 72 hours. CBG: No results for input(s): GLUCAP in the last 168 hours. Lipid Profile: Recent Labs    11/12/21 2226  TRIG 116   Thyroid Function Tests: No results for input(s): TSH, T4TOTAL, FREET4, T3FREE, THYROIDAB in the last 72 hours. Anemia Panel: Recent Labs    11/12/21 2226 11/14/21 0405  FERRITIN 647* 738*   Sepsis Labs: Recent Labs  Lab 11/12/21 2226 11/13/21 0525 11/14/21 0405  PROCALCITON <0.10 <0.10 <0.10  LATICACIDVEN 1.1  --   --     Recent Results (from the past 240 hour(s))  Resp Panel by RT-PCR (Flu  A&B, Covid) Nasopharyngeal Swab     Status: Abnormal   Collection Time: 11/12/21  6:51 PM   Specimen: Nasopharyngeal Swab; Nasopharyngeal(NP) swabs in vial transport medium  Result Value Ref Range Status   SARS Coronavirus 2 by RT PCR POSITIVE (A) NEGATIVE Final    Comment: RESULT CALLED TO, READ BACK BY AND VERIFIED WITH: jaimie oneal, rn 11/12/21 1937 src (NOTE) SARS-CoV-2 target nucleic acids are DETECTED.  The SARS-CoV-2 RNA is generally detectable in upper respiratory specimens during the acute phase of infection. Positive results are indicative of the presence of the identified virus, but do not rule out bacterial infection or co-infection with other pathogens not detected by the test. Clinical correlation with patient history and other diagnostic information is necessary to determine patient infection status. The expected result is Negative.  Fact Sheet for Patients: BloggerCourse.com  Fact Sheet for Healthcare Providers: SeriousBroker.it  This test is not yet approved or cleared by the [place] FDA and  has been authorized for detection and/or diagnosis of SARS-CoV-2 by FDA under an Emergency Use Authorization (EUA).   This EUA will remain in effect (meaning this test can be  used) for the duration of  the COVID-19 declaration under Section 564(b)(1) of the Act, 21 U.S.C. section 360bbb-3(b)(1), unless the authorization is terminated or revoked sooner.     Influenza A by PCR NEGATIVE NEGATIVE Final   Influenza B by PCR NEGATIVE NEGATIVE Final    Comment: (NOTE) The Xpert Xpress SARS-CoV-2/FLU/RSV plus assay is intended as an aid in the diagnosis of influenza from Nasopharyngeal swab specimens and should not be used as a sole basis for treatment. Nasal washings and aspirates are unacceptable for Xpert Xpress SARS-CoV-2/FLU/RSV testing.  Fact Sheet for Patients: BloggerCourse.com  Fact Sheet for Healthcare Providers: SeriousBroker.it  This test is not yet approved or cleared by the [place] FDA and has been authorized for detection and/or diagnosis of SARS-CoV-2 by FDA under an Emergency Use Authorization (EUA). This EUA will remain in effect (meaning this test can be used) for the duration of the COVID-19 declaration under Section 564(b)(1) of the Act, 21 U.S.C. section 360bbb-3(b)(1), unless the authorization is terminated or revoked.  Performed at [facility], [address] [place], [place] [address]   Blood Culture (routine x 2)     Status: None (Preliminary result)   Collection Time: 11/13/21  2:12 PM   Specimen: BLOOD  Result Value Ref Range Status   Specimen Description   Final    BLOOD LEFT ANTECUBITAL Performed at [facility], [address], [place], [place] [address]    Special Requests   Final    BOTTLES DRAWN AEROBIC ONLY Blood Culture adequate volume Performed at [facility], [address] [place], [place] [address]    Culture   Final    NO GROWTH < 24 HOURS Performed at [facility], [address] [place], [place] [address]    Report Status  PENDING  Incomplete  Blood Culture (routine x 2)     Status: None (Preliminary result)   Collection Time: 11/13/21  2:12 PM   Specimen: BLOOD  Result Value Ref Range Status   Specimen Description   Final    BLOOD RIGHT ANTECUBITAL Performed at [facility], [address] [place], [place] [address]    Special Requests   Final    BOTTLES DRAWN AEROBIC ONLY Blood Culture adequate volume Performed at [facility], [address], [place], [place] [address]    Culture   Final    NO GROWTH < 24 HOURS Performed at [facility] [address], [place], [place] [address]    Report Status PENDING  Incomplete  C Difficile Quick Screen w PCR reflex     Status: None   Collection Time: 11/13/21  7:29 PM   Specimen: STOOL  Result Value Ref Range Status   C Diff antigen NEGATIVE NEGATIVE Final   C Diff toxin NEGATIVE NEGATIVE Final   C Diff interpretation NEGATIVE  Final    Comment: Performed at [facility], [place] [address] [place], [place] [address]    Radiology Studies: DG Chest Portable 1 View  Result Date: 11/12/2021 CLINICAL DATA:  Shortness of breath.  COVID positive. EXAM: PORTABLE CHEST 1 VIEW COMPARISON:  Chest radiograph and CTA 12/17/2019 FINDINGS: The cardiac silhouette remains enlarged. Aortic atherosclerosis is noted. Extensive bilateral pleural calcification and pleural thickening are again seen. Coarse bilateral interstitial densities have increased from the prior study, and there are also scattered new patchy opacities bilaterally. No large pleural effusion or pneumothorax is identified. IMPRESSION: Increased bilateral lung opacities which may reflect pneumonia superimposed on chronic lung disease. Electronically Signed   By: Logan Bores M.D.   On: 11/12/2021 20:25    Scheduled Meds:  apixaban  5 mg Oral BID   Apremilast  30 mg Oral BID   vitamin C  500 mg Oral Daily   atropine  1 drop Left Eye QHS    cholecalciferol  400 Units Oral Daily   dexamethasone (DECADRON) injection  6 mg Intravenous Q24H   docusate sodium  100 mg Oral Daily   doxycycline  100 mg Oral BID   erythromycin  1 application Both Eyes QHS   fluticasone  2 spray Each Nare Daily   Ipratropium-Albuterol  1 puff Inhalation Q6H   latanoprost  1 drop Right Eye QHS   mouth rinse  15 mL Mouth Rinse BID   metoprolol succinate  25 mg Oral Daily   prednisoLONE acetate  1 drop Left Eye QHS   pregabalin  50 mg Oral BID   spironolactone  25 mg Oral Daily   tamsulosin  0.4 mg Oral QHS   torsemide  40 mg Oral BID   zinc sulfate  220 mg Oral Daily   Continuous Infusions:  sodium chloride 10 mL/hr at 11/14/21 1042   remdesivir 100 mg in NS 100 mL 100 mg (11/14/21 1044)     LOS: 0 days    Time spent: 35 mins    Claborn Janusz, MD [facility]   If 7PM-7AM, please contact night-coverage

## 2021-11-14 NOTE — Plan of Care (Signed)
Patient maintains oxygen saturation in the mid to high 90s on 3 liters (which is his baseline), overall feels much better.  No diarrhea reported on 7 a to 7 p shift.  Wife at bedside.

## 2021-11-14 NOTE — Plan of Care (Signed)
  Problem: Education: Goal: Knowledge of risk factors and measures for prevention of condition will improve Outcome: Progressing   Problem: Coping: Goal: Psychosocial and spiritual needs will be supported Outcome: Progressing   Problem: Respiratory: Goal: Will maintain a patent airway Outcome: Progressing Goal: Complications related to the disease process, condition or treatment will be avoided or minimized Outcome: Progressing   Problem: Activity: Goal: Risk for activity intolerance will decrease Outcome: Progressing   Problem: Nutrition: Goal: Adequate nutrition will be maintained Outcome: Progressing   Problem: Elimination: Goal: Will not experience complications related to bowel motility Outcome: Progressing Goal: Will not experience complications related to urinary retention Outcome: Progressing   Problem: Pain Managment: Goal: General experience of comfort will improve Outcome: Progressing   Problem: Safety: Goal: Ability to remain free from injury will improve Outcome: Progressing

## 2021-11-15 ENCOUNTER — Telehealth: Payer: Self-pay

## 2021-11-15 LAB — CBC WITH DIFFERENTIAL/PLATELET
Abs Immature Granulocytes: 0.02 10*3/uL (ref 0.00–0.07)
Basophils Absolute: 0 10*3/uL (ref 0.0–0.1)
Basophils Relative: 0 %
Eosinophils Absolute: 0 10*3/uL (ref 0.0–0.5)
Eosinophils Relative: 0 %
HCT: 39 % (ref 39.0–52.0)
Hemoglobin: 13.1 g/dL (ref 13.0–17.0)
Immature Granulocytes: 1 %
Lymphocytes Relative: 27 %
Lymphs Abs: 1 10*3/uL (ref 0.7–4.0)
MCH: 34.1 pg — ABNORMAL HIGH (ref 26.0–34.0)
MCHC: 33.6 g/dL (ref 30.0–36.0)
MCV: 101.6 fL — ABNORMAL HIGH (ref 80.0–100.0)
Monocytes Absolute: 0.4 10*3/uL (ref 0.1–1.0)
Monocytes Relative: 12 %
Neutro Abs: 2.3 10*3/uL (ref 1.7–7.7)
Neutrophils Relative %: 60 %
Platelets: 211 10*3/uL (ref 150–400)
RBC: 3.84 MIL/uL — ABNORMAL LOW (ref 4.22–5.81)
RDW: 12.3 % (ref 11.5–15.5)
WBC: 3.7 10*3/uL — ABNORMAL LOW (ref 4.0–10.5)
nRBC: 0 % (ref 0.0–0.2)

## 2021-11-15 LAB — COMPREHENSIVE METABOLIC PANEL
ALT: 14 U/L (ref 0–44)
AST: 20 U/L (ref 15–41)
Albumin: 3.4 g/dL — ABNORMAL LOW (ref 3.5–5.0)
Alkaline Phosphatase: 80 U/L (ref 38–126)
Anion gap: 8 (ref 5–15)
BUN: 20 mg/dL (ref 8–23)
CO2: 35 mmol/L — ABNORMAL HIGH (ref 22–32)
Calcium: 8.8 mg/dL — ABNORMAL LOW (ref 8.9–10.3)
Chloride: 96 mmol/L — ABNORMAL LOW (ref 98–111)
Creatinine, Ser: 0.94 mg/dL (ref 0.61–1.24)
GFR, Estimated: >60 mL/min (ref >60)
Glucose, Bld: 139 mg/dL — ABNORMAL HIGH (ref 70–99)
Potassium: 3.3 mmol/L — ABNORMAL LOW (ref 3.5–5.1)
Sodium: 139 mmol/L (ref 135–145)
Total Bilirubin: 0.6 mg/dL (ref 0.3–1.2)
Total Protein: 6.8 g/dL (ref 6.5–8.1)

## 2021-11-15 LAB — GASTROINTESTINAL PANEL BY PCR, STOOL (REPLACES STOOL CULTURE)

## 2021-11-15 LAB — C-REACTIVE PROTEIN: CRP: 1.4 mg/dL — ABNORMAL HIGH (ref <1.0)

## 2021-11-15 LAB — FERRITIN: Ferritin: 756 ng/mL — ABNORMAL HIGH (ref 24–336)

## 2021-11-15 LAB — D-DIMER, QUANTITATIVE: D-Dimer, Quant: 0.79 ug/mL-FEU — ABNORMAL HIGH (ref 0.00–0.50)

## 2021-11-15 MED ORDER — POTASSIUM CHLORIDE 20 MEQ PO PACK
40.0000 meq | PACK | Freq: Once | ORAL | Status: AC
  Administered 2021-11-15: 40 meq via ORAL
  Filled 2021-11-15: qty 2

## 2021-11-15 NOTE — Telephone Encounter (Signed)
FYI, see message. 

## 2021-11-15 NOTE — Telephone Encounter (Signed)
Patient's wife is calling in stating that Maurice Reed tested positive for covid and was admitted to the hospital.

## 2021-11-15 NOTE — Progress Notes (Signed)
PROGRESS NOTE    Maurice Reed  NWG:[id number] DOB: 10/17/1941 DOA: 11/12/2021 PCP: Jarold Motto, PA    Brief Narrative:  This 80 years old male with PMH significant for [name] on Eliquis, history of PE, GERD, chronic hypoxic respiratory failure on 3 L of supplemental oxygen at baseline, presented with c/o: cough and worsening shortness of breath for 3 days.  Patient also reports having chest congestion,  clamminess associated with decreased p.o. intake.  He reports his wife was COVID-positive so he was concerned and he wanted him to be tested.  his COVID test is positive.  Eventually he has increased oxygen use which made to talk to his PCP who advised him to go to the ED.  Chest x-ray shows increased bilateral lung opacities which may reflect pneumonia superimposed on chronic lung disease.  Patient is started on remdesivir,  Rocephin and Zithromax.  Assessment & Plan:   Principal Problem:   Pneumonia due to COVID-19 virus  COVID-19 pneumonia: Patient presented with cough, chest congestion,  worsening shortness of breath. He was hypoxic requiring 4 L of oxygen on arrival. Now he is weaned down to his baseline oxygen requirement@3l /min Continue remdesivir x 4 days, steroids and nebulization Continue guanfacine, encouraged incentive spirometry and flutter Procalcitonin negative x 2 so we will stop antibiotics. Continue to monitor inflammatory markers. Patient continues to have ongoing dyspnea on exertion.  Atrial fibrillation: Heart rate controlled, continue metoprolol. Continue Eliquis.  COPD: Continue home inhalers and nebulization, Continue supplemental oxygen.  Chronic pain syndrome: Continue oxycodone, Lyrica  HFp EF: Continue torsemide, metoprolol, Aldactone  Seborrheic dermatitis Rosacea: Continue Otezla and doxycycline    DVT prophylaxis: Eliquis Code Status: DNR Family Communication: Wife at bedside Disposition Plan:    Status is: Inpatient  Remains  inpatient appropriate because: Inpatient  Admitted for acute on chronic hypoxic respiratory failure secondary to COVID pneumonia.  Patient has increased oxygen requirement requiring 3 to 4 L.  Anticipated discharge home 12/13.    Consultants:  None  Procedures: None Antimicrobials:   Anti-infectives (From admission, onward)    Start     Dose/Rate Route Frequency Ordered Stop   11/13/21 2200  doxycycline (VIBRA-TABS) tablet 100 mg        100 mg Oral 2 times daily 11/13/21 1754     11/13/21 1000  remdesivir 100 mg in sodium chloride 0.9 % 100 mL IVPB        100 mg 200 mL/hr over 30 Minutes Intravenous Daily 11/12/21 2154 11/17/21 0959   11/12/21 2230  remdesivir 100 mg in sodium chloride 0.9 % 100 mL IVPB       See Hyperspace for full Linked Orders Report.   100 mg 200 mL/hr over 30 Minutes Intravenous  Once 11/12/21 2154 11/13/21 0404   11/12/21 2200  cefTRIAXone (ROCEPHIN) 1 g in sodium chloride 0.9 % 100 mL IVPB        1 g 200 mL/hr over 30 Minutes Intravenous  Once 11/12/21 2152 11/12/21 2335   11/12/21 2200  azithromycin (ZITHROMAX) 500 mg in sodium chloride 0.9 % 250 mL IVPB        500 mg 250 mL/hr over 60 Minutes Intravenous  Once 11/12/21 2152 11/13/21 0058   11/12/21 2200  remdesivir 100 mg in sodium chloride 0.9 % 100 mL IVPB       See Hyperspace for full Linked Orders Report.   100 mg 200 mL/hr over 30 Minutes Intravenous  Once 11/12/21 2154 11/13/21 0120  Subjective: Patient was seen and examined at bedside.  Overnight events noted.   Patient reports feeling much better.  He is at his baseline oxygen requirement now.   He reports having worsening shortness of breath with exertion.  Objective: Vitals:   11/14/21 0540 11/14/21 0800 11/14/21 2047 11/15/21 0530  BP: 138/77  117/73 106/69  Pulse: 79  86 76  Resp: 18   20  Temp: 97.7 F (36.5 C)  98.4 F (36.9 C) 98.4 F (36.9 C)  TempSrc: Oral  Oral Oral  SpO2: 98% 98% 98% 97%  Weight:      Height:         Intake/Output Summary (Last 24 hours) at 11/15/2021 1200 Last data filed at 11/15/2021 0745 Gross per 24 hour  Intake 240 ml  Output 3600 ml  Net -3360 ml   Filed Weights   11/12/21 1843 11/13/21 1442  Weight: 122.5 kg 121.7 kg    Examination:  General exam: Appears comfortable, deconditioned, not in any distress. Respiratory system: Clear to auscultation bilaterally. Respiratory effort normal.  RR 13 Cardiovascular system: S1 & S2 heard, irregular rhythm, no murmur. Gastrointestinal system: Abdomen is soft, nontender, nondistended, BS+ Central nervous system: Alert and oriented x 3. No focal neurological deficits. Extremities: No edema, no cyanosis, no clubbing. Skin: No rashes, lesions or ulcers Psychiatry: Judgement and insight appear normal. Mood & affect appropriate.     Data Reviewed: I have personally reviewed following labs and imaging studies  CBC: Recent Labs  Lab 11/12/21 1931 11/13/21 0736 11/14/21 0405 11/15/21 0347  WBC 4.2 4.9 3.0* 3.7*  NEUTROABS 1.8 2.7 2.0 2.3  HGB 12.7* 11.8* 13.8 13.1  HCT 39.7 36.2* 40.9 39.0  MCV 105.9* 105.8* 102.5* 101.6*  PLT 182 170 133* 211   Basic Metabolic Panel: Recent Labs  Lab 11/12/21 1931 11/13/21 0736 11/14/21 0405 11/15/21 0347  NA 140 141 139 139  K 3.5 3.5 3.8 3.3*  CL 96* 99 96* 96*  CO2 37* 37* 33* 35*  GLUCOSE 90 95 129* 139*  BUN 12 10 16 20   CREATININE 0.91 0.78 0.88 0.94  CALCIUM 9.6 9.1 8.6* 8.8*   GFR: Estimated Creatinine Clearance: 83.2 mL/min (by C-G formula based on SCr of 0.94 mg/dL). Liver Function Tests: Recent Labs  Lab 11/12/21 1931 11/13/21 0736 11/14/21 0405 11/15/21 0347  AST 19 18 22 20   ALT 12 11 15 14   ALKPHOS 86 82 89 80  BILITOT 0.3 0.3 0.5 0.6  PROT 6.9 6.4* 6.9 6.8  ALBUMIN 3.6 3.4* 3.4* 3.4*   No results for input(s): LIPASE, AMYLASE in the last 168 hours. No results for input(s): AMMONIA in the last 168 hours. Coagulation Profile: No results for  input(s): INR, PROTIME in the last 168 hours. Cardiac Enzymes: No results for input(s): CKTOTAL, CKMB, CKMBINDEX, TROPONINI in the last 168 hours. BNP (last 3 results) No results for input(s): PROBNP in the last 8760 hours. HbA1C: No results for input(s): HGBA1C in the last 72 hours. CBG: No results for input(s): GLUCAP in the last 168 hours. Lipid Profile: Recent Labs    11/12/21 2226  TRIG 116   Thyroid Function Tests: No results for input(s): TSH, T4TOTAL, FREET4, T3FREE, THYROIDAB in the last 72 hours. Anemia Panel: Recent Labs    11/14/21 0405 11/15/21 0347  FERRITIN 738* 756*   Sepsis Labs: Recent Labs  Lab 11/12/21 2226 11/13/21 0525 11/14/21 0405  PROCALCITON <0.10 <0.10 <0.10  LATICACIDVEN 1.1  --   --  Recent Results (from the past 240 hour(s))  Resp Panel by RT-PCR (Flu A&B, Covid) Nasopharyngeal Swab     Status: Abnormal   Collection Time: 11/12/21  6:51 PM   Specimen: Nasopharyngeal Swab; Nasopharyngeal(NP) swabs in vial transport medium  Result Value Ref Range Status   SARS Coronavirus 2 by RT PCR POSITIVE (A) NEGATIVE Final    Comment: RESULT CALLED TO, READ BACK BY AND VERIFIED WITH: jaimie oneal, rn 11/12/21 1937 src (NOTE) SARS-CoV-2 target nucleic acids are DETECTED.  The SARS-CoV-2 RNA is generally detectable in upper respiratory specimens during the acute phase of infection. Positive results are indicative of the presence of the identified virus, but do not rule out bacterial infection or co-infection with other pathogens not detected by the test. Clinical correlation with patient history and other diagnostic information is necessary to determine patient infection status. The expected result is Negative.  Fact Sheet for Patients: BloggerCourse.com  Fact Sheet for Healthcare Providers: SeriousBroker.it  This test is not yet approved or cleared by the [place] FDA and  has been  authorized for detection and/or diagnosis of SARS-CoV-2 by FDA under an Emergency Use Authorization (EUA).  This EUA will remain in effect (meaning this test can be  used) for the duration of  the COVID-19 declaration under Section 564(b)(1) of the Act, 21 U.S.C. section 360bbb-3(b)(1), unless the authorization is terminated or revoked sooner.     Influenza A by PCR NEGATIVE NEGATIVE Final   Influenza B by PCR NEGATIVE NEGATIVE Final    Comment: (NOTE) The Xpert Xpress SARS-CoV-2/FLU/RSV plus assay is intended as an aid in the diagnosis of influenza from Nasopharyngeal swab specimens and should not be used as a sole basis for treatment. Nasal washings and aspirates are unacceptable for Xpert Xpress SARS-CoV-2/FLU/RSV testing.  Fact Sheet for Patients: BloggerCourse.com  Fact Sheet for Healthcare Providers: SeriousBroker.it  This test is not yet approved or cleared by the [place] FDA and has been authorized for detection and/or diagnosis of SARS-CoV-2 by FDA under an Emergency Use Authorization (EUA). This EUA will remain in effect (meaning this test can be used) for the duration of the COVID-19 declaration under Section 564(b)(1) of the Act, 21 U.S.C. section 360bbb-3(b)(1), unless the authorization is terminated or revoked.  Performed at [facility], [address] [place], [place] [address]   Blood Culture (routine x 2)     Status: None (Preliminary result)   Collection Time: 11/13/21  2:12 PM   Specimen: BLOOD  Result Value Ref Range Status   Specimen Description   Final    BLOOD LEFT ANTECUBITAL Performed at [facility], [address] [place], [place] [address]    Special Requests   Final    BOTTLES DRAWN AEROBIC ONLY Blood Culture adequate volume Performed at [facility], [address] [place], [place] [address]    Culture   Final    NO GROWTH 2  DAYS Performed at [facility], [address], [place], [place] [address]    Report Status PENDING  Incomplete  Blood Culture (routine x 2)     Status: None (Preliminary result)   Collection Time: 11/13/21  2:12 PM   Specimen: BLOOD  Result Value Ref Range Status   Specimen Description   Final    BLOOD RIGHT ANTECUBITAL Performed at [facility], [address], [place], [place] [address]    Special Requests   Final    BOTTLES DRAWN AEROBIC ONLY  Blood Culture adequate volume Performed at [facility], [address] [place], [place] [address]    Culture   Final    NO GROWTH 2 DAYS Performed at [facility], [address] [place], [place] [address]    Report Status PENDING  Incomplete  Gastrointestinal Panel by PCR , Stool     Status: None   Collection Time: 11/13/21  7:29 PM   Specimen: Stool  Result Value Ref Range Status   Campylobacter species NOT DETECTED NOT DETECTED Final   Plesimonas shigelloides NOT DETECTED NOT DETECTED Final   Salmonella species NOT DETECTED NOT DETECTED Final   Yersinia enterocolitica NOT DETECTED NOT DETECTED Final   Vibrio species NOT DETECTED NOT DETECTED Final   Vibrio cholerae NOT DETECTED NOT DETECTED Final   Enteroaggregative E coli (EAEC) NOT DETECTED NOT DETECTED Final   Enteropathogenic E coli (EPEC) NOT DETECTED NOT DETECTED Final   Enterotoxigenic E coli (ETEC) NOT DETECTED NOT DETECTED Final   Shiga like toxin producing E coli (STEC) NOT DETECTED NOT DETECTED Final   Shigella/Enteroinvasive E coli (EIEC) NOT DETECTED NOT DETECTED Final   Cryptosporidium NOT DETECTED NOT DETECTED Final   Cyclospora cayetanensis NOT DETECTED NOT DETECTED Final   Entamoeba histolytica NOT DETECTED NOT DETECTED Final   Giardia lamblia NOT DETECTED NOT DETECTED Final   Adenovirus F40/41 NOT DETECTED NOT DETECTED Final   Astrovirus NOT DETECTED NOT DETECTED Final   Norovirus GI/GII NOT DETECTED NOT  DETECTED Final   Rotavirus A NOT DETECTED NOT DETECTED Final   Sapovirus (I, II, IV, and V) NOT DETECTED NOT DETECTED Final    Comment: Performed at [facility], [address] [place], [place] [address]  C Difficile Quick Screen w PCR reflex     Status: None   Collection Time: 11/13/21  7:29 PM   Specimen: STOOL  Result Value Ref Range Status   C Diff antigen NEGATIVE NEGATIVE Final   C Diff toxin NEGATIVE NEGATIVE Final   C Diff interpretation NEGATIVE  Final    Comment: Performed at [facility], [address], [place], [place] [address]    Radiology Studies: No results found.  Scheduled Meds:  apixaban  5 mg Oral BID   Apremilast  30 mg Oral BID   vitamin C  500 mg Oral Daily   atropine  1 drop Left Eye QHS   cholecalciferol  400 Units Oral Daily   dexamethasone (DECADRON) injection  6 mg Intravenous Q24H   docusate sodium  100 mg Oral Daily   doxycycline  100 mg Oral BID   erythromycin  1 application Both Eyes QHS   fluticasone  2 spray Each Nare Daily   Ipratropium-Albuterol  1 puff Inhalation Q6H   latanoprost  1 drop Right Eye QHS   mouth rinse  15 mL Mouth Rinse BID   metoprolol succinate  25 mg Oral Daily   prednisoLONE acetate  1 drop Left Eye QHS   pregabalin  50 mg Oral BID   spironolactone  25 mg Oral Daily   tamsulosin  0.4 mg Oral QHS   torsemide  40 mg Oral BID   zinc sulfate  220 mg Oral Daily   Continuous Infusions:  sodium chloride Stopped (11/14/21 1130)   remdesivir 100 mg in NS 100 mL 100 mg (11/15/21 1009)     LOS: 1 day    Time spent: 25 mins    Cipriano Bunker, MD [facility]   If  7PM-7AM, please contact night-coverage

## 2021-11-15 NOTE — TOC Initial Note (Signed)
Transition of Care [facility]) - Initial/Assessment Note    Patient Details  Name: Maurice Reed MRN: [id number] Date of Birth: [birth year]  Transition of Care [facility]) CM/SW Contact:    Golda Acre, RN Phone Number: 11/15/2021, 8:39 AM  Clinical Narrative:                  Transition of Care [facility]) Screening Note   Patient Details  Name: Maurice Reed Date of Birth: 01-Jul-1941   Transition of Care [facility]) CM/SW Contact:    Golda Acre, RN Phone Number: 11/15/2021, 8:39 AM    Transition of Care Department [name]) has reviewed patient and no TOC needs have been identified at this time. We will continue to monitor patient advancement through interdisciplinary progression rounds. If new patient transition needs arise, please place a TOC consult.    Expected Discharge Plan: Home/Self Care Barriers to Discharge: Continued Medical Work up   Patient Goals and CMS Choice Patient states their goals for this hospitalization and ongoing recovery are:: to go home CMS Medicare.gov Compare Post Acute Care list provided to:: Patient Choice offered to / list presented to : Patient  Expected Discharge Plan and Services Expected Discharge Plan: Home/Self Care   Discharge Planning Services: CM Consult   Living arrangements for the past 2 months: Single Family Home                                      Prior Living Arrangements/Services Living arrangements for the past 2 months: Single Family Home Lives with:: Spouse Patient language and need for interpreter reviewed:: Yes Do you feel safe going back to the place where you live?: Yes      Need for Family Participation in Patient Care: Yes (Comment)     Criminal Activity/Legal Involvement Pertinent to Current Situation/Hospitalization: No - Comment as needed  Activities of Daily Living Home Assistive Devices/Equipment: BIPAP, Dentures (specify type), Eyeglasses, Bedside commode/3-in-1, Oxygen, Shower chair with back, Walker  (specify type) ADL Screening (condition at time of admission) Patient's cognitive ability adequate to safely complete daily activities?: Yes Is the patient deaf or have difficulty hearing?: Yes Does the patient have difficulty seeing, even when wearing glasses/contacts?: No Does the patient have difficulty concentrating, remembering, or making decisions?: No Patient able to express need for assistance with ADLs?: Yes Does the patient have difficulty dressing or bathing?: Yes Independently performs ADLs?: No Communication: Independent Dressing (OT): Needs assistance Is this a change from baseline?: Pre-admission baseline Grooming: Needs assistance Is this a change from baseline?: Pre-admission baseline Feeding: Needs assistance Is this a change from baseline?: Pre-admission baseline Bathing: Needs assistance Is this a change from baseline?: Pre-admission baseline Toileting: Needs assistance Is this a change from baseline?: Pre-admission baseline In/Out Bed: Needs assistance Is this a change from baseline?: Pre-admission baseline Walks in Home: Needs assistance Is this a change from baseline?: Pre-admission baseline Does the patient have difficulty walking or climbing stairs?: Yes Weakness of Legs: Both Weakness of Arms/Hands: None  Permission Sought/Granted                  Emotional Assessment Appearance:: Appears stated age Attitude/Demeanor/Rapport: Engaged Affect (typically observed): Calm Orientation: : Oriented to Place, Oriented to Self, Oriented to  Time, Oriented to Situation Alcohol / Substance Use: Not Applicable Psych Involvement: No (comment)  Admission diagnosis:  COVID [U07.1] Pneumonia due to COVID-19 virus [U07.1, J12.82] Patient  Active Problem List   Diagnosis Date Noted   Pneumonia due to COVID-19 virus 11/12/2021   Secondary hypercoagulable state (HCC) 06/08/2021   Hyperlipidemia 01/27/2021   Mild aortic stenosis 01/27/2021   Chronic  anticoagulation 04/16/2020   Chronic constipation 03/03/2020   Bilateral lower abdominal discomfort 03/03/2020   Chronic respiratory failure with hypoxia (HCC) 12/23/2019   Shortness of breath 12/23/2019   Healthcare maintenance 08/16/2019   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   Sensorineural hearing loss (SNHL), 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 10/17/2018   Bilateral lower extremity edema 07/04/2018   Sclerosis of the skin 06/26/2018   ILD (interstitial lung disease) (HCC) 05/22/2018   Insomnia 05/22/2018   Erectile dysfunction 05/22/2018   Obesity    Kidney stones    Essential hypertension    GERD (gastroesophageal reflux disease)    Former smoker    Arthritis    Spondylosis without myelopathy or radiculopathy, 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   OSA treated with BiPAP 06/17/2013   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

## 2021-11-16 ENCOUNTER — Inpatient Hospital Stay (HOSPITAL_COMMUNITY)
Admission: EM | Admit: 2021-11-16 | Discharge: 2021-11-19 | DRG: 312 | Disposition: A | Discharge status: Home / Self Care | Payer: Medicare Other | Attending: Internal Medicine | Admitting: Internal Medicine

## 2021-11-16 ENCOUNTER — Encounter (HOSPITAL_COMMUNITY): Payer: Self-pay

## 2021-11-16 ENCOUNTER — Emergency Department (HOSPITAL_COMMUNITY): Payer: Medicare Other

## 2021-11-16 ENCOUNTER — Other Ambulatory Visit: Payer: Self-pay

## 2021-11-16 ENCOUNTER — Telehealth: Payer: Self-pay

## 2021-11-16 DIAGNOSIS — E876 Hypokalemia: Secondary | ICD-10-CM | POA: Diagnosis present

## 2021-11-16 DIAGNOSIS — Z823 Family history of stroke: Secondary | ICD-10-CM

## 2021-11-16 DIAGNOSIS — Z86711 Personal history of pulmonary embolism: Secondary | ICD-10-CM

## 2021-11-16 DIAGNOSIS — K219 Gastro-esophageal reflux disease without esophagitis: Secondary | ICD-10-CM | POA: Diagnosis present

## 2021-11-16 DIAGNOSIS — I952 Hypotension due to drugs: Principal | ICD-10-CM | POA: Diagnosis present

## 2021-11-16 DIAGNOSIS — Z66 Do not resuscitate: Secondary | ICD-10-CM | POA: Diagnosis present

## 2021-11-16 DIAGNOSIS — Z8261 Family history of arthritis: Secondary | ICD-10-CM

## 2021-11-16 DIAGNOSIS — L719 Rosacea, unspecified: Secondary | ICD-10-CM | POA: Diagnosis present

## 2021-11-16 DIAGNOSIS — F5104 Psychophysiologic insomnia: Secondary | ICD-10-CM | POA: Diagnosis present

## 2021-11-16 DIAGNOSIS — E1140 Type 2 diabetes mellitus with diabetic neuropathy, unspecified: Secondary | ICD-10-CM | POA: Diagnosis present

## 2021-11-16 DIAGNOSIS — I4821 Permanent atrial fibrillation: Secondary | ICD-10-CM | POA: Diagnosis present

## 2021-11-16 DIAGNOSIS — J9611 Chronic respiratory failure with hypoxia: Secondary | ICD-10-CM | POA: Diagnosis present

## 2021-11-16 DIAGNOSIS — E785 Hyperlipidemia, unspecified: Secondary | ICD-10-CM | POA: Diagnosis present

## 2021-11-16 DIAGNOSIS — G894 Chronic pain syndrome: Secondary | ICD-10-CM | POA: Diagnosis present

## 2021-11-16 DIAGNOSIS — J61 Pneumoconiosis due to asbestos and other mineral fibers: Secondary | ICD-10-CM | POA: Diagnosis present

## 2021-11-16 DIAGNOSIS — I10 Essential (primary) hypertension: Secondary | ICD-10-CM | POA: Diagnosis present

## 2021-11-16 DIAGNOSIS — N400 Enlarged prostate without lower urinary tract symptoms: Secondary | ICD-10-CM | POA: Diagnosis present

## 2021-11-16 DIAGNOSIS — Z6837 Body mass index (BMI) 37.0-37.9, adult: Secondary | ICD-10-CM

## 2021-11-16 DIAGNOSIS — T501X5A Adverse effect of loop [high-ceiling] diuretics, initial encounter: Secondary | ICD-10-CM | POA: Diagnosis present

## 2021-11-16 DIAGNOSIS — J849 Interstitial pulmonary disease, unspecified: Secondary | ICD-10-CM | POA: Diagnosis present

## 2021-11-16 DIAGNOSIS — Z87891 Personal history of nicotine dependence: Secondary | ICD-10-CM

## 2021-11-16 DIAGNOSIS — I4891 Unspecified atrial fibrillation: Secondary | ICD-10-CM | POA: Diagnosis present

## 2021-11-16 DIAGNOSIS — Z7901 Long term (current) use of anticoagulants: Secondary | ICD-10-CM

## 2021-11-16 DIAGNOSIS — G4733 Obstructive sleep apnea (adult) (pediatric): Secondary | ICD-10-CM | POA: Diagnosis present

## 2021-11-16 DIAGNOSIS — U071 COVID-19: Secondary | ICD-10-CM | POA: Diagnosis present

## 2021-11-16 DIAGNOSIS — L219 Seborrheic dermatitis, unspecified: Secondary | ICD-10-CM | POA: Diagnosis present

## 2021-11-16 DIAGNOSIS — R55 Syncope and collapse: Secondary | ICD-10-CM | POA: Diagnosis present

## 2021-11-16 DIAGNOSIS — Z8249 Family history of ischemic heart disease and other diseases of the circulatory system: Secondary | ICD-10-CM

## 2021-11-16 DIAGNOSIS — M1990 Unspecified osteoarthritis, unspecified site: Secondary | ICD-10-CM | POA: Diagnosis present

## 2021-11-16 DIAGNOSIS — J439 Emphysema, unspecified: Secondary | ICD-10-CM | POA: Diagnosis present

## 2021-11-16 DIAGNOSIS — I110 Hypertensive heart disease with heart failure: Secondary | ICD-10-CM | POA: Diagnosis present

## 2021-11-16 DIAGNOSIS — I5030 Unspecified diastolic (congestive) heart failure: Secondary | ICD-10-CM | POA: Diagnosis present

## 2021-11-16 DIAGNOSIS — Z841 Family history of disorders of kidney and ureter: Secondary | ICD-10-CM

## 2021-11-16 DIAGNOSIS — E861 Hypovolemia: Secondary | ICD-10-CM | POA: Diagnosis present

## 2021-11-16 DIAGNOSIS — I5032 Chronic diastolic (congestive) heart failure: Secondary | ICD-10-CM | POA: Diagnosis present

## 2021-11-16 HISTORY — DX: Obstructive sleep apnea (adult) (pediatric): G47.33

## 2021-11-16 LAB — COMPREHENSIVE METABOLIC PANEL
ALT: 15 U/L (ref 0–44)
ALT: 19 U/L (ref 0–44)
AST: 23 U/L (ref 15–41)
AST: 29 U/L (ref 15–41)
Albumin: 3.4 g/dL — ABNORMAL LOW (ref 3.5–5.0)
Albumin: 3.2 g/dL — ABNORMAL LOW (ref 3.5–5.0)
Alkaline Phosphatase: 83 U/L (ref 38–126)
Alkaline Phosphatase: 81 U/L (ref 38–126)
Anion gap: 12 (ref 5–15)
Anion gap: 11 (ref 5–15)
BUN: 25 mg/dL — ABNORMAL HIGH (ref 8–23)
BUN: 24 mg/dL — ABNORMAL HIGH (ref 8–23)
CO2: 33 mmol/L — ABNORMAL HIGH (ref 22–32)
CO2: 34 mmol/L — ABNORMAL HIGH (ref 22–32)
Calcium: 8.9 mg/dL (ref 8.9–10.3)
Calcium: 8 mg/dL — ABNORMAL LOW (ref 8.9–10.3)
Chloride: 92 mmol/L — ABNORMAL LOW (ref 98–111)
Chloride: 96 mmol/L — ABNORMAL LOW (ref 98–111)
Creatinine, Ser: 0.93 mg/dL (ref 0.61–1.24)
Creatinine, Ser: 0.91 mg/dL (ref 0.61–1.24)
GFR, Estimated: >60 mL/min (ref >60)
GFR, Estimated: >60 mL/min (ref >60)
Glucose, Bld: 151 mg/dL — ABNORMAL HIGH (ref 70–99)
Glucose, Bld: 95 mg/dL (ref 70–99)
Potassium: 3.7 mmol/L (ref 3.5–5.1)
Potassium: 2.8 mmol/L — ABNORMAL LOW (ref 3.5–5.1)
Sodium: 137 mmol/L (ref 135–145)
Sodium: 141 mmol/L (ref 135–145)
Total Bilirubin: 0.5 mg/dL (ref 0.3–1.2)
Total Bilirubin: 0.5 mg/dL (ref 0.3–1.2)
Total Protein: 6.9 g/dL (ref 6.5–8.1)
Total Protein: 6.7 g/dL (ref 6.5–8.1)

## 2021-11-16 LAB — TROPONIN I (HIGH SENSITIVITY)
Troponin I (High Sensitivity): 7 ng/L (ref <18)
Troponin I (High Sensitivity): 7 ng/L (ref <18)

## 2021-11-16 LAB — CBC WITH DIFFERENTIAL/PLATELET
Abs Immature Granulocytes: 0.03 10*3/uL (ref 0.00–0.07)
Abs Immature Granulocytes: 0.06 10*3/uL (ref 0.00–0.07)
Basophils Absolute: 0 10*3/uL (ref 0.0–0.1)
Basophils Absolute: 0 10*3/uL (ref 0.0–0.1)
Basophils Relative: 0 %
Basophils Relative: 0 %
Eosinophils Absolute: 0 10*3/uL (ref 0.0–0.5)
Eosinophils Absolute: 0 10*3/uL (ref 0.0–0.5)
Eosinophils Relative: 0 %
Eosinophils Relative: 0 %
HCT: 42.5 % (ref 39.0–52.0)
HCT: 44.4 % (ref 39.0–52.0)
Hemoglobin: 14.3 g/dL (ref 13.0–17.0)
Hemoglobin: 14.7 g/dL (ref 13.0–17.0)
Immature Granulocytes: 1 %
Immature Granulocytes: 1 %
Lymphocytes Relative: 21 %
Lymphocytes Relative: 13 %
Lymphs Abs: 0.9 10*3/uL (ref 0.7–4.0)
Lymphs Abs: 1.2 10*3/uL (ref 0.7–4.0)
MCH: 33.7 pg (ref 26.0–34.0)
MCH: 33.9 pg (ref 26.0–34.0)
MCHC: 33.6 g/dL (ref 30.0–36.0)
MCHC: 33.1 g/dL (ref 30.0–36.0)
MCV: 100.2 fL — ABNORMAL HIGH (ref 80.0–100.0)
MCV: 102.3 fL — ABNORMAL HIGH (ref 80.0–100.0)
Monocytes Absolute: 0.3 10*3/uL (ref 0.1–1.0)
Monocytes Absolute: 1.1 10*3/uL — ABNORMAL HIGH (ref 0.1–1.0)
Monocytes Relative: 7 %
Monocytes Relative: 12 %
Neutro Abs: 3 10*3/uL (ref 1.7–7.7)
Neutro Abs: 6.9 10*3/uL (ref 1.7–7.7)
Neutrophils Relative %: 71 %
Neutrophils Relative %: 74 %
Platelets: 211 10*3/uL (ref 150–400)
Platelets: 229 10*3/uL (ref 150–400)
RBC: 4.24 MIL/uL (ref 4.22–5.81)
RBC: 4.34 MIL/uL (ref 4.22–5.81)
RDW: 12.3 % (ref 11.5–15.5)
RDW: 12.4 % (ref 11.5–15.5)
WBC: 4.3 10*3/uL (ref 4.0–10.5)
WBC: 9.2 10*3/uL (ref 4.0–10.5)
nRBC: 0 % (ref 0.0–0.2)
nRBC: 0 % (ref 0.0–0.2)

## 2021-11-16 LAB — I-STAT CHEM 8, ED
BUN: 24 mg/dL — ABNORMAL HIGH (ref 8–23)
Calcium, Ion: 1.05 mmol/L — ABNORMAL LOW (ref 1.15–1.40)
Chloride: 93 mmol/L — ABNORMAL LOW (ref 98–111)
Creatinine, Ser: 1 mg/dL (ref 0.61–1.24)
Glucose, Bld: 98 mg/dL (ref 70–99)
HCT: 44 % (ref 39.0–52.0)
Hemoglobin: 15 g/dL (ref 13.0–17.0)
Potassium: 3 mmol/L — ABNORMAL LOW (ref 3.5–5.1)
Sodium: 140 mmol/L (ref 135–145)
TCO2: 37 mmol/L — ABNORMAL HIGH (ref 22–32)

## 2021-11-16 LAB — C-REACTIVE PROTEIN: CRP: 0.9 mg/dL (ref <1.0)

## 2021-11-16 LAB — MAGNESIUM
Magnesium: 1.5 mg/dL — ABNORMAL LOW (ref 1.7–2.4)
Magnesium: 1.8 mg/dL (ref 1.7–2.4)

## 2021-11-16 LAB — FERRITIN: Ferritin: 982 ng/mL — ABNORMAL HIGH (ref 24–336)

## 2021-11-16 LAB — PHOSPHORUS: Phosphorus: 3.8 mg/dL (ref 2.5–4.6)

## 2021-11-16 LAB — D-DIMER, QUANTITATIVE: D-Dimer, Quant: 0.67 ug/mL-FEU — ABNORMAL HIGH (ref 0.00–0.50)

## 2021-11-16 IMAGING — DX DG CHEST 1V PORT
1 series · 1 of 1 positions shown · non-contrast
Comparison: [DATE]

CLINICAL DATA: Shortness of breath

EXAM:
PORTABLE CHEST 1 VIEW

[chest ap]
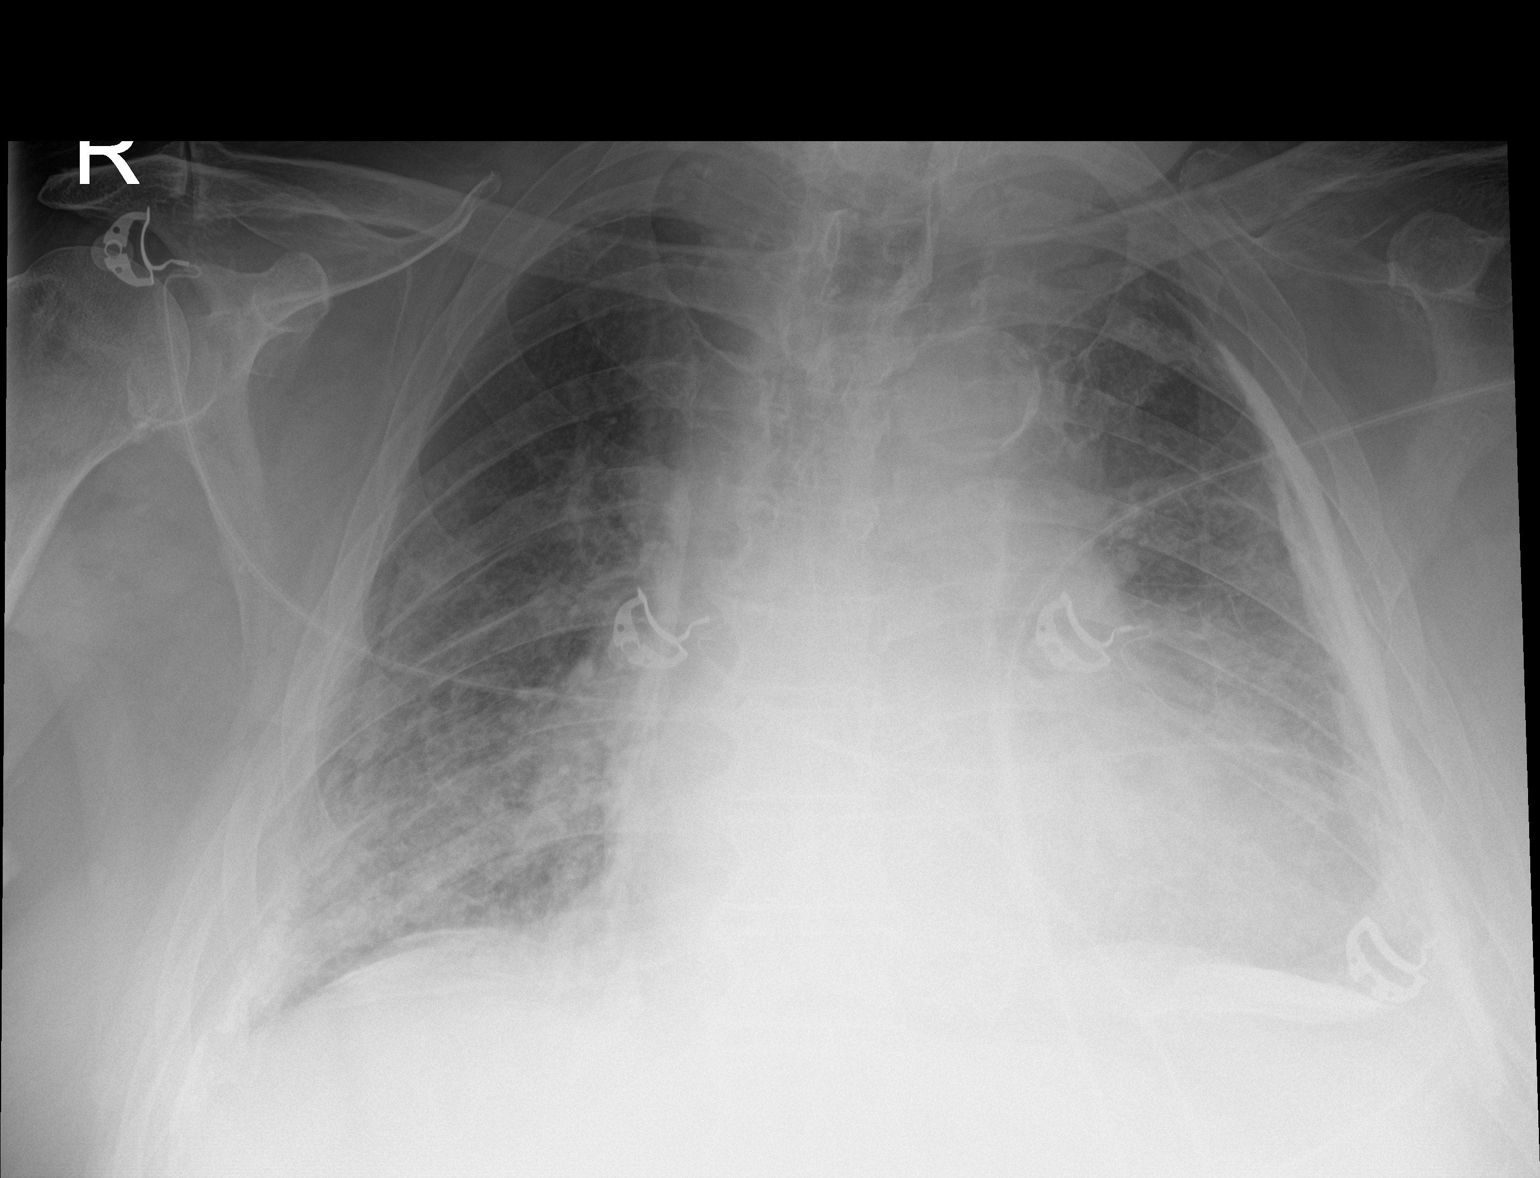

[1 of 1 positions shown; findings below may reference images not displayed]

FINDINGS: Cardiomegaly, vascular congestion. Bilateral interstitial and
airspace opacities, which could reflect edema or infection
superimposed on chronic lung disease. Extensive calcified pleural
plaques on the left and right lung base. Fall no visible effusions.
No acute bony abnormality.
IMPRESSION: Cardiomegaly with vascular congestion.

Bilateral interstitial/alveolar opacities could reflect edema or
infection.

## 2021-11-16 MED ORDER — MAGNESIUM OXIDE -MG SUPPLEMENT 400 (240 MG) MG PO TABS
400.0000 mg | ORAL_TABLET | Freq: Two times a day (BID) | ORAL | Status: DC
  Administered 2021-11-17 – 2021-11-19 (×5): 400 mg via ORAL
  Filled 2021-11-16 (×5): qty 1

## 2021-11-16 MED ORDER — ALBUTEROL SULFATE HFA 108 (90 BASE) MCG/ACT IN AERS
1.0000 | INHALATION_SPRAY | RESPIRATORY_TRACT | Status: DC | PRN
  Administered 2021-11-18: 2 via RESPIRATORY_TRACT
  Filled 2021-11-16: qty 6.7

## 2021-11-16 MED ORDER — MAGNESIUM SULFATE 2 GM/50ML IV SOLN
2.0000 g | Freq: Once | INTRAVENOUS | Status: AC
  Administered 2021-11-16: 2 g via INTRAVENOUS
  Filled 2021-11-16: qty 50

## 2021-11-16 MED ORDER — TAMSULOSIN HCL 0.4 MG PO CAPS
0.4000 mg | ORAL_CAPSULE | Freq: Every day | ORAL | Status: DC
  Administered 2021-11-17 – 2021-11-18 (×2): 0.4 mg via ORAL
  Filled 2021-11-16 (×2): qty 1

## 2021-11-16 MED ORDER — DOXYCYCLINE HYCLATE 100 MG PO TABS
100.0000 mg | ORAL_TABLET | Freq: Two times a day (BID) | ORAL | Status: DC
  Administered 2021-11-17 – 2021-11-19 (×5): 100 mg via ORAL
  Filled 2021-11-16 (×6): qty 1

## 2021-11-16 MED ORDER — CLOBETASOL PROPIONATE 0.05 % EX SOLN: 1.0000 "application " | Freq: Two times a day (BID) | CUTANEOUS | Status: DC | PRN

## 2021-11-16 MED ORDER — OXYCODONE HCL 5 MG PO TABS: 5.0000 mg | ORAL_TABLET | Freq: Three times a day (TID) | ORAL | Status: DC | PRN

## 2021-11-16 MED ORDER — PREGABALIN 50 MG PO CAPS
50.0000 mg | ORAL_CAPSULE | Freq: Two times a day (BID) | ORAL | Status: DC
  Administered 2021-11-17 – 2021-11-19 (×5): 50 mg via ORAL
  Filled 2021-11-16 (×5): qty 1

## 2021-11-16 MED ORDER — SODIUM CHLORIDE 0.9 % IV SOLN
INTRAVENOUS | Status: AC
  Administered 2021-11-16: 100 mg via INTRAVENOUS
  Filled 2021-11-16: qty 20

## 2021-11-16 MED ORDER — ACETAMINOPHEN 325 MG PO TABS: 650.0000 mg | ORAL_TABLET | Freq: Four times a day (QID) | ORAL | Status: DC | PRN

## 2021-11-16 MED ORDER — POTASSIUM CHLORIDE CRYS ER 20 MEQ PO TBCR
40.0000 meq | EXTENDED_RELEASE_TABLET | Freq: Once | ORAL | Status: AC
  Administered 2021-11-16: 40 meq via ORAL
  Filled 2021-11-16: qty 2

## 2021-11-16 MED ORDER — POTASSIUM CHLORIDE 10 MEQ/100ML IV SOLN
10.0000 meq | INTRAVENOUS | Status: AC
  Administered 2021-11-16 (×2): 10 meq via INTRAVENOUS
  Filled 2021-11-16 (×2): qty 100

## 2021-11-16 MED ORDER — IOHEXOL 350 MG/ML SOLN
100.0000 mL | Freq: Once | INTRAVENOUS | Status: AC | PRN
  Administered 2021-11-16: 100 mL via INTRAVENOUS

## 2021-11-16 MED ORDER — ERYTHROMYCIN 5 MG/GM OP OINT
1.0000 "application " | TOPICAL_OINTMENT | Freq: Every day | OPHTHALMIC | Status: DC
  Administered 2021-11-17 – 2021-11-18 (×2): 1 via OPHTHALMIC
  Filled 2021-11-16: qty 1

## 2021-11-16 MED ORDER — TORSEMIDE 40 MG PO TABS: 40.0000 mg | ORAL_TABLET | Freq: Two times a day (BID) | ORAL | 0 refills | Status: DC

## 2021-11-16 MED ORDER — MAGNESIUM OXIDE -MG SUPPLEMENT 400 (240 MG) MG PO TABS
400.0000 mg | ORAL_TABLET | Freq: Once | ORAL | Status: AC
  Administered 2021-11-16: 400 mg via ORAL
  Filled 2021-11-16: qty 1

## 2021-11-16 MED ORDER — ACETAMINOPHEN 650 MG RE SUPP: 650.0000 mg | Freq: Four times a day (QID) | RECTAL | Status: DC | PRN

## 2021-11-16 MED ORDER — SPIRONOLACTONE 25 MG PO TABS: 25.0000 mg | ORAL_TABLET | Freq: Every day | ORAL | Status: DC

## 2021-11-16 MED ORDER — IPRATROPIUM-ALBUTEROL 0.5-2.5 (3) MG/3ML IN SOLN
3.0000 mL | Freq: Four times a day (QID) | RESPIRATORY_TRACT | Status: DC | PRN
  Administered 2021-11-18: 3 mL via RESPIRATORY_TRACT
  Filled 2021-11-16: qty 3

## 2021-11-16 MED ORDER — POTASSIUM CHLORIDE 20 MEQ PO PACK
20.0000 meq | PACK | Freq: Every day | ORAL | Status: DC
  Administered 2021-11-17 – 2021-11-19 (×3): 20 meq via ORAL
  Filled 2021-11-16 (×3): qty 1

## 2021-11-16 MED ORDER — SODIUM CHLORIDE 0.9% FLUSH
3.0000 mL | Freq: Two times a day (BID) | INTRAVENOUS | Status: DC
  Administered 2021-11-17 – 2021-11-19 (×4): 3 mL via INTRAVENOUS

## 2021-11-16 MED ORDER — POTASSIUM CHLORIDE IN NACL 40-0.9 MEQ/L-% IV SOLN
INTRAVENOUS | Status: AC
  Filled 2021-11-16: qty 1000

## 2021-11-16 MED ORDER — LATANOPROST 0.005 % OP SOLN
1.0000 [drp] | Freq: Every day | OPHTHALMIC | Status: DC
  Administered 2021-11-17 – 2021-11-18 (×2): 1 [drp] via OPHTHALMIC
  Filled 2021-11-16: qty 2.5

## 2021-11-16 MED ORDER — DEXAMETHASONE SODIUM PHOSPHATE 10 MG/ML IJ SOLN
6.0000 mg | Freq: Once | INTRAMUSCULAR | Status: AC
  Administered 2021-11-16: 6 mg via INTRAVENOUS
  Filled 2021-11-16: qty 1

## 2021-11-16 MED ORDER — ONDANSETRON HCL 4 MG PO TABS: 4.0000 mg | ORAL_TABLET | Freq: Four times a day (QID) | ORAL | Status: DC | PRN

## 2021-11-16 MED ORDER — DEXAMETHASONE 4 MG PO TABS
6.0000 mg | ORAL_TABLET | Freq: Every day | ORAL | Status: DC
  Administered 2021-11-17 – 2021-11-19 (×3): 6 mg via ORAL
  Filled 2021-11-16: qty 1
  Filled 2021-11-16 (×2): qty 2

## 2021-11-16 MED ORDER — ONDANSETRON HCL 4 MG/2ML IJ SOLN: 4.0000 mg | Freq: Four times a day (QID) | INTRAMUSCULAR | Status: DC | PRN

## 2021-11-16 MED ORDER — DEXAMETHASONE 6 MG PO TABS: 6.0000 mg | ORAL_TABLET | Freq: Every day | ORAL | 0 refills | Status: AC

## 2021-11-16 MED ORDER — APREMILAST 30 MG PO TABS
30.0000 mg | ORAL_TABLET | Freq: Two times a day (BID) | ORAL | Status: DC
  Administered 2021-11-17 – 2021-11-19 (×3): 30 mg via ORAL
  Filled 2021-11-16: qty 1

## 2021-11-16 MED ORDER — APIXABAN 5 MG PO TABS
5.0000 mg | ORAL_TABLET | Freq: Two times a day (BID) | ORAL | Status: DC
  Administered 2021-11-16 – 2021-11-19 (×6): 5 mg via ORAL
  Filled 2021-11-16 (×7): qty 1

## 2021-11-16 MED ORDER — PREDNISOLONE ACETATE 1 % OP SUSP
1.0000 [drp] | Freq: Every day | OPHTHALMIC | Status: DC
  Administered 2021-11-17 – 2021-11-18 (×2): 1 [drp] via OPHTHALMIC
  Filled 2021-11-16: qty 5

## 2021-11-16 MED ORDER — ATROPINE SULFATE 1 % OP SOLN
1.0000 [drp] | Freq: Every day | OPHTHALMIC | Status: DC
  Administered 2021-11-17 – 2021-11-18 (×2): 1 [drp] via OPHTHALMIC
  Filled 2021-11-16: qty 2

## 2021-11-16 MED ORDER — METOPROLOL SUCCINATE ER 25 MG PO TB24
25.0000 mg | ORAL_TABLET | Freq: Every day | ORAL | Status: DC
  Administered 2021-11-17 – 2021-11-19 (×3): 25 mg via ORAL
  Filled 2021-11-16 (×3): qty 1

## 2021-11-16 MED ORDER — TEMAZEPAM 15 MG PO CAPS
15.0000 mg | ORAL_CAPSULE | Freq: Every day | ORAL | Status: DC
  Administered 2021-11-17 – 2021-11-18 (×2): 15 mg via ORAL
  Filled 2021-11-16 (×2): qty 1

## 2021-11-16 MED ORDER — KETOROLAC TROMETHAMINE 0.5 % OP SOLN: 1.0000 [drp] | Freq: Four times a day (QID) | OPHTHALMIC | Status: DC | PRN

## 2021-11-16 NOTE — Progress Notes (Signed)
Patient discharged home with wife, discharge instructions given to patient/wife, they verbalized understanding, patient denies any pain/distress, no wound noted. Accompanied home by wife.

## 2021-11-16 NOTE — Telephone Encounter (Signed)
Patient's wife is calling in stating that Maurice Reed was discharged from the hospital today but the moment they got home Shepherd passed out in the living room. They wanted to let [name] know Caleel is in route back to [place].

## 2021-11-16 NOTE — ED Provider Notes (Signed)
[facility] DEPT Provider Note   CSN: [id number] Arrival date & time: 11/16/21  1529     History Chief Complaint  Patient presents with   Loss of Consciousness   Covid Positive    Maurice Reed is a 80 y.o. male hx of afib on eliquis, previous PE, here with LOC, COVID positive.  Patient was recently admitted for COVID-pneumonia.  Patient is on 3 L nasal cannula at baseline.  Patient finished 4 days of remdesivir and steroids.  Patient was was discharged around 1 PM today.  He was on his baseline 3 L nasal cannula by oxygen concentrator and was going through a drive-through and went home.  He tried to go upstairs and felt very short of breath and felt dizzy and then passed out.  Wife was right there and he did not fall and hit his head.  He apparently was unconscious for about 3 minutes.  EMS was called and patient woke up.  There was no seizure activity noted.  The history is provided by the patient.      Past Medical History:  Diagnosis Date   Arthritis    Atrial fibrillation ([place])    Blind left eye 2005   was a result of cataract surgery   Diverticula, bladder    Erectile dysfunction    GERD (gastroesophageal reflux disease)    Kidney stones    Obesity    Pulmonary embolism ([place])     Patient Active Problem List   Diagnosis Date Noted   Pneumonia due to COVID-19 virus 11/12/2021   Secondary hypercoagulable state ([place]) 06/08/2021   Hyperlipidemia 01/27/2021   Mild aortic stenosis 01/27/2021   Chronic anticoagulation 04/16/2020   Chronic constipation 03/03/2020   Bilateral lower abdominal discomfort 03/03/2020   Chronic respiratory failure with hypoxia ([place]) 12/23/2019   Shortness of breath 12/23/2019   Healthcare maintenance 08/16/2019   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   Sensorineural hearing loss (SNHL), 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 10/17/2018   Bilateral lower extremity edema 07/04/2018    Sclerosis of the skin 06/26/2018   ILD (interstitial lung disease) ([place]) 05/22/2018   Insomnia 05/22/2018   Erectile dysfunction 05/22/2018   Obesity    Kidney stones    Essential hypertension    GERD (gastroesophageal reflux disease)    Former smoker    Arthritis    Spondylosis without myelopathy or radiculopathy, 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   OSA treated with BiPAP 06/17/2013   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   Atrial fibrillation ([place]) 06/17/2013   Rosacea 06/17/2013   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   History of pulmonary embolus (PE) 12/05/1998    Past Surgical History:  Procedure Laterality Date   APPENDECTOMY  1963   LITHOTRIPSY  1987   NOSE SURGERY  2003   TONSILLECTOMY AND ADENOIDECTOMY  3903   UMBILICAL HERNIA REPAIR  1984       Family History  Problem Relation Age of Onset   Arthritis Mother    Hearing loss Mother    Hypertension Mother    Heart disease Mother    Stroke Mother    Arthritis Father    Hearing loss Father    Heart disease Father    Hypertension Father    Heart attack Father    Kidney disease Father    Stroke Father     Social History   Tobacco Use   Smoking status: Former  Packs/day: 3.00    Years: 25.00    Pack years: 75.00    Types: Cigarettes    Quit date: 11/28/1984    Years since quitting: 36.9   Smokeless tobacco: Never  Vaping Use   Vaping Use: Never used  Substance Use Topics   Alcohol use: Yes    Alcohol/week: 1.0 standard drink    Types: 1 Glasses of wine per week    Comment: at times   Drug use: Not Currently    Types: Marijuana    Comment: Gummies     Home Medications Prior to Admission medications   Medication Sig Start Date End Date Taking? Authorizing Provider  apixaban (ELIQUIS) 5 MG TABS tablet Take 1 tablet (5 mg total) by mouth 2 (two) times daily. 09/27/21  Yes Worley, Aldona Bar, PA  Apremilast (OTEZLA) 30 MG TABS Take 30 mg by mouth 2  (two) times daily.   Yes [provider]  metoprolol succinate (TOPROL-XL) 25 MG 24 hr tablet TAKE 1 TABLET BY MOUTH  DAILY Patient taking differently: 25 mg daily. 03/17/21  Yes Inda Coke, PA  torsemide 40 MG TABS Take 40 mg by mouth 2 (two) times daily. 11/16/21  Yes Shawna Clamp, MD  Ascorbic Acid (VITAMIN C) 500 MG CAPS Take 500 mg by mouth daily.    [provider]  Atropine Sulfate-NaCl 0.01-0.9 % SOLN Place 1 drop into the left eye at bedtime.    [provider]  bimatoprost (LUMIGAN) 0.01 % SOLN Place 1 drop into the right eye at bedtime.    [provider]  Cholecalciferol (VITAMIN D3) 3000 units TABS Take 3,000 Units by mouth daily.    [provider]  clobetasol (TEMOVATE) 0.05 % external solution APPLY TO AFFECTED AREA(S)  TOPICALLY ON SCALP 1 TO 2  TIMES DAILY AS NEEDED Patient taking differently: 1 application 2 (two) times daily as needed (for scalp). 09/06/21   Inda Coke, PA  desonide (DESOWEN) 0.05 % lotion Apply 1 application topically daily as needed (itching). 07/20/12   [provider]  dexamethasone (DECADRON) 6 MG tablet Take 1 tablet (6 mg total) by mouth daily for 6 days. 11/16/21 11/22/21  Shawna Clamp, MD  docusate sodium (COLACE) 100 MG capsule Take 100 mg by mouth 2 (two) times daily.    [provider]  doxycycline (VIBRA-TABS) 100 MG tablet TAKE 1 TABLET BY MOUTH  TWICE DAILY Patient taking differently: Take 100 mg by mouth 2 (two) times daily. 06/21/21   Vivi Barrack, MD  erythromycin ophthalmic ointment Place 1 application into both eyes at bedtime. 05/01/18   [provider]  ipratropium-albuterol (DUONEB) 0.5-2.5 (3) MG/3ML SOLN Take 3 mLs by nebulization every 6 (six) hours as needed. Patient taking differently: Take 3 mLs by nebulization every 6 (six) hours as needed (wheezing). 11/12/21   Inda Coke, PA  ketorolac (ACULAR) 0.5 % ophthalmic solution Place 1 drop into  both eyes 4 (four) times daily as needed (irritation). 03/18/21   [provider]  magnesium oxide (MAG-OX) 400 MG tablet TAKE 1 TABLET BY MOUTH TWICE A DAY Patient taking differently: Take 400 mg by mouth 2 (two) times daily. 08/30/21   Inda Coke, PA  mometasone (NASONEX) 50 MCG/ACT nasal spray Place 2 sprays into the nose daily. Patient taking differently: Place 2 sprays into the nose daily as needed (congestion). 03/05/19   Rigoberto Noel, MD  NARCAN 4 MG/0.1ML LIQD nasal spray kit 1 spray once as needed (overdose). 08/22/18   [provider]  nystatin ointment (MYCOSTATIN) Apply 1 application topically 2 (two) times daily. Patient taking differently: Apply 1 application topically daily as needed (skinn irritation). 09/22/20   Inda Coke, PA  oxyCODONE (OXY IR/ROXICODONE) 5 MG immediate release tablet Take 5 mg by mouth 3 (three) times daily as needed for severe pain. for pain 04/23/18   [provider]  prednisoLONE acetate (PRED FORTE) 1 % ophthalmic suspension Place 1 drop into the left eye at bedtime. 04/25/19   [provider]  pregabalin (LYRICA) 50 MG capsule Take 50 mg by mouth 2 (two) times daily.    Jeanella Anton, NP  spironolactone (ALDACTONE) 25 MG tablet Take 25 mg by mouth daily. 09/27/21   [provider]  tadalafil (CIALIS) 20 MG tablet Take 0.5-1 tablets (10-20 mg total) by mouth every other day as needed for erectile dysfunction. 03/01/21   Inda Coke, PA  tamsulosin (FLOMAX) 0.4 MG CAPS capsule TAKE 1 CAPSULE BY MOUTH AT  BEDTIME Patient taking differently: 0.4 mg at bedtime. 04/26/21   Inda Coke, PA  temazepam (RESTORIL) 15 MG capsule TAKE 1 CAPSULE (15 MG TOTAL) BY MOUTH AT BEDTIME AS NEEDED FOR SLEEP. Patient taking differently: Take 15 mg by mouth at bedtime. TAKE 1 CAPSULE (15 MG TOTAL) BY MOUTH AT BEDTIME AS NEEDED FOR SLEEP. 06/02/21   Vivi Barrack, MD  TRIAMCINOLONE PO Apply 1 application topically  daily as needed (itching).    [provider]  zinc gluconate 50 MG tablet Take 50 mg by mouth daily.    [provider]    Allergies    Other  Review of Systems   Review of Systems  Respiratory:  Positive for shortness of breath.   Neurological:  Positive for syncope.  All other systems reviewed and are negative.  Physical Exam Updated Vital Signs BP 134/85    Pulse (!) 55    Temp 97.8 F (36.6 C) (Oral)    Resp 19    Ht _0  (1.803 m)    Wt 121.8 kg    SpO2 99%    BMI 37.45 kg/m   Physical Exam Vitals and nursing note reviewed.  Constitutional:      Comments: Tired and chronically ill  HENT:     Head: Normocephalic.     Nose: Nose normal.     Mouth/Throat:     Mouth: Mucous membranes are dry.  Eyes:     Extraocular Movements: Extraocular movements intact.     Pupils: Pupils are equal, round, and reactive to light.  Cardiovascular:     Rate and Rhythm: Normal rate and regular rhythm.     Pulses: Normal pulses.     Heart sounds: Normal heart sounds.  Pulmonary:     Comments: Dry crackles Abdominal:     General: Abdomen is flat.     Palpations: Abdomen is soft.  Musculoskeletal:        General: Normal range of motion.     Cervical back: Normal range of motion and neck supple.  Skin:    General: Skin is warm.     Capillary Refill: Capillary refill takes less than 2 seconds.  Neurological:     General: No focal deficit present.     Mental Status: He is oriented to person, place, and time.     Comments: Cranial nerves II to XII intact.  Normal strength and sensation bilaterally  Psychiatric:        Mood and Affect: Mood normal.  Behavior: Behavior normal.    ED Results / Procedures / Treatments   Labs (all labs ordered are listed, but only abnormal results are displayed) Labs Reviewed  CBC WITH DIFFERENTIAL/PLATELET - Abnormal; Notable for the following components:      Result Value   MCV 102.3 (*)    Monocytes Absolute 1.1 (*)     All other components within normal limits  COMPREHENSIVE METABOLIC PANEL - Abnormal; Notable for the following components:   Potassium 2.8 (*)    Chloride 96 (*)    CO2 34 (*)    BUN 24 (*)    Calcium 8.0 (*)    Albumin 3.2 (*)    All other components within normal limits  I-STAT CHEM 8, ED - Abnormal; Notable for the following components:   Potassium 3.0 (*)    Chloride 93 (*)    BUN 24 (*)    Calcium, Ion 1.05 (*)    TCO2 37 (*)    All other components within normal limits  MAGNESIUM  TROPONIN I (HIGH SENSITIVITY)    EKG EKG Interpretation  Date/Time:  Tuesday November 16 2021 15:50:32 EST Ventricular Rate:  113 PR Interval:    QRS Duration: 112 QT Interval:  392 QTC Calculation: 538 R Axis:   0 Text Interpretation: Atrial fibrillation Incomplete right bundle branch block Probable anteroseptal infarct, old Nonspecific T abnormalities, lateral leads Prolonged QT interval Artifact in lead(s) I II III aVR aVL aVF poor baseline, rate faster since previous Confirmed by Wandra Arthurs [phone]) on 11/16/2021 4:23:50 PM  Radiology CT Angio Chest PE W and/or Wo Contrast  Result Date: 11/16/2021 CLINICAL DATA:  COVID positive.  Pulmonary embolism suspected. EXAM: CT ANGIOGRAPHY CHEST WITH CONTRAST TECHNIQUE: Multidetector CT imaging of the chest was performed using the standard protocol during bolus administration of intravenous contrast. Multiplanar CT image reconstructions and MIPs were obtained to evaluate the vascular anatomy. CONTRAST:  133m OMNIPAQUE IOHEXOL 350 MG/ML SOLN COMPARISON:  12/17/2019 FINDINGS: Cardiovascular: Heart is enlarged. No substantial pericardial effusion. Coronary artery calcification is evident. Moderate atherosclerotic calcification is noted in the wall of the thoracic aorta. There is no filling defect within the opacified pulmonary arteries to suggest the presence of an acute pulmonary embolus. Mediastinum/Nodes: No mediastinal lymphadenopathy. There is no  hilar lymphadenopathy. The esophagus has normal imaging features. There is no axillary lymphadenopathy. Lungs/Pleura: Mosaic ground-glass attenuation in both lungs is nonspecific but likely reflects air trapping. Calcified pleural plaques bilaterally are consistent with prior asbestos exposure. Architectural distortion and scarring noted in the peripheral lungs bilaterally. Upper Abdomen: Unremarkable. Musculoskeletal: No worrisome lytic or sclerotic osseous abnormality. Review of the MIP images confirms the above findings. IMPRESSION: 1. No CT evidence for acute pulmonary embolus. 2. Mosaic ground-glass attenuation in both lungs is nonspecific but likely reflects air trapping. 3. Calcified pleural plaques bilaterally consistent with prior asbestos exposure. Peripheral architectural distortion/scarring is compatible with asbestos related lung disease. 4. Aortic Atherosclerosis (ICD10-I70.0). Electronically Signed   By: EMisty StanleyM.D.   On: 11/16/2021 17:09   DG Chest Port 1 View  Result Date: 11/16/2021 CLINICAL DATA:  Shortness of breath EXAM: PORTABLE CHEST 1 VIEW COMPARISON:  11/12/2021 FINDINGS: Cardiomegaly, vascular congestion. Bilateral interstitial and airspace opacities, which could reflect edema or infection superimposed on chronic lung disease. Extensive calcified pleural plaques on the left and right lung base. Fall no visible effusions. No acute bony abnormality. IMPRESSION: Cardiomegaly with vascular congestion. Bilateral interstitial/alveolar opacities could reflect edema or infection. Electronically Signed  By: Rolm Baptise M.D.   On: 11/16/2021 17:09    Procedures Procedures   CRITICAL CARE Performed by: Wandra Arthurs   Total critical care time:30 minutes  Critical care time was exclusive of separately billable procedures and treating other patients.  Critical care was necessary to treat or prevent imminent or life-threatening deterioration.  Critical care was time spent  personally by me on the following activities: development of treatment plan with patient and/or surrogate as well as nursing, discussions with consultants, evaluation of patient's response to treatment, examination of patient, obtaining history from patient or surrogate, ordering and performing treatments and interventions, ordering and review of laboratory studies, ordering and review of radiographic studies, pulse oximetry and re-evaluation of patient's condition.   Medications Ordered in ED Medications  potassium chloride 10 mEq in 100 mL IVPB (10 mEq Intravenous New Bag/Given 11/16/21 1744)  iohexol (OMNIPAQUE) 350 MG/ML injection 100 mL (100 mLs Intravenous Contrast Given 11/16/21 1703)  potassium chloride SA (KLOR-CON M) CR tablet 40 mEq (40 mEq Oral Given 11/16/21 1737)  dexamethasone (DECADRON) injection 6 mg (6 mg Intravenous Given 11/16/21 1737)    ED Course  I have reviewed the triage vital signs and the nursing notes.  Pertinent labs & imaging results that were available during my care of the patient were reviewed by me and considered in my medical decision making (see chart for details).    MDM Rules/Calculators/A&P                           JAHKI WITHAM is a 80 y.o. male here presenting with shortness of breath and syncope.  Patient recently had COVID and is on 3 L nasal cannula at baseline.  Patient had a syncopal episode while walking up the stairs today.  Consider possible PE versus MI versus worsening hypoxia.  Patient is satting well on his baseline oxygen of 3 L.  Will get CTA chest and check CBC and CMP and troponin  5:56 PM Patient's potassium is 2.8 and required multiple IV runs.  Troponin is negative.  CT did not show any PE or pneumonia.  Patient was given steroids.  He states that he received his remdesivir dose this morning and wife would like to finish another dose tomorrow to complete a 5-day course.  At this point, given he had a syncopal episode, will readmit to  the hospital   Final Clinical Impression(s) / ED Diagnoses Final diagnoses:  None    Rx / DC Orders ED Discharge Orders     None        Drenda Freeze, MD 11/16/21 1844

## 2021-11-16 NOTE — ED Triage Notes (Signed)
Pt arriving from home via EMS. Pt has dx of COVID and was discharged earlier today. While at home pt had a syncopal episode that lasted 3-4 minutes. Wife assisted pt to ground. Head was not hit. When EMS arrived pt's O2 levels were in the 80s, even on normal 3 L O2 supplement, and pt c/o tightness in his chest. EMS applied non-rebreather temporarily and saturations increased and tightness in chest subsided. Pt denies any chest tightness now. Pt on 3 L [place] currently

## 2021-11-16 NOTE — Telephone Encounter (Signed)
Samantha notified. 

## 2021-11-16 NOTE — ED Notes (Addendum)
Pt unable to finish [facility]. Pt having a hard time sitting up with a bit of dizziness. Pt family member states she would not recommend.

## 2021-11-16 NOTE — Discharge Summary (Signed)
Physician Discharge Summary  Maurice Reed OZH:[id number] DOB: [birth year] DOA: 11/12/2021  PCP: Inda Coke, PA  Admit date: 11/12/2021  Discharge date: 11/16/2021  Admitted From: Home.  Disposition:  Home  Recommendations for Outpatient Follow-up:  Follow up with PCP in 1-2 weeks. Please obtain BMP/CBC in one week. Advised to continue Decadron 6 mg daily for 6 more days to complete 10-day treatment. Patient has completed remdesivir for 4 days.  Home Health: None Equipment/Devices:None  Discharge Condition: Stable CODE STATUS:Full code Diet recommendation: Heart Healthy  Brief [facility] Course: This 80 years old male with PMH significant for [name] on Eliquis, history of PE, GERD, chronic hypoxic respiratory failure on 3 L of supplemental oxygen at baseline, presented with c/o: cough and worsening shortness of breath for 3 days.  Patient also reports having chest congestion,  clamminess associated with decreased p.o. intake.  He reports his wife was COVID-positive so he was concerned and he wanted him to be tested.  his COVID test is positive. Eventually he has increased oxygen use which made to talk to his PCP who advised him to go to the ED.  Chest x-ray shows increased bilateral lung opacities which may reflect pneumonia superimposed on chronic lung disease. Patient was admitted for possible community-acquired pneumonia and started on remdesivir,  Rocephin and Zithromax.  Patient was continued on oxygen and wean down successfully to his baseline oxygen requirement.  Procalcitonin negative x 2.  Antibiotics discontinued.  Patient continued on supportive care for COVID-pneumonia.  Patient has received 4 doses of remdesivir and IV Decadron.  Patient feels better and want to be discharged.  Patient is being discharged home on Decadron 6 mg daily for 6 more days to complete 10-day treatment.  Patient will follow up with primary care physician.   He was managed for below  problems.  Discharge Diagnoses:  Principal Problem:   Pneumonia due to COVID-19 virus  COVID-19 pneumonia: Patient presented with cough, chest congestion,  worsening shortness of breath. He was hypoxic requiring 4 L of oxygen on arrival. Now he is weaned down to his baseline oxygen requirement_0 /min Continue remdesivir x 4 days, steroids and nebulization Continue guanfacine, encouraged incentive spirometry and flutter Procalcitonin negative x 2 so we will stop antibiotics. Continue to monitor inflammatory markers. Patient continues to have ongoing dyspnea on exertion. Patient feels better.  He wants to be discharged.   Atrial fibrillation: Heart rate controlled, continue metoprolol. Continue Eliquis.   COPD: Continue home inhalers and nebulization, Continue supplemental oxygen.   Chronic pain syndrome: Continue oxycodone, Lyrica   HFp EF: Continue torsemide, metoprolol, Aldactone   Seborrheic dermatitis Rosacea: Continue Otezla and doxycycline    Discharge Instructions  Discharge Instructions     Call MD for:  difficulty breathing, headache or visual disturbances   Complete by: As directed    Call MD for:  persistant dizziness or light-headedness   Complete by: As directed    Call MD for:  persistant nausea and vomiting   Complete by: As directed    Diet - low sodium heart healthy   Complete by: As directed    Increase activity slowly   Complete by: As directed    MyChart COVID-19 home monitoring program   Complete by: Nov 16, 2021    Is the patient willing to use the [facility] for home monitoring?: Yes   No wound care   Complete by: As directed    Temperature monitoring   Complete by: Nov 16, 2021  After how many days would you like to receive a notification of this patient's flowsheet entries?: 1      Allergies as of 11/16/2021       Reactions   Other Itching   Cats: watery eyes, itching, sneezing        Medication List     TAKE  these medications    apixaban 5 MG Tabs tablet Commonly known as: Eliquis Take 1 tablet (5 mg total) by mouth 2 (two) times daily.   Atropine Sulfate-NaCl 0.01-0.9 % Soln Place 1 drop into the left eye at bedtime.   clobetasol 0.05 % external solution Commonly known as: TEMOVATE APPLY TO AFFECTED AREA(S)  TOPICALLY ON SCALP 1 TO 2  TIMES DAILY AS NEEDED What changed:  how much to take when to take this reasons to take this additional instructions   desonide 0.05 % lotion Commonly known as: DESOWEN Apply 1 application topically daily as needed (itching).   dexamethasone 6 MG tablet Commonly known as: Decadron Take 1 tablet (6 mg total) by mouth daily for 6 days.   docusate sodium 100 MG capsule Commonly known as: COLACE Take 100 mg by mouth 2 (two) times daily.   doxycycline 100 MG tablet Commonly known as: VIBRA-TABS TAKE 1 TABLET BY MOUTH  TWICE DAILY   erythromycin ophthalmic ointment Place 1 application into both eyes at bedtime.   ipratropium-albuterol 0.5-2.5 (3) MG/3ML Soln Commonly known as: DUONEB Take 3 mLs by nebulization every 6 (six) hours as needed. What changed: reasons to take this   ketorolac 0.5 % ophthalmic solution Commonly known as: ACULAR Place 1 drop into both eyes 4 (four) times daily as needed (irritation).   Lumigan 0.01 % Soln Generic drug: bimatoprost Place 1 drop into the right eye at bedtime.   magnesium oxide 400 MG tablet Commonly known as: MAG-OX TAKE 1 TABLET BY MOUTH TWICE A DAY   metoprolol succinate 25 MG 24 hr tablet Commonly known as: TOPROL-XL TAKE 1 TABLET BY MOUTH  DAILY What changed: how to take this   mometasone 50 MCG/ACT nasal spray Commonly known as: NASONEX Place 2 sprays into the nose daily. What changed:  when to take this reasons to take this   Narcan 4 MG/0.1ML Liqd nasal spray kit Generic drug: naloxone 1 spray once as needed (overdose).   nystatin ointment Commonly known as: MYCOSTATIN Apply 1  application topically 2 (two) times daily. What changed:  when to take this reasons to take this   Otezla 30 MG Tabs Generic drug: Apremilast Take 30 mg by mouth 2 (two) times daily.   oxyCODONE 5 MG immediate release tablet Commonly known as: Oxy IR/ROXICODONE Take 5 mg by mouth 3 (three) times daily as needed for severe pain. for pain   prednisoLONE acetate 1 % ophthalmic suspension Commonly known as: PRED FORTE Place 1 drop into the left eye at bedtime.   pregabalin 50 MG capsule Commonly known as: LYRICA Take 50 mg by mouth 2 (two) times daily.   spironolactone 25 MG tablet Commonly known as: ALDACTONE Take 25 mg by mouth daily.   tadalafil 20 MG tablet Commonly known as: CIALIS Take 0.5-1 tablets (10-20 mg total) by mouth every other day as needed for erectile dysfunction.   tamsulosin 0.4 MG Caps capsule Commonly known as: FLOMAX TAKE 1 CAPSULE BY MOUTH AT  BEDTIME What changed: how to take this   temazepam 15 MG capsule Commonly known as: RESTORIL TAKE 1 CAPSULE (15 MG TOTAL) BY MOUTH AT  BEDTIME AS NEEDED FOR SLEEP. What changed:  how much to take how to take this when to take this   Torsemide 40 MG Tabs Take 40 mg by mouth 2 (two) times daily. What changed:  medication strength how much to take how to take this when to take this additional instructions   TRIAMCINOLONE PO Apply 1 application topically daily as needed (itching).   Vitamin C 500 MG Caps Take 500 mg by mouth daily.   Vitamin D3 75 MCG (3000 UT) Tabs Take 3,000 Units by mouth daily.   zinc gluconate 50 MG tablet Take 50 mg by mouth daily.        Follow-up Information     Inda Coke, [place] Follow up in 1 week(s).   Specialty: Physician Assistant Contact information: [place] [address] [phone]         Lorretta Harp, MD .   Specialties: Cardiology, Radiology Contact information: [address] [place]  [address] [phone]                Allergies  Allergen Reactions   Other Itching    Cats: watery eyes, itching, sneezing    Consultations: None   Procedures/Studies: DG Chest Portable 1 View  Result Date: 11/12/2021 CLINICAL DATA:  Shortness of breath.  COVID positive. EXAM: PORTABLE CHEST 1 VIEW COMPARISON:  Chest radiograph and CTA 12/17/2019 FINDINGS: The cardiac silhouette remains enlarged. Aortic atherosclerosis is noted. Extensive bilateral pleural calcification and pleural thickening are again seen. Coarse bilateral interstitial densities have increased from the prior study, and there are also scattered new patchy opacities bilaterally. No large pleural effusion or pneumothorax is identified. IMPRESSION: Increased bilateral lung opacities which may reflect pneumonia superimposed on chronic lung disease. Electronically Signed   By: Logan Bores M.D.   On: 11/12/2021 20:25       Subjective: Patient was seen and examined at bedside.  Overnight events noted.   Patient reports feeling much improved.He wants to be discharged.  Patient is back to his baseline oxygen requirement.   Patient is being discharged home.  Discharge Exam: Vitals:   11/15/21 2050 11/16/21 0438  BP: (!) 143/83 (!) 123/58  Pulse: 81 65  Resp: 18 20  Temp: 98.3 F (36.8 C) 98.6 F (37 C)  SpO2: 99% 96%   Vitals:   11/15/21 0530 11/15/21 1545 11/15/21 2050 11/16/21 0438  BP: 106/69 123/85 (!) 143/83 (!) 123/58  Pulse: 76 88 81 65  Resp: _0 Temp: 98.4 F (36.9 C) 98.1 F (36.7 C) 98.3 F (36.8 C) 98.6 F (37 C)  TempSrc: Oral Oral Oral Oral  SpO2: 97% 100% 99% 96%  Weight:      Height:        General: Pt is alert, awake, not in acute distress Cardiovascular: RRR, S1/S2 +, no rubs, no gallops Respiratory: CTA bilaterally, no wheezing, no rhonchi Abdominal: Soft, NT, ND, bowel sounds + Extremities: no edema, no cyanosis    The results of significant diagnostics from this  hospitalization (including imaging, microbiology, ancillary and laboratory) are listed below for reference.     Microbiology: Recent Results (from the past 240 hour(s))  Resp Panel by RT-PCR (Flu A&B, Covid) Nasopharyngeal Swab     Status: Abnormal   Collection Time: 11/12/21  6:51 PM   Specimen: Nasopharyngeal Swab; Nasopharyngeal(NP) swabs in vial transport medium  Result Value Ref Range Status   SARS Coronavirus 2 by RT PCR POSITIVE (A)  NEGATIVE Final    Comment: RESULT CALLED TO, READ BACK BY AND VERIFIED WITH: Algis Downs, rn 11/12/21 1937 src (NOTE) SARS-CoV-2 target nucleic acids are DETECTED.  The SARS-CoV-2 RNA is generally detectable in upper respiratory specimens during the acute phase of infection. Positive results are indicative of the presence of the identified virus, but do not rule out bacterial infection or co-infection with other pathogens not detected by the test. Clinical correlation with patient history and other diagnostic information is necessary to determine patient infection status. The expected result is Negative.  Fact Sheet for Patients: EntrepreneurPulse.com.au  Fact Sheet for Healthcare Providers: IncredibleEmployment.be  This test is not yet approved or cleared by the [place] FDA and  has been authorized for detection and/or diagnosis of SARS-CoV-2 by FDA under an Emergency Use Authorization (EUA).  This EUA will remain in effect (meaning this test can be  used) for the duration of  the COVID-19 declaration under Section 564(b)(1) of the Act, 21 U.S.C. section 360bbb-3(b)(1), unless the authorization is terminated or revoked sooner.     Influenza A by PCR NEGATIVE NEGATIVE Final   Influenza B by PCR NEGATIVE NEGATIVE Final    Comment: (NOTE) The Xpert Xpress SARS-CoV-2/FLU/RSV plus assay is intended as an aid in the diagnosis of influenza from Nasopharyngeal swab specimens and should not be used as a  sole basis for treatment. Nasal washings and aspirates are unacceptable for Xpert Xpress SARS-CoV-2/FLU/RSV testing.  Fact Sheet for Patients: EntrepreneurPulse.com.au  Fact Sheet for Healthcare Providers: IncredibleEmployment.be  This test is not yet approved or cleared by the [place] FDA and has been authorized for detection and/or diagnosis of SARS-CoV-2 by FDA under an Emergency Use Authorization (EUA). This EUA will remain in effect (meaning this test can be used) for the duration of the COVID-19 declaration under Section 564(b)(1) of the Act, 21 U.S.C. section 360bbb-3(b)(1), unless the authorization is terminated or revoked.  Performed at [facility], [address] [place], [place] [address]   Blood Culture (routine x 2)     Status: None (Preliminary result)   Collection Time: 11/13/21  2:12 PM   Specimen: BLOOD  Result Value Ref Range Status   Specimen Description   Final    BLOOD LEFT ANTECUBITAL Performed at [facility] [address] [place], [place] [address]    Special Requests   Final    BOTTLES DRAWN AEROBIC ONLY Blood Culture adequate volume Performed at [facility] [address], [place], [place] [address]    Culture   Final    NO GROWTH 3 DAYS Performed at [facility], [place] [address] [place], [place] [address]    Report Status PENDING  Incomplete  Blood Culture (routine x 2)     Status: None (Preliminary result)   Collection Time: 11/13/21  2:12 PM   Specimen: BLOOD  Result Value Ref Range Status   Specimen Description   Final    BLOOD RIGHT ANTECUBITAL Performed at [facility] [address], [place], [place] [address]    Special Requests   Final    BOTTLES DRAWN AEROBIC ONLY Blood Culture adequate volume Performed at [facility] [address], [place], [place] [address]    Culture   Final     NO GROWTH 3 DAYS Performed at [facility], [place] [address] [place], [place] [address]    Report Status PENDING  Incomplete  Gastrointestinal Panel by PCR ,  Stool     Status: None   Collection Time: 11/13/21  7:29 PM   Specimen: Stool  Result Value Ref Range Status   Campylobacter species NOT DETECTED NOT DETECTED Final   Plesimonas shigelloides NOT DETECTED NOT DETECTED Final   Salmonella species NOT DETECTED NOT DETECTED Final   Yersinia enterocolitica NOT DETECTED NOT DETECTED Final   Vibrio species NOT DETECTED NOT DETECTED Final   Vibrio cholerae NOT DETECTED NOT DETECTED Final   Enteroaggregative E coli (EAEC) NOT DETECTED NOT DETECTED Final   Enteropathogenic E coli (EPEC) NOT DETECTED NOT DETECTED Final   Enterotoxigenic E coli (ETEC) NOT DETECTED NOT DETECTED Final   Shiga like toxin producing E coli (STEC) NOT DETECTED NOT DETECTED Final   Shigella/Enteroinvasive E coli (EIEC) NOT DETECTED NOT DETECTED Final   Cryptosporidium NOT DETECTED NOT DETECTED Final   Cyclospora cayetanensis NOT DETECTED NOT DETECTED Final   Entamoeba histolytica NOT DETECTED NOT DETECTED Final   Giardia lamblia NOT DETECTED NOT DETECTED Final   Adenovirus F40/41 NOT DETECTED NOT DETECTED Final   Astrovirus NOT DETECTED NOT DETECTED Final   Norovirus GI/GII NOT DETECTED NOT DETECTED Final   Rotavirus A NOT DETECTED NOT DETECTED Final   Sapovirus (I, II, IV, and V) NOT DETECTED NOT DETECTED Final    Comment: Performed at [facility], [address] [place], [place] [address]  C Difficile Quick Screen w PCR reflex     Status: None   Collection Time: 11/13/21  7:29 PM   Specimen: STOOL  Result Value Ref Range Status   C Diff antigen NEGATIVE NEGATIVE Final   C Diff toxin NEGATIVE NEGATIVE Final   C Diff interpretation NEGATIVE  Final    Comment: Performed at [facility], [place] [address] [place], [place] [address]     Labs: BNP (last 3 results) Recent Labs     11/12/21 1931  BNP 086.7*   Basic Metabolic Panel: Recent Labs  Lab 11/12/21 1931 11/13/21 0736 11/14/21 0405 11/15/21 0347 11/16/21 0402  NA 140 141 139 139 137  K 3.5 3.5 3.8 3.3* 3.7  CL 96* 99 96* 96* 92*  CO2 37* 37* 33* 35* 33*  GLUCOSE 90 95 129* 139* 151*  BUN _0 25*  CREATININE 0.91 0.78 0.88 0.94 0.93  CALCIUM 9.6 9.1 8.6* 8.8* 8.9  MG  --   --   --   --  1.5*  PHOS  --   --   --   --  3.8   Liver Function Tests: Recent Labs  Lab 11/12/21 1931 11/13/21 0736 11/14/21 0405 11/15/21 0347 11/16/21 0402  AST _1 ALT _2 ALKPHOS 86 82 89 80 83  BILITOT 0.3 0.3 0.5 0.6 0.5  PROT 6.9 6.4* 6.9 6.8 6.9  ALBUMIN 3.6 3.4* 3.4* 3.4* 3.4*   No results for input(s): LIPASE, AMYLASE in the last 168 hours. No results for input(s): AMMONIA in the last 168 hours. CBC: Recent Labs  Lab 11/12/21 1931 11/13/21 0736 11/14/21 0405 11/15/21 0347 11/16/21 0402  WBC 4.2 4.9 3.0* 3.7* 4.3  NEUTROABS 1.8 2.7 2.0 2.3 3.0  HGB 12.7* 11.8* 13.8 13.1 14.3  HCT 39.7 36.2* 40.9 39.0 42.5  MCV 105.9* 105.8* 102.5* 101.6* 100.2*  PLT 182 170 133* 211 211   Cardiac Enzymes: No results for input(s): CKTOTAL, CKMB, CKMBINDEX, TROPONINI in the last 168 hours. BNP: Invalid input(s): POCBNP CBG: No results for  input(s): GLUCAP in the last 168 hours. D-Dimer Recent Labs    11/15/21 0347 11/16/21 0402  DDIMER 0.79* 0.67*   Hgb A1c No results for input(s): HGBA1C in the last 72 hours. Lipid Profile No results for input(s): CHOL, HDL, LDLCALC, TRIG, CHOLHDL, LDLDIRECT in the last 72 hours. Thyroid function studies No results for input(s): TSH, T4TOTAL, T3FREE, THYROIDAB in the last 72 hours.  Invalid input(s): FREET3 Anemia work up Recent Labs    11/15/21 0347 11/16/21 0402  FERRITIN 756* 982*   Urinalysis    Component Value Date/Time   BILIRUBINUR Negative 09/08/2020 1550   PROTEINUR Negative 09/08/2020 1550   UROBILINOGEN 0.2  09/08/2020 1550   NITRITE Negative 09/08/2020 1550   LEUKOCYTESUR Moderate (2+) (A) 09/08/2020 1550   Sepsis Labs Invalid input(s): PROCALCITONIN,  WBC,  LACTICIDVEN Microbiology Recent Results (from the past 240 hour(s))  Resp Panel by RT-PCR (Flu A&B, Covid) Nasopharyngeal Swab     Status: Abnormal   Collection Time: 11/12/21  6:51 PM   Specimen: Nasopharyngeal Swab; Nasopharyngeal(NP) swabs in vial transport medium  Result Value Ref Range Status   SARS Coronavirus 2 by RT PCR POSITIVE (A) NEGATIVE Final    Comment: RESULT CALLED TO, READ BACK BY AND VERIFIED WITH: jaimie oneal, rn 11/12/21 1937 src (NOTE) SARS-CoV-2 target nucleic acids are DETECTED.  The SARS-CoV-2 RNA is generally detectable in upper respiratory specimens during the acute phase of infection. Positive results are indicative of the presence of the identified virus, but do not rule out bacterial infection or co-infection with other pathogens not detected by the test. Clinical correlation with patient history and other diagnostic information is necessary to determine patient infection status. The expected result is Negative.  Fact Sheet for Patients: EntrepreneurPulse.com.au  Fact Sheet for Healthcare Providers: IncredibleEmployment.be  This test is not yet approved or cleared by the [place] FDA and  has been authorized for detection and/or diagnosis of SARS-CoV-2 by FDA under an Emergency Use Authorization (EUA).  This EUA will remain in effect (meaning this test can be  used) for the duration of  the COVID-19 declaration under Section 564(b)(1) of the Act, 21 U.S.C. section 360bbb-3(b)(1), unless the authorization is terminated or revoked sooner.     Influenza A by PCR NEGATIVE NEGATIVE Final   Influenza B by PCR NEGATIVE NEGATIVE Final    Comment: (NOTE) The Xpert Xpress SARS-CoV-2/FLU/RSV plus assay is intended as an aid in the diagnosis of influenza from  Nasopharyngeal swab specimens and should not be used as a sole basis for treatment. Nasal washings and aspirates are unacceptable for Xpert Xpress SARS-CoV-2/FLU/RSV testing.  Fact Sheet for Patients: EntrepreneurPulse.com.au  Fact Sheet for Healthcare Providers: IncredibleEmployment.be  This test is not yet approved or cleared by the [place] FDA and has been authorized for detection and/or diagnosis of SARS-CoV-2 by FDA under an Emergency Use Authorization (EUA). This EUA will remain in effect (meaning this test can be used) for the duration of the COVID-19 declaration under Section 564(b)(1) of the Act, 21 U.S.C. section 360bbb-3(b)(1), unless the authorization is terminated or revoked.  Performed at [facility], [address] [place], [place] [address]   Blood Culture (routine x 2)     Status: None (Preliminary result)   Collection Time: 11/13/21  2:12 PM   Specimen: BLOOD  Result Value Ref Range Status   Specimen Description   Final    BLOOD LEFT ANTECUBITAL Performed at [facility] [address] [place], [place]  [address]    Special Requests   Final    BOTTLES DRAWN AEROBIC ONLY Blood Culture adequate volume Performed at [facility] [address], [place], [place] [address]    Culture   Final    NO GROWTH 3 DAYS Performed at [facility], [place] [address], [place], [place] [address]    Report Status PENDING  Incomplete  Blood Culture (routine x 2)     Status: None (Preliminary result)   Collection Time: 11/13/21  2:12 PM   Specimen: BLOOD  Result Value Ref Range Status   Specimen Description   Final    BLOOD RIGHT ANTECUBITAL Performed at [facility] [address] [place], [place] [address]    Special Requests   Final    BOTTLES DRAWN AEROBIC ONLY Blood Culture adequate volume Performed at [facility]  [address], [place], [place] [address]    Culture   Final    NO GROWTH 3 DAYS Performed at [facility], [place] [address] [place], [place] [address]    Report Status PENDING  Incomplete  Gastrointestinal Panel by PCR , Stool     Status: None   Collection Time: 11/13/21  7:29 PM   Specimen: Stool  Result Value Ref Range Status   Campylobacter species NOT DETECTED NOT DETECTED Final   Plesimonas shigelloides NOT DETECTED NOT DETECTED Final   Salmonella species NOT DETECTED NOT DETECTED Final   Yersinia enterocolitica NOT DETECTED NOT DETECTED Final   Vibrio species NOT DETECTED NOT DETECTED Final   Vibrio cholerae NOT DETECTED NOT DETECTED Final   Enteroaggregative E coli (EAEC) NOT DETECTED NOT DETECTED Final   Enteropathogenic E coli (EPEC) NOT DETECTED NOT DETECTED Final   Enterotoxigenic E coli (ETEC) NOT DETECTED NOT DETECTED Final   Shiga like toxin producing E coli (STEC) NOT DETECTED NOT DETECTED Final   Shigella/Enteroinvasive E coli (EIEC) NOT DETECTED NOT DETECTED Final   Cryptosporidium NOT DETECTED NOT DETECTED Final   Cyclospora cayetanensis NOT DETECTED NOT DETECTED Final   Entamoeba histolytica NOT DETECTED NOT DETECTED Final   Giardia lamblia NOT DETECTED NOT DETECTED Final   Adenovirus F40/41 NOT DETECTED NOT DETECTED Final   Astrovirus NOT DETECTED NOT DETECTED Final   Norovirus GI/GII NOT DETECTED NOT DETECTED Final   Rotavirus A NOT DETECTED NOT DETECTED Final   Sapovirus (I, II, IV, and V) NOT DETECTED NOT DETECTED Final    Comment: Performed at [facility], [address] [place], [place] [address]  C Difficile Quick Screen w PCR reflex     Status: None   Collection Time: 11/13/21  7:29 PM   Specimen: STOOL  Result Value Ref Range Status   C Diff antigen NEGATIVE NEGATIVE Final   C Diff toxin NEGATIVE NEGATIVE Final   C Diff interpretation NEGATIVE  Final    Comment: Performed at [facility], [place] [address]  [place], [place] [address]     Time coordinating discharge: Over 30 minutes  SIGNED:   Shawna Clamp, MD  [facility] 11/16/2021, 12:22 PM Pager   If 7PM-7AM, please contact night-coverage

## 2021-11-16 NOTE — H&P (Signed)
History and Physical    Maurice Reed TTS:[id number] DOB: 12-10-1940 DOA: 11/16/2021  PCP: Inda Coke, PA  Patient coming from: Home  I have personally briefly reviewed patient's old medical records in [place]  Chief Complaint: Syncope  HPI: Maurice Reed is a 80 y.o. male with medical history significant for permanent atrial fibrillation on Eliquis, emphysema/ILD, chronic respiratory failure with hypoxia on 3 L O2 at rest and 4-5 L with exertion, HFpEF (EF 55% by TTE 09/30/2021), history of PE, HTN, chronic pain syndrome, and OSA on BiPAP who presented to the ED for evaluation of syncope.  Patient recently admitted 12/9 and discharged earlier today (12/13) for COVID-19 pneumonia.  He was treated with IV remdesivir (completed 4 days), steroids, nebulizers.  He was discharged with prescription for oral Decadron 6 mg daily to complete 10-day total course of steroids.  He was on his home 3 L O2 via [place] at time of discharge.    Patient states he was feeling okay at time of discharge other than some weakness.  He was walking up the back stairs into his home when he suddenly became lightheaded and felt as if he was going to pass out.  He felt as if his legs were getting give out.  He then verbalized to his spouse that he was passing out and she was able to brace him and guided him to the ground.  He did not hit his head but his glasses did cause a mild abrasion above his right eye.  His wife says that he was unresponsive for approximately 3-4 minutes despite her attempts to arouse him.  He was laying face down and it was difficult for her to assess his respirations but she did begin to see him breathing after a while.  She noted that he was clammy and had some shaking of his legs.  When he awoke he was complaining of chest tightness.  He was not really confused.  His wife.  EMS were called and he was noted to be in atrial fibrillation with variable heart rate.  He was subsequently brought to  the ED for further evaluation.  He denies any prior syncopal episode.  ED Course:  Initial vitals showed BP 141/94, pulse 104, RR 26, temp 97.8 F, SPO2 99% on room air.  Labs show sodium 141 potassium 2.8, magnesium 1.8, bicarb 34, BUN 24, creatinine 0.91, serum glucose 95, LFTs within normal limits, WBC 9.2, hemoglobin 14.7, platelets 229,000, troponin 7.  CTA chest negative for evidence of PE.  Mosaic groundglass attenuation in both lungs seen.  Calcified pleural plaques bilaterally noted.  Patient was given oral potassium 40 mEq and IV potassium 10 mEq x 2 and IV Decadron 6 mg once.  The hospitalist service was consulted to admit for further evaluation and management.  Review of Systems: All systems reviewed and are negative except as documented in history of present illness above.   Past Medical History:  Diagnosis Date   Arthritis    Atrial fibrillation ([place])    Blind left eye 2005   was a result of cataract surgery   Diverticula, bladder    Erectile dysfunction    GERD (gastroesophageal reflux disease)    Kidney stones    Obesity    Pulmonary embolism [facility])     Past Surgical History:  Procedure Laterality Date   [place]  2003   [place]  UMBILICAL HERNIA REPAIR  1984    Social History:  reports that he quit smoking about 36 years ago. His smoking use included cigarettes. He has a 75.00 pack-year smoking history. He has never used smokeless tobacco. He reports current alcohol use of about 1.0 standard drink per week. He reports that he does not currently use drugs after having used the following drugs: Marijuana.  Allergies  Allergen Reactions   Other Itching    Cats: watery eyes, itching, sneezing    Family History  Problem Relation Age of Onset   Arthritis Mother    Hearing loss Mother    Hypertension Mother    Heart disease Mother    Stroke Mother    Arthritis Father    Hearing  loss Father    Heart disease Father    Hypertension Father    Heart attack Father    Kidney disease Father    Stroke Father      Prior to Admission medications   Medication Sig Start Date End Date Taking? Authorizing Provider  apixaban (ELIQUIS) 5 MG TABS tablet Take 1 tablet (5 mg total) by mouth 2 (two) times daily. 09/27/21  Yes Worley, Aldona Bar, PA  Apremilast (OTEZLA) 30 MG TABS Take 30 mg by mouth 2 (two) times daily.   Yes [provider]  metoprolol succinate (TOPROL-XL) 25 MG 24 hr tablet TAKE 1 TABLET BY MOUTH  DAILY Patient taking differently: 25 mg daily. 03/17/21  Yes Inda Coke, PA  torsemide 40 MG TABS Take 40 mg by mouth 2 (two) times daily. 11/16/21  Yes Shawna Clamp, MD  Ascorbic Acid (VITAMIN C) 500 MG CAPS Take 500 mg by mouth daily.    [provider]  Atropine Sulfate-NaCl 0.01-0.9 % SOLN Place 1 drop into the left eye at bedtime.    [provider]  bimatoprost (LUMIGAN) 0.01 % SOLN Place 1 drop into the right eye at bedtime.    [provider]  Cholecalciferol (VITAMIN D3) 3000 units TABS Take 3,000 Units by mouth daily.    [provider]  clobetasol (TEMOVATE) 0.05 % external solution APPLY TO AFFECTED AREA(S)  TOPICALLY ON SCALP 1 TO 2  TIMES DAILY AS NEEDED Patient taking differently: 1 application 2 (two) times daily as needed (for scalp). 09/06/21   Inda Coke, PA  desonide (DESOWEN) 0.05 % lotion Apply 1 application topically daily as needed (itching). 07/20/12   [provider]  dexamethasone (DECADRON) 6 MG tablet Take 1 tablet (6 mg total) by mouth daily for 6 days. 11/16/21 11/22/21  Shawna Clamp, MD  docusate sodium (COLACE) 100 MG capsule Take 100 mg by mouth 2 (two) times daily.    [provider]  doxycycline (VIBRA-TABS) 100 MG tablet TAKE 1 TABLET BY MOUTH  TWICE DAILY Patient taking differently: Take 100 mg by mouth 2 (two) times daily. 06/21/21   Vivi Barrack, MD   erythromycin ophthalmic ointment Place 1 application into both eyes at bedtime. 05/01/18   [provider]  ipratropium-albuterol (DUONEB) 0.5-2.5 (3) MG/3ML SOLN Take 3 mLs by nebulization every 6 (six) hours as needed. Patient taking differently: Take 3 mLs by nebulization every 6 (six) hours as needed (wheezing). 11/12/21   Inda Coke, PA  ketorolac (ACULAR) 0.5 % ophthalmic solution Place 1 drop into both eyes 4 (four) times daily as needed (irritation). 03/18/21   [provider]  magnesium oxide (MAG-OX) 400 MG tablet TAKE 1 TABLET BY MOUTH TWICE A DAY Patient taking differently: Take 400  mg by mouth 2 (two) times daily. 08/30/21   Inda Coke, PA  mometasone (NASONEX) 50 MCG/ACT nasal spray Place 2 sprays into the nose daily. Patient taking differently: Place 2 sprays into the nose daily as needed (congestion). 03/05/19   Rigoberto Noel, MD  NARCAN 4 MG/0.1ML LIQD nasal spray kit 1 spray once as needed (overdose). 08/22/18   [provider]  nystatin ointment (MYCOSTATIN) Apply 1 application topically 2 (two) times daily. Patient taking differently: Apply 1 application topically daily as needed (skinn irritation). 09/22/20   Inda Coke, PA  oxyCODONE (OXY IR/ROXICODONE) 5 MG immediate release tablet Take 5 mg by mouth 3 (three) times daily as needed for severe pain. for pain 04/23/18   [provider]  prednisoLONE acetate (PRED FORTE) 1 % ophthalmic suspension Place 1 drop into the left eye at bedtime. 04/25/19   [provider]  pregabalin (LYRICA) 50 MG capsule Take 50 mg by mouth 2 (two) times daily.    Jeanella Anton, NP  spironolactone (ALDACTONE) 25 MG tablet Take 25 mg by mouth daily. 09/27/21   [provider]  tadalafil (CIALIS) 20 MG tablet Take 0.5-1 tablets (10-20 mg total) by mouth every other day as needed for erectile dysfunction. 03/01/21   Inda Coke, PA  tamsulosin (FLOMAX) 0.4 MG CAPS capsule TAKE 1  CAPSULE BY MOUTH AT  BEDTIME Patient taking differently: 0.4 mg at bedtime. 04/26/21   Inda Coke, PA  temazepam (RESTORIL) 15 MG capsule TAKE 1 CAPSULE (15 MG TOTAL) BY MOUTH AT BEDTIME AS NEEDED FOR SLEEP. Patient taking differently: Take 15 mg by mouth at bedtime. TAKE 1 CAPSULE (15 MG TOTAL) BY MOUTH AT BEDTIME AS NEEDED FOR SLEEP. 06/02/21   Vivi Barrack, MD  TRIAMCINOLONE PO Apply 1 application topically daily as needed (itching).    [provider]  zinc gluconate 50 MG tablet Take 50 mg by mouth daily.    [provider]    Physical Exam: Vitals:   11/16/21 1830 11/16/21 1915 11/16/21 1930 11/16/21 2015  BP: 124/87 139/78 132/87 (!) 141/102  Pulse: 97 81 84 99  Resp: (!) 26 (!) 22 (!) 23 (!) 28  Temp:      TempSrc:      SpO2: 100% 100% 99% 98%  Weight:      Height:       Constitutional: Resting supine in bed, NAD, calm, comfortable Eyes: Right pupil reactive to light, left pupil opacified, lids and conjunctivae normal ENMT: Mucous membranes are moist. Posterior pharynx clear of any exudate or lesions.Normal dentition.  Neck: normal, supple, no masses. Respiratory: Bibasilar inspiratory crackles. Normal respiratory effort while on 3 L O2 via [place]. No accessory muscle use.  Cardiovascular: Irregularly irregular, no murmurs / rubs / gallops. No extremity edema. 2+ pedal pulses. Abdomen: no tenderness, no masses palpated. No hepatosplenomegaly.  Musculoskeletal: no clubbing / cyanosis. No joint deformity upper and lower extremities. Good ROM, no contractures. Normal muscle tone.  Skin: no rashes, lesions, ulcers. No induration Neurologic: CN 2-12 grossly intact. Sensation intact. Strength 5/5 in all 4.  Psychiatric: Normal judgment and insight. Alert and oriented x 3. Normal mood.   Labs on Admission: I have personally reviewed following labs and imaging studies  CBC: Recent Labs  Lab 11/13/21 0736 11/14/21 0405 11/15/21 0347 11/16/21 0402  11/16/21 1623 11/16/21 1645  WBC 4.9 3.0* 3.7* 4.3 9.2  --   NEUTROABS 2.7 2.0 2.3 3.0 6.9  --   HGB 11.8* 13.8 13.1 14.3  14.7 15.0  HCT 36.2* 40.9 39.0 42.5 44.4 44.0  MCV 105.8* 102.5* 101.6* 100.2* 102.3*  --   PLT 170 133* 211 211 229  --    Basic Metabolic Panel: Recent Labs  Lab 11/13/21 0736 11/14/21 0405 11/15/21 0347 11/16/21 0402 11/16/21 1623 11/16/21 1645  NA 141 139 139 137 141 140  K 3.5 3.8 3.3* 3.7 2.8* 3.0*  CL 99 96* 96* 92* 96* 93*  CO2 37* 33* 35* 33* 34*  --   GLUCOSE 95 129* 139* 151* 95 98  BUN '10 16 20 ' 25* 24* 24*  CREATININE 0.78 0.88 0.94 0.93 0.91 1.00  CALCIUM 9.1 8.6* 8.8* 8.9 8.0*  --   MG  --   --   --  1.5* 1.8  --   PHOS  --   --   --  3.8  --   --    GFR: Estimated Creatinine Clearance: 78.3 mL/min (by C-G formula based on SCr of 1 mg/dL). Liver Function Tests: Recent Labs  Lab 11/13/21 0736 11/14/21 0405 11/15/21 0347 11/16/21 0402 11/16/21 1623  AST '18 22 20 23 29  ' ALT '11 15 14 15 19  ' ALKPHOS 82 89 80 83 81  BILITOT 0.3 0.5 0.6 0.5 0.5  PROT 6.4* 6.9 6.8 6.9 6.7  ALBUMIN 3.4* 3.4* 3.4* 3.4* 3.2*   No results for input(s): LIPASE, AMYLASE in the last 168 hours. No results for input(s): AMMONIA in the last 168 hours. Coagulation Profile: No results for input(s): INR, PROTIME in the last 168 hours. Cardiac Enzymes: No results for input(s): CKTOTAL, CKMB, CKMBINDEX, TROPONINI in the last 168 hours. BNP (last 3 results) No results for input(s): PROBNP in the last 8760 hours. HbA1C: No results for input(s): HGBA1C in the last 72 hours. CBG: No results for input(s): GLUCAP in the last 168 hours. Lipid Profile: No results for input(s): CHOL, HDL, LDLCALC, TRIG, CHOLHDL, LDLDIRECT in the last 72 hours. Thyroid Function Tests: No results for input(s): TSH, T4TOTAL, FREET4, T3FREE, THYROIDAB in the last 72 hours. Anemia Panel: Recent Labs    11/15/21 0347 11/16/21 0402  FERRITIN 756* 982*   Urine analysis:    Component  Value Date/Time   BILIRUBINUR Negative 09/08/2020 1550   PROTEINUR Negative 09/08/2020 1550   UROBILINOGEN 0.2 09/08/2020 1550   NITRITE Negative 09/08/2020 1550   LEUKOCYTESUR Moderate (2+) (A) 09/08/2020 1550    Radiological Exams on Admission: CT Angio Chest PE W and/or Wo Contrast  Result Date: 11/16/2021 CLINICAL DATA:  COVID positive.  Pulmonary embolism suspected. EXAM: CT ANGIOGRAPHY CHEST WITH CONTRAST TECHNIQUE: Multidetector CT imaging of the chest was performed using the standard protocol during bolus administration of intravenous contrast. Multiplanar CT image reconstructions and MIPs were obtained to evaluate the vascular anatomy. CONTRAST:  131m OMNIPAQUE IOHEXOL 350 MG/ML SOLN COMPARISON:  12/17/2019 FINDINGS: Cardiovascular: Heart is enlarged. No substantial pericardial effusion. Coronary artery calcification is evident. Moderate atherosclerotic calcification is noted in the wall of the thoracic aorta. There is no filling defect within the opacified pulmonary arteries to suggest the presence of an acute pulmonary embolus. Mediastinum/Nodes: No mediastinal lymphadenopathy. There is no hilar lymphadenopathy. The esophagus has normal imaging features. There is no axillary lymphadenopathy. Lungs/Pleura: Mosaic ground-glass attenuation in both lungs is nonspecific but likely reflects air trapping. Calcified pleural plaques bilaterally are consistent with prior asbestos exposure. Architectural distortion and scarring noted in the peripheral lungs bilaterally. Upper Abdomen: Unremarkable. Musculoskeletal: No worrisome lytic or sclerotic osseous abnormality. Review of the MIP images  confirms the above findings. IMPRESSION: 1. No CT evidence for acute pulmonary embolus. 2. Mosaic ground-glass attenuation in both lungs is nonspecific but likely reflects air trapping. 3. Calcified pleural plaques bilaterally consistent with prior asbestos exposure. Peripheral architectural distortion/scarring is  compatible with asbestos related lung disease. 4. Aortic Atherosclerosis (ICD10-I70.0). Electronically Signed   By: Misty Stanley M.D.   On: 11/16/2021 17:09   DG Chest Port 1 View  Result Date: 11/16/2021 CLINICAL DATA:  Shortness of breath EXAM: PORTABLE CHEST 1 VIEW COMPARISON:  11/12/2021 FINDINGS: Cardiomegaly, vascular congestion. Bilateral interstitial and airspace opacities, which could reflect edema or infection superimposed on chronic lung disease. Extensive calcified pleural plaques on the left and right lung base. Fall no visible effusions. No acute bony abnormality. IMPRESSION: Cardiomegaly with vascular congestion. Bilateral interstitial/alveolar opacities could reflect edema or infection. Electronically Signed   By: Rolm Baptise M.D.   On: 11/16/2021 17:09    EKG: Personally reviewed. Atrial fibrillation, rate 113, incomplete RBBB, QTC 538.  When compared to prior rate is faster, QTC more prolonged.  PVC no longer present.  Assessment/Plan Principal Problem:   Syncope Active Problems:   Essential hypertension   Atrial fibrillation (HCC)   ILD (interstitial lung disease) (HCC)   Chronic respiratory failure with hypoxia (HCC)   Hypokalemia   (HFpEF) heart failure with preserved ejection fraction (HCC)   OSA treated with BiPAP   Maurice Reed is a 80 y.o. male with medical history significant for permanent atrial fibrillation on Eliquis, emphysema/ILD, chronic respiratory failure with hypoxia on 3 L O2 at rest and 4-5 L with exertion, HFpEF (EF 55% by TTE 09/30/2021), history of PE, HTN, chronic pain syndrome, and OSA on BiPAP who is admitted for syncope evaluation.  Syncope: Patient presenting after syncopal event at home.  Suspect orthostatic versus vasovagal however cardiogenic also in differential.  He was unable to complete orthostatic vitals in the ED due to dizziness on positional change.  He does appear somewhat volume depleted.  Troponin negative x2.  EKG shows atrial  fibrillation with rate 113, no acute ischemic changes. -Start gentle IV fluid hydration with IV NS at 75 mL/Hour overnight -Reattempt orthostatic vitals in a.m., request PT/OT eval -Update echocardiogram -Hold home torsemide for now -Monitor on telemetry  Hypokalemia: Potassium 2.8 on admission.  IV and oral supplement given in the ED. Continue oral potassium and magnesium daily supplement.  COVID-19 viral infection: Admitted 12/9 with COVID-19 pneumonia.  Treated with IV remdesivir and steroids.  Currently stable and saturating well on home 3 L O2 via [place].  Continue oral Decadron 6 mg daily to complete 10-day total steroid course.  Continue precautions.  Persistent atrial fibrillation: Remains in atrial fibrillation, rate controlled.  CHA2DS2-VASc score is 6.  Continue Toprol-XL and Eliquis.  Emphysema/ILD Chronic respiratory failure with hypoxia: Stable on home 3 L O2 via [place].  Uses 4-5 L with exertion.  Continue DuoNeb and albuterol as needed.  Continue Decadron as above.  HFpEF: Appear somewhat volume depleted.  Last EF 55% by TTE 09/30/2021 at [facility].  Placed on gentle IV fluid hydration as above.  Holding torsemide for now.  Monitor strict I/O's and daily weights.  Hypertension: Holding torsemide and spironolactone for now given concern for orthostatic hypotension.  Continue Toprol-XL.  OSA: Continue BiPAP nightly.  Chronic pain: Continue home Oxy IR as needed and Lyrica.  Seborrheic dermatitis/rosacea: Continue Otezla and doxycycline.  DVT prophylaxis: Eliquis Code Status: DNR, confirmed on admission Family Communication: Discussed with patient's spouse  at bedside Disposition Plan: From home and likely discharge to home pending clinical progress Consults called: None Level of care: Telemetry Admission status:  Status is: Observation  The patient remains OBS appropriate and will d/c before 2 midnights.  Zada Finders MD [facility]  If 7PM-7AM, please contact  night-coverage [email or web address]  11/16/2021, 8:46 PM

## 2021-11-16 NOTE — TOC Transition Note (Signed)
Transition of Care [facility]) - CM/SW Discharge Note   Patient Details  Name: KAVIR SAVOCA MRN: [id number] Date of Birth: September 11, 1941  [facility]) CM/SW Contact:  Golda Acre, RN Phone Number: 11/16/2021, 12:29 PM   Clinical Narrative:    Patient dcd to home with no toc needs   Final next level of care: Home/Self Care Barriers to Discharge: Barriers Resolved   Patient Goals and CMS Choice Patient states their goals for this hospitalization and ongoing recovery are:: to go home CMS Medicare.gov Compare Post Acute Care list provided to:: Patient Choice offered to / list presented to : Patient  Discharge Placement                       Discharge Plan and Services   Discharge Planning Services: CM Consult                                 Social Determinants of Health (SDOH) Interventions     Readmission Risk Interventions No flowsheet data found.

## 2021-11-16 NOTE — Discharge Instructions (Signed)
Advised to follow-up with primary care physician in 1 week. Advised to continue Decadron 6 mg daily for 6 more days to complete 10-day treatment. Patient has completed remdesivir for 4 days.

## 2021-11-17 ENCOUNTER — Observation Stay (HOSPITAL_COMMUNITY): Payer: Medicare Other

## 2021-11-17 ENCOUNTER — Inpatient Hospital Stay (HOSPITAL_COMMUNITY)
Admit: 2021-11-17 | Discharge: 2021-11-17 | Disposition: A | Discharge status: Home / Self Care | Payer: Medicare Other | Attending: Internal Medicine | Admitting: Internal Medicine

## 2021-11-17 ENCOUNTER — Encounter (HOSPITAL_COMMUNITY): Payer: Self-pay | Admitting: Internal Medicine

## 2021-11-17 DIAGNOSIS — Z823 Family history of stroke: Secondary | ICD-10-CM | POA: Diagnosis not present

## 2021-11-17 DIAGNOSIS — E876 Hypokalemia: Secondary | ICD-10-CM | POA: Diagnosis present

## 2021-11-17 DIAGNOSIS — F5104 Psychophysiologic insomnia: Secondary | ICD-10-CM | POA: Diagnosis present

## 2021-11-17 DIAGNOSIS — I4821 Permanent atrial fibrillation: Secondary | ICD-10-CM

## 2021-11-17 DIAGNOSIS — T501X5A Adverse effect of loop [high-ceiling] diuretics, initial encounter: Secondary | ICD-10-CM | POA: Diagnosis present

## 2021-11-17 DIAGNOSIS — R55 Syncope and collapse: Secondary | ICD-10-CM

## 2021-11-17 DIAGNOSIS — N400 Enlarged prostate without lower urinary tract symptoms: Secondary | ICD-10-CM | POA: Diagnosis present

## 2021-11-17 DIAGNOSIS — I5032 Chronic diastolic (congestive) heart failure: Secondary | ICD-10-CM | POA: Diagnosis present

## 2021-11-17 DIAGNOSIS — Z7901 Long term (current) use of anticoagulants: Secondary | ICD-10-CM | POA: Diagnosis not present

## 2021-11-17 DIAGNOSIS — G4733 Obstructive sleep apnea (adult) (pediatric): Secondary | ICD-10-CM | POA: Diagnosis present

## 2021-11-17 DIAGNOSIS — U071 COVID-19: Secondary | ICD-10-CM | POA: Diagnosis present

## 2021-11-17 DIAGNOSIS — E861 Hypovolemia: Secondary | ICD-10-CM | POA: Diagnosis present

## 2021-11-17 DIAGNOSIS — E785 Hyperlipidemia, unspecified: Secondary | ICD-10-CM | POA: Diagnosis present

## 2021-11-17 DIAGNOSIS — I110 Hypertensive heart disease with heart failure: Secondary | ICD-10-CM | POA: Diagnosis present

## 2021-11-17 DIAGNOSIS — L719 Rosacea, unspecified: Secondary | ICD-10-CM | POA: Diagnosis present

## 2021-11-17 DIAGNOSIS — J9611 Chronic respiratory failure with hypoxia: Secondary | ICD-10-CM | POA: Diagnosis present

## 2021-11-17 DIAGNOSIS — E1140 Type 2 diabetes mellitus with diabetic neuropathy, unspecified: Secondary | ICD-10-CM | POA: Diagnosis present

## 2021-11-17 DIAGNOSIS — I952 Hypotension due to drugs: Secondary | ICD-10-CM | POA: Diagnosis present

## 2021-11-17 DIAGNOSIS — L219 Seborrheic dermatitis, unspecified: Secondary | ICD-10-CM | POA: Diagnosis present

## 2021-11-17 DIAGNOSIS — J439 Emphysema, unspecified: Secondary | ICD-10-CM | POA: Diagnosis present

## 2021-11-17 DIAGNOSIS — J61 Pneumoconiosis due to asbestos and other mineral fibers: Secondary | ICD-10-CM | POA: Diagnosis present

## 2021-11-17 DIAGNOSIS — G894 Chronic pain syndrome: Secondary | ICD-10-CM | POA: Diagnosis present

## 2021-11-17 DIAGNOSIS — Z66 Do not resuscitate: Secondary | ICD-10-CM | POA: Diagnosis present

## 2021-11-17 DIAGNOSIS — J849 Interstitial pulmonary disease, unspecified: Secondary | ICD-10-CM | POA: Diagnosis present

## 2021-11-17 LAB — CBC
HCT: 43 % (ref 39.0–52.0)
Hemoglobin: 14.3 g/dL (ref 13.0–17.0)
MCH: 34 pg (ref 26.0–34.0)
MCHC: 33.3 g/dL (ref 30.0–36.0)
MCV: 102.1 fL — ABNORMAL HIGH (ref 80.0–100.0)
Platelets: 225 10*3/uL (ref 150–400)
RBC: 4.21 MIL/uL — ABNORMAL LOW (ref 4.22–5.81)
RDW: 12.5 % (ref 11.5–15.5)
WBC: 8.4 10*3/uL (ref 4.0–10.5)
nRBC: 0 % (ref 0.0–0.2)

## 2021-11-17 LAB — ECHOCARDIOGRAM LIMITED
AR max vel: 2.67 cm2
AV Area VTI: 2.72 cm2
AV Area mean vel: 2.62 cm2
AV Mean grad: 13 mmHg
AV Peak grad: 21.3 mmHg
Ao pk vel: 2.31 m/s
Height: 71 in
Weight: 4296 oz

## 2021-11-17 LAB — BASIC METABOLIC PANEL
Anion gap: 9 (ref 5–15)
BUN: 23 mg/dL (ref 8–23)
CO2: 33 mmol/L — ABNORMAL HIGH (ref 22–32)
Calcium: 8.5 mg/dL — ABNORMAL LOW (ref 8.9–10.3)
Chloride: 96 mmol/L — ABNORMAL LOW (ref 98–111)
Creatinine, Ser: 0.96 mg/dL (ref 0.61–1.24)
GFR, Estimated: >60 mL/min (ref >60)
Glucose, Bld: 133 mg/dL — ABNORMAL HIGH (ref 70–99)
Potassium: 4.4 mmol/L (ref 3.5–5.1)
Sodium: 138 mmol/L (ref 135–145)

## 2021-11-17 LAB — C-REACTIVE PROTEIN: CRP: <0.5 mg/dL (ref <1.0)

## 2021-11-17 LAB — MAGNESIUM: Magnesium: 2 mg/dL (ref 1.7–2.4)

## 2021-11-17 LAB — CBG MONITORING, ED: Glucose-Capillary: 127 mg/dL — ABNORMAL HIGH (ref 70–99)

## 2021-11-17 LAB — PHOSPHORUS: Phosphorus: 3.3 mg/dL (ref 2.5–4.6)

## 2021-11-17 LAB — D-DIMER, QUANTITATIVE: D-Dimer, Quant: 0.83 ug/mL-FEU — ABNORMAL HIGH (ref 0.00–0.50)

## 2021-11-17 LAB — FERRITIN: Ferritin: 961 ng/mL — ABNORMAL HIGH (ref 24–336)

## 2021-11-17 MED ORDER — ORAL CARE MOUTH RINSE
15.0000 mL | Freq: Two times a day (BID) | OROMUCOSAL | Status: DC
  Administered 2021-11-17 – 2021-11-18 (×3): 15 mL via OROMUCOSAL

## 2021-11-17 NOTE — Plan of Care (Signed)
New Admission, 1512. Syncope.  +COVID 19, recent discharge.  Aox4, pleasant and cooperative with staff. Very Hard of Hearing, Left Eye Blindness.  Admission complete, infection/fall precautions initiated.  Telemetry monitoring, 3LNC currently. VSS.   Problem: Education: Goal: Knowledge of General Education information will improve Description: Including pain rating scale, medication(s)/side effects and non-pharmacologic comfort measures Outcome: Progressing   Problem: Health Behavior/Discharge Planning: Goal: Ability to manage health-related needs will improve Outcome: Progressing   Problem: Clinical Measurements: Goal: Ability to maintain clinical measurements within normal limits will improve Outcome: Progressing Goal: Will remain free from infection Outcome: Progressing Goal: Diagnostic test results will improve Outcome: Progressing Goal: Respiratory complications will improve Outcome: Progressing Goal: Cardiovascular complication will be avoided Outcome: Progressing   Problem: Activity: Goal: Risk for activity intolerance will decrease Outcome: Progressing   Problem: Nutrition: Goal: Adequate nutrition will be maintained Outcome: Progressing   Problem: Coping: Goal: Level of anxiety will decrease Outcome: Progressing   Problem: Elimination: Goal: Will not experience complications related to bowel motility Outcome: Progressing Goal: Will not experience complications related to urinary retention Outcome: Progressing   Problem: Pain Managment: Goal: General experience of comfort will improve Outcome: Progressing   Problem: Safety: Goal: Ability to remain free from injury will improve Outcome: Progressing   Problem: Skin Integrity: Goal: Risk for impaired skin integrity will decrease Outcome: Progressing   Problem: Education: Goal: Knowledge of risk factors and measures for prevention of condition will improve Outcome: Progressing   Problem: Coping: Goal:  Psychosocial and spiritual needs will be supported Outcome: Progressing   Problem: Respiratory: Goal: Will maintain a patent airway Outcome: Progressing Goal: Complications related to the disease process, condition or treatment will be avoided or minimized Outcome: Progressing

## 2021-11-17 NOTE — Progress Notes (Signed)
Patient has home CPAP. Wife stated she will place him on it and switch the oxygen tubing when ready to go on it for the night. Informed patient and wife if needed any further assistance to call.

## 2021-11-17 NOTE — ED Notes (Signed)
Pt wants to talk to MD re: medications such as Remdesivir and fluid pill. Dr. Margo Aye was notified and states she will call wife.

## 2021-11-17 NOTE — Procedures (Signed)
Patient Name: Maurice Reed  MRN: [id number]  Epilepsy Attending: Charlsie Quest  Referring Physician/Provider: Dr Dow Adolph Date: 11/17/2021 Duration: 22.42 mins  Patient history: 80 year old male with syncope, loss of consciousness for 3 to 4 minutes associated with tremors of lower extremities and foaming at as well as incontinent of urine and stools.  EEG to evaluate for procedure.  Level of alertness: Awake, drowsy  AEDs during EEG study: None  Technical aspects: This EEG study was done with scalp electrodes positioned according to the 10-20 International system of electrode placement. Electrical activity was acquired at a sampling rate of 500Hz  and reviewed with a high frequency filter of 70Hz  and a low frequency filter of 1Hz . EEG data were recorded continuously and digitally stored.   Description: The posterior dominant rhythm consists of 9 Hz activity of moderate voltage (25-35 uV) seen predominantly in posterior head regions, symmetric and reactive to eye opening and eye closing. Drowsiness was characterized by attenuation of the posterior background rhythm. Hyperventilation and photic stimulation were not performed.     IMPRESSION: This study is within normal limits. No seizures or epileptiform discharges were seen throughout the recording.  Elnoria Livingston 

## 2021-11-17 NOTE — Progress Notes (Signed)
Echocardiogram 2D Echocardiogram has been performed.  Warren Lacy Mignonne Afonso RDCS 11/17/2021, 12:28 PM

## 2021-11-17 NOTE — Consult Note (Addendum)
Cardiology Consultation:   Patient ID: KEIDEN DESKIN MRN: [id number]; DOB: Jul 31, 1941  Admit date: 11/16/2021 Date of Consult: 11/17/2021  PCP:  Inda Coke, [place] [facility] Cardiologist:  Quay Burow, MD      [facility] - [facility]  Patient Profile:   JODI KAPPES is a 80 y.o. male with a hx of Perm Afib on Eliquis, PE, OSA, OA, GERD, COPD on home O2, asbestos lung disease, obesity, nephrolithiasis, who is being seen 11/17/2021 for the evaluation of syncope at the request of Dr Nevada Crane.  History of Present Illness:   Mr. Klees was seen in the Afib Clinic 06/2021, continue Eliquis and Toprol XL 25 mg qd, added Dilt 30 mg prn for HR >110.  Admitted 12/09-12/13/2022 for COVID PNA. D/c on 4 lpm O2. He was weak, but doing ok.   Mr Castagna came to the hospital last pm for syncope >> Cards to see.  While he was in the hospital, Mr. Fagin had been going back and forth to the bathroom with walker.  He had not otherwise gotten out of bed or moved around the room.  His wife drove him home.  After he got home, he got out of the car and was using a walker to get to the back door.  There is a step that he has to go up to get in the house.  When he did that, he felt himself getting lightheaded and weak.  There was no place right there to sit down.  He could feel his legs getting weak which they are chronically weak because of the neuropathy, but he could tell he was going to fall.  He fell and lost consciousness for a brief period of time.  No seizure activity.  No palpitations.  When he awoke, he knew who and where he was.  He has a portable oxygen machine that is intermittent.  For some time now, he has been needing to turn it up when he is doing anything, in order to get enough air.  He has periods where he will get a little bit lightheaded.  These are not clearly orthostatic in nature.  He will take a few deep breaths, and symptoms will improve.  He has not had any falls  from these episodes.  They do not occur every day, but he is sure he has them at least once a week.  He came back to the hospital via EMS.  He was given potassium supplementation for potassium initially 2.8.  He is on IV fluids at 75 cc an hour.  On EMS arrival, his oxygen saturation was in the 80s, even on 3 L of oxygen.  He also had some chest tightness.  When his oxygenation was improved, the chest tightness resolved.  They tried to do orthostatics, but he got dizzy when they tried to stand him up in the standing vitals were not done.  Of note, his heart rate went from 90-142, lying -> sitting  Orthostatic VS for the past 24 hrs (Last 3 readings):  BP- Lying Pulse- Lying BP- Sitting Pulse- Sitting  11/16/21 2014 (!) 130/94 90 127/78 142      Past Medical History:  Diagnosis Date   Arthritis    Atrial fibrillation ([place])    Blind left eye 2005   was a result of cataract surgery   Diverticula, bladder    Erectile dysfunction    GERD (gastroesophageal reflux disease)    Kidney stones  Obesity    Pulmonary embolism [facility])     Past Surgical History:  Procedure Laterality Date   [place]   NOSE SURGERY  2003   TONSILLECTOMY AND ADENOIDECTOMY  9735   UMBILICAL HERNIA REPAIR  1984     Home Medications:  Prior to Admission medications   Medication Sig Start Date End Date Taking? Authorizing Provider  apixaban (ELIQUIS) 5 MG TABS tablet Take 1 tablet (5 mg total) by mouth 2 (two) times daily. 09/27/21  Yes Worley, Aldona Bar, PA  Apremilast (OTEZLA) 30 MG TABS Take 30 mg by mouth 2 (two) times daily.   Yes [provider]  metoprolol succinate (TOPROL-XL) 25 MG 24 hr tablet TAKE 1 TABLET BY MOUTH  DAILY Patient taking differently: 25 mg daily. 03/17/21  Yes Inda Coke, PA  torsemide 40 MG TABS Take 40 mg by mouth 2 (two) times daily. 11/16/21  Yes Shawna Clamp, MD  Ascorbic Acid (VITAMIN C) 500 MG CAPS Take 500 mg by mouth daily.     [provider]  Atropine Sulfate-NaCl 0.01-0.9 % SOLN Place 1 drop into the left eye at bedtime.    [provider]  bimatoprost (LUMIGAN) 0.01 % SOLN Place 1 drop into the right eye at bedtime.    [provider]  Cholecalciferol (VITAMIN D3) 3000 units TABS Take 3,000 Units by mouth daily.    [provider]  clobetasol (TEMOVATE) 0.05 % external solution APPLY TO AFFECTED AREA(S)  TOPICALLY ON SCALP 1 TO 2  TIMES DAILY AS NEEDED Patient taking differently: 1 application 2 (two) times daily as needed (for scalp). 09/06/21   Inda Coke, PA  desonide (DESOWEN) 0.05 % lotion Apply 1 application topically daily as needed (itching). 07/20/12   [provider]  dexamethasone (DECADRON) 6 MG tablet Take 1 tablet (6 mg total) by mouth daily for 6 days. 11/16/21 11/22/21  Shawna Clamp, MD  docusate sodium (COLACE) 100 MG capsule Take 100 mg by mouth 2 (two) times daily.    [provider]  doxycycline (VIBRA-TABS) 100 MG tablet TAKE 1 TABLET BY MOUTH  TWICE DAILY Patient taking differently: Take 100 mg by mouth 2 (two) times daily. 06/21/21   Vivi Barrack, MD  erythromycin ophthalmic ointment Place 1 application into both eyes at bedtime. 05/01/18   [provider]  ipratropium-albuterol (DUONEB) 0.5-2.5 (3) MG/3ML SOLN Take 3 mLs by nebulization every 6 (six) hours as needed. Patient taking differently: Take 3 mLs by nebulization every 6 (six) hours as needed (wheezing). 11/12/21   Inda Coke, PA  ketorolac (ACULAR) 0.5 % ophthalmic solution Place 1 drop into both eyes 4 (four) times daily as needed (irritation). 03/18/21   [provider]  magnesium oxide (MAG-OX) 400 MG tablet TAKE 1 TABLET BY MOUTH TWICE A DAY Patient taking differently: Take 400 mg by mouth 2 (two) times daily. 08/30/21   Inda Coke, PA  mometasone (NASONEX) 50 MCG/ACT nasal spray Place 2 sprays into the nose daily. Patient taking differently:  Place 2 sprays into the nose daily as needed (congestion). 03/05/19   Rigoberto Noel, MD  NARCAN 4 MG/0.1ML LIQD nasal spray kit 1 spray once as needed (overdose). 08/22/18   [provider]  nystatin ointment (MYCOSTATIN) Apply 1 application topically 2 (two) times daily. Patient taking differently: Apply 1 application topically daily as needed (skinn irritation). 09/22/20   Inda Coke, PA  oxyCODONE (OXY IR/ROXICODONE) 5 MG immediate release tablet Take 5 mg  by mouth 3 (three) times daily as needed for severe pain. for pain 04/23/18   [provider]  prednisoLONE acetate (PRED FORTE) 1 % ophthalmic suspension Place 1 drop into the left eye at bedtime. 04/25/19   [provider]  pregabalin (LYRICA) 50 MG capsule Take 50 mg by mouth 2 (two) times daily.    Jeanella Anton, NP  spironolactone (ALDACTONE) 25 MG tablet Take 25 mg by mouth daily. 09/27/21   [provider]  tadalafil (CIALIS) 20 MG tablet Take 0.5-1 tablets (10-20 mg total) by mouth every other day as needed for erectile dysfunction. 03/01/21   Inda Coke, PA  tamsulosin (FLOMAX) 0.4 MG CAPS capsule TAKE 1 CAPSULE BY MOUTH AT  BEDTIME Patient taking differently: 0.4 mg at bedtime. 04/26/21   Inda Coke, PA  temazepam (RESTORIL) 15 MG capsule TAKE 1 CAPSULE (15 MG TOTAL) BY MOUTH AT BEDTIME AS NEEDED FOR SLEEP. Patient taking differently: Take 15 mg by mouth at bedtime. TAKE 1 CAPSULE (15 MG TOTAL) BY MOUTH AT BEDTIME AS NEEDED FOR SLEEP. 06/02/21   Vivi Barrack, MD  TRIAMCINOLONE PO Apply 1 application topically daily as needed (itching).    [provider]  zinc gluconate 50 MG tablet Take 50 mg by mouth daily.    [provider]    Inpatient Medications: Scheduled Meds:  apixaban  5 mg Oral BID   Apremilast  30 mg Oral BID   Atropine Sulfate-NaCl  1 drop Left Eye QHS   dexamethasone  6 mg Oral Daily   doxycycline  100 mg Oral BID   erythromycin  1  application Both Eyes QHS   latanoprost  1 drop Right Eye QHS   magnesium oxide  400 mg Oral BID   metoprolol succinate  25 mg Oral Daily   potassium chloride  20 mEq Oral Daily   prednisoLONE acetate  1 drop Left Eye QHS   pregabalin  50 mg Oral BID   sodium chloride flush  3 mL Intravenous Q12H   tamsulosin  0.4 mg Oral QHS   temazepam  15 mg Oral QHS   Continuous Infusions:  0.9 % NaCl with KCl 40 mEq / L 75 mL/hr at 11/16/21 2312   PRN Meds: acetaminophen **OR** acetaminophen, albuterol, clobetasol, ipratropium-albuterol, ketorolac, ondansetron **OR** ondansetron (ZOFRAN) IV, oxyCODONE  Allergies:    Allergies  Allergen Reactions   Other Itching    Cats: watery eyes, itching, sneezing    Social History:   Social History   Socioeconomic History   Marital status: Married    Spouse name: Not on file   Number of children: Not on file   Years of education: Not on file   Highest education level: Not on file  Occupational History   Not on file  Tobacco Use   Smoking status: Former    Packs/day: 3.00    Years: 25.00    Pack years: 75.00    Types: Cigarettes    Quit date: 11/28/1984    Years since quitting: 36.9   Smokeless tobacco: Never  Vaping Use   Vaping Use: Never used  Substance and Sexual Activity   Alcohol use: Yes    Alcohol/week: 1.0 standard drink    Types: 1 Glasses of wine per week    Comment: at times   Drug use: Not Currently    Types: Marijuana    Comment: Gummies    Sexual activity: Yes  Other Topics Concern   Not on file  Social History  Narrative   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44)   Married to [employer] ([name] [place] patient)   7 children   Currently lives with son in [place], [place]; all their things are in storage; plan to travel and stay with kids but keep their home base in the [place] area   Right Handed   Social Determinants of Health   Financial Resource Strain: Low Risk    Difficulty of  Paying Living Expenses: Not very hard  Food Insecurity: No Food Insecurity   Worried About Charity fundraiser in the Last Year: Never true   [place] in the Last Year: Never true  Transportation Needs: No Transportation Needs   Lack of Transportation (Medical): No   Lack of Transportation (Non-Medical): No  Physical Activity: Insufficiently Active   Days of Exercise per Week: 2 days   Minutes of Exercise per Session: 10 min  Stress: No Stress Concern Present   Feeling of Stress : Not at all  Social Connections: Moderately Isolated   Frequency of Communication with Friends and Family: More than three times a week   Frequency of Social Gatherings with Friends and Family: Once a week   Attends Religious Services: Never   Marine scientist or Organizations: No   Attends Music therapist: Never   Marital Status: Married  Human resources officer Violence: Not At Risk   Fear of Current or Ex-Partner: No   Emotionally Abused: No   Physically Abused: No   Sexually Abused: No    Family History:   Family History  Problem Relation Age of Onset   Arthritis Mother    Hearing loss Mother    Hypertension Mother    Heart disease Mother    Stroke Mother    Arthritis Father    Hearing loss Father    Heart disease Father    Hypertension Father    Heart attack Father    Kidney disease Father    Stroke Father      ROS:  Please see the history of present illness.  All other ROS reviewed and negative.     Physical Exam/Data:   Vitals:   11/17/21 0045 11/17/21 0145 11/17/21 0445 11/17/21 0730  BP: 122/75 126/90 133/84 136/82  Pulse: 77 85 81 84  Resp: (!) 22 (!) '21 15 20  ' Temp:      TempSrc:      SpO2: 94% 96% 98% 100%  Weight:      Height:       No intake or output data in the 24 hours ending 11/17/21 0800 Last 3 Weights 11/16/2021 11/13/2021 11/12/2021  Weight (lbs) 268 lb 8 oz 268 lb 5 oz 270 lb  Weight (kg) 121.791 kg 121.706 kg 122.471 kg     Body mass  index is 37.45 kg/m.  General:  Well nourished, well developed, in no acute distress HEENT: normal Neck: no JVD seen, difficult to assess secondary to body habitus Vascular: No carotid bruits; Distal pulses 2+ bilaterally Cardiac:  normal S1, S2; irregular rate and rhythm; no murmur  Lungs: Basilar Rales bilaterally, no wheezing, rhonchi  Abd: soft, nontender, no hepatomegaly  Ext: no edema Musculoskeletal:  No deformities, BUE and BLE strength normal and equal Skin: warm and dry  Neuro:  CNs 2-12 intact, no focal abnormalities noted Psych:  Normal affect   EKG:  The EKG was personally reviewed and demonstrates:  Atrial fib, HR 113, +artifact  Telemetry:  Telemetry was personally reviewed and demonstrates:  atrial fib, rate controlled most of the time  Relevant CV Studies:  ECHO: ordered  ECHO: 04/27/2020  1. Left ventricular ejection fraction, by estimation, is 60 to 65%. The  left ventricle has normal function. The left ventricle has no regional  wall motion abnormalities. There is moderate concentric left ventricular hypertrophy. Left ventricular diastolic parameters are indeterminate.   2. Right ventricular systolic function is normal. The right ventricular  size is normal. There is moderately elevated pulmonary artery systolic pressure.   3. Left atrial size was mildly dilated.   4. The mitral valve is normal in structure. Trivial mitral valve  regurgitation. No evidence of mitral stenosis.   5. The aortic valve is tricuspid. Aortic valve regurgitation is not  visualized. Mild aortic valve stenosis. Aortic valve area, by VTI measures 1.26 cm. Aortic valve mean gradient measures 8.7 mmHg. Aortic valve Vmax measures 1.85 m/s.   6. The inferior vena cava is normal in size with greater than 50%  respiratory variability, suggesting right atrial pressure of 3 mmHg.   Laboratory Data:  High Sensitivity Troponin:   Recent Labs  Lab 11/12/21 1931 11/12/21 2223 11/16/21 1623  11/16/21 1824  TROPONINIHS '7 7 7 7     ' Chemistry Recent Labs  Lab 11/16/21 0402 11/16/21 1623 11/16/21 1645 11/17/21 0440  NA 137 141 140 138  K 3.7 2.8* 3.0* 4.4  CL 92* 96* 93* 96*  CO2 33* 34*  --  33*  GLUCOSE 151* 95 98 133*  BUN 25* 24* 24* 23  CREATININE 0.93 0.91 1.00 0.96  CALCIUM 8.9 8.0*  --  8.5*  MG 1.5* 1.8  --  2.0  GFRNONAA >60 >60  --  >60  ANIONGAP 12 11  --  9    Recent Labs  Lab 11/15/21 0347 11/16/21 0402 11/16/21 1623  PROT 6.8 6.9 6.7  ALBUMIN 3.4* 3.4* 3.2*  AST '20 23 29  ' ALT '14 15 19  ' ALKPHOS 80 83 81  BILITOT 0.6 0.5 0.5   Lipids  Recent Labs  Lab 11/12/21 2226  TRIG 116    Hematology Recent Labs  Lab 11/16/21 0402 11/16/21 1623 11/16/21 1645 11/17/21 0440  WBC 4.3 9.2  --  8.4  RBC 4.24 4.34  --  4.21*  HGB 14.3 14.7 15.0 14.3  HCT 42.5 44.4 44.0 43.0  MCV 100.2* 102.3*  --  102.1*  MCH 33.7 33.9  --  34.0  MCHC 33.6 33.1  --  33.3  RDW 12.3 12.4  --  12.5  PLT 211 229  --  225   Thyroid No results for input(s): TSH, FREET4 in the last 168 hours.  BNP Recent Labs  Lab 11/12/21 1931  BNP 120.9*    DDimer  Recent Labs  Lab 11/15/21 0347 11/16/21 0402 11/17/21 0440  DDIMER 0.79* 0.67* 0.83*     Radiology/Studies:  CT Angio Chest PE W and/or Wo Contrast  Result Date: 11/16/2021 CLINICAL DATA:  COVID positive.  Pulmonary embolism suspected. EXAM: CT ANGIOGRAPHY CHEST WITH CONTRAST TECHNIQUE: Multidetector CT imaging of the chest was performed using the standard protocol during bolus administration of intravenous contrast. Multiplanar CT image reconstructions and MIPs were obtained to evaluate the vascular anatomy. CONTRAST:  146m OMNIPAQUE IOHEXOL 350 MG/ML SOLN COMPARISON:  12/17/2019 FINDINGS: Cardiovascular: Heart is enlarged. No substantial pericardial effusion. Coronary artery calcification is evident. Moderate atherosclerotic calcification is noted in the wall of the thoracic aorta. There is no filling  defect  within the opacified pulmonary arteries to suggest the presence of an acute pulmonary embolus. Mediastinum/Nodes: No mediastinal lymphadenopathy. There is no hilar lymphadenopathy. The esophagus has normal imaging features. There is no axillary lymphadenopathy. Lungs/Pleura: Mosaic ground-glass attenuation in both lungs is nonspecific but likely reflects air trapping. Calcified pleural plaques bilaterally are consistent with prior asbestos exposure. Architectural distortion and scarring noted in the peripheral lungs bilaterally. Upper Abdomen: Unremarkable. Musculoskeletal: No worrisome lytic or sclerotic osseous abnormality. Review of the MIP images confirms the above findings. IMPRESSION: 1. No CT evidence for acute pulmonary embolus. 2. Mosaic ground-glass attenuation in both lungs is nonspecific but likely reflects air trapping. 3. Calcified pleural plaques bilaterally consistent with prior asbestos exposure. Peripheral architectural distortion/scarring is compatible with asbestos related lung disease. 4. Aortic Atherosclerosis (ICD10-I70.0). Electronically Signed   By: Misty Stanley M.D.   On: 11/16/2021 17:09   DG Chest Port 1 View  Result Date: 11/16/2021 CLINICAL DATA:  Shortness of breath EXAM: PORTABLE CHEST 1 VIEW COMPARISON:  11/12/2021 FINDINGS: Cardiomegaly, vascular congestion. Bilateral interstitial and airspace opacities, which could reflect edema or infection superimposed on chronic lung disease. Extensive calcified pleural plaques on the left and right lung base. Fall no visible effusions. No acute bony abnormality. IMPRESSION: Cardiomegaly with vascular congestion. Bilateral interstitial/alveolar opacities could reflect edema or infection. Electronically Signed   By: Rolm Baptise M.D.   On: 11/16/2021 17:09     Assessment and Plan:   Syncope: -Orthostatics are positive, his blood pressure did not change much but his heart rate went up 50 points and they were unable to get standing  vital signs due to dizziness - Additionally, he was hypoxic on EMS arrival even on his home oxygen dose of 3 L/min. - He has no history of bradycardia - At this time, suspect a noncardiac cause of the syncope - Keep him on telemetry while he is here, will likely plan monitor on discharge --We will follow-up echocardiogram -Feel that he should be evaluated by physical therapy  2.  COVID - He completed remdesivir during his previous stay - Management per IM  3.  Permanent atrial fibrillation - His rate is generally well controlled, with heart rate elevations related to activity - Continue home medications, including Eliquis   Risk Assessment/Risk Scores:        CHA2DS2-VASc Score = 4   This indicates a 4.8% annual risk of stroke. The patient's score is based upon: CHF History: 0 HTN History: 1 Diabetes History: 0 Stroke History: 0 Vascular Disease History: 1 (DVT/PE) Age Score: 2 Gender Score: 0    For questions or updates, please contact [facility] Please consult [email or web address] for contact info under    Signed, Rosaria Ferries, PA-C  11/17/2021 8:00 AM   Patient seen and examined.  Agree with above documentation.  Mr. 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 Nevada Crane for evaluation of syncope.  He was admitted from 12/9 through 11/16/2021 with COVID-19 pneumonia.  He received treatment with remdesivir and steroids.  He was discharged on 12/13 with plan to complete course of steroids at home.  He was on his home 3 L [place] on discharge.  He was walking up his stairs at home when he felt lightheaded and thought he was going to pass out.  His wife caught him and guided him to the ground.  She reports he was unresponsive for several minutes and had shaking of his legs.  He denies any palpitations, dyspnea, or chest pain  preceding the episode. Does not report confusion.  EMS was called and on presentation to the ED initial vital signs showed BP 141/94, pulse 108, SPO2 94% on 3 L.  Labs notable for potassium 2.8, creatinine 0.9, bicarb 34, troponin 7 > 7, D-dimer 0.83.  CTPA showed no PE, bilateral air trapping, calcified pleural plaques consistent with asbestos exposure.  EKG shows Afib, rate 113. Telemetry shows Afib with rate 80-90s, PVCs. On exam, patient is alert and oriented, normal rate, irregular, no murmurs, lungs clear in anterior fields, no LE edema.  Suspect orthostatic hypotension as cause of his syncope.  Unable to check standing vital signs due to dizziness.  Will check echocardiogram to rule out structural heart disease.  Continue to monitor on telemetry, will likely plan monitor on discharge.  Donato Heinz, MD

## 2021-11-17 NOTE — Progress Notes (Signed)
EEG complete - results pending 

## 2021-11-17 NOTE — ED Notes (Signed)
Switched pt from CPAP to 3L [place] per pt's request. He states he wears 3LNC at home all day.

## 2021-11-17 NOTE — Progress Notes (Signed)
PROGRESS NOTE  Maurice Reed WHQ:[id number] DOB: December 07, 1940 DOA: 11/16/2021 PCP: Jarold Motto, PA  HPI/Recap of past 24 hours: Maurice Reed is a 80 y.o. male with medical history significant for permanent atrial fibrillation on Eliquis, emphysema/ILD, chronic respiratory failure with hypoxia on 3 L O2 at rest and 4-5 L with exertion, HFpEF (EF 55% by TTE 09/30/2021), history of PE, HTN, chronic pain syndrome, and OSA on BiPAP who presented to the ED for evaluation of syncope witnessed by his wife.  Just released from the hospital on the day of admission after hospitalization for COVID-19 viral infection.  His wife reports that he lost consciousness for 3-4 minutes.  Associated with tremors in his lower extremities, foaming at his mouth, incontinence of urine and stools.  Did not hit his head.  He was brought into the ED for further evaluation.  2D echo obtained and pending.  Orthostatic vital signs pending.  On telemetry monitoring.  Seen by cardiology.  11/17/2021: Patient was seen and examined at his bedside.  Very hard of hearing.  States he felt chest pressure after regaining consciousness.  No anginal symptoms at the time of the visit.   Assessment/Plan: Principal Problem:   Syncope Active Problems:   OSA treated with BiPAP   Essential hypertension   Atrial fibrillation (HCC)   ILD (interstitial lung disease) (HCC)   Chronic respiratory failure with hypoxia (HCC)   Hypokalemia   (HFpEF) heart failure with preserved ejection fraction (HCC)  Syncope, unclear etiology, rule out cardiogenic causes versus seizure His wife reports that he lost consciousness for 3-4 minutes.  Associated with tremors in his lower extremities, foaming at his mouth, incontinence of urine and stools.   2D echo obtained, results are pending. Obtain orthostatic vital signs Continue telemetry monitoring Will benefit from ambulatory cardiac monitoring EEG ordered, follow results. Fall precautions PT OT  assessment  Hypokalemia in the setting of diuretics Patient is on torsemide but also on spironolactone which is potassium sparing. He presented with potassium 2.8 This was repleted orally Mildly volume depleted Home torsemide was held Start daily potassium supplement along with torsemide after this resumed.  Resume home spironolactone when appropriate. Repeat BMP in the morning.  COVID-19 viral infection Prior to this admission patient has completed 4 doses of remdesivir His wife requested fifth dose Remdesivir 100 mg x 1 dose. Continue p.o. Decadron Continue bronchodilators Maintain O2 saturation greater than 90%.  Permanent atrial fibrillation Rate is controlled, continue home Toprol-XL 25 mg daily Continue Eliquis for primary CVA prevention  Neuropathic pain Patient on Lyrica PT OT assessment  BPH Patient on Flomax  Chronic insomnia Patient on temazepam  Morbid obesity BMS of 37 Recommend weight loss outpatient with healthy dieting and regular physical activity  OSA Resume home regimen On CPAP    Code Status: DNR  Family Communication: Updated his wife via phone  Disposition Plan: Likely will discharge to home with home health services.   Consultants: Cardiology  Procedures: 2D echo 11/17/2021  Antimicrobials: Antiviral IV remdesivir  DVT prophylaxis: Eliquis  Status is: Observation  Patient will require at least 2 midnights for further evaluation and treatment of present condition.      Objective: Vitals:   11/17/21 0900 11/17/21 1000 11/17/21 1100 11/17/21 1200  BP: 139/88 137/85 122/80 137/88  Pulse: 93 98 75 91  Resp: (!) 23 20 20  (!) 22  Temp:      TempSrc:      SpO2: 97% 93% 100% 100%  Weight:  Height:        Intake/Output Summary (Last 24 hours) at 11/17/2021 1225 Last data filed at 11/17/2021 1147 Gross per 24 hour  Intake 825.19 ml  Output --  Net 825.19 ml   Filed Weights   11/16/21 1605  Weight: 121.8 kg     Exam:  General: 80 y.o. year-old male well developed well nourished in no acute distress.  Alert and oriented x3. Cardiovascular: Irregular rate and rhythm with no rubs or gallops.  No thyromegaly or JVD noted.   Respiratory: Clear to auscultation with no wheezes or rales. Good inspiratory effort. Abdomen: Soft nontender nondistended with normal bowel sounds x4 quadrants. Musculoskeletal: No lower extremity edema. 2/4 pulses in all 4 extremities. Skin: No ulcerative lesions noted or rashes, Psychiatry: Mood is appropriate for condition and setting   Data Reviewed: CBC: Recent Labs  Lab 11/13/21 0736 11/14/21 0405 11/15/21 0347 11/16/21 0402 11/16/21 1623 11/16/21 1645 11/17/21 0440  WBC 4.9 3.0* 3.7* 4.3 9.2  --  8.4  NEUTROABS 2.7 2.0 2.3 3.0 6.9  --   --   HGB 11.8* 13.8 13.1 14.3 14.7 15.0 14.3  HCT 36.2* 40.9 39.0 42.5 44.4 44.0 43.0  MCV 105.8* 102.5* 101.6* 100.2* 102.3*  --  102.1*  PLT 170 133* 211 211 229  --  123456   Basic Metabolic Panel: Recent Labs  Lab 11/14/21 0405 11/15/21 0347 11/16/21 0402 11/16/21 1623 11/16/21 1645 11/17/21 0440  NA 139 139 137 141 140 138  K 3.8 3.3* 3.7 2.8* 3.0* 4.4  CL 96* 96* 92* 96* 93* 96*  CO2 33* 35* 33* 34*  --  33*  GLUCOSE 129* 139* 151* 95 98 133*  BUN 16 20 25* 24* 24* 23  CREATININE 0.88 0.94 0.93 0.91 1.00 0.96  CALCIUM 8.6* 8.8* 8.9 8.0*  --  8.5*  MG  --   --  1.5* 1.8  --  2.0  PHOS  --   --  3.8  --   --  3.3   GFR: Estimated Creatinine Clearance: 81.5 mL/min (by C-G formula based on SCr of 0.96 mg/dL). Liver Function Tests: Recent Labs  Lab 11/13/21 0736 11/14/21 0405 11/15/21 0347 11/16/21 0402 11/16/21 1623  AST 18 22 20 23 29   ALT 11 15 14 15 19   ALKPHOS 82 89 80 83 81  BILITOT 0.3 0.5 0.6 0.5 0.5  PROT 6.4* 6.9 6.8 6.9 6.7  ALBUMIN 3.4* 3.4* 3.4* 3.4* 3.2*   No results for input(s): LIPASE, AMYLASE in the last 168 hours. No results for input(s): AMMONIA in the last 168  hours. Coagulation Profile: No results for input(s): INR, PROTIME in the last 168 hours. Cardiac Enzymes: No results for input(s): CKTOTAL, CKMB, CKMBINDEX, TROPONINI in the last 168 hours. BNP (last 3 results) No results for input(s): PROBNP in the last 8760 hours. HbA1C: No results for input(s): HGBA1C in the last 72 hours. CBG: Recent Labs  Lab 11/17/21 0549  GLUCAP 127*   Lipid Profile: No results for input(s): CHOL, HDL, LDLCALC, TRIG, CHOLHDL, LDLDIRECT in the last 72 hours. Thyroid Function Tests: No results for input(s): TSH, T4TOTAL, FREET4, T3FREE, THYROIDAB in the last 72 hours. Anemia Panel: Recent Labs    11/16/21 0402 11/17/21 0440  FERRITIN 982* 961*   Urine analysis:    Component Value Date/Time   BILIRUBINUR Negative 09/08/2020 1550   PROTEINUR Negative 09/08/2020 1550   UROBILINOGEN 0.2 09/08/2020 1550   NITRITE Negative 09/08/2020 1550   LEUKOCYTESUR Moderate (2+) (A) 09/08/2020  1550   Sepsis Labs: @LABRCNTIP (procalcitonin:4,lacticidven:4)  ) Recent Results (from the past 240 hour(s))  Resp Panel by RT-PCR (Flu A&B, Covid) Nasopharyngeal Swab     Status: Abnormal   Collection Time: 11/12/21  6:51 PM   Specimen: Nasopharyngeal Swab; Nasopharyngeal(NP) swabs in vial transport medium  Result Value Ref Range Status   SARS Coronavirus 2 by RT PCR POSITIVE (A) NEGATIVE Final    Comment: RESULT CALLED TO, READ BACK BY AND VERIFIED WITH: jaimie oneal, rn 11/12/21 1937 src (NOTE) SARS-CoV-2 target nucleic acids are DETECTED.  The SARS-CoV-2 RNA is generally detectable in upper respiratory specimens during the acute phase of infection. Positive results are indicative of the presence of the identified virus, but do not rule out bacterial infection or co-infection with other pathogens not detected by the test. Clinical correlation with patient history and other diagnostic information is necessary to determine patient infection status. The expected result  is Negative.  Fact Sheet for Patients: EntrepreneurPulse.com.au  Fact Sheet for Healthcare Providers: IncredibleEmployment.be  This test is not yet approved or cleared by the [place] FDA and  has been authorized for detection and/or diagnosis of SARS-CoV-2 by FDA under an Emergency Use Authorization (EUA).  This EUA will remain in effect (meaning this test can be  used) for the duration of  the COVID-19 declaration under Section 564(b)(1) of the Act, 21 U.S.C. section 360bbb-3(b)(1), unless the authorization is terminated or revoked sooner.     Influenza A by PCR NEGATIVE NEGATIVE Final   Influenza B by PCR NEGATIVE NEGATIVE Final    Comment: (NOTE) The Xpert Xpress SARS-CoV-2/FLU/RSV plus assay is intended as an aid in the diagnosis of influenza from Nasopharyngeal swab specimens and should not be used as a sole basis for treatment. Nasal washings and aspirates are unacceptable for Xpert Xpress SARS-CoV-2/FLU/RSV testing.  Fact Sheet for Patients: EntrepreneurPulse.com.au  Fact Sheet for Healthcare Providers: IncredibleEmployment.be  This test is not yet approved or cleared by the [place] FDA and has been authorized for detection and/or diagnosis of SARS-CoV-2 by FDA under an Emergency Use Authorization (EUA). This EUA will remain in effect (meaning this test can be used) for the duration of the COVID-19 declaration under Section 564(b)(1) of the Act, 21 U.S.C. section 360bbb-3(b)(1), unless the authorization is terminated or revoked.  Performed at [facility], [address] [place], [place] [address]   Blood Culture (routine x 2)     Status: None (Preliminary result)   Collection Time: 11/13/21  2:12 PM   Specimen: BLOOD  Result Value Ref Range Status   Specimen Description   Final    BLOOD LEFT ANTECUBITAL Performed at [facility]  [address], [place], [place] [address]    Special Requests   Final    BOTTLES DRAWN AEROBIC ONLY Blood Culture adequate volume Performed at [facility] [address] [place], [place] [address]    Culture   Final    NO GROWTH 4 DAYS Performed at [facility], [place] [address] [place], [place] [address]    Report Status PENDING  Incomplete  Blood Culture (routine x 2)     Status: None (Preliminary result)   Collection Time: 11/13/21  2:12 PM   Specimen: BLOOD  Result Value Ref Range Status   Specimen Description   Final    BLOOD RIGHT ANTECUBITAL Performed at [facility] [address] [place], [place] [address]    Special Requests  Final    BOTTLES DRAWN AEROBIC ONLY Blood Culture adequate volume Performed at [facility], [address], [place], [place] [address]    Culture   Final    NO GROWTH 4 DAYS Performed at [facility], [address], [place], [place] [address]    Report Status PENDING  Incomplete  Gastrointestinal Panel by PCR , Stool     Status: None   Collection Time: 11/13/21  7:29 PM   Specimen: Stool  Result Value Ref Range Status   Campylobacter species NOT DETECTED NOT DETECTED Final   Plesimonas shigelloides NOT DETECTED NOT DETECTED Final   Salmonella species NOT DETECTED NOT DETECTED Final   Yersinia enterocolitica NOT DETECTED NOT DETECTED Final   Vibrio species NOT DETECTED NOT DETECTED Final   Vibrio cholerae NOT DETECTED NOT DETECTED Final   Enteroaggregative E coli (EAEC) NOT DETECTED NOT DETECTED Final   Enteropathogenic E coli (EPEC) NOT DETECTED NOT DETECTED Final   Enterotoxigenic E coli (ETEC) NOT DETECTED NOT DETECTED Final   Shiga like toxin producing E coli (STEC) NOT DETECTED NOT DETECTED Final   Shigella/Enteroinvasive E coli (EIEC) NOT DETECTED NOT DETECTED Final   Cryptosporidium NOT DETECTED NOT DETECTED Final   Cyclospora cayetanensis NOT DETECTED NOT DETECTED  Final   Entamoeba histolytica NOT DETECTED NOT DETECTED Final   Giardia lamblia NOT DETECTED NOT DETECTED Final   Adenovirus F40/41 NOT DETECTED NOT DETECTED Final   Astrovirus NOT DETECTED NOT DETECTED Final   Norovirus GI/GII NOT DETECTED NOT DETECTED Final   Rotavirus A NOT DETECTED NOT DETECTED Final   Sapovirus (I, II, IV, and V) NOT DETECTED NOT DETECTED Final    Comment: Performed at [facility], [address] [place], [place] [address]  C Difficile Quick Screen w PCR reflex     Status: None   Collection Time: 11/13/21  7:29 PM   Specimen: STOOL  Result Value Ref Range Status   C Diff antigen NEGATIVE NEGATIVE Final   C Diff toxin NEGATIVE NEGATIVE Final   C Diff interpretation NEGATIVE  Final    Comment: Performed at [facility], [address], [place], [place] [address]      Studies: CT Angio Chest PE W and/or Wo Contrast  Result Date: 11/16/2021 CLINICAL DATA:  COVID positive.  Pulmonary embolism suspected. EXAM: CT ANGIOGRAPHY CHEST WITH CONTRAST TECHNIQUE: Multidetector CT imaging of the chest was performed using the standard protocol during bolus administration of intravenous contrast. Multiplanar CT image reconstructions and MIPs were obtained to evaluate the vascular anatomy. CONTRAST:  OMNIPAQUE IOHEXOL 350 MG/ML SOLN COMPARISON:  12/17/2019 FINDINGS: Cardiovascular: Heart is enlarged. No substantial pericardial effusion. Coronary artery calcification is evident. Moderate atherosclerotic calcification is noted in the wall of the thoracic aorta. There is no filling defect within the opacified pulmonary arteries to suggest the presence of an acute pulmonary embolus. Mediastinum/Nodes: No mediastinal lymphadenopathy. There is no hilar lymphadenopathy. The esophagus has normal imaging features. There is no axillary lymphadenopathy. Lungs/Pleura: Mosaic ground-glass attenuation in both lungs is nonspecific but likely reflects air trapping.  Calcified pleural plaques bilaterally are consistent with prior asbestos exposure. Architectural distortion and scarring noted in the peripheral lungs bilaterally. Upper Abdomen: Unremarkable. Musculoskeletal: No worrisome lytic or sclerotic osseous abnormality. Review of the MIP images confirms the above findings. IMPRESSION: 1. No CT evidence for acute pulmonary embolus. 2. Mosaic ground-glass attenuation in both lungs is nonspecific but likely reflects air trapping. 3. Calcified pleural plaques bilaterally  consistent with prior asbestos exposure. Peripheral architectural distortion/scarring is compatible with asbestos related lung disease. 4. Aortic Atherosclerosis (ICD10-I70.0). Electronically Signed   By: Misty Stanley M.D.   On: 11/16/2021 17:09   DG Chest Port 1 View  Result Date: 11/16/2021 CLINICAL DATA:  Shortness of breath EXAM: PORTABLE CHEST 1 VIEW COMPARISON:  11/12/2021 FINDINGS: Cardiomegaly, vascular congestion. Bilateral interstitial and airspace opacities, which could reflect edema or infection superimposed on chronic lung disease. Extensive calcified pleural plaques on the left and right lung base. Fall no visible effusions. No acute bony abnormality. IMPRESSION: Cardiomegaly with vascular congestion. Bilateral interstitial/alveolar opacities could reflect edema or infection. Electronically Signed   By: Rolm Baptise M.D.   On: 11/16/2021 17:09    Scheduled Meds:  apixaban  5 mg Oral BID   Apremilast  30 mg Oral BID   Atropine Sulfate-NaCl  1 drop Left Eye QHS   dexamethasone  6 mg Oral Daily   doxycycline  100 mg Oral BID   erythromycin  1 application Both Eyes QHS   latanoprost  1 drop Right Eye QHS   magnesium oxide  400 mg Oral BID   metoprolol succinate  25 mg Oral Daily   potassium chloride  20 mEq Oral Daily   prednisoLONE acetate  1 drop Left Eye QHS   pregabalin  50 mg Oral BID   sodium chloride flush  3 mL Intravenous Q12H   tamsulosin  0.4 mg Oral QHS   temazepam   15 mg Oral QHS    Continuous Infusions:   LOS: 0 days     Kayleen Memos, MD [facility] Pager [phone]  If 7PM-7AM, please contact night-coverage [email or web address] Password [facility] 11/17/2021, 12:25 PM

## 2021-11-18 ENCOUNTER — Other Ambulatory Visit: Payer: Self-pay | Admitting: Physician Assistant

## 2021-11-18 ENCOUNTER — Inpatient Hospital Stay (INDEPENDENT_AMBULATORY_CARE_PROVIDER_SITE_OTHER): Payer: Medicare Other

## 2021-11-18 ENCOUNTER — Telehealth: Payer: Self-pay

## 2021-11-18 DIAGNOSIS — R55 Syncope and collapse: Secondary | ICD-10-CM

## 2021-11-18 LAB — GLUCOSE, CAPILLARY: Glucose-Capillary: 95 mg/dL (ref 70–99)

## 2021-11-18 LAB — CULTURE, BLOOD (ROUTINE X 2)
Culture: NO GROWTH
Culture: NO GROWTH
Special Requests: ADEQUATE
Special Requests: ADEQUATE

## 2021-11-18 MED ORDER — IPRATROPIUM-ALBUTEROL 20-100 MCG/ACT IN AERS
1.0000 | INHALATION_SPRAY | Freq: Four times a day (QID) | RESPIRATORY_TRACT | Status: DC
  Administered 2021-11-18 – 2021-11-19 (×3): 1 via RESPIRATORY_TRACT
  Filled 2021-11-18: qty 4

## 2021-11-18 MED ORDER — BENZONATATE 100 MG PO CAPS
200.0000 mg | ORAL_CAPSULE | Freq: Three times a day (TID) | ORAL | Status: DC
  Administered 2021-11-18 – 2021-11-19 (×3): 200 mg via ORAL
  Filled 2021-11-18 (×3): qty 2

## 2021-11-18 NOTE — TOC Initial Note (Addendum)
Transition of Care [facility]) - Initial/Assessment Note    Patient Details  Name: Maurice Reed MRN: [id number] Date of Birth: 1941/03/22  [facility]) CM/SW Contact:    Ida Rogue, LCSW Phone Number: 11/18/2021, 2:11 PM  Clinical Narrative:  Patient seen in follow up to recommendation of HH PT, OT.  Patient's wife picked up phone when I called into room, and patient said it was OK for me to speak with her. She states they have all needed DME at home, where they reside with son and daughter in law.  Ms Sones is welcoming of [facility] services, no preference of agencies.  I reached out to [facility] who declined patient, as did Cindie with [facility]. Will continue the search. TOC will continue to follow during the course of hospitalization.  Addendum: [facility] declined patient, as did [facility].   Addendum II: [facility] declined, as did [facility].  Addendum III: [facility] accepted.                   Expected Discharge Plan: Home w Home Health Services Barriers to Discharge: No Barriers Identified   Patient Goals and CMS Choice     Choice offered to / list presented to : Spouse  Expected Discharge Plan and Services Expected Discharge Plan: Home w Home Health Services   Discharge Planning Services: CM Consult Post Acute Care Choice: Home Health Living arrangements for the past 2 months: Single Family Home                                      Prior Living Arrangements/Services Living arrangements for the past 2 months: Single Family Home Lives with:: Spouse Patient language and need for interpreter reviewed:: Yes        Need for Family Participation in Patient Care: Yes (Comment) Care giver support system in place?: Yes (comment) Current home services: DME Criminal Activity/Legal Involvement Pertinent to Current Situation/Hospitalization: No - Comment as needed  Activities of Daily Living Home Assistive Devices/Equipment: BIPAP, Dentures (specify type),  Eyeglasses, Bedside commode/3-in-1, Oxygen, Shower chair with back, Walker (specify type) ADL Screening (condition at time of admission) Patient's cognitive ability adequate to safely complete daily activities?: Yes Is the patient deaf or have difficulty hearing?: Yes Does the patient have difficulty seeing, even when wearing glasses/contacts?: No Does the patient have difficulty concentrating, remembering, or making decisions?: No Patient able to express need for assistance with ADLs?: Yes Does the patient have difficulty dressing or bathing?: Yes Independently performs ADLs?: No Communication: Independent Dressing (OT): Needs assistance Is this a change from baseline?: Pre-admission baseline Grooming: Needs assistance Is this a change from baseline?: Pre-admission baseline Feeding: Needs assistance Is this a change from baseline?: Pre-admission baseline Bathing: Needs assistance Is this a change from baseline?: Pre-admission baseline Toileting: Needs assistance Is this a change from baseline?: Pre-admission baseline In/Out Bed: Needs assistance Is this a change from baseline?: Pre-admission baseline Walks in Home: Needs assistance Is this a change from baseline?: Pre-admission baseline Does the patient have difficulty walking or climbing stairs?: Yes Weakness of Legs: Both Weakness of Arms/Hands: None  Permission Sought/Granted Permission sought to share information with : Family Supports Permission granted to share information with : Yes, Verbal Permission Granted  Share Information with NAME: Coston, Mandato (Spouse)   [phone]           Emotional Assessment  Alcohol / Substance Use: Not Applicable Psych Involvement: No (comment)  Admission diagnosis:  Syncope [R55] Syncope, unspecified syncope type [R55] COVID [U07.1] Patient Active Problem List   Diagnosis Date Noted   Syncope 11/16/2021   Hypokalemia 11/16/2021   (HFpEF) heart failure with preserved  ejection fraction (HCC) 11/16/2021   Pneumonia due to COVID-19 virus 11/12/2021   Secondary hypercoagulable state (HCC) 06/08/2021   Hyperlipidemia 01/27/2021   Mild aortic stenosis 01/27/2021   Chronic anticoagulation 04/16/2020   Chronic constipation 03/03/2020   Bilateral lower abdominal discomfort 03/03/2020   Chronic respiratory failure with hypoxia (HCC) 12/23/2019   Shortness of breath 12/23/2019   Healthcare maintenance 08/16/2019   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   Sensorineural hearing loss (SNHL), 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 10/17/2018   Bilateral lower extremity edema 07/04/2018   Sclerosis of the skin 06/26/2018   ILD (interstitial lung disease) (HCC) 05/22/2018   Insomnia 05/22/2018   Erectile dysfunction 05/22/2018   Obesity    Kidney stones    Essential hypertension    GERD (gastroesophageal reflux disease)    Former smoker    Arthritis    Spondylosis without myelopathy or radiculopathy, 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

## 2021-11-18 NOTE — Evaluation (Addendum)
Occupational Therapy Evaluation Patient Details Name: Maurice Reed MRN: [id number] DOB: 12-15-1940 Today's Date: 11/18/2021   History of Present Illness Maurice Reed is a 80 y.o. male with medical history significant for permanent atrial fibrillation on Eliquis, emphysema/ILD, chronic respiratory failure with hypoxia on 3 L O2 at rest and 4-5 L with exertion, HFpEF (EF 55% by TTE 09/30/2021), history of PE, HTN, chronic pain syndrome, and OSA on BiPAP who presented to the ED for evaluation of syncope   Clinical Impression   [name] is an 42 year man who presents with decreased activity tolerance, generalized weakness, impaired balance and decreased cardiopulmonary endurance resulting in a decline in functional abilities. Patient overall min guard for ambulation and ADLs with RW and 3 L [place]. O2 sat WFL with activity on 3 L [place] and BP dropped to 124/60 from 152/83 with standing. He reports some light headedness with change of position but he reports this is chronic as well. He is close to his baseline but reports feeling weak and fatigued after ADLs. Patient will benefit from skilled OT services while in hospital to improve deficits and learn compensatory strategies as needed in order to return to PLOF.  Recommend HH OT at discharge.      Recommendations for follow up therapy are one component of a multi-disciplinary discharge planning process, led by the attending physician.  Recommendations may be updated based on patient status, additional functional criteria and insurance authorization.   Follow Up Recommendations  Home health OT    Assistance Recommended at Discharge Intermittent Supervision/Assistance  Functional Status Assessment  Patient has had a recent decline in their functional status and demonstrates the ability to make significant improvements in function in a reasonable and predictable amount of time.  Equipment Recommendations  None recommended by OT    Recommendations for  Other Services       Precautions / Restrictions Precautions Precautions: Fall Restrictions Weight Bearing Restrictions: No      Mobility Bed Mobility Overal bed mobility: Needs Assistance Bed Mobility: Supine to Sit     Supine to sit: Supervision          Transfers Overall transfer level: Needs assistance Equipment used: Rolling walker (2 wheels) Transfers: Sit to/from Stand Sit to Stand: Min guard           General transfer comment: Min guard to stand and ambulate to bathroom with RW on 3 L [place].      Balance Overall balance assessment: Needs assistance Sitting-balance support: No upper extremity supported;Feet supported Sitting balance-Leahy Scale: Good     Standing balance support: During functional activity;Bilateral upper extremity supported Standing balance-Leahy Scale: Poor Standing balance comment: reliant on and holds for balance                           ADL either performed or assessed with clinical judgement   ADL Overall ADL's : Needs assistance/impaired Eating/Feeding: Independent   Grooming: Standing;Min guard;Wash/dry hands   Upper Body Bathing: Set up;Sitting   Lower Body Bathing: Sit to/from stand;Min guard   Upper Body Dressing : Set up;Sitting   Lower Body Dressing: Sitting/lateral leans;Sit to/from stand Lower Body Dressing Details (indicate cue type and reason): able to don socks in seated position, demonstrated ability to perform clothing management with sit to stand Toilet Transfer: Min guard;Regular Toilet;Rolling walker (2 wheels);Grab bars   Toileting- Clothing Manipulation and Hygiene: Min guard;Sitting/lateral lean Toileting - Clothing Manipulation Details (indicate  cue type and reason): able to wipe self in seated position Tub/ Shower Transfer: Min guard;Shower seat;Rolling walker (2 wheels)   Functional mobility during ADLs: Min guard;Rolling walker (2 wheels)       Vision   Vision Assessment?: No  apparent visual deficits     Perception     Praxis      Pertinent Vitals/Pain Pain Assessment: No/denies pain     Hand Dominance Right   Extremity/Trunk Assessment Upper Extremity Assessment Upper Extremity Assessment: Overall WFL for tasks assessed   Lower Extremity Assessment Lower Extremity Assessment: Overall WFL for tasks assessed;RLE deficits/detail;LLE deficits/detail RLE Sensation: history of peripheral neuropathy (reports neuropathy "halfway to knees") LLE Sensation: history of peripheral neuropathy (reports neuropathy "halfway to knees")       Communication Communication Communication: No difficulties   Cognition Arousal/Alertness: Awake/alert Behavior During Therapy: WFL for tasks assessed/performed Overall Cognitive Status: Within Functional Limits for tasks assessed                                       General Comments       Exercises     Shoulder Instructions      Home Living Family/patient expects to be discharged to:: Private residence Living Arrangements: Spouse/significant other;Children Available Help at Discharge: Family;Available 24 hours/day Type of Home: House Home Access: Stairs to enter [employer] of Steps: 3   Home Layout: Two level;Able to live on main level with bedroom/bathroom     Bathroom Shower/Tub: Walk-in shower         Home Equipment: Educational psychologist (4 wheels);Wheelchair - manual;BSC/3in1          Prior Functioning/Environment Prior Level of Function : Independent/Modified Independent             Mobility Comments: uses rollator and 3-5 L oxygen at home. reports a fairly sedentary lifestyle - ambulation to bathroom and kitchen mostly. Reports difficulty with prolonged standing due to neuropatthy. ADLs Comments: modified independent, supervision for shower transfer and bathing        OT Problem List: Decreased activity tolerance;Impaired balance (sitting and/or  standing);Cardiopulmonary status limiting activity;Obesity      OT Treatment/Interventions: Self-care/ADL training;DME and/or AE instruction;Therapeutic activities;Balance training;Patient/family education    OT Goals(Current goals can be found in the care plan section) Acute Rehab OT Goals Patient Stated Goal: to return to baseline OT Goal Formulation: With patient Time For Goal Achievement: 12/02/21 Potential to Achieve Goals: Good  OT Frequency: Min 2X/week   Barriers to D/C:            Co-evaluation              AM-PAC OT "6 Clicks" Daily Activity     Outcome Measure Help from another person eating meals?: None Help from another person taking care of personal grooming?: A Little Help from another person toileting, which includes using toliet, bedpan, or urinal?: A Little Help from another person bathing (including washing, rinsing, drying)?: A Little Help from another person to put on and taking off regular upper body clothing?: A Little Help from another person to put on and taking off regular lower body clothing?: A Little 6 Click Score: 19   End of Session Equipment Utilized During Treatment: Rolling walker (2 wheels);Oxygen Nurse Communication: Mobility status  Activity Tolerance: Patient tolerated treatment well Patient left: in chair;with call bell/phone within reach;with chair alarm set  OT Visit Diagnosis:  Unsteadiness on feet (R26.81);Muscle weakness (generalized) (M62.81)                Time: 0454-0981 OT Time Calculation (min): 43 min Charges:  OT General Charges $OT Visit: 1 Visit OT Evaluation $OT Eval Low Complexity: 1 Low OT Treatments $Self Care/Home Management : 23-37 mins  Ziva Nunziata, OTR/L Acute Care Rehab Services  Office [phone] Pager: [phone]   Kelli Churn 11/18/2021, 11:11 AM

## 2021-11-18 NOTE — Progress Notes (Signed)
Per MD request, attesting that per [facility], patient received all 5 doses of remdesivir during the previous admission.   Luisa Hart, PharmD  11/18/21 6:03 PM

## 2021-11-18 NOTE — Progress Notes (Addendum)
PROGRESS NOTE  Maurice Reed MWN:[id number] DOB: [birth year] DOA: 11/16/2021 PCP: Jarold Motto, PA  HPI/Recap of past 24 hours: Maurice Reed is a 80 y.o. male with medical history significant for permanent atrial fibrillation on Eliquis, emphysema/ILD, chronic respiratory failure with hypoxia on 3 L O2 at rest and 4-5 L with exertion, HFpEF (EF 55% by TTE 09/30/2021), history of PE, HTN, chronic pain syndrome, and OSA on BiPAP who presented to [facility] ED for evaluation of syncope witnessed by his wife.  Just released from the hospital on the day of admission after hospitalization for COVID-19 viral infection.  His wife reports that he lost consciousness for 3-4 minutes.  Associated with tremors in his lower extremities, foaming at his mouth, incontinence of urine and stools.  Did not hit his head.  He was brought into the ED for further evaluation.  2D echo obtained on 11/17/2021 showed EF 60 to 65%, moderate LVH, normal RV function, mild MR, mild AS.  Positive for orthostatic hypotension.  Seen by cardiology with plan for Zio patch x2 weeks on discharge.  Cardiology recommended to hold torsemide on discharge and take as needed if gains more than 3 pounds in 1 day or 5 pounds in 1 week.  Patient advised to follow-up with his cardiologist at [facility].    11/18/2021: Patient seen at his bedside.  There were no acute events overnight.  He has no new complaints.  He worked with PT with recommendation for home health PT.  TOC assisting with home health services arrangement.   Assessment/Plan: Principal Problem:   Syncope Active Problems:   OSA treated with BiPAP   Essential hypertension   Atrial fibrillation (HCC)   ILD (interstitial lung disease) (HCC)   Chronic respiratory failure with hypoxia (HCC)   Hypokalemia   (HFpEF) heart failure with preserved ejection fraction (HCC)  Syncope, unclear etiology, rule out cardiogenic etiology  Positive orthostatic hypotension EEG negative for seizure activity   His wife reports that he lost consciousness for 3-4 minutes.  Associated with tremors in his lower extremities, foaming at his mouth, incontinence of urine and stools.   2D echo obtained with results as stated above. Seen by cardiology with recommendation as stated above. Continue to hold off diuretics Continue PT with assistance and fall precautions  Orthostatic hypotension Positive orthostatic vital signs on 20 1522 Continue to hold off diuretics Continue fall precaution TED hose  Resolved post repletion: Hypokalemia in the setting of diuretics Patient is on torsemide but also on spironolactone which is potassium sparing. He presented with potassium 2.8> 4.4 This was repleted orally Mildly volume depleted on exam Home torsemide was held, continue to hold per cardiology.  COVID-19 viral infection Prior to this admission patient has completed 4 doses of remdesivir His wife requested fifth dose Remdesivir 100 mg x 1 dose ordered on 11/17/21. Continue p.o. Decadron Continue bronchodilators Maintain O2 saturation greater than 90%.  Permanent atrial fibrillation Rate is controlled, continue home Toprol-XL 25 mg daily Continue Eliquis for primary CVA prevention  Neuropathic pain Patient on Lyrica  BPH Patient on Flomax, continue  Chronic insomnia Patient on temazepam  Morbid obesity BMS of 37 Recommend weight loss outpatient with healthy dieting and regular physical activity  OSA Resume home regimen On CPAP, continue    Code Status: DNR  Family Communication: Updated his wife via phone  Disposition Plan: Likely will discharge to home with home health services.   Consultants: Cardiology, signed off.  Procedures: 2D echo 11/17/2021  Antimicrobials:  Antiviral IV remdesivir  DVT prophylaxis: Eliquis  Status is: Observation  Patient will require at least 2 midnights for further evaluation and treatment of present condition.      Objective: Vitals:    11/18/21 0145 11/18/21 0332 11/18/21 1019 11/18/21 1500  BP: 108/73 (!) 124/95 130/83 (!) 147/80  Pulse: 67 78 (!) 101 87  Resp: 20 20  16   Temp: 98.3 F (36.8 C) 97.6 F (36.4 C)  98.4 F (36.9 C)  TempSrc:    Oral  SpO2: 99% 100% 99% 100%  Weight:      Height:        Intake/Output Summary (Last 24 hours) at 11/18/2021 1519 Last data filed at 11/18/2021 0402 Gross per 24 hour  Intake 240 ml  Output 500 ml  Net -260 ml   Filed Weights   11/16/21 1605 11/17/21 1751  Weight: 121.8 kg 117.2 kg    Exam:  General: 80 y.o. year-old male obese in no acute stress.  He is alert oriented x3.  Very hard of hearing.   Cardiovascular: Irregular rate and rhythm no rubs or gallops.  No JVD or thyromegaly noted.   Respiratory: Clear to auscultation no wheezes or rales.   Abdomen: Soft nontender normal bowel sounds noted. Musculoskeletal: No lower extremity edema bilaterally.   Skin: No ulcerative lesions noted. Psychiatry: Mood is appropriate for condition and setting. Neuro: Moves all 4 extremities, nonfocal.   Data Reviewed: CBC: Recent Labs  Lab 11/13/21 0736 11/14/21 0405 11/15/21 0347 11/16/21 0402 11/16/21 1623 11/16/21 1645 11/17/21 0440  WBC 4.9 3.0* 3.7* 4.3 9.2  --  8.4  NEUTROABS 2.7 2.0 2.3 3.0 6.9  --   --   HGB 11.8* 13.8 13.1 14.3 14.7 15.0 14.3  HCT 36.2* 40.9 39.0 42.5 44.4 44.0 43.0  MCV 105.8* 102.5* 101.6* 100.2* 102.3*  --  102.1*  PLT 170 133* 211 211 229  --  225   Basic Metabolic Panel: Recent Labs  Lab 11/14/21 0405 11/15/21 0347 11/16/21 0402 11/16/21 1623 11/16/21 1645 11/17/21 0440  NA 139 139 137 141 140 138  K 3.8 3.3* 3.7 2.8* 3.0* 4.4  CL 96* 96* 92* 96* 93* 96*  CO2 33* 35* 33* 34*  --  33*  GLUCOSE 129* 139* 151* 95 98 133*  BUN 16 20 25* 24* 24* 23  CREATININE 0.88 0.94 0.93 0.91 1.00 0.96  CALCIUM 8.6* 8.8* 8.9 8.0*  --  8.5*  MG  --   --  1.5* 1.8  --  2.0  PHOS  --   --  3.8  --   --  3.3   GFR: Estimated Creatinine  Clearance: 79.9 mL/min (by C-G formula based on SCr of 0.96 mg/dL). Liver Function Tests: Recent Labs  Lab 11/13/21 0736 11/14/21 0405 11/15/21 0347 11/16/21 0402 11/16/21 1623  AST 18 22 20 23 29   ALT 11 15 14 15 19   ALKPHOS 82 89 80 83 81  BILITOT 0.3 0.5 0.6 0.5 0.5  PROT 6.4* 6.9 6.8 6.9 6.7  ALBUMIN 3.4* 3.4* 3.4* 3.4* 3.2*   No results for input(s): LIPASE, AMYLASE in the last 168 hours. No results for input(s): AMMONIA in the last 168 hours. Coagulation Profile: No results for input(s): INR, PROTIME in the last 168 hours. Cardiac Enzymes: No results for input(s): CKTOTAL, CKMB, CKMBINDEX, TROPONINI in the last 168 hours. BNP (last 3 results) No results for input(s): PROBNP in the last 8760 hours. HbA1C: No results for input(s): HGBA1C in the last  72 hours. CBG: Recent Labs  Lab 11/17/21 0549 11/18/21 0748  GLUCAP 127* 95   Lipid Profile: No results for input(s): CHOL, HDL, LDLCALC, TRIG, CHOLHDL, LDLDIRECT in the last 72 hours. Thyroid Function Tests: No results for input(s): TSH, T4TOTAL, FREET4, T3FREE, THYROIDAB in the last 72 hours. Anemia Panel: Recent Labs    11/16/21 0402 11/17/21 0440  FERRITIN 982* 961*   Urine analysis:    Component Value Date/Time   BILIRUBINUR Negative 09/08/2020 1550   PROTEINUR Negative 09/08/2020 1550   UROBILINOGEN 0.2 09/08/2020 1550   NITRITE Negative 09/08/2020 1550   LEUKOCYTESUR Moderate (2+) (A) 09/08/2020 1550   Sepsis Labs: @LABRCNTIP (procalcitonin:4,lacticidven:4)  ) Recent Results (from the past 240 hour(s))  Resp Panel by RT-PCR (Flu A&B, Covid) Nasopharyngeal Swab     Status: Abnormal   Collection Time: 11/12/21  6:51 PM   Specimen: Nasopharyngeal Swab; Nasopharyngeal(NP) swabs in vial transport medium  Result Value Ref Range Status   SARS Coronavirus 2 by RT PCR POSITIVE (A) NEGATIVE Final    Comment: RESULT CALLED TO, READ BACK BY AND VERIFIED WITH: jaimie oneal, rn 11/12/21 1937  src (NOTE) SARS-CoV-2 target nucleic acids are DETECTED.  The SARS-CoV-2 RNA is generally detectable in upper respiratory specimens during the acute phase of infection. Positive results are indicative of the presence of the identified virus, but do not rule out bacterial infection or co-infection with other pathogens not detected by the test. Clinical correlation with patient history and other diagnostic information is necessary to determine patient infection status. The expected result is Negative.  Fact Sheet for Patients: 14/9/22  Fact Sheet for Healthcare Providers: BloggerCourse.com  This test is not yet approved or cleared by the SeriousBroker.it FDA and  has been authorized for detection and/or diagnosis of SARS-CoV-2 by FDA under an Emergency Use Authorization (EUA).  This EUA will remain in effect (meaning this test can be  used) for the duration of  the COVID-19 declaration under Section 564(b)(1) of the Act, 21 U.S.C. section 360bbb-3(b)(1), unless the authorization is terminated or revoked sooner.     Influenza A by PCR NEGATIVE NEGATIVE Final   Influenza B by PCR NEGATIVE NEGATIVE Final    Comment: (NOTE) The Xpert Xpress SARS-CoV-2/FLU/RSV plus assay is intended as an aid in the diagnosis of influenza from Nasopharyngeal swab specimens and should not be used as a sole basis for treatment. Nasal washings and aspirates are unacceptable for Xpert Xpress SARS-CoV-2/FLU/RSV testing.  Fact Sheet for Patients: [facility]  Fact Sheet for Healthcare Providers: BloggerCourse.com  This test is not yet approved or cleared by the SeriousBroker.it FDA and has been authorized for detection and/or diagnosis of SARS-CoV-2 by FDA under an Emergency Use Authorization (EUA). This EUA will remain in effect (meaning this test can be used) for the duration of the COVID-19  declaration under Section 564(b)(1) of the Act, 21 U.S.C. section 360bbb-3(b)(1), unless the authorization is terminated or revoked.  Performed at [facility], [address] [place], [place]   Blood Culture (routine x 2)     Status: None   Collection Time: 11/13/21  2:12 PM   Specimen: BLOOD  Result Value Ref Range Status   Specimen Description   Final    BLOOD LEFT ANTECUBITAL Performed at [facility] [address], [place], [place]    Special Requests   Final    BOTTLES DRAWN AEROBIC ONLY Blood Culture adequate volume Performed at [facility], [address], [place], [place] [address]    Culture   Final    NO GROWTH 5 DAYS Performed at [facility], [address] [place], [place] [address]    Report Status 11/18/2021 FINAL  Final  Blood Culture (routine x 2)     Status: None   Collection Time: 11/13/21  2:12 PM   Specimen: BLOOD  Result Value Ref Range Status   Specimen Description   Final    BLOOD RIGHT ANTECUBITAL Performed at [facility], [address] [place], [place] [address]    Special Requests   Final    BOTTLES DRAWN AEROBIC ONLY Blood Culture adequate volume Performed at [facility], [address], [place], [place] [address]    Culture   Final    NO GROWTH 5 DAYS Performed at [facility], [address] [place], [place] [address]    Report Status 11/18/2021 FINAL  Final  Gastrointestinal Panel by PCR , Stool     Status: None   Collection Time: 11/13/21  7:29 PM   Specimen: Stool  Result Value Ref Range Status   Campylobacter species NOT DETECTED NOT DETECTED Final   Plesimonas shigelloides NOT DETECTED NOT DETECTED Final   Salmonella species NOT DETECTED NOT DETECTED Final   Yersinia enterocolitica NOT DETECTED NOT DETECTED Final   Vibrio species NOT DETECTED NOT DETECTED Final   Vibrio cholerae NOT DETECTED NOT  DETECTED Final   Enteroaggregative E coli (EAEC) NOT DETECTED NOT DETECTED Final   Enteropathogenic E coli (EPEC) NOT DETECTED NOT DETECTED Final   Enterotoxigenic E coli (ETEC) NOT DETECTED NOT DETECTED Final   Shiga like toxin producing E coli (STEC) NOT DETECTED NOT DETECTED Final   Shigella/Enteroinvasive E coli (EIEC) NOT DETECTED NOT DETECTED Final   Cryptosporidium NOT DETECTED NOT DETECTED Final   Cyclospora cayetanensis NOT DETECTED NOT DETECTED Final   Entamoeba histolytica NOT DETECTED NOT DETECTED Final   Giardia lamblia NOT DETECTED NOT DETECTED Final   Adenovirus F40/41 NOT DETECTED NOT DETECTED Final   Astrovirus NOT DETECTED NOT DETECTED Final   Norovirus GI/GII NOT DETECTED NOT DETECTED Final   Rotavirus A NOT DETECTED NOT DETECTED Final   Sapovirus (I, II, IV, and V) NOT DETECTED NOT DETECTED Final    Comment: Performed at [facility], [address] [place], [place] [address]  C Difficile Quick Screen w PCR reflex     Status: None   Collection Time: 11/13/21  7:29 PM   Specimen: STOOL  Result Value Ref Range Status   C Diff antigen NEGATIVE NEGATIVE Final   C Diff toxin NEGATIVE NEGATIVE Final   C Diff interpretation NEGATIVE  Final    Comment: Performed at [facility], [address] [place], [place] [address]      Studies: EEG adult  Result Date: 2021/11/20 Charlsie Quest, MD     November 20, 2021  4:12 PM Patient Name: RANSOM NICKSON MRN: [id number] Epilepsy Attending: Charlsie Quest Referring Physician/Provider: Dr Dow Adolph Date: November 20, 2021 Duration: 22.42 mins Patient history: 80 year old male with syncope, loss of consciousness for 3 to 4 minutes associated with tremors of lower extremities and foaming at as well as incontinent of urine and stools.  EEG to evaluate for procedure. Level of alertness: Awake, drowsy AEDs during EEG study: None Technical aspects: This EEG study was done with scalp electrodes positioned according to  the 10-20 International system of electrode placement. Electrical activity was acquired at  a sampling rate of 500Hz  and reviewed with a high frequency filter of 70Hz  and a low frequency filter of 1Hz . EEG data were recorded continuously and digitally stored. Description: The posterior dominant rhythm consists of 9 Hz activity of moderate voltage (25-35 uV) seen predominantly in posterior head regions, symmetric and reactive to eye opening and eye closing. Drowsiness was characterized by attenuation of the posterior background rhythm. Hyperventilation and photic stimulation were not performed.   IMPRESSION: This study is within normal limits. No seizures or epileptiform discharges were seen throughout the recording. Priyanka    Scheduled Meds:  apixaban  5 mg Oral BID   Apremilast  30 mg Oral BID   atropine  1 drop Left Eye QHS   dexamethasone  6 mg Oral Daily   doxycycline  100 mg Oral BID   erythromycin  1 application Both Eyes QHS   latanoprost  1 drop Right Eye QHS   magnesium oxide  400 mg Oral BID   mouth rinse  15 mL Mouth Rinse BID   metoprolol succinate  25 mg Oral Daily   potassium chloride  20 mEq Oral Daily   prednisoLONE acetate  1 drop Left Eye QHS   pregabalin  50 mg Oral BID   sodium chloride flush  3 mL Intravenous Q12H   tamsulosin  0.4 mg Oral QHS   temazepam  15 mg Oral QHS    Continuous Infusions:   LOS: 1 day     , MD [facility] Pager [phone]  If 7PM-7AM, please contact night-coverage [email or web address] Password [facility] 11/18/2021, 3:19 PM

## 2021-11-18 NOTE — Progress Notes (Signed)
At request of Dr. Bjorn Pippin, order and staff message has been sent to our [facility] office. Patient will receive a 2 week ZIO AT live monitor in the mail. Dr. Bjorn Pippin will read it. He can follow up with his cardiologist at [facility].

## 2021-11-18 NOTE — Progress Notes (Signed)
Wife at bedside, asked her to bring patient's [place] tomorrow for pharmacy to dispense.

## 2021-11-18 NOTE — Progress Notes (Addendum)
Progress Note  Patient Name: Maurice Reed Date of Encounter: 11/18/2021  [facility] Cardiologist: Quay Burow, MD   Subjective   Denies any CP or SOB. No significant dizziness   Inpatient Medications    Scheduled Meds:  apixaban  5 mg Oral BID   Apremilast  30 mg Oral BID   atropine  1 drop Left Eye QHS   dexamethasone  6 mg Oral Daily   doxycycline  100 mg Oral BID   erythromycin  1 application Both Eyes QHS   latanoprost  1 drop Right Eye QHS   magnesium oxide  400 mg Oral BID   mouth rinse  15 mL Mouth Rinse BID   metoprolol succinate  25 mg Oral Daily   potassium chloride  20 mEq Oral Daily   prednisoLONE acetate  1 drop Left Eye QHS   pregabalin  50 mg Oral BID   sodium chloride flush  3 mL Intravenous Q12H   tamsulosin  0.4 mg Oral QHS   temazepam  15 mg Oral QHS   Continuous Infusions:  PRN Meds: acetaminophen **OR** acetaminophen, albuterol, ipratropium-albuterol, ketorolac, ondansetron **OR** ondansetron (ZOFRAN) IV, oxyCODONE   Vital Signs    Vitals:   11/17/21 2157 11/18/21 0145 11/18/21 0332 11/18/21 1019  BP: 131/76 108/73 (!) 124/95 130/83  Pulse: 79 67 78 (!) 101  Resp: 20 20 20    Temp: 97.6 F (36.4 C) 98.3 F (36.8 C) 97.6 F (36.4 C)   TempSrc:      SpO2: 100% 99% 100% 99%  Weight:      Height:        Intake/Output Summary (Last 24 hours) at 11/18/2021 1022 Last data filed at 11/18/2021 0402 Gross per 24 hour  Intake 1065.19 ml  Output 500 ml  Net 565.19 ml   Last 3 Weights 11/17/2021 11/16/2021 11/13/2021  Weight (lbs) 258 lb 6.1 oz 268 lb 8 oz 268 lb 5 oz  Weight (kg) 117.2 kg 121.791 kg 121.706 kg      Telemetry    Atrial fibrillation, HR 70s overnight, no prolonged pauses or significant bradycardia - Personally Reviewed  ECG    No new ECG- Personally Reviewed  Physical Exam   GEN: No acute distress.   Neck: No JVD Cardiac: irregularly irregular, no murmurs, rubs, or gallops.  Respiratory: Clear to  auscultation bilaterally. GI: Soft, nontender, non-distended  MS: No edema; No deformity. Neuro:  Nonfocal  Psych: Normal affect   Labs    High Sensitivity Troponin:   Recent Labs  Lab 11/12/21 1931 11/12/21 2223 11/16/21 1623 11/16/21 1824  TROPONINIHS 7 7 7 7      Chemistry Recent Labs  Lab 11/15/21 0347 11/16/21 0402 11/16/21 1623 11/16/21 1645 11/17/21 0440  NA 139 137 141 140 138  K 3.3* 3.7 2.8* 3.0* 4.4  CL 96* 92* 96* 93* 96*  CO2 35* 33* 34*  --  33*  GLUCOSE 139* 151* 95 98 133*  BUN 20 25* 24* 24* 23  CREATININE 0.94 0.93 0.91 1.00 0.96  CALCIUM 8.8* 8.9 8.0*  --  8.5*  MG  --  1.5* 1.8  --  2.0  PROT 6.8 6.9 6.7  --   --   ALBUMIN 3.4* 3.4* 3.2*  --   --   AST 20 23 29   --   --   ALT 14 15 19   --   --   ALKPHOS 80 83 81  --   --   BILITOT 0.6 0.5 0.5  --   --  GFRNONAA >60 >60 >60  --  >60  ANIONGAP 8 12 11   --  9    Lipids  Recent Labs  Lab 11/12/21 2226  TRIG 116    Hematology Recent Labs  Lab 11/16/21 0402 11/16/21 1623 11/16/21 1645 11/17/21 0440  WBC 4.3 9.2  --  8.4  RBC 4.24 4.34  --  4.21*  HGB 14.3 14.7 15.0 14.3  HCT 42.5 44.4 44.0 43.0  MCV 100.2* 102.3*  --  102.1*  MCH 33.7 33.9  --  34.0  MCHC 33.6 33.1  --  33.3  RDW 12.3 12.4  --  12.5  PLT 211 229  --  225   Thyroid No results for input(s): TSH, FREET4 in the last 168 hours.  BNP Recent Labs  Lab 11/12/21 1931  BNP 120.9*    DDimer  Recent Labs  Lab 11/15/21 0347 11/16/21 0402 11/17/21 0440  DDIMER 0.79* 0.67* 0.83*     Radiology    CT Angio Chest PE W and/or Wo Contrast  Result Date: 11/16/2021 CLINICAL DATA:  COVID positive.  Pulmonary embolism suspected. EXAM: CT ANGIOGRAPHY CHEST WITH CONTRAST TECHNIQUE: Multidetector CT imaging of the chest was performed using the standard protocol during bolus administration of intravenous contrast. Multiplanar CT image reconstructions and MIPs were obtained to evaluate the vascular anatomy. CONTRAST:  155mL  OMNIPAQUE IOHEXOL 350 MG/ML SOLN COMPARISON:  12/17/2019 FINDINGS: Cardiovascular: Heart is enlarged. No substantial pericardial effusion. Coronary artery calcification is evident. Moderate atherosclerotic calcification is noted in the wall of the thoracic aorta. There is no filling defect within the opacified pulmonary arteries to suggest the presence of an acute pulmonary embolus. Mediastinum/Nodes: No mediastinal lymphadenopathy. There is no hilar lymphadenopathy. The esophagus has normal imaging features. There is no axillary lymphadenopathy. Lungs/Pleura: Mosaic ground-glass attenuation in both lungs is nonspecific but likely reflects air trapping. Calcified pleural plaques bilaterally are consistent with prior asbestos exposure. Architectural distortion and scarring noted in the peripheral lungs bilaterally. Upper Abdomen: Unremarkable. Musculoskeletal: No worrisome lytic or sclerotic osseous abnormality. Review of the MIP images confirms the above findings. IMPRESSION: 1. No CT evidence for acute pulmonary embolus. 2. Mosaic ground-glass attenuation in both lungs is nonspecific but likely reflects air trapping. 3. Calcified pleural plaques bilaterally consistent with prior asbestos exposure. Peripheral architectural distortion/scarring is compatible with asbestos related lung disease. 4. Aortic Atherosclerosis (ICD10-I70.0). Electronically Signed   By: Misty Stanley M.D.   On: 11/16/2021 17:09   DG Chest Port 1 View  Result Date: 11/16/2021 CLINICAL DATA:  Shortness of breath EXAM: PORTABLE CHEST 1 VIEW COMPARISON:  11/12/2021 FINDINGS: Cardiomegaly, vascular congestion. Bilateral interstitial and airspace opacities, which could reflect edema or infection superimposed on chronic lung disease. Extensive calcified pleural plaques on the left and right lung base. Fall no visible effusions. No acute bony abnormality. IMPRESSION: Cardiomegaly with vascular congestion. Bilateral interstitial/alveolar opacities  could reflect edema or infection. Electronically Signed   By: Rolm Baptise M.D.   On: 11/16/2021 17:09   EEG adult  Result Date: 11/17/2021 Lora Havens, MD     11/17/2021  4:12 PM Patient Name: Maurice Reed MRN: [id number] Epilepsy Attending: Lora Havens Referring Physician/Provider: Dr Irene Pap Date: 11/17/2021 Duration: 22.42 mins Patient history: 80 year old male with syncope, loss of consciousness for 3 to 4 minutes associated with tremors of lower extremities and foaming at as well as incontinent of urine and stools.  EEG to evaluate for procedure. Level of alertness: Awake, drowsy AEDs during EEG study: None  Technical aspects: This EEG study was done with scalp electrodes positioned according to the 10-20 International system of electrode placement. Electrical activity was acquired at a sampling rate of 500Hz  and reviewed with a high frequency filter of 70Hz  and a low frequency filter of 1Hz . EEG data were recorded continuously and digitally stored. Description: The posterior dominant rhythm consists of 9 Hz activity of moderate voltage (25-35 uV) seen predominantly in posterior head regions, symmetric and reactive to eye opening and eye closing. Drowsiness was characterized by attenuation of the posterior background rhythm. Hyperventilation and photic stimulation were not performed.   IMPRESSION: This study is within normal limits. No seizures or epileptiform discharges were seen throughout the recording.   ECHOCARDIOGRAM LIMITED  Result Date: 11/17/2021    ECHOCARDIOGRAM LIMITED REPORT   Patient Name:   SELDON BARRELL Whittington Date of Exam: 11/17/2021 Medical Rec #:  11/19/2021    Height:       71.0 in Accession #:    [employer]   Weight:       268.5 lb Date of Birth:  07-03-1941    BSA:          2.390 m Patient Age:    80 years     BP:           122/80 mmHg Patient Gender: M            HR:           79 bpm. Exam Location:  Inpatient Procedure: Limited Echo, Color Doppler and  Cardiac Doppler Indications:    R55 Syncope  History:        Patient has prior history of Echocardiogram examinations, most                 recent 04/27/2020. Pulmonary HTN, Arrythmias:Atrial Fibrillation;                 Risk Factors:Hypertension, Dyslipidemia and Sleep Apnea.  Sonographer:    02/18/1941 Senior RDCS Referring Phys: 01-13-1989 VISHAL R PATEL  Sonographer Comments: COVID+ at time of study IMPRESSIONS  1. Left ventricular ejection fraction, by estimation, is 60 to 65%. The left ventricle has normal function. The left ventricle has no regional wall motion abnormalities. There is moderate concentric left ventricular hypertrophy.  2. Right ventricular systolic function is normal. The right ventricular size is normal. There is normal pulmonary artery systolic pressure. The estimated right ventricular systolic pressure is 31.5 mmHg.  3. Mild mitral valve regurgitation.  4. The aortic valve was not well visualized. There is mild calcification of the aortic valve. There is mild thickening of the aortic valve. Mild aortic valve stenosis. Aortic valve mean gradient measures 13.0 mmHg.  5. The inferior vena cava is normal in size with greater than 50% respiratory variability, suggesting right atrial pressure of 3 mmHg. FINDINGS  Left Ventricle: Left ventricular ejection fraction, by estimation, is 60 to 65%. The left ventricle has normal function. The left ventricle has no regional wall motion abnormalities. There is moderate concentric left ventricular hypertrophy. Right Ventricle: The right ventricular size is normal. No increase in right ventricular wall thickness. Right ventricular systolic function is normal. There is normal pulmonary artery systolic pressure. The tricuspid regurgitant velocity is 2.67 m/s, and  with an assumed right atrial pressure of 3 mmHg, the estimated right ventricular systolic pressure is 31.5 mmHg. Pericardium: Presence of epicardial fat layer. Mitral Valve: Mild mitral annular  calcification. Mild mitral valve regurgitation. Tricuspid Valve: The tricuspid valve  is normal in structure. Tricuspid valve regurgitation is mild . No evidence of tricuspid stenosis. Aortic Valve: The aortic valve was not well visualized. There is mild calcification of the aortic valve. There is mild thickening of the aortic valve. There is mild aortic valve annular calcification. Mild aortic stenosis is present. Aortic valve mean gradient measures 13.0 mmHg. Aortic valve peak gradient measures 21.3 mmHg. Aortic valve area, by VTI measures 2.72 cm. Pulmonic Valve: The pulmonic valve was not well visualized. Pulmonic valve regurgitation is not visualized. No evidence of pulmonic stenosis. Aorta: The aortic root is normal in size and structure. Venous: The inferior vena cava is normal in size with greater than 50% respiratory variability, suggesting right atrial pressure of 3 mmHg. LEFT VENTRICLE PLAX 2D LVOT diam:     2.30 cm LV SV:         128 LV SV Index:   54 LVOT Area:     4.15 cm  AORTIC VALVE AV Area (Vmax):    2.67 cm AV Area (Vmean):   2.62 cm AV Area (VTI):     2.72 cm AV Vmax:           231.00 cm/s AV Vmean:          173.000 cm/s AV VTI:            0.471 m AV Peak Grad:      21.3 mmHg AV Mean Grad:      13.0 mmHg LVOT Vmax:         148.50 cm/s LVOT Vmean:        108.900 cm/s LVOT VTI:          0.308 m LVOT/AV VTI ratio: 0.65 TRICUSPID VALVE TR Peak grad:   28.5 mmHg TR Vmax:        267.00 cm/s  SHUNTS Systemic VTI:  0.31 m Systemic Diam: 2.30 cm Riley LamMahesh Chandrasekhar MD Electronically signed by Riley LamMahesh Chandrasekhar MD Signature Date/Time: 11/17/2021/4:12:20 PM    Final     Cardiac Studies   Echo 11/17/2021  1. Left ventricular ejection fraction, by estimation, is 60 to 65%. The  left ventricle has normal function. The left ventricle has no regional  wall motion abnormalities. There is moderate concentric left ventricular  hypertrophy.   2. Right ventricular systolic function is normal. The  right ventricular  size is normal. There is normal pulmonary artery systolic pressure. The  estimated right ventricular systolic pressure is 31.5 mmHg.   3. Mild mitral valve regurgitation.   4. The aortic valve was not well visualized. There is mild calcification  of the aortic valve. There is mild thickening of the aortic valve. Mild  aortic valve stenosis. Aortic valve mean gradient measures 13.0 mmHg.   5. The inferior vena cava is normal in size with greater than 50%  respiratory variability, suggesting right atrial pressure of 3 mmHg.   Patient Profile     80 y.o. male with PMH of permanent atrial fibrillation on Eliquis, COPD on home O2, asbestos lung disease, and nephrolithiasis presented with syncope after discharged home recently from COVID PNA. K was initially low at 2.8. O2 sat was in the 80s even on 3L O2.   Assessment & Plan    Syncope  - HR went from 90 to 142 when changed from lying to sitting, unable to stand due to dizziness. BP did not change much, but HR jumped quite significantly. Syncope was felt to be orthostatic, likely due to torsemide use in setting of COVID  infection - mildly elevated d-dimer in the setting of recent COVID, but negative CTA for PE.   - no bradycardia or prolonged pauses were observed on telemetry last night.   - Echo 11/17/2021 showed EF 60-65%, no RWMA, RVSP 31.5 mmHg, mild MR, mild AS.   - 2 week Zio AT (live monitor) upon discharge. No further workup planned during this admission.   Recent COVID 19  Permanent atrial fibrillation  COPD/asbestos lung disease   [facility] will sign off.   Medication Recommendations: Would hold torsemide on discharge.  Recommend monitoring daily weights and can take as needed if gaining more than 3 pounds in 1 day or 5 pounds in 1 week Other recommendations (labs, testing, etc):  Zio patch x 2 weeks on discharge Follow up as an outpatient: Follows with cardiology at [facility]    For questions or updates,  please contact [place] Please consult [email or web address] for contact info under        Signed, Almyra Deforest, [place]  11/18/2021, 10:22 AM    Patient seen and examined.  Agree with above documentation.  On exam, patient is alert and oriented, irregular rhythm, normal rate, no murmurs, lungs CTAB, no LE edema or JVD.  Telemetry shows [name] with rate 70s to 90s.  Echocardiogram yesterday showed EF 60 to 65%, moderate LVH, normal RV function, mild MR, mild AS.  We will plan Zio patch x2 weeks on discharge.  Would continue to hold torsemide on discharge and take as needed if gains more than 3 pounds in 1 day or 5 pounds in 1 week.  He will follow-up with his cardiologist at [facility].  Donato Heinz, MD

## 2021-11-18 NOTE — Progress Notes (Addendum)
Received a call from bedside RN who is also charge nurse in the unit regarding the patient being wheezing and family requesting a dose of IV Lasix.  Went to see the patient at bedside.  Patient is in no acute distress.  Oxygen saturation 100% on his baseline 3 L nasal cannula.  No wheezing noted on lungs auscultation.  Has a semiproductive cough, likely remnant from COVID-19 viral infection.  Tessalon Perles and Combivent inhaler ordered.  We will hold off IV Lasix for now as recommended by cardiology due to recent syncope in the setting of orthostatic hypotension.

## 2021-11-18 NOTE — Progress Notes (Unsigned)
Enrolled for [employer] to mail a ZIO AT Live Telemetry monitor to patients address on file.  

## 2021-11-19 ENCOUNTER — Encounter: Payer: Self-pay | Admitting: Physician Assistant

## 2021-11-19 LAB — BASIC METABOLIC PANEL
Anion gap: 6 (ref 5–15)
BUN: 21 mg/dL (ref 8–23)
CO2: 34 mmol/L — ABNORMAL HIGH (ref 22–32)
Calcium: 9.5 mg/dL (ref 8.9–10.3)
Chloride: 97 mmol/L — ABNORMAL LOW (ref 98–111)
Creatinine, Ser: 0.9 mg/dL (ref 0.61–1.24)
GFR, Estimated: >60 mL/min (ref >60)
Glucose, Bld: 135 mg/dL — ABNORMAL HIGH (ref 70–99)
Potassium: 3.6 mmol/L (ref 3.5–5.1)
Sodium: 137 mmol/L (ref 135–145)

## 2021-11-19 LAB — GLUCOSE, CAPILLARY: Glucose-Capillary: 115 mg/dL — ABNORMAL HIGH (ref 70–99)

## 2021-11-19 MED ORDER — BENZONATATE 200 MG PO CAPS: 200.0000 mg | ORAL_CAPSULE | Freq: Three times a day (TID) | ORAL | 0 refills | Status: DC

## 2021-11-19 NOTE — Progress Notes (Signed)
Pt refused [facility] transportation.

## 2021-11-19 NOTE — TOC Transition Note (Signed)
Transition of Care [facility]) - CM/SW Discharge Note   Patient Details  Name: Maurice Reed MRN: [id number] Date of Birth: 20-Jul-1941  [facility] CM/SW Contact:  Ida Rogue, LCSW Phone Number: 11/19/2021, 12:00 PM   Clinical Narrative:   Spoke with wife, who says there are steps leading into the home and she feels unsafe helping patient up steps in his current condition.  She agrees to [employer] transport.  [facility] arranged. Nursing alerted of call to [facility].  No further needs identified.  TOC sign off.    Final next level of care: Home w Home Health Services Barriers to Discharge: No Barriers Identified   Patient Goals and CMS Choice     Choice offered to / list presented to : Spouse  Discharge Placement                       Discharge Plan and Services   Discharge Planning Services: CM Consult Post Acute Care Choice: Home Health                               Social Determinants of Health (SDOH) Interventions     Readmission Risk Interventions No flowsheet data found.

## 2021-11-19 NOTE — Care Management Important Message (Signed)
Important Message  Patient Details IM Letter placed in Patients room. Name: Maurice Reed MRN: [id number] Date of Birth: [birth year]   Medicare Important Message Given:  Yes     Caren Macadam 11/19/2021, 11:34 AM

## 2021-11-19 NOTE — Discharge Summary (Addendum)
Discharge Summary  Maurice Reed [id number] DOB: January 22, 1941  PCP: Inda Coke, PA  Admit date: 11/16/2021 Discharge date: 11/19/2021  Time spent: 35 minutes.  Recommendations for Outpatient Follow-up:  Follow-up with your cardiologist at [facility] in 1 week Follow-up with your primary care provider Take your medications as prescribed Continue PT OT with assistance and fall precautions. Do not drive or operate heavy machinery until cleared by cardiology.  Discharge Diagnoses:  Active Hospital Problems   Diagnosis Date Noted   Syncope 11/16/2021   Hypokalemia 11/16/2021   (HFpEF) heart failure with preserved ejection fraction ([place]) 11/16/2021   Chronic respiratory failure with hypoxia ([place]) 12/23/2019   ILD (interstitial lung disease) ([place]) 05/22/2018   Essential hypertension    OSA treated with BiPAP 06/17/2013   Atrial fibrillation ([place]) 06/17/2013    Resolved Hospital Problems  No resolved problems to display.    Discharge Condition: Stable  Diet recommendation: Resume previous diet.  Vitals:   11/19/21 1140 11/19/21 1308  BP: 129/76 (!) 106/47  Pulse: 71 93  Resp: 19 18  Temp: 98.4 F (36.9 C) 98.2 F (36.8 C)  SpO2: 100% 100%    History of present illness:  Maurice Reed is a 80 y.o. male with medical history significant for permanent atrial fibrillation on Eliquis, emphysema/ILD, chronic hypoxic respiratory failure on 3 L O2 at rest and 4-5 L with exertion, HFpEF (EF 55% by TTE 09/30/2021), history of PE on Eliquis, HTN, chronic pain syndrome, and OSA on BiPAP, recent COVID-19 viral infection with hospitalization, who presented to [facility] ED for evaluation of syncope witnessed by his wife on the same day of discharge from the hospital.  His wife reports that he lost consciousness for 3-4 minutes.  Associated with tremors in his lower extremities prior to losing consciousness, foaming at his mouth, incontinence of urine and stools.  Per his wife, he did not hit his  head.  He was brought into the ED for further evaluation.    He was admitted for syncope work-up.    2D echo on 11/17/2021 showed EF 60 to 65%, moderate LVH, normal RV function, mild MR, mild AS.  Positive for orthostatic hypotension.  Hypovolemic on exam with hypokalemia, received gentle IV fluid hydration NSKCL at 75 cc/h times x12 hours in the ED.    Seen by cardiology, no further work-up, plan for Zio patch x2 weeks on discharge.  Cardiology recommends to hold off torsemide on discharge and advises to take home torsemide as needed if gains more than 3 pounds in 1 day or 5 pounds in 1 week.  Patient will follow-up with his cardiologist at [facility] posthospitalization within a week.  Evaluated by PT recommendation for home health PT.  TOC consulted to assist with home health services arrangements.   11/19/2021: No acute events overnight.  Oxygen saturation 100% on baseline of 3 L nasal cannula.  Vital signs and labs are reviewed and are stable.  Updated his wife at bedside.  Hospital Course:  Principal Problem:   Syncope Active Problems:   OSA treated with BiPAP   Essential hypertension   Atrial fibrillation (HCC)   ILD (interstitial lung disease) (HCC)   Chronic respiratory failure with hypoxia (HCC)   Hypokalemia   (HFpEF) heart failure with preserved ejection fraction (HCC)  Syncope, suspected secondary to orthostatic hypotension, rule out other causes. Positive orthostatic hypotension EEG negative for seizure activity  Per his wife, he lost consciousness for 3-4 minutes.   2D echo obtained with results as  stated above. Seen by cardiology with recommendation as stated above. Continue PT with assistance and fall precautions Zio patch x2 weeks, arrangement per cardiology.   Orthostatic hypotension Positive orthostatic vital signs on 11/18/21 Continue TED hose Hold off diuretics for now, follow cardiology's guidance as stated above. Continue fall precautions.   Resolved post  repletion: Hypokalemia in the setting of diuretics Patient on torsemide prior to admission.   He presented with potassium 2.8> 4.4 post repletion. Home torsemide held, per cardiology.   Recent COVID-19 viral infection Prior to this admission patient has completed 5 doses of remdesivir, verified and documented by pharmacy. Respiratory status at his baseline, with oxygen saturation 100% on 3 L.  Chronic hypoxic respiratory failure/history of emphysema/ILD Currently at his baseline respiratory status with oxygen saturation 100% on 3 L. Resume home regimen Follow-up with your pulmonologist   Permanent atrial fibrillation Rate is controlled, continue home Toprol-XL 25 mg daily Continue Eliquis for primary CVA prevention   Neuropathic pain Continue home regimen. Patient on Lyrica prior to admission.   BPH Continue home regimen. Patient on Flomax prior to admission.   Chronic insomnia Continue home regimen. Patient is on temazepam prior to admission.   Morbid obesity BMS of 37 Recommend weight loss outpatient with healthy dieting and regular physical activity   OSA Resume home regimen       Code Status: DNR   Family Communication: Updated his wife in person in his room and his granddaughter who is also an ICU RN on 11/18/2021 via phone.  Consultants: Cardiology, signed off.   Procedures: 2D echo 11/17/2021   Antimicrobials: Completed 5 doses of antiviral IV remdesivir   DVT prophylaxis: Eliquis     Discharge Exam: BP (!) 106/47 (BP Location: Left Arm)    Pulse 93    Temp 98.2 F (36.8 C) (Oral)    Resp 18    Ht '5\' 11"'  (1.803 m)    Wt 115.3 kg    SpO2 100%    BMI 35.45 kg/m  General: 80 y.o. year-old male well developed well nourished in no acute distress.  Alert and oriented x3.  Very hard of hearing. Cardiovascular: Irregular rate and rhythm with no rubs or gallops.  No thyromegaly or JVD noted.   Respiratory: Clear to auscultation with no wheezes or rales.  Good inspiratory effort. Abdomen: Soft nontender nondistended with normal bowel sounds x4 quadrants. Musculoskeletal: Trace lower extremity edema bilaterally.   Skin: No ulcerative lesions noted or rashes.  Mild hyperpigmentation affecting lower extremities bilaterally. Psychiatry: Mood is appropriate for condition and setting  Discharge Instructions You were cared for by a hospitalist during your hospital stay. If you have any questions about your discharge medications or the care you received while you were in the hospital after you are discharged, you can call the unit and asked to speak with the hospitalist on call if the hospitalist that took care of you is not available. Once you are discharged, your primary care physician will handle any further medical issues. Please note that NO REFILLS for any discharge medications will be authorized once you are discharged, as it is imperative that you return to your primary care physician (or establish a relationship with a primary care physician if you do not have one) for your aftercare needs so that they can reassess your need for medications and monitor your lab values.   Allergies as of 11/19/2021       Reactions   Other Itching   Cats: watery eyes,  itching, sneezing        Medication List     STOP taking these medications    spironolactone 25 MG tablet Commonly known as: ALDACTONE   Torsemide 40 MG Tabs       TAKE these medications    apixaban 5 MG Tabs tablet Commonly known as: Eliquis Take 1 tablet (5 mg total) by mouth 2 (two) times daily.   Atropine Sulfate-NaCl 0.01-0.9 % Soln Place 1 drop into the left eye at bedtime.   benzonatate 200 MG capsule Commonly known as: TESSALON Take 1 capsule (200 mg total) by mouth 3 (three) times daily.   clobetasol 0.05 % external solution Commonly known as: TEMOVATE APPLY TO AFFECTED AREA(S)  TOPICALLY ON SCALP 1 TO 2  TIMES DAILY AS NEEDED What changed:  how much to take when  to take this reasons to take this additional instructions   desonide 0.05 % lotion Commonly known as: DESOWEN Apply 1 application topically daily as needed (itching).   dexamethasone 6 MG tablet Commonly known as: Decadron Take 1 tablet (6 mg total) by mouth daily for 6 days.   docusate sodium 100 MG capsule Commonly known as: COLACE Take 100 mg by mouth 2 (two) times daily.   doxycycline 100 MG tablet Commonly known as: VIBRA-TABS TAKE 1 TABLET BY MOUTH  TWICE DAILY   erythromycin ophthalmic ointment Place 1 application into both eyes at bedtime.   ipratropium-albuterol 0.5-2.5 (3) MG/3ML Soln Commonly known as: DUONEB Take 3 mLs by nebulization every 6 (six) hours as needed. What changed: reasons to take this   ketorolac 0.5 % ophthalmic solution Commonly known as: ACULAR Place 1 drop into both eyes 4 (four) times daily as needed (irritation).   Lumigan 0.01 % Soln Generic drug: bimatoprost Place 1 drop into the right eye at bedtime.   magnesium oxide 400 MG tablet Commonly known as: MAG-OX TAKE 1 TABLET BY MOUTH TWICE A DAY   metoprolol succinate 25 MG 24 hr tablet Commonly known as: TOPROL-XL TAKE 1 TABLET BY MOUTH  DAILY What changed: how to take this   mometasone 50 MCG/ACT nasal spray Commonly known as: NASONEX Place 2 sprays into the nose daily. What changed:  when to take this reasons to take this   Narcan 4 MG/0.1ML Liqd nasal spray kit Generic drug: naloxone 1 spray once as needed (overdose).   nystatin ointment Commonly known as: MYCOSTATIN Apply 1 application topically 2 (two) times daily. What changed:  when to take this reasons to take this   Otezla 30 MG Tabs Generic drug: Apremilast Take 30 mg by mouth 2 (two) times daily.   oxyCODONE 5 MG immediate release tablet Commonly known as: Oxy IR/ROXICODONE Take 5 mg by mouth 3 (three) times daily as needed for severe pain. for pain   prednisoLONE acetate 1 % ophthalmic  suspension Commonly known as: PRED FORTE Place 1 drop into the left eye at bedtime.   pregabalin 50 MG capsule Commonly known as: LYRICA Take 50 mg by mouth 2 (two) times daily.   tadalafil 20 MG tablet Commonly known as: CIALIS Take 0.5-1 tablets (10-20 mg total) by mouth every other day as needed for erectile dysfunction.   tamsulosin 0.4 MG Caps capsule Commonly known as: FLOMAX TAKE 1 CAPSULE BY MOUTH AT  BEDTIME What changed: how to take this   temazepam 15 MG capsule Commonly known as: RESTORIL TAKE 1 CAPSULE (15 MG TOTAL) BY MOUTH AT BEDTIME AS NEEDED FOR SLEEP. What changed:  how  much to take how to take this when to take this   TRIAMCINOLONE PO Apply 1 application topically daily as needed (itching).   Vitamin C 500 MG Caps Take 500 mg by mouth daily.   Vitamin D3 75 MCG (3000 UT) Tabs Take 3,000 Units by mouth daily.   zinc gluconate 50 MG tablet Take 50 mg by mouth daily.       Allergies  Allergen Reactions   Other Itching    Cats: watery eyes, itching, sneezing    Follow-up Information     [facility] Follow up.   Specialty: Home Health Services Why: They will contact you about setting up a time to come out. Contact information: [address] [place] [address] [phone]         Inda Coke, PA. Call today.   Specialty: Physician Assistant Why: Please call for a posthospital follow-up appointment. Contact information: [place] [address] [phone]         Lorretta Harp, MD .   Specialties: Cardiology, Radiology Contact information: [address] [place] [address] [phone]                  The results of significant diagnostics from this hospitalization (including imaging, microbiology, ancillary and laboratory) are listed below for reference.    Significant Diagnostic Studies: CT Angio Chest PE W and/or Wo Contrast  Result Date:  11/16/2021 CLINICAL DATA:  COVID positive.  Pulmonary embolism suspected. EXAM: CT ANGIOGRAPHY CHEST WITH CONTRAST TECHNIQUE: Multidetector CT imaging of the chest was performed using the standard protocol during bolus administration of intravenous contrast. Multiplanar CT image reconstructions and MIPs were obtained to evaluate the vascular anatomy. CONTRAST:  177m OMNIPAQUE IOHEXOL 350 MG/ML SOLN COMPARISON:  12/17/2019 FINDINGS: Cardiovascular: Heart is enlarged. No substantial pericardial effusion. Coronary artery calcification is evident. Moderate atherosclerotic calcification is noted in the wall of the thoracic aorta. There is no filling defect within the opacified pulmonary arteries to suggest the presence of an acute pulmonary embolus. Mediastinum/Nodes: No mediastinal lymphadenopathy. There is no hilar lymphadenopathy. The esophagus has normal imaging features. There is no axillary lymphadenopathy. Lungs/Pleura: Mosaic ground-glass attenuation in both lungs is nonspecific but likely reflects air trapping. Calcified pleural plaques bilaterally are consistent with prior asbestos exposure. Architectural distortion and scarring noted in the peripheral lungs bilaterally. Upper Abdomen: Unremarkable. Musculoskeletal: No worrisome lytic or sclerotic osseous abnormality. Review of the MIP images confirms the above findings. IMPRESSION: 1. No CT evidence for acute pulmonary embolus. 2. Mosaic ground-glass attenuation in both lungs is nonspecific but likely reflects air trapping. 3. Calcified pleural plaques bilaterally consistent with prior asbestos exposure. Peripheral architectural distortion/scarring is compatible with asbestos related lung disease. 4. Aortic Atherosclerosis (ICD10-I70.0). Electronically Signed   By: EMisty StanleyM.D.   On: 11/16/2021 17:09   DG Chest Port 1 View  Result Date: 11/16/2021 CLINICAL DATA:  Shortness of breath EXAM: PORTABLE CHEST 1 VIEW COMPARISON:  11/12/2021 FINDINGS:  Cardiomegaly, vascular congestion. Bilateral interstitial and airspace opacities, which could reflect edema or infection superimposed on chronic lung disease. Extensive calcified pleural plaques on the left and right lung base. Fall no visible effusions. No acute bony abnormality. IMPRESSION: Cardiomegaly with vascular congestion. Bilateral interstitial/alveolar opacities could reflect edema or infection. Electronically Signed   By: KRolm BaptiseM.D.   On: 11/16/2021 17:09   DG Chest Portable 1 View  Result Date: 11/12/2021 CLINICAL DATA:  Shortness of breath.  COVID positive. EXAM: PORTABLE CHEST 1 VIEW COMPARISON:  Chest radiograph and CTA 12/17/2019 FINDINGS: The cardiac silhouette remains enlarged. Aortic atherosclerosis is noted. Extensive bilateral pleural calcification and pleural thickening are again seen. Coarse bilateral interstitial densities have increased from the prior study, and there are also scattered new patchy opacities bilaterally. No large pleural effusion or pneumothorax is identified. IMPRESSION: Increased bilateral lung opacities which may reflect pneumonia superimposed on chronic lung disease. Electronically Signed   By: Logan Bores M.D.   On: 11/12/2021 20:25   EEG adult  Result Date: 11/17/2021 Lora Havens, MD     11/17/2021  4:12 PM Patient Name: Maurice Reed MRN: [id number] Epilepsy Attending: Lora Havens Referring Physician/Provider: Dr Irene Pap Date: 11/17/2021 Duration: 22.42 mins Patient history: 80 year old male with syncope, loss of consciousness for 3 to 4 minutes associated with tremors of lower extremities and foaming at as well as incontinent of urine and stools.  EEG to evaluate for procedure. Level of alertness: Awake, drowsy AEDs during EEG study: None Technical aspects: This EEG study was done with scalp electrodes positioned according to the 10-20 International system of electrode placement. Electrical activity was acquired at a sampling rate of  '500Hz'  and reviewed with a high frequency filter of '70Hz'  and a low frequency filter of '1Hz' . EEG data were recorded continuously and digitally stored. Description: The posterior dominant rhythm consists of 9 Hz activity of moderate voltage (25-35 uV) seen predominantly in posterior head regions, symmetric and reactive to eye opening and eye closing. Drowsiness was characterized by attenuation of the posterior background rhythm. Hyperventilation and photic stimulation were not performed.   IMPRESSION: This study is within normal limits. No seizures or epileptiform discharges were seen throughout the recording. Lora Havens   ECHOCARDIOGRAM LIMITED  Result Date: 11/17/2021    ECHOCARDIOGRAM LIMITED REPORT   Patient Name:   Maurice Reed Date of Exam: 11/17/2021 Medical Rec #:  [id number]    Height:       71.0 in Accession #:    [id number]   Weight:       268.5 lb Date of Birth:  March 14, 1941    BSA:          2.390 m Patient Age:    46 years     BP:           122/80 mmHg Patient Gender: M            HR:           79 bpm. Exam Location:  Inpatient Procedure: Limited Echo, Color Doppler and Cardiac Doppler Indications:    R55 Syncope  History:        Patient has prior history of Echocardiogram examinations, most                 recent 04/27/2020. Pulmonary HTN, Arrythmias:Atrial Fibrillation;                 Risk Factors:Hypertension, Dyslipidemia and Sleep Apnea.  Sonographer:    Raquel Sarna Senior RDCS Referring Phys: [id number] VISHAL R PATEL  Sonographer Comments: COVID+ at time of study IMPRESSIONS  1. Left ventricular ejection fraction, by estimation, is 60 to 65%. The left ventricle has normal function. The left ventricle has no regional wall motion abnormalities. There is moderate concentric left ventricular hypertrophy.  2. Right ventricular systolic function is normal. The right ventricular size is normal. There is normal pulmonary artery systolic pressure. The estimated right ventricular systolic pressure is 31.5  mmHg.  3. Mild mitral valve regurgitation.  4. The aortic valve was not well visualized. There is mild calcification of the aortic valve. There is mild thickening of the aortic valve. Mild aortic valve stenosis. Aortic valve mean gradient measures 13.0 mmHg.  5. The inferior vena cava is normal in size with greater than 50% respiratory variability, suggesting right atrial pressure of 3 mmHg. FINDINGS  Left Ventricle: Left ventricular ejection fraction, by estimation, is 60 to 65%. The left ventricle has normal function. The left ventricle has no regional wall motion abnormalities. There is moderate concentric left ventricular hypertrophy. Right Ventricle: The right ventricular size is normal. No increase in right ventricular wall thickness. Right ventricular systolic function is normal. There is normal pulmonary artery systolic pressure. The tricuspid regurgitant velocity is 2.67 m/s, and  with an assumed right atrial pressure of 3 mmHg, the estimated right ventricular systolic pressure is 13.2 mmHg. Pericardium: Presence of epicardial fat layer. Mitral Valve: Mild mitral annular calcification. Mild mitral valve regurgitation. Tricuspid Valve: The tricuspid valve is normal in structure. Tricuspid valve regurgitation is mild . No evidence of tricuspid stenosis. Aortic Valve: The aortic valve was not well visualized. There is mild calcification of the aortic valve. There is mild thickening of the aortic valve. There is mild aortic valve annular calcification. Mild aortic stenosis is present. Aortic valve mean gradient measures 13.0 mmHg. Aortic valve peak gradient measures 21.3 mmHg. Aortic valve area, by VTI measures 2.72 cm. Pulmonic Valve: The pulmonic valve was not well visualized. Pulmonic valve regurgitation is not visualized. No evidence of pulmonic stenosis. Aorta: The aortic root is normal in size and structure. Venous: The inferior vena cava is normal in size with greater than 50% respiratory variability,  suggesting right atrial pressure of 3 mmHg. LEFT VENTRICLE PLAX 2D LVOT diam:     2.30 cm LV SV:         128 LV SV Index:   54 LVOT Area:     4.15 cm  AORTIC VALVE AV Area (Vmax):    2.67 cm AV Area (Vmean):   2.62 cm AV Area (VTI):     2.72 cm AV Vmax:           231.00 cm/s AV Vmean:          173.000 cm/s AV VTI:            0.471 m AV Peak Grad:      21.3 mmHg AV Mean Grad:      13.0 mmHg LVOT Vmax:         148.50 cm/s LVOT Vmean:        108.900 cm/s LVOT VTI:          0.308 m LVOT/AV VTI ratio: 0.65 TRICUSPID VALVE TR Peak grad:   28.5 mmHg TR Vmax:        267.00 cm/s  SHUNTS Systemic VTI:  0.31 m Systemic Diam: 2.30 cm Rudean Haskell MD Electronically signed by Rudean Haskell MD Signature Date/Time: 11/17/2021/4:12:20 PM    Final     Microbiology: Recent Results (from the past 240 hour(s))  Resp Panel by RT-PCR (Flu A&B, Covid) Nasopharyngeal Swab     Status: Abnormal   Collection Time: 11/12/21  6:51 PM   Specimen: Nasopharyngeal Swab; Nasopharyngeal(NP) swabs in vial transport medium  Result Value Ref Range Status   SARS Coronavirus 2 by RT PCR POSITIVE (A) NEGATIVE Final    Comment: RESULT CALLED TO, READ BACK BY AND VERIFIED WITH: jaimie oneal, rn  11/12/21 1937 src (NOTE) SARS-CoV-2 target nucleic acids are DETECTED.  The SARS-CoV-2 RNA is generally detectable in upper respiratory specimens during the acute phase of infection. Positive results are indicative of the presence of the identified virus, but do not rule out bacterial infection or co-infection with other pathogens not detected by the test. Clinical correlation with patient history and other diagnostic information is necessary to determine patient infection status. The expected result is Negative.  Fact Sheet for Patients: EntrepreneurPulse.com.au  Fact Sheet for Healthcare Providers: IncredibleEmployment.be  This test is not yet approved or cleared by the [place] FDA  and  has been authorized for detection and/or diagnosis of SARS-CoV-2 by FDA under an Emergency Use Authorization (EUA).  This EUA will remain in effect (meaning this test can be  used) for the duration of  the COVID-19 declaration under Section 564(b)(1) of the Act, 21 U.S.C. section 360bbb-3(b)(1), unless the authorization is terminated or revoked sooner.     Influenza A by PCR NEGATIVE NEGATIVE Final   Influenza B by PCR NEGATIVE NEGATIVE Final    Comment: (NOTE) The Xpert Xpress SARS-CoV-2/FLU/RSV plus assay is intended as an aid in the diagnosis of influenza from Nasopharyngeal swab specimens and should not be used as a sole basis for treatment. Nasal washings and aspirates are unacceptable for Xpert Xpress SARS-CoV-2/FLU/RSV testing.  Fact Sheet for Patients: EntrepreneurPulse.com.au  Fact Sheet for Healthcare Providers: IncredibleEmployment.be  This test is not yet approved or cleared by the [place] FDA and has been authorized for detection and/or diagnosis of SARS-CoV-2 by FDA under an Emergency Use Authorization (EUA). This EUA will remain in effect (meaning this test can be used) for the duration of the COVID-19 declaration under Section 564(b)(1) of the Act, 21 U.S.C. section 360bbb-3(b)(1), unless the authorization is terminated or revoked.  Performed at [facility], [address], [place], [place] [address]   Blood Culture (routine x 2)     Status: None   Collection Time: 11/13/21  2:12 PM   Specimen: BLOOD  Result Value Ref Range Status   Specimen Description   Final    BLOOD LEFT ANTECUBITAL Performed at [facility] [address], [place], [place] [address]    Special Requests   Final    BOTTLES DRAWN AEROBIC ONLY Blood Culture adequate volume Performed at [facility] [address] [place], [place] [address]    Culture   Final    NO GROWTH 5  DAYS Performed at [facility], [place] [address], [place], [place] [address]    Report Status 11/18/2021 FINAL  Final  Blood Culture (routine x 2)     Status: None   Collection Time: 11/13/21  2:12 PM   Specimen: BLOOD  Result Value Ref Range Status   Specimen Description   Final    BLOOD RIGHT ANTECUBITAL Performed at [facility] [address], [place], [place] [address]    Special Requests   Final    BOTTLES DRAWN AEROBIC ONLY Blood Culture adequate volume Performed at [facility] [address] [place], [place] [address]    Culture   Final    NO GROWTH 5 DAYS Performed at [facility], [place] [address], [place], [place] [address]    Report Status 11/18/2021 FINAL  Final  Gastrointestinal Panel by PCR , Stool     Status: None   Collection Time: 11/13/21  7:29 PM   Specimen: Stool  Result Value Ref Range Status   Campylobacter species NOT DETECTED NOT DETECTED Final   Plesimonas shigelloides NOT DETECTED NOT DETECTED Final   Salmonella species NOT DETECTED NOT DETECTED Final   Yersinia enterocolitica NOT DETECTED NOT DETECTED Final   Vibrio species NOT DETECTED NOT DETECTED Final   Vibrio cholerae NOT DETECTED NOT DETECTED Final   Enteroaggregative E coli (EAEC) NOT DETECTED NOT DETECTED Final   Enteropathogenic E coli (EPEC) NOT DETECTED NOT DETECTED Final   Enterotoxigenic E coli (ETEC) NOT DETECTED NOT DETECTED Final   Shiga like toxin producing E coli (STEC) NOT DETECTED NOT DETECTED Final   Shigella/Enteroinvasive E coli (EIEC) NOT DETECTED NOT DETECTED Final   Cryptosporidium NOT DETECTED NOT DETECTED Final   Cyclospora cayetanensis NOT DETECTED NOT DETECTED Final   Entamoeba histolytica NOT DETECTED NOT DETECTED Final   Giardia lamblia NOT DETECTED NOT DETECTED Final   Adenovirus F40/41 NOT DETECTED NOT DETECTED Final   Astrovirus NOT DETECTED NOT DETECTED Final   Norovirus GI/GII NOT DETECTED NOT DETECTED Final    Rotavirus A NOT DETECTED NOT DETECTED Final   Sapovirus (I, II, IV, and V) NOT DETECTED NOT DETECTED Final    Comment: Performed at [facility], [address] [place], [place] [address]  C Difficile Quick Screen w PCR reflex     Status: None   Collection Time: 11/13/21  7:29 PM   Specimen: STOOL  Result Value Ref Range Status   C Diff antigen NEGATIVE NEGATIVE Final   C Diff toxin NEGATIVE NEGATIVE Final   C Diff interpretation NEGATIVE  Final    Comment: Performed at [facility], [place] [address], [place], [place] [address]     Labs: Basic Metabolic Panel: Recent Labs  Lab 11/15/21 0347 11/16/21 0402 11/16/21 1623 11/16/21 1645 11/17/21 0440 11/19/21 1350  NA 139 137 141 140 138 137  K 3.3* 3.7 2.8* 3.0* 4.4 3.6  CL 96* 92* 96* 93* 96* 97*  CO2 35* 33* 34*  --  33* 34*  GLUCOSE 139* 151* 95 98 133* 135*  BUN 20 25* 24* 24* 23 21  CREATININE 0.94 0.93 0.91 1.00 0.96 0.90  CALCIUM 8.8* 8.9 8.0*  --  8.5* 9.5  MG  --  1.5* 1.8  --  2.0  --   PHOS  --  3.8  --   --  3.3  --    Liver Function Tests: Recent Labs  Lab 11/13/21 0736 11/14/21 0405 11/15/21 0347 11/16/21 0402 11/16/21 1623  AST '18 22 20 23 29  ' ALT '11 15 14 15 19  ' ALKPHOS 82 89 80 83 81  BILITOT 0.3 0.5 0.6 0.5 0.5  PROT 6.4* 6.9 6.8 6.9 6.7  ALBUMIN 3.4* 3.4* 3.4* 3.4* 3.2*   No results for input(s): LIPASE, AMYLASE in the last 168 hours. No results for input(s): AMMONIA in the last 168 hours. CBC: Recent Labs  Lab 11/13/21 0736 11/14/21 0405 11/15/21 0347 11/16/21 0402 11/16/21 1623 11/16/21 1645 11/17/21 0440  WBC 4.9 3.0* 3.7* 4.3 9.2  --  8.4  NEUTROABS 2.7 2.0 2.3 3.0 6.9  --   --   HGB 11.8* 13.8 13.1 14.3 14.7 15.0 14.3  HCT 36.2* 40.9 39.0 42.5 44.4 44.0 43.0  MCV 105.8* 102.5* 101.6* 100.2* 102.3*  --  102.1*  PLT 170 133* 211 211 229  --  225   Cardiac Enzymes: No results for input(s): CKTOTAL, CKMB, CKMBINDEX, TROPONINI in the last 168  hours. BNP: BNP (last  3 results) Recent Labs    11/12/21 1931  BNP 120.9*    ProBNP (last 3 results) No results for input(s): PROBNP in the last 8760 hours.  CBG: Recent Labs  Lab 11/17/21 0549 11/18/21 0748 11/19/21 0525  GLUCAP 127* 95 115*       Signed:  Kayleen Memos, MD [facility] 11/19/2021, 4:42 PM

## 2021-11-21 DIAGNOSIS — R55 Syncope and collapse: Secondary | ICD-10-CM

## 2021-11-24 ENCOUNTER — Telehealth: Payer: Self-pay | Admitting: *Deleted

## 2021-11-24 ENCOUNTER — Encounter: Payer: Self-pay | Admitting: *Deleted

## 2021-11-24 NOTE — Telephone Encounter (Signed)
[employer] called to inform us patients ZIO AT monitor is not transmitting to their facility. There is poor cellular reception in their home.  Monitor would transmit if patient travelled to area of good cellular transmission intermittently, however, wife states, patient was recently discharged from hospital and not able travel even short distances. There are no other options for Live transmission of data.  ZIO AT monitor will continue to record, however, data will not be transmitted to [employer] until monitor has travelled to area of good cellular transmission or has been mailed back to [facility] for processing.

## 2021-11-24 NOTE — Progress Notes (Signed)
Patient ID: Maurice Reed, male   DOB: 17-Nov-1941, 80 y.o.   MRN: [id number] Email from [facility] regarding patient ZIO AT monitor. Hello,     This email is to notify you the patient listed below had a reaction to the [facility] monitor and is sending the patch back early. If you have any questions, please respond to this email, or call us at [phone].     Patient initialsLulu Riding:  [name]  Ticket: [id number]     Thank you,  [employer] Customer Care

## 2021-11-25 ENCOUNTER — Telehealth (HOSPITAL_BASED_OUTPATIENT_CLINIC_OR_DEPARTMENT_OTHER): Payer: Self-pay

## 2021-11-25 NOTE — Telephone Encounter (Signed)
° °  Cardiac Monitor Alert  Date of alert:  11/25/2021   Patient Name: Maurice Reed  DOB: 1940-12-18  MRN: [id number]   [facility] Cardiologist: [name], MD  [facility] EP:  None    Monitor Information: Long Term Monitor-Live Telemetry [ZioAT]  Reason:  Symptomatic 1st documentation of [name] Ordering provider:  Azalee Course, PA   Alert Atrial Fibrillation/Flutter This is the 1st alert for this rhythm.  The patient has a hx of Atrial Fibrillation/Flutter.  The patient is not currently on anticoagulation. Anticoagulation medication as of 11/25/2021           apixaban (ELIQUIS) 5 MG TABS tablet Take 1 tablet (5 mg total) by mouth 2 (two) times daily.       Next Cardiology Appointment   Date: Not Scheduled   Yes, patient states he is always in [name] this is his normal rhythm    Consulted with DOD Cristal Deer, since patient is always in [name] w/ no symptoms and not an onset of new [name], no treatment is required. Pt. Is already taking Eliquis.   Other: Rhythm strip to be scanned into Epic once received from the [employer].   Marlene Lard, RN  11/25/2021 2:35 PM

## 2021-12-10 ENCOUNTER — Encounter: Payer: Self-pay | Admitting: Physician Assistant

## 2021-12-10 ENCOUNTER — Other Ambulatory Visit: Payer: Self-pay

## 2021-12-10 ENCOUNTER — Ambulatory Visit (INDEPENDENT_AMBULATORY_CARE_PROVIDER_SITE_OTHER): Payer: Medicare Other | Admitting: Physician Assistant

## 2021-12-10 VITALS — BP 100/62 | HR 80 | Temp 98.3°F | Ht 71.0 in | Wt 264.4 lb

## 2021-12-10 DIAGNOSIS — R2231 Localized swelling, mass and lump, right upper limb: Secondary | ICD-10-CM

## 2021-12-10 DIAGNOSIS — J9611 Chronic respiratory failure with hypoxia: Secondary | ICD-10-CM

## 2021-12-10 DIAGNOSIS — R5381 Other malaise: Secondary | ICD-10-CM

## 2021-12-10 DIAGNOSIS — E876 Hypokalemia: Secondary | ICD-10-CM

## 2021-12-10 LAB — BASIC METABOLIC PANEL
BUN: 14 mg/dL (ref 6–23)
CO2: 38 mEq/L — ABNORMAL HIGH (ref 19–32)
Calcium: 9.1 mg/dL (ref 8.4–10.5)
Chloride: 95 mEq/L — ABNORMAL LOW (ref 96–112)
Creatinine, Ser: 0.9 mg/dL (ref 0.40–1.50)
GFR: 80.49 mL/min (ref >60.00)
Glucose, Bld: 92 mg/dL (ref 70–99)
Potassium: 3.6 mEq/L (ref 3.5–5.1)
Sodium: 141 mEq/L (ref 135–145)

## 2021-12-10 LAB — MAGNESIUM: Magnesium: 1.9 mg/dL (ref 1.5–2.5)

## 2021-12-10 NOTE — Patient Instructions (Signed)
It was great to see you!  I have put in an order for home health PT.  We will update your blood work today  Let's follow-up in 3 months, sooner if you have concerns.  If a referral was placed today, you will be contacted for an appointment. Please note that routine referrals can sometimes take up to 3-4 weeks to process. Please call our office if you haven't heard anything after this time frame.  Take care,  Jarold Motto PA-C

## 2021-12-10 NOTE — Progress Notes (Signed)
Maurice Reed is a 81 y.o. male here for a f/u of Covid 19 pneumonia.  History of Present Illness:   Chief Complaint  Patient presents with   Hospitalization Follow-up    HPI  Post Covid 19 Pneumonia On 11/12/21, Maurice Reed saw me via video visit with c/o increasingly worsening cough and SOB that had been onset for 3 days. During this visit, pt's wife, Maurice Reed, admitted that she had recently tested for COVID but only experienced diarrhea since dx. According to her, Kailen had experienced diarrhea just like she did prior to cough and SOB occurring. Due to pt reporting that O2 was dropping into low 80's despite increasing oxygen from 3.5 liters to 5 liters, I recommended he visit the ER immediately.   Following our visit, Maurice Reed presented to the ER on 12/09.22 with COVID. Chest x -ray was ordered and showed increased bilateral lung opacities which may reflect pneumonia superimposed on chronic lung disease.   Patient was admitted for possible community-acquired pneumonia and started on remdesivir, Rocephin and Zithromax.  Maurice Reed was continued on oxygen and weaned down successfully to his baseline oxygen requirement. After further evaluation, he was discharged home on Decadron 6 mg daily for 6 more days to complete 10-day treatment.   Currently Maurice Reed is still at baseline for oxygen use. States that his at home use is a level 3 and when in the shower or using a portable source it is a 5. Although he is managing well, he would like to be transferred to [place] for further treatment.   Potassium and magnesium were quite low during admission, wife would like them rechecked today.  Syncope Unfortunately, shortly after being discharged pt experienced an episode of syncope on 11/16/21 and presented to [facility] ED for evaluation. According to Maurice Reed, his wife, he lost consciousness for 3-4 minutes but was still breathing normally. Associated sx were tremors in his lower extremities prior to losing  consciousness, foaming at his mouth, incontinence of urine and stools.  Per his wife, he did not hit his head. A 2D echo was completed on 11/17/2021 and showed EF 60 to 65%, moderate LVH, normal RV function, mild MR, mild AS.  Positive for orthostatic hypotension.  Hypovolemic on exam with hypokalemia, received gentle IV fluid hydration NSKCL at 75 cc/h times x12 hours in the ED.     As recommended by cardiology following discharge, Maurice Reed has completed wearing a Zio patch x2 weeks. Initially he also held off taking torsemide 40 mg daily as recommended but this didn't last long due to buildup of fluid.  Currently he is weighing daily and taking torsemide 40 mg daily due to daily weight. According to Maurice Reed, they have not received the results from the [facility] monitor but will be following up with cardiology later this month.   Nodule on Right Hand- Second Finger  Maurice Reed expresses slight concern due to a nodule on pt's second finger on his right hand. States that it has been there for months, but she has noticed it has gotten bigger. At this time they are interested in following up about this issue with dermatology, but wanted reassurance that nothing bigger could be causing this. Denies any pain.   Past Medical History:  Diagnosis Date   Arthritis    Atrial fibrillation ([place])    Blind left eye 2005   was a result of cataract surgery   Diverticula, bladder    Erectile dysfunction    GERD (gastroesophageal reflux disease)  Kidney stones    Obesity    OSA (obstructive sleep apnea)    Pulmonary embolism ([facility])      Social History   Tobacco Use   Smoking status: Former    Packs/day: 3.00    Years: 25.00    Pack years: 75.00    Types: Cigarettes    Quit date: 11/28/1984    Years since quitting: 37.0   Smokeless tobacco: Never  Vaping Use   Vaping Use: Never used  Substance Use Topics   Alcohol use: Yes    Alcohol/week: 1.0 standard drink    Types: 1 Glasses of wine per week     Comment: at times   Drug use: Not Currently    Types: Marijuana    Comment: Gummies     Past Surgical History:  Procedure Laterality Date   APPENDECTOMY  1963   LITHOTRIPSY  1987   NOSE SURGERY  2003   TONSILLECTOMY AND ADENOIDECTOMY  9937   UMBILICAL HERNIA REPAIR  1984    Family History  Problem Relation Age of Onset   Arthritis Mother    Hearing loss Mother    Hypertension Mother    Heart disease Mother    Stroke Mother    Arthritis Father    Hearing loss Father    Heart disease Father    Hypertension Father    Heart attack Father    Kidney disease Father    Stroke Father     Allergies  Allergen Reactions   Other Itching    Cats: watery eyes, itching, sneezing    Current Medications:   Current Outpatient Medications:    apixaban (ELIQUIS) 5 MG TABS tablet, Take 1 tablet (5 mg total) by mouth 2 (two) times daily., Disp: 180 tablet, Rfl: 1   Apremilast (OTEZLA) 30 MG TABS, Take 30 mg by mouth 2 (two) times daily., Disp: , Rfl:    Ascorbic Acid (VITAMIN C) 500 MG CAPS, Take 500 mg by mouth daily., Disp: , Rfl:    Atropine Sulfate-NaCl 0.01-0.9 % SOLN, Place 1 drop into the left eye at bedtime., Disp: , Rfl:    bimatoprost (LUMIGAN) 0.01 % SOLN, Place 1 drop into the right eye at bedtime., Disp: , Rfl:    Cholecalciferol (VITAMIN D3) 3000 units TABS, Take 3,000 Units by mouth daily., Disp: , Rfl:    clobetasol (TEMOVATE) 0.05 % external solution, APPLY TO AFFECTED AREA(S)  TOPICALLY ON SCALP 1 TO 2  TIMES DAILY AS NEEDED (Patient taking differently: 1 application 2 (two) times daily as needed (for scalp).), Disp: 150 mL, Rfl: 3   desonide (DESOWEN) 0.05 % lotion, Apply 1 application topically daily as needed (itching)., Disp: , Rfl:    docusate sodium (COLACE) 100 MG capsule, Take 100 mg by mouth 2 (two) times daily., Disp: , Rfl:    doxycycline (VIBRA-TABS) 100 MG tablet, TAKE 1 TABLET BY MOUTH  TWICE DAILY (Patient taking differently: Take 100 mg by mouth 2 (two)  times daily.), Disp: 180 tablet, Rfl: 1   erythromycin ophthalmic ointment, Place 1 application into both eyes at bedtime., Disp: , Rfl: 1   ipratropium-albuterol (DUONEB) 0.5-2.5 (3) MG/3ML SOLN, Take 3 mLs by nebulization every 6 (six) hours as needed. (Patient taking differently: Take 3 mLs by nebulization every 6 (six) hours as needed (wheezing).), Disp: 360 mL, Rfl: 3   ketorolac (ACULAR) 0.5 % ophthalmic solution, Place 1 drop into both eyes 4 (four) times daily as needed (irritation)., Disp: , Rfl:  magnesium oxide (MAG-OX) 400 MG tablet, TAKE 1 TABLET BY MOUTH TWICE A DAY (Patient taking differently: Take 400 mg by mouth 2 (two) times daily.), Disp: 180 tablet, Rfl: 2   metoprolol succinate (TOPROL-XL) 25 MG 24 hr tablet, TAKE 1 TABLET BY MOUTH  DAILY (Patient taking differently: 25 mg daily.), Disp: 90 tablet, Rfl: 3   mometasone (NASONEX) 50 MCG/ACT nasal spray, Place 2 sprays into the nose daily. (Patient taking differently: Place 2 sprays into the nose daily as needed (congestion).), Disp: 51 g, Rfl: 2   NARCAN 4 MG/0.1ML LIQD nasal spray kit, 1 spray once as needed (overdose)., Disp: , Rfl: 0   nystatin ointment (MYCOSTATIN), Apply 1 application topically 2 (two) times daily. (Patient taking differently: Apply 1 application topically daily as needed (skinn irritation).), Disp: 30 g, Rfl: 1   oxyCODONE (OXY IR/ROXICODONE) 5 MG immediate release tablet, Take 5 mg by mouth 3 (three) times daily as needed for severe pain. for pain, Disp: , Rfl: 0   potassium chloride (KLOR-CON) 10 MEQ tablet, Take 10 mEq by mouth daily., Disp: , Rfl:    prednisoLONE acetate (PRED FORTE) 1 % ophthalmic suspension, Place 1 drop into the left eye at bedtime., Disp: , Rfl:    pregabalin (LYRICA) 50 MG capsule, Take 50 mg by mouth 2 (two) times daily., Disp: , Rfl:    tadalafil (CIALIS) 20 MG tablet, Take 0.5-1 tablets (10-20 mg total) by mouth every other day as needed for erectile dysfunction., Disp: 5 tablet,  Rfl: 11   tamsulosin (FLOMAX) 0.4 MG CAPS capsule, TAKE 1 CAPSULE BY MOUTH AT  BEDTIME (Patient taking differently: 0.4 mg at bedtime.), Disp: 90 capsule, Rfl: 3   temazepam (RESTORIL) 15 MG capsule, TAKE 1 CAPSULE (15 MG TOTAL) BY MOUTH AT BEDTIME AS NEEDED FOR SLEEP. (Patient taking differently: Take 15 mg by mouth at bedtime. TAKE 1 CAPSULE (15 MG TOTAL) BY MOUTH AT BEDTIME AS NEEDED FOR SLEEP.), Disp: 90 capsule, Rfl: 1   torsemide (DEMADEX) 20 MG tablet, Take 40 mg by mouth daily. If weight is more than 3 pounds take another 10 mg for every 3 pound gain., Disp: , Rfl:    TRIAMCINOLONE PO, Apply 1 application topically daily as needed (itching)., Disp: , Rfl:    zinc gluconate 50 MG tablet, Take 50 mg by mouth daily., Disp: , Rfl:    Review of Systems:   ROS Negative unless otherwise specified per HPI. Vitals:   Vitals:   12/10/21 1401  BP: 100/62  Pulse: 80  Temp: 98.3 F (36.8 C)  TempSrc: Temporal  SpO2: 97%  Weight: 264 lb 6.1 oz (119.9 kg)  Height: '5\' 11"'  (1.803 m)     Body mass index is 36.87 kg/m.  Physical Exam:   Physical Exam Vitals and nursing note reviewed.  Constitutional:      General: He is not in acute distress.    Appearance: He is well-developed. He is ill-appearing. He is not toxic-appearing.  Cardiovascular:     Rate and Rhythm: Normal rate. Rhythm irregularly irregular.     Pulses: Normal pulses.     Heart sounds: Normal heart sounds, S1 normal and S2 normal.  Pulmonary:     Effort: Pulmonary effort is normal.     Breath sounds: Normal breath sounds.     Comments: On 5 liters of oxygen Skin:    General: Skin is warm and dry.     Comments: R index finger with palpable nodule to lateral aspect; no fluctuance;  normal ROM of finger  Neurological:     Mental Status: He is alert.     GCS: GCS eye subscore is 4. GCS verbal subscore is 5. GCS motor subscore is 6.  Psychiatric:        Speech: Speech normal.        Behavior: Behavior normal. Behavior  is cooperative.    Assessment and Plan:   Chronic respiratory failure with hypoxia Overall stable, per patient and wife He is planning to transfer care to [facility], however currently remains a patient of [facility] Continue to monitor, no immediate needs at this time  Physical deconditioning Will put in order for home health PT  Hypokalemia Update labs today, will make recommendations accordingly   - Basic Metabolic Panel  R hand finger nodule Does not appear infectious, no decreased ROM Continue to monitor, plan to follow-up with dermatology to ask about this when they visit, no acute intervention needed at this time  Hypomagnesemia Will update labs, make recommendations as indicated   - Magnesium  I,Havlyn C Ratchford,acting as a scribe for [employer], PA.,have documented all relevant documentation on the behalf of Inda Coke, PA,as directed by  Inda Coke, PA while in the presence of Inda Coke, [place].  I, Inda Coke, [place], have reviewed all documentation for this visit. The documentation on 12/10/21 for the exam, diagnosis, procedures, and orders are all accurate and complete.   Inda Coke, PA-C

## 2021-12-15 ENCOUNTER — Other Ambulatory Visit: Payer: Self-pay | Admitting: Family Medicine

## 2021-12-15 NOTE — Telephone Encounter (Signed)
Pt requesting refill for Temazepam 15 mg capsules. Last OV 12/10/2021.

## 2021-12-23 ENCOUNTER — Telehealth: Payer: Self-pay | Admitting: Pharmacist

## 2021-12-23 NOTE — Chronic Care Management (AMB) (Signed)
Chronic Care Management Pharmacy Assistant   Name: Maurice Reed  MRN: [id number] DOB: 1941/09/21   Reason for Encounter: General Adherence Call    Recent office visits:  12/10/2021 OV (PCP) Inda Coke, PA; no medication changes indicated.  11/12/2021 VV (PCP) Inda Coke, PA; no orders placed, patient was advised to go to the ER.  10/01/2021 OV (PCP) Inda Coke, PA; no medication changes indicated.  Recent consult visits:  10/19/2021 OV (Cardiology) Harrell Lark, MD; no medication changes indicated.  09/21/2021 OV (Ophthalmology) Netta Corrigan; no further information available.  Hospital visits:  11/16/2021 ED to Hospital Admission due to COVID and Syncope Hold off diuretics for now (Torsemide, Spironolactone)  11/12/2021 ED to Hospital Admission due to [place] date: 11/12/2021   Discharge date: 11/16/2021 Continue remdesivir x 4 days, steroids and nebulization Continue guanfacine, encouraged incentive spirometry and flutter  Medications: Outpatient Encounter Medications as of 12/23/2021  Medication Sig Note   apixaban (ELIQUIS) 5 MG TABS tablet Take 1 tablet (5 mg total) by mouth 2 (two) times daily.    Apremilast (OTEZLA) 30 MG TABS Take 30 mg by mouth 2 (two) times daily.    Ascorbic Acid (VITAMIN C) 500 MG CAPS Take 500 mg by mouth daily.    Atropine Sulfate-NaCl 0.01-0.9 % SOLN Place 1 drop into the left eye at bedtime.    bimatoprost (LUMIGAN) 0.01 % SOLN Place 1 drop into the right eye at bedtime.    Cholecalciferol (VITAMIN D3) 3000 units TABS Take 3,000 Units by mouth daily.    clobetasol (TEMOVATE) 0.05 % external solution APPLY TO AFFECTED AREA(S)  TOPICALLY ON SCALP 1 TO 2  TIMES DAILY AS NEEDED (Patient taking differently: 1 application 2 (two) times daily as needed (for scalp).)    desonide (DESOWEN) 0.05 % lotion Apply 1 application topically daily as needed (itching).    docusate sodium (COLACE) 100 MG capsule Take 100 mg by  mouth 2 (two) times daily.    doxycycline (VIBRA-TABS) 100 MG tablet TAKE 1 TABLET BY MOUTH  TWICE DAILY (Patient taking differently: Take 100 mg by mouth 2 (two) times daily.) 11/13/2021: continuous   erythromycin ophthalmic ointment Place 1 application into both eyes at bedtime.    ipratropium-albuterol (DUONEB) 0.5-2.5 (3) MG/3ML SOLN Take 3 mLs by nebulization every 6 (six) hours as needed. (Patient taking differently: Take 3 mLs by nebulization every 6 (six) hours as needed (wheezing).)    ketorolac (ACULAR) 0.5 % ophthalmic solution Place 1 drop into both eyes 4 (four) times daily as needed (irritation).    magnesium oxide (MAG-OX) 400 MG tablet TAKE 1 TABLET BY MOUTH TWICE A DAY (Patient taking differently: Take 400 mg by mouth 2 (two) times daily.)    metoprolol succinate (TOPROL-XL) 25 MG 24 hr tablet TAKE 1 TABLET BY MOUTH  DAILY (Patient taking differently: 25 mg daily.)    mometasone (NASONEX) 50 MCG/ACT nasal spray Place 2 sprays into the nose daily. (Patient taking differently: Place 2 sprays into the nose daily as needed (congestion).)    NARCAN 4 MG/0.1ML LIQD nasal spray kit 1 spray once as needed (overdose).    nystatin ointment (MYCOSTATIN) Apply 1 application topically 2 (two) times daily. (Patient taking differently: Apply 1 application topically daily as needed (skinn irritation).)    oxyCODONE (OXY IR/ROXICODONE) 5 MG immediate release tablet Take 5 mg by mouth 3 (three) times daily as needed for severe pain. for pain    potassium chloride (KLOR-CON) 10 MEQ tablet Take  10 mEq by mouth daily.    prednisoLONE acetate (PRED FORTE) 1 % ophthalmic suspension Place 1 drop into the left eye at bedtime.    pregabalin (LYRICA) 50 MG capsule Take 50 mg by mouth 2 (two) times daily.    tadalafil (CIALIS) 20 MG tablet Take 0.5-1 tablets (10-20 mg total) by mouth every other day as needed for erectile dysfunction.    tamsulosin (FLOMAX) 0.4 MG CAPS capsule TAKE 1 CAPSULE BY MOUTH AT  BEDTIME  (Patient taking differently: 0.4 mg at bedtime.)    temazepam (RESTORIL) 15 MG capsule Take 1 capsule (15 mg total) by mouth at bedtime. TAKE 1 CAPSULE (15 MG TOTAL) BY MOUTH AT BEDTIME AS NEEDED FOR SLEEP.    torsemide (DEMADEX) 20 MG tablet Take 40 mg by mouth daily. If weight is more than 3 pounds take another 10 mg for every 3 pound gain.    TRIAMCINOLONE PO Apply 1 application topically daily as needed (itching).    zinc gluconate 50 MG tablet Take 50 mg by mouth daily.    No facility-administered encounter medications on file as of 12/23/2021.   Patient Questions: Have you had any problems recently with your health? Patient states he had COVID last month. He was holding a few medications but is now back to taking them all "like normal".  Have you had any problems with your pharmacy? Patient denies having any problems with his pharmacy.  What issues or side effects are you having with your medications? Patient denies having any issues or side effects from any of his medications.  What would you like me to pass along to Leata Mouse, CPP for him to help you with?  Patient states he does not have anything for me to pass along at this time.  What can we do to take care of you better? Patient does not have any suggestions. He is happy with his current level of care.  Care Gaps: Medicare Annual Wellness: Completed 06/14/2021 Hemoglobin A1C: 5.9% on  10/01/2021 Colonoscopy: Aged out  Future Appointments  Date Time Provider [place]  03/14/2022  3:45 PM [facility] CCM PHARMACIST [facility]   Star Rating Drugs: Valsartan 40 mg last filled 10/08/2021 90 DS  April D Calhoun, [place] Pharmacist Assistant [phone]

## 2021-12-28 ENCOUNTER — Other Ambulatory Visit: Payer: Self-pay | Admitting: Physician Assistant

## 2021-12-29 ENCOUNTER — Other Ambulatory Visit: Payer: Self-pay | Admitting: Physician Assistant

## 2021-12-29 MED ORDER — DOXYCYCLINE HYCLATE 100 MG PO TABS: 100.0000 mg | ORAL_TABLET | Freq: Two times a day (BID) | ORAL | 1 refills | Status: DC

## 2022-01-04 ENCOUNTER — Other Ambulatory Visit: Payer: Self-pay

## 2022-01-04 ENCOUNTER — Telehealth: Payer: Self-pay

## 2022-01-04 MED ORDER — TEMAZEPAM 15 MG PO CAPS: 15.0000 mg | ORAL_CAPSULE | Freq: Every day | ORAL | 0 refills | Status: DC

## 2022-01-04 NOTE — Telephone Encounter (Signed)
Is requesting temazepam to be sent over to [facility] in [place].  [facility] but [facility] are not using [facility] anymore.    States medication will also need a PA to be processed.  States that patient has been without medication for 6 days.  Would like to know if a small script could be sent in to get patient through until PA can be approved?  Last OV 12/10/21

## 2022-01-04 NOTE — Telephone Encounter (Signed)
[facility] is faxing an order over for a lab.  Would like to know if Maurice Reed could this lab on for patient to have drawn here.  Please follow back up once we have received lab order.

## 2022-01-04 NOTE — Telephone Encounter (Signed)
Please send Rx for Temazepam to pharmacy. Loaded in cart.

## 2022-01-06 ENCOUNTER — Other Ambulatory Visit: Payer: Self-pay | Admitting: *Deleted

## 2022-01-06 DIAGNOSIS — I5032 Chronic diastolic (congestive) heart failure: Secondary | ICD-10-CM

## 2022-01-06 NOTE — Telephone Encounter (Signed)
Please call pt and schedule lab appt only. I have placed order in Epic that Cardiology ordered so pt can come here for lab work.

## 2022-01-06 NOTE — Telephone Encounter (Signed)
Patient has been scheduled

## 2022-01-11 ENCOUNTER — Other Ambulatory Visit (INDEPENDENT_AMBULATORY_CARE_PROVIDER_SITE_OTHER): Payer: Medicare Other

## 2022-01-11 ENCOUNTER — Other Ambulatory Visit: Payer: Self-pay

## 2022-01-11 DIAGNOSIS — I5032 Chronic diastolic (congestive) heart failure: Secondary | ICD-10-CM | POA: Diagnosis not present

## 2022-01-13 LAB — BASIC METABOLIC PANEL
BUN: 19 mg/dL (ref 6–23)
CO2: 43 mEq/L — ABNORMAL HIGH (ref 19–32)
Calcium: 9.9 mg/dL (ref 8.4–10.5)
Chloride: 94 mEq/L — ABNORMAL LOW (ref 96–112)
Creatinine, Ser: 1.16 mg/dL (ref 0.40–1.50)
GFR: 59.32 mL/min — ABNORMAL LOW (ref >60.00)
Glucose, Bld: 109 mg/dL — ABNORMAL HIGH (ref 70–99)
Potassium: 4 mEq/L (ref 3.5–5.1)
Sodium: 140 mEq/L (ref 135–145)

## 2022-01-17 ENCOUNTER — Telehealth: Payer: Self-pay | Admitting: Physician Assistant

## 2022-01-17 NOTE — Telephone Encounter (Signed)
Medical support status application form has been dropped off by patients wife. Form is to be completed and mailed back per patients request. Form is located in [employer]- front desk.

## 2022-01-18 ENCOUNTER — Encounter: Payer: Self-pay | Admitting: Physician Assistant

## 2022-01-18 NOTE — Telephone Encounter (Signed)
Spoke to [place] pt's wife told her forms were completed and faxed. Do you want me to mail them back to you? [name] said yes. Told her okay, the letter you can access thru My Chart. [name] verbalized understanding. Form mailed to patient.

## 2022-01-18 NOTE — Telephone Encounter (Signed)
Form completed and faxed to [facility] [phone].

## 2022-02-11 ENCOUNTER — Other Ambulatory Visit: Payer: Self-pay

## 2022-02-11 ENCOUNTER — Other Ambulatory Visit: Payer: Medicare Other | Admitting: Hospice

## 2022-02-11 DIAGNOSIS — G8929 Other chronic pain: Secondary | ICD-10-CM

## 2022-02-11 DIAGNOSIS — I5032 Chronic diastolic (congestive) heart failure: Secondary | ICD-10-CM

## 2022-02-11 DIAGNOSIS — J849 Interstitial pulmonary disease, unspecified: Secondary | ICD-10-CM

## 2022-02-11 DIAGNOSIS — Z515 Encounter for palliative care: Secondary | ICD-10-CM

## 2022-02-11 DIAGNOSIS — M792 Neuralgia and neuritis, unspecified: Secondary | ICD-10-CM

## 2022-02-11 DIAGNOSIS — R0602 Shortness of breath: Secondary | ICD-10-CM

## 2022-02-11 NOTE — Progress Notes (Signed)
[facility] Palliative Care Consult Note Telephone: [phone]  Fax: [phone]  PATIENT NAME: Maurice Reed DOB: 02-Oct-1941 MRN: [id number]  PRIMARY CARE PROVIDER:   Inda Coke, [place], [place], [place] [address] [place],  [place] [address]  REFERRING PROVIDER: Inda Coke, [place] Inda Coke, [place] [address] [place],  [place] [address]  RESPONSIBLE PARTY:  Self [id number] [phone] - spouse - best to call  Extended Emergency Contact Information Primary Emergency Contact: Maurice, Reed Mobile Phone: [phone] Relation: Spouse Secondary Emergency Contact: Maurice Reed Mobile Phone: [phone] Relation: Granddaughter Contact Information     Name Relation Home Work Mobile   Twain, Stenseth Spouse   [phone]   Maurice Reed   [phone]      TELEHEALTH VISIT STATEMENT Due to the COVID-19 crisis, this visit was done via telemedicine and it was initiated and consent by this patient and or family. Video-audio (telehealth) contact was unable to be done due to technical barriers from the patients side.   Visit is to build trust and highlight Palliative Medicine as specialized medical care for people living with serious illness, aimed at facilitating better quality of life through symptoms relief, assisting with advance care planning and complex medical decision making.  [name] with patient during visit.  This is a follow up visit.  RECOMMENDATIONS/PLAN:   Advance Care Planning/Code Status: Patient is a DO NOT RESUSCITATE  Goals of Care: Goals of care include to maximize quality of life and symptom management.  MOST selections include DO NOT RESUSCITATE, limited additional intervention, IV fluids for defined trial period, antibiotics as indicated, no feeding tube.  Family is open to hospice services in the future.  Symptom management/Plan:  Shortness of breath: related to interstitial lung disease.  BIPAP at night, oxygen supplementation 3-5 L/Min.  Continue breathing treatments as ordered. CHF: Managed with Torsemide.  Followed by cardiologist. Marzella Schlein to salt and fluid limits. 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  Left shoulder pain: Patient is followed by Ortho for cortisone shot every 3 months.  Interstitial lung disease: Continue oxygen supplementation 3-4L/Min, Albuterol, Duoneb as ordered.  Patient in no respiratory distress during visit. Follow up: Palliative care will continue to follow for complex medical decision making, advance care planning, and clarification of goals. Return 6 weeks or prn. Encouraged to call provider sooner with any concerns.  CHIEF COMPLAINT: Palliative follow up  HISTORY OF PRESENT ILLNESS:  TREVIAN Reed a 81 y.o. 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, psoriasis-on Otezla followed by dermatologist, arthritis.  Recent history of COVID-19 viral infection.  Patient in no acute distress, reports no recent fall/syncope. History obtained from review of EMR, discussion with primary team, family and/or patient. Records reviewed and summarized above. All 10 point systems reviewed and are negative except as documented in history of present illness above  Review and summarization of Epic records shows history from other than patient.   Palliative Care was asked to follow this patient o help address complex decision making in the context of advance care planning and goals of care clarification.     PERTINENT MEDICATIONS:  Outpatient Encounter Medications as of 02/11/2022  Medication Sig   Apremilast (OTEZLA) 30 MG TABS Take 30 mg by mouth 2 (  two) times daily.    Ascorbic Acid (VITAMIN C) 500 MG CAPS Take 500 mg by mouth daily.   Atropine Sulfate-NaCl 0.01-0.9 % SOLN Place 1 drop into the left eye at bedtime.   bimatoprost (LUMIGAN) 0.01 % SOLN Place 1 drop into the right eye at bedtime.   Cholecalciferol (VITAMIN D3) 3000 units TABS Take 3,000 Units by mouth daily.   clobetasol (TEMOVATE) 0.05 % external solution APPLY TO AFFECTED AREA(S)  TOPICALLY ON SCALP 1 TO 2  TIMES DAILY AS NEEDED (Patient taking differently: 1 application 2 (two) times daily as needed (for scalp).)   desonide (DESOWEN) 0.05 % lotion Apply 1 application topically daily as needed (itching).   docusate sodium (COLACE) 100 MG capsule Take 100 mg by mouth 2 (two) times daily.   doxycycline (VIBRA-TABS) 100 MG tablet Take 1 tablet (100 mg total) by mouth 2 (two) times daily.   ELIQUIS 5 MG TABS tablet TAKE 1 TABLET BY MOUTH TWICE A DAY   erythromycin ophthalmic ointment Place 1 application into both eyes at bedtime.   ipratropium-albuterol (DUONEB) 0.5-2.5 (3) MG/3ML SOLN Take 3 mLs by nebulization every 6 (six) hours as needed. (Patient taking differently: Take 3 mLs by nebulization every 6 (six) hours as needed (wheezing).)   ketorolac (ACULAR) 0.5 % ophthalmic solution Place 1 drop into both eyes 4 (four) times daily as needed (irritation).   magnesium oxide (MAG-OX) 400 MG tablet TAKE 1 TABLET BY MOUTH TWICE A DAY (Patient taking differently: Take 400 mg by mouth 2 (two) times daily.)   metoprolol succinate (TOPROL-XL) 25 MG 24 hr tablet TAKE 1 TABLET BY MOUTH  DAILY (Patient taking differently: 25 mg daily.)   mometasone (NASONEX) 50 MCG/ACT nasal spray Place 2 sprays into the nose daily. (Patient taking differently: Place 2 sprays into the nose daily as needed (congestion).)   NARCAN 4 MG/0.1ML LIQD nasal spray kit 1 spray once as needed (overdose).   nystatin ointment (MYCOSTATIN) Apply 1 application topically 2 (two) times daily. (Patient taking differently: Apply 1 application  topically daily as needed (skinn irritation).)   oxyCODONE (OXY IR/ROXICODONE) 5 MG immediate release tablet Take 5 mg by mouth 3 (three) times daily as needed for severe pain. for pain   potassium chloride (KLOR-CON) 10 MEQ tablet Take 10 mEq by mouth daily.   prednisoLONE acetate (PRED FORTE) 1 % ophthalmic suspension Place 1 drop into the left eye at bedtime.   pregabalin (LYRICA) 50 MG capsule Take 50 mg by mouth 2 (two) times daily.   tadalafil (CIALIS) 20 MG tablet Take 0.5-1 tablets (10-20 mg total) by mouth every other day as needed for erectile dysfunction.   tamsulosin (FLOMAX) 0.4 MG CAPS capsule TAKE 1 CAPSULE BY MOUTH AT  BEDTIME (Patient taking differently: 0.4 mg at bedtime.)   temazepam (RESTORIL) 15 MG capsule Take 1 capsule (15 mg total) by mouth at bedtime. TAKE 1 CAPSULE (15 MG TOTAL) BY MOUTH AT BEDTIME AS NEEDED FOR SLEEP.   torsemide (DEMADEX) 20 MG tablet Take 40 mg by mouth daily. If weight is more than 3 pounds take another 10 mg for every 3 pound gain.   TRIAMCINOLONE PO Apply 1 application topically daily as needed (itching).   zinc gluconate 50 MG tablet Take 50 mg by mouth daily.   No facility-administered encounter medications on file as of 02/11/2022.    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 More than 50% of the time in this consultation was spent on counseling and coordinating  communication   Thank you for the opportunity to participate in the care of LERONE ONDER Please call our office at [phone] if we can be of additional assistance.  Note: Portions of this note were generated with Lobbyist. Dictation errors may occur despite best attempts at proofreading.  Teodoro Spray, NP

## 2022-03-01 ENCOUNTER — Other Ambulatory Visit: Payer: Self-pay | Admitting: Physician Assistant

## 2022-03-14 ENCOUNTER — Telehealth: Payer: Medicare Other

## 2022-03-22 ENCOUNTER — Other Ambulatory Visit: Payer: Self-pay

## 2022-03-22 DIAGNOSIS — I10 Essential (primary) hypertension: Secondary | ICD-10-CM

## 2022-03-24 ENCOUNTER — Other Ambulatory Visit (INDEPENDENT_AMBULATORY_CARE_PROVIDER_SITE_OTHER): Payer: Medicare Other

## 2022-03-24 ENCOUNTER — Encounter: Payer: Self-pay | Admitting: Physician Assistant

## 2022-03-24 DIAGNOSIS — I10 Essential (primary) hypertension: Secondary | ICD-10-CM | POA: Diagnosis not present

## 2022-03-24 LAB — COMPREHENSIVE METABOLIC PANEL
ALT: 13 U/L (ref 0–53)
AST: 20 U/L (ref 0–37)
Albumin: 3.6 g/dL (ref 3.5–5.2)
Alkaline Phosphatase: 82 U/L (ref 39–117)
BUN: 18 mg/dL (ref 6–23)
CO2: 39 mEq/L — ABNORMAL HIGH (ref 19–32)
Calcium: 9.2 mg/dL (ref 8.4–10.5)
Chloride: 94 mEq/L — ABNORMAL LOW (ref 96–112)
Creatinine, Ser: 1.15 mg/dL (ref 0.40–1.50)
GFR: 59.86 mL/min — ABNORMAL LOW (ref >60.00)
Glucose, Bld: 106 mg/dL — ABNORMAL HIGH (ref 70–99)
Potassium: 3.7 mEq/L (ref 3.5–5.1)
Sodium: 138 mEq/L (ref 135–145)
Total Bilirubin: 0.6 mg/dL (ref 0.2–1.2)
Total Protein: 6.7 g/dL (ref 6.0–8.3)

## 2022-03-24 LAB — MAGNESIUM: Magnesium: 1.8 mg/dL (ref 1.5–2.5)

## 2022-03-24 LAB — CBC WITH DIFFERENTIAL/PLATELET
Basophils Absolute: 0 10*3/uL (ref 0.0–0.1)
Basophils Relative: 0.1 % (ref 0.0–3.0)
Eosinophils Absolute: 0.1 10*3/uL (ref 0.0–0.7)
Eosinophils Relative: 1.4 % (ref 0.0–5.0)
HCT: 40 % (ref 39.0–52.0)
Hemoglobin: 13.2 g/dL (ref 13.0–17.0)
Lymphocytes Relative: 25.7 % (ref 12.0–46.0)
Lymphs Abs: 1.6 10*3/uL (ref 0.7–4.0)
MCHC: 33 g/dL (ref 30.0–36.0)
MCV: 102.9 fl — ABNORMAL HIGH (ref 78.0–100.0)
Monocytes Absolute: 0.7 10*3/uL (ref 0.1–1.0)
Monocytes Relative: 11.2 % (ref 3.0–12.0)
Neutro Abs: 3.8 10*3/uL (ref 1.4–7.7)
Neutrophils Relative %: 61.6 % (ref 43.0–77.0)
Platelets: 168 10*3/uL (ref 150.0–400.0)
RBC: 3.88 Mil/uL — ABNORMAL LOW (ref 4.22–5.81)
RDW: 13.4 % (ref 11.5–15.5)
WBC: 6.2 10*3/uL (ref 4.0–10.5)

## 2022-03-27 ENCOUNTER — Encounter: Payer: Self-pay | Admitting: Physician Assistant

## 2022-04-04 ENCOUNTER — Other Ambulatory Visit: Payer: Medicare Other | Admitting: Hospice

## 2022-04-04 DIAGNOSIS — I5032 Chronic diastolic (congestive) heart failure: Secondary | ICD-10-CM

## 2022-04-04 DIAGNOSIS — G8929 Other chronic pain: Secondary | ICD-10-CM

## 2022-04-04 DIAGNOSIS — M792 Neuralgia and neuritis, unspecified: Secondary | ICD-10-CM

## 2022-04-04 DIAGNOSIS — Z515 Encounter for palliative care: Secondary | ICD-10-CM

## 2022-04-04 DIAGNOSIS — J849 Interstitial pulmonary disease, unspecified: Secondary | ICD-10-CM

## 2022-04-04 NOTE — Progress Notes (Signed)
? ? ?Man[facility] ?Community Palliative Care Consult Note ?Telephone: [phone]  ?Fax: [phone] ? ?PATIENT NAME: Maurice Reed ?DOB: [birth year] ?MRN: [id number] ? ?PRIMARY CARE PROVIDER:   Inda Coke, PA ?Inda Coke, [place] ?[place],  [place] [address] ? ?REFERRING PROVIDER: Inda Coke, PA ?Inda Coke, [place] ?[place],   [address] ? ?RESPONSIBLE PARTY:  Self [phone] ?[phone] - spouse - best to call ? Extended Emergency Contact Information ?Primary Emergency Contact: Maurice Reed ?Mobile Phone: [phone] ?Relation: Spouse ?Secondary Emergency Contact: Perrin Maltese ?Mobile Phone: [phone] ?Relation: Granddaughter ?Contact Information   ? ? Name Relation Home Work Mobile  ? Aldean, Pipe Spouse   [phone]  ? Heloise Ochoa Granddaughter   [phone]  ? ?  ? ?TELEHEALTH VISIT STATEMENT ?Due to the COVID-19 crisis, this visit was done via telemedicine and it was initiated and consent by this patient and or family.  ?Visit is to build trust and highlight Palliative Medicine as specialized medical care for people living with serious illness, aimed at facilitating better quality of life through symptoms relief, assisting with advance care planning and complex medical decision making.  [name] is with patient during visit.  This is a follow up visit. ? ?RECOMMENDATIONS/PLAN:  ? ?Advance Care Planning/Code Status: Patient is a DO NOT RESUSCITATE ? ?Goals of Care: Goals of care include to maximize quality of life and symptom management.  MOST selections include DO NOT RESUSCITATE, limited additional intervention, IV fluids for defined trial period, antibiotics as indicated, no feeding tube.  Family is open to hospice services in the future. ? ?Symptom management/Plan:  ?CHF: Recent increase in Torsemide.  Followed by cardiologist. Marzella Schlein to salt and fluid limits. Wear compression stockings. Elevate BLE during the day as much as  possible to promote circulation. Monitor weight closely and report weight gain of 3 Ibs in a day or 5 Ibs in a week. Current weight is 262.2 Ib down from 270 Ib 03/20/22. Patient reported BP is fairly stable in the low 628B to 151V systolic,  today is 616/07.  ?Shortness of breath: related to interstitial lung disease. BIPAP at night, oxygen supplementation 3-5 L/Min.  Continue breathing treatments - Ventolin, Combivent as ordered. Followed by Pulmonologist.  ? ?Neuropathic pain: Managed by [facility] pain clinic. 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 Effective. ?Left shoulder pain:  Stable. Patient is followed by Ortho for cortisone shot every 3 months.  ? ?Follow up: Palliative care will continue to follow for complex medical decision making, advance care planning, and clarification of goals. Return 6 weeks or prn. Encouraged to call provider sooner with any concerns. ? ?CHIEF COMPLAINT: Palliative follow up ? ?HISTORY OF PRESENT ILLNESS:  DEBRA CALABRETTA a 81 y.o. 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, psoriasis-on Otezla followed by dermatologist, arthritis.  Recent history of COVID-19 viral infection.  Patient in no acute distress, denies pain/discomfort, no dizziness/headaches, reports no recent fall/syncope. History obtained from review of EMR, discussion with primary team, family and/or patient. Records reviewed and summarized above. All 10 point systems reviewed and are negative except as documented in history of present illness above ? ?Review and summarization of Epic records shows history from other than patient.  ? ?Palliative Care was asked to follow this patient o help address complex decision making  in the context of advance care planning and goals of care  clarification.  ? ? ? ?PERTINENT MEDICATIONS:  ?Outpatient Encounter Medications as of 04/04/2022  ?Medication Sig  ? Apremilast (OTEZLA) 30 MG TABS Take 30 mg by mouth 2 (two) times daily.  ? Ascorbic Acid (VITAMIN C) 500 MG CAPS Take 500 mg by mouth daily.  ? Atropine Sulfate-NaCl 0.01-0.9 % SOLN Place 1 drop into the left eye at bedtime.  ? bimatoprost (LUMIGAN) 0.01 % SOLN Place 1 drop into the right eye at bedtime.  ? Cholecalciferol (VITAMIN D3) 3000 units TABS Take 3,000 Units by mouth daily.  ? clobetasol (TEMOVATE) 0.05 % external solution APPLY TO AFFECTED AREA(S)  TOPICALLY ON SCALP 1 TO 2  TIMES DAILY AS NEEDED (Patient taking differently: 1 application 2 (two) times daily as needed (for scalp).)  ? desonide (DESOWEN) 0.05 % lotion Apply 1 application topically daily as needed (itching).  ? docusate sodium (COLACE) 100 MG capsule Take 100 mg by mouth 2 (two) times daily.  ? doxycycline (VIBRA-TABS) 100 MG tablet Take 1 tablet (100 mg total) by mouth 2 (two) times daily.  ? ELIQUIS 5 MG TABS tablet TAKE 1 TABLET BY MOUTH TWICE A DAY  ? erythromycin ophthalmic ointment Place 1 application into both eyes at bedtime.  ? ipratropium-albuterol (DUONEB) 0.5-2.5 (3) MG/3ML SOLN Take 3 mLs by nebulization every 6 (six) hours as needed. (Patient taking differently: Take 3 mLs by nebulization every 6 (six) hours as needed (wheezing).)  ? ketorolac (ACULAR) 0.5 % ophthalmic solution Place 1 drop into both eyes 4 (four) times daily as needed (irritation).  ? magnesium oxide (MAG-OX) 400 MG tablet TAKE 1 TABLET BY MOUTH TWICE A DAY (Patient taking differently: Take 400 mg by mouth 2 (two) times daily.)  ? metoprolol succinate (TOPROL-XL) 25 MG 24 hr tablet TAKE 1 TABLET BY MOUTH  DAILY (Patient taking differently: 25 mg daily.)  ? mometasone (NASONEX) 50 MCG/ACT nasal spray Place 2 sprays into the nose daily. (Patient taking differently: Place 2 sprays into the nose daily as needed (congestion).)  ? NARCAN 4 MG/0.1ML  LIQD nasal spray kit 1 spray once as needed (overdose).  ? nystatin ointment (MYCOSTATIN) Apply 1 application topically 2 (two) times daily. (Patient taking differently: Apply 1 application topically daily as needed (skinn irritation).)  ? oxyCODONE (OXY IR/ROXICODONE) 5 MG immediate release tablet Take 5 mg by mouth 3 (three) times daily as needed for severe pain. for pain  ? potassium chloride (KLOR-CON) 10 MEQ tablet Take 10 mEq by mouth daily.  ? prednisoLONE acetate (PRED FORTE) 1 % ophthalmic suspension Place 1 drop into the left eye at bedtime.  ? pregabalin (LYRICA) 50 MG capsule Take 50 mg by mouth 2 (two) times daily.  ? tadalafil (CIALIS) 20 MG tablet TAKE 1/2 TO 1 TABLET BY MOUTH EVERY OTHER DAY AS NEEDED FOR ERECTILE DYSFUNCTION  ? tamsulosin (FLOMAX) 0.4 MG CAPS capsule TAKE 1 CAPSULE BY MOUTH AT  BEDTIME (Patient taking differently: 0.4 mg at bedtime.)  ? temazepam (RESTORIL) 15 MG capsule Take 1 capsule (15 mg total) by mouth at bedtime. TAKE 1 CAPSULE (15 MG TOTAL) BY MOUTH AT BEDTIME AS NEEDED FOR SLEEP.  ? torsemide (DEMADEX) 20 MG tablet Take 40 mg by mouth daily. If weight is more than 3 pounds take another 10 mg for every 3 pound gain.  ? TRIAMCINOLONE PO Apply 1 application topically daily as needed (itching).  ? zinc gluconate 50 MG tablet Take 50 mg by  mouth daily.  ? ?No facility-administered encounter medications on file as of 04/04/2022.  ? ? ?HOSPICE ELIGIBILITY/DIAGNOSIS: TBD ? ?PAST MEDICAL HISTORY:  ?Past Medical History:  ?Diagnosis Date  ? Arthritis   ? Atrial fibrillation ([place])   ? 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 Diverticula, bladder   ? Erectile dysfunction   ? GERD (gastroesophageal reflux disease)   ? Kidney stones   ? Obesity   ? OSA (obstructive sleep apnea)   ? Pulmonary embolism ([place])   ?  ? ?ALLERGIES:  ?Allergies  ?Allergen Reactions  ? Other Itching  ?  Cats: watery eyes, itching, sneezing  ?   ? ?I spent 45 minutes providing this consultation;  this includes time spent with patient/family, chart review and documentation. More than 50% of the time in this consultation was spent on counseling and coordinating communication  ? ?Thank you for the opportunity to pa

## 2022-04-18 ENCOUNTER — Telehealth: Payer: Self-pay | Admitting: Physician Assistant

## 2022-04-18 MED ORDER — METOPROLOL SUCCINATE ER 25 MG PO TB24: 25.0000 mg | ORAL_TABLET | Freq: Every day | ORAL | 3 refills | Status: DC

## 2022-04-18 NOTE — Telephone Encounter (Signed)
Pt's spouse says it is ok to keep the 90 count of each fill. ? ?.. ?Encourage patient to contact the pharmacy for refills or they can request refills through [facility] ? ?LAST APPOINTMENT DATE:   ?12/10/21 ? ?NEXT APPOINTMENT DATE: ?N/a ? ? ?MEDICATION: ? ?metoprolol succinate (TOPROL-XL) 25 MG 24 hr tablet [id number]  ? ? ?Is the patient out of medication?  ?No. ?Has "a few days left" of the medication. ? ? ? ?PHARMACY: ?[facility], [address] [place], [place] [address]  ?Phone:  [phone]  Fax:  [phone]  ? ? ? ?Let patient know to contact pharmacy at the end of the day to make sure medication is ready. ? ?Please notify patient to allow 48-72 hours to process  ?

## 2022-04-18 NOTE — Telephone Encounter (Signed)
Left message on voicemail prescription requested was sent to pharmacy. Any questions please call office. ?

## 2022-05-03 ENCOUNTER — Encounter: Payer: Self-pay | Admitting: Physician Assistant

## 2022-05-13 ENCOUNTER — Other Ambulatory Visit: Payer: Self-pay | Admitting: Physician Assistant

## 2022-05-20 ENCOUNTER — Other Ambulatory Visit: Payer: Medicare Other | Admitting: Hospice

## 2022-05-21 ENCOUNTER — Emergency Department (HOSPITAL_BASED_OUTPATIENT_CLINIC_OR_DEPARTMENT_OTHER): Payer: Medicare Other

## 2022-05-21 ENCOUNTER — Other Ambulatory Visit: Payer: Self-pay

## 2022-05-21 ENCOUNTER — Encounter (HOSPITAL_BASED_OUTPATIENT_CLINIC_OR_DEPARTMENT_OTHER): Payer: Self-pay | Admitting: Emergency Medicine

## 2022-05-21 ENCOUNTER — Emergency Department (HOSPITAL_BASED_OUTPATIENT_CLINIC_OR_DEPARTMENT_OTHER)
Admission: EM | Admit: 2022-05-21 | Discharge: 2022-05-21 | Disposition: A | Discharge status: Home / Self Care | Payer: Medicare Other | Attending: Emergency Medicine | Admitting: Emergency Medicine

## 2022-05-21 DIAGNOSIS — Z7901 Long term (current) use of anticoagulants: Secondary | ICD-10-CM | POA: Insufficient documentation

## 2022-05-21 DIAGNOSIS — M79605 Pain in left leg: Secondary | ICD-10-CM | POA: Insufficient documentation

## 2022-05-21 DIAGNOSIS — M7652 Patellar tendinitis, left knee: Secondary | ICD-10-CM | POA: Diagnosis not present

## 2022-05-21 DIAGNOSIS — M25562 Pain in left knee: Secondary | ICD-10-CM | POA: Diagnosis present

## 2022-05-21 DIAGNOSIS — Z79899 Other long term (current) drug therapy: Secondary | ICD-10-CM | POA: Insufficient documentation

## 2022-05-21 IMAGING — DX DG KNEE COMPLETE 4+V*L*
4 series · 4 of 4 positions shown · non-contrast
Comparison: None Available.

CLINICAL DATA: Pain

EXAM:
LEFT KNEE - COMPLETE 4+ VIEW

[knee ap]
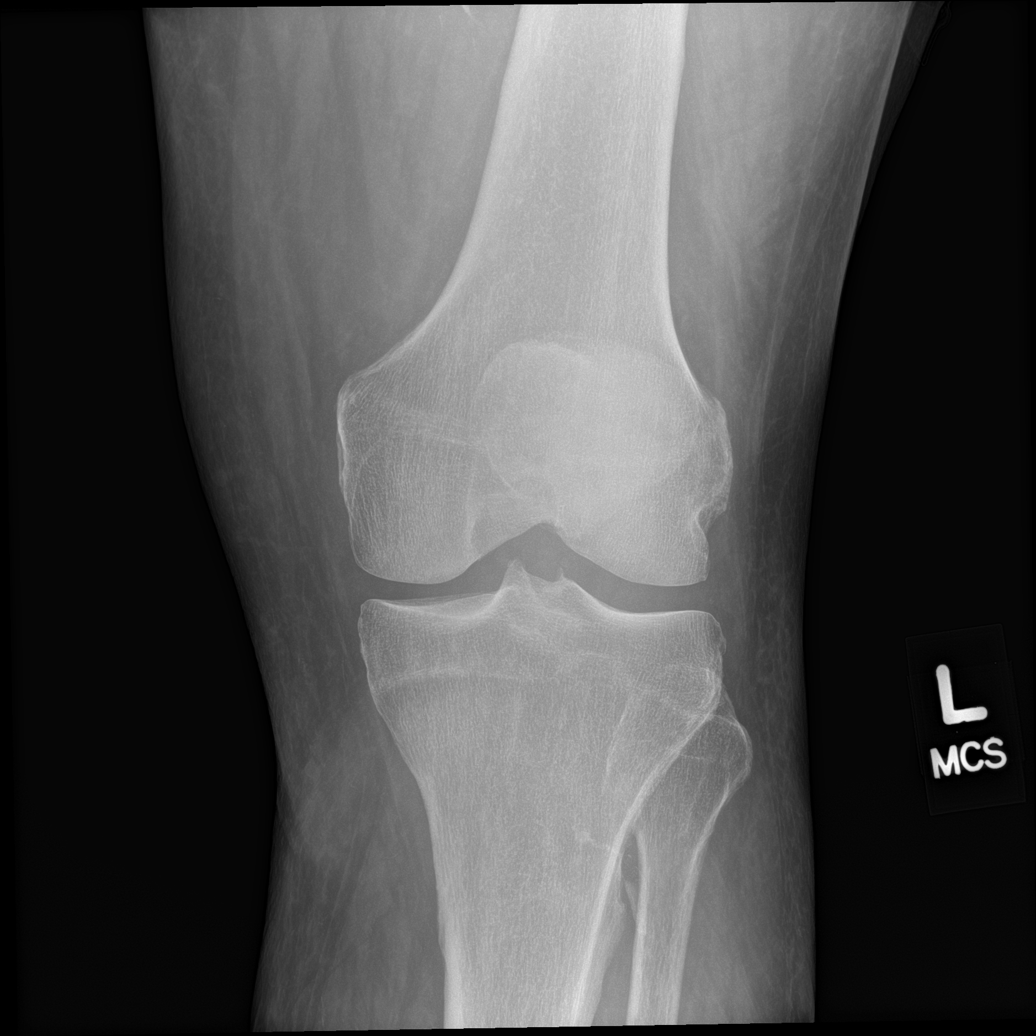

[knee lat]
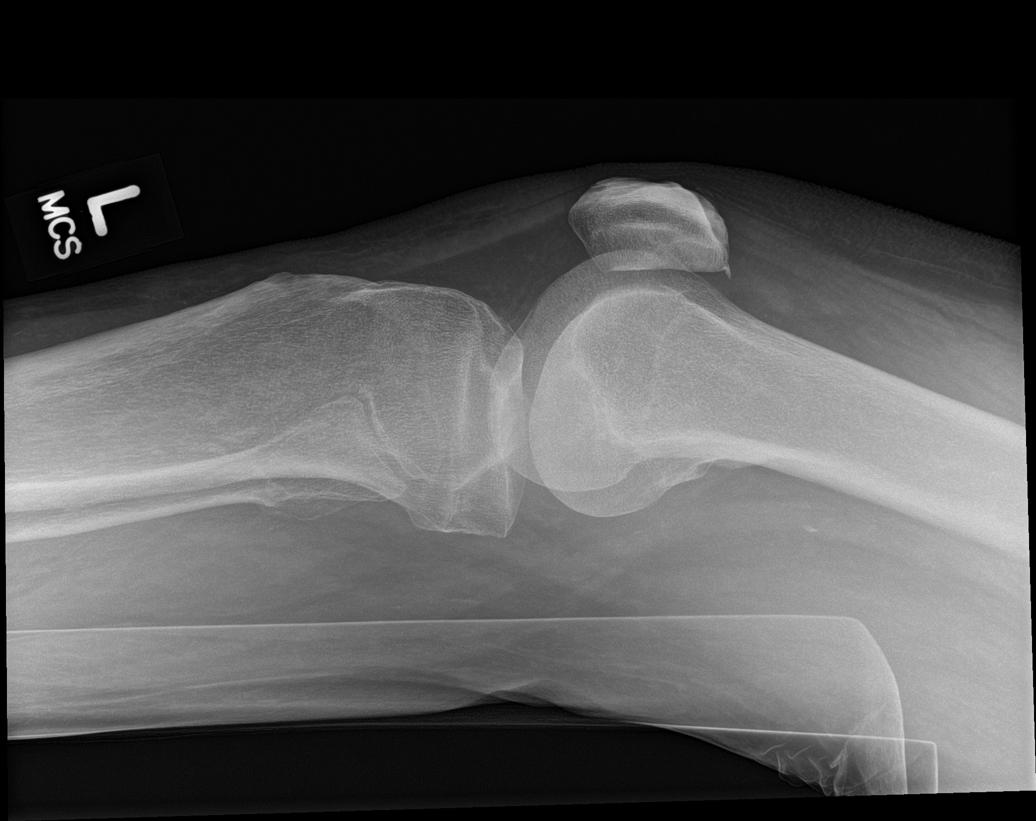

[knee obl (1 of 2)]
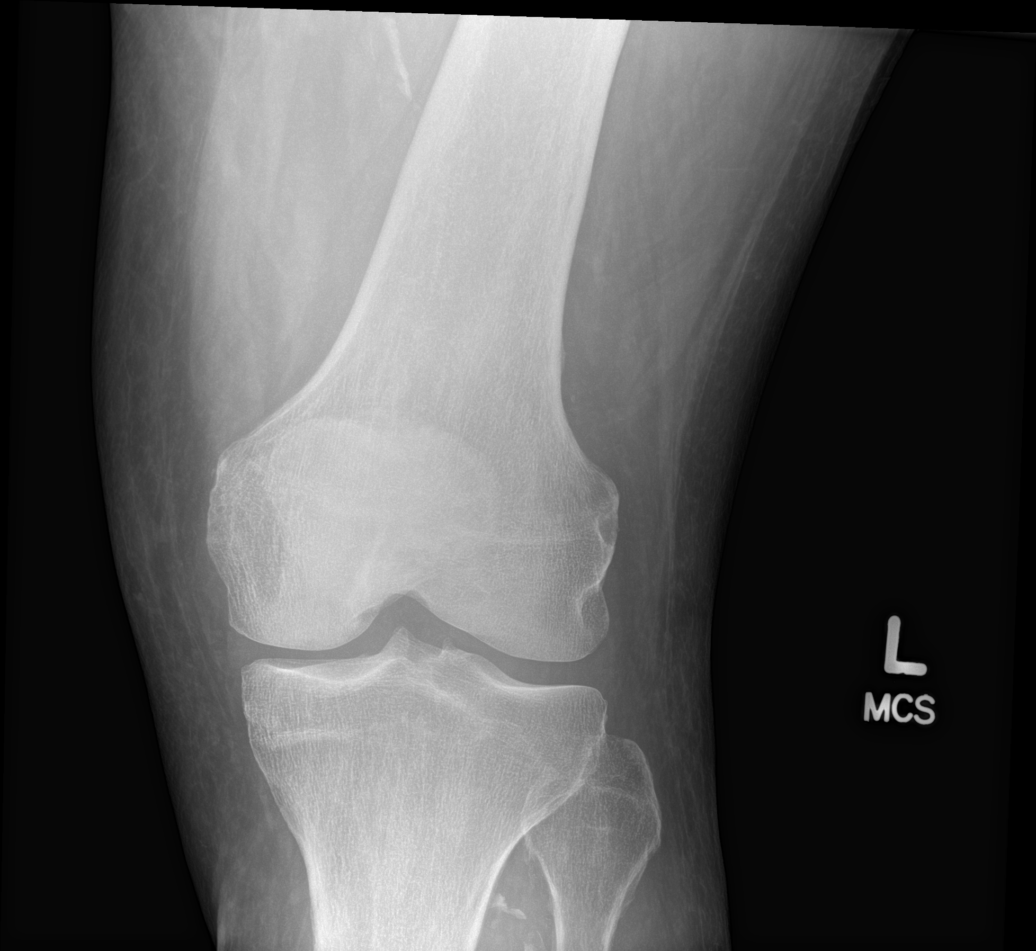

[knee obl (2 of 2)]
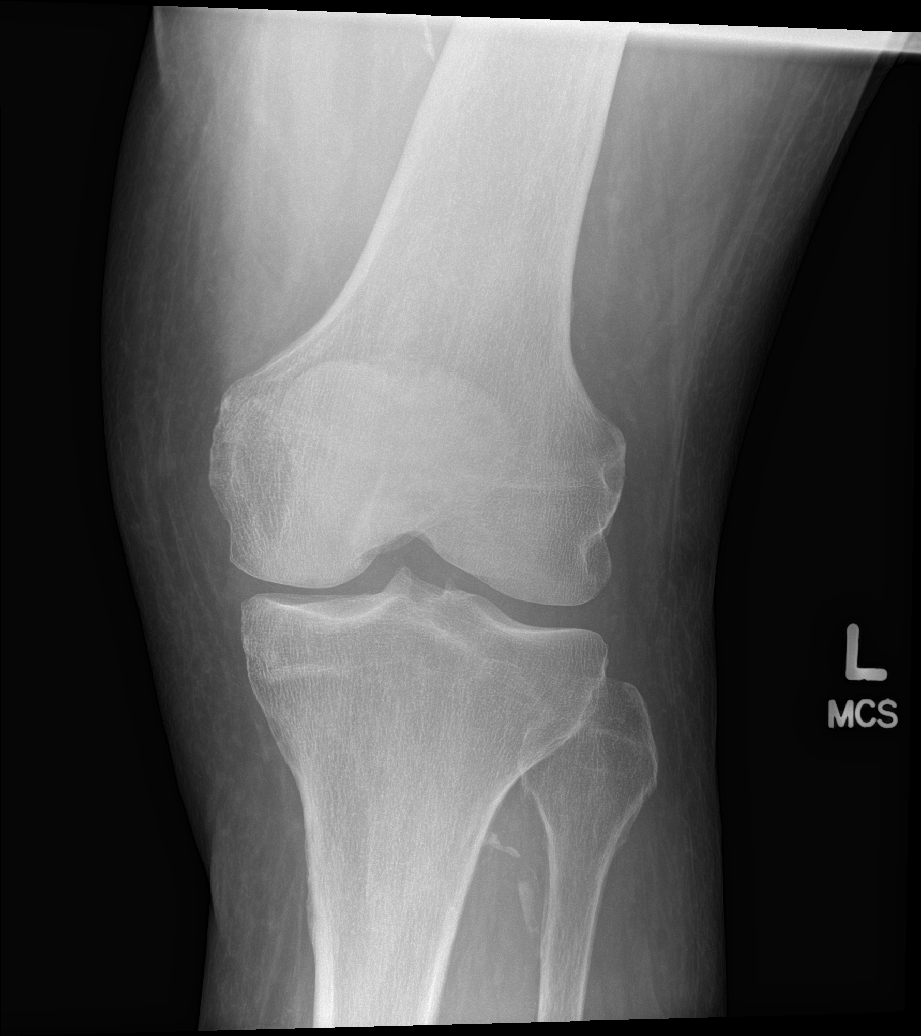

[4 of 4 positions shown; findings below may reference images not displayed]

FINDINGS: No fracture or dislocation is seen. There is no significant
effusion. Small bony spurs seen in the patella. There are scattered
vascular calcifications in the soft tissues.
IMPRESSION: No recent fracture or dislocation is seen in the left knee.
Degenerative changes with minimal bony spurs are noted.

## 2022-05-21 IMAGING — US US EXTREM LOW VENOUS*L*
1 series · 14 of 24 positions shown · non-contrast
Comparison: None Available.

CLINICAL DATA: Left leg pain

EXAM:
LEFT LOWER EXTREMITY VENOUS DOPPLER ULTRASOUND
TECHNIQUE: Gray-scale sonography with compression, as well as color and duplex
ultrasound, were performed to evaluate the deep venous system(s)
from the level of the common femoral vein through the popliteal and
proximal calf veins.

[Series 1: us venous img lower uni left (dvt) · portal-venous · 14 of 41 slices shown]
[im 1/41]
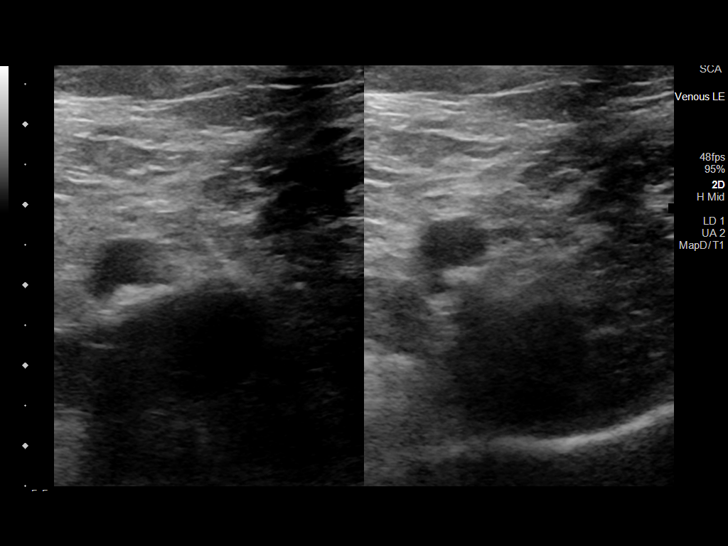
[im 4/41]
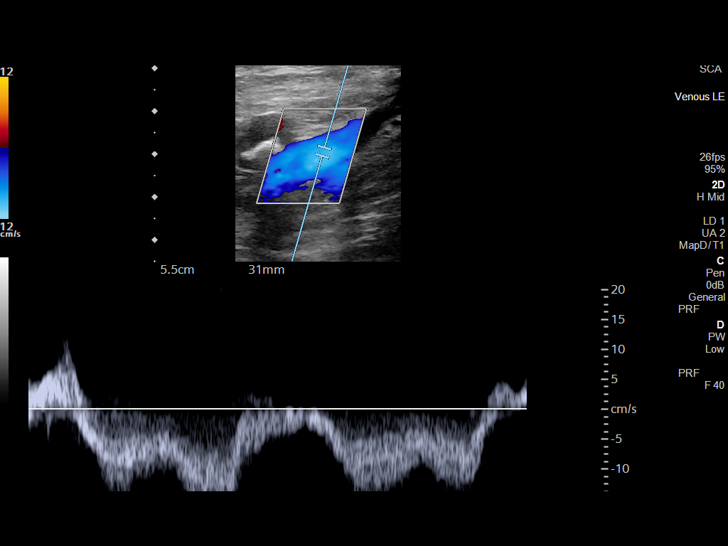
[im 7/41]
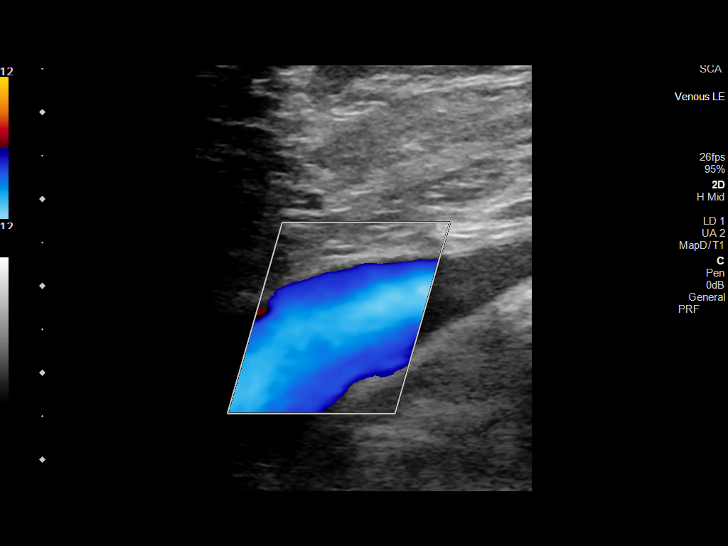
[im 11/41]
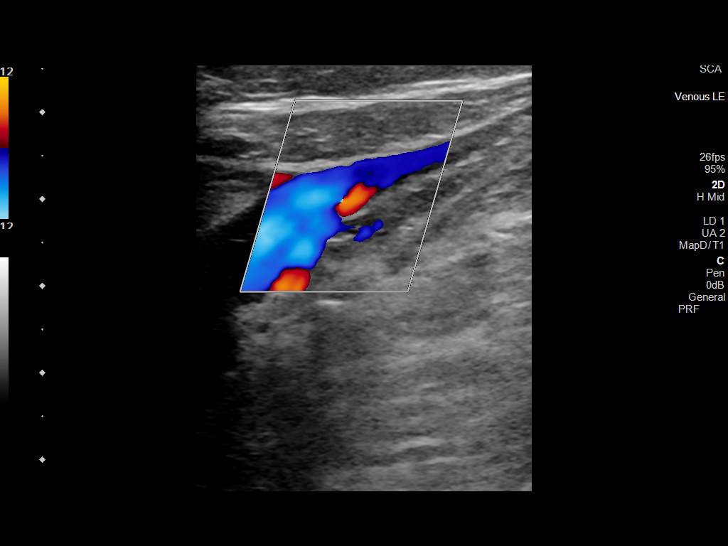
[im 13/41]
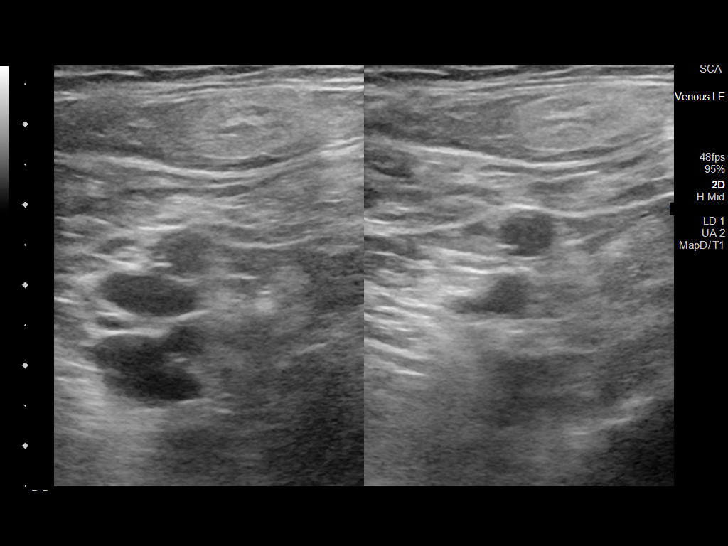
[im 16/41]
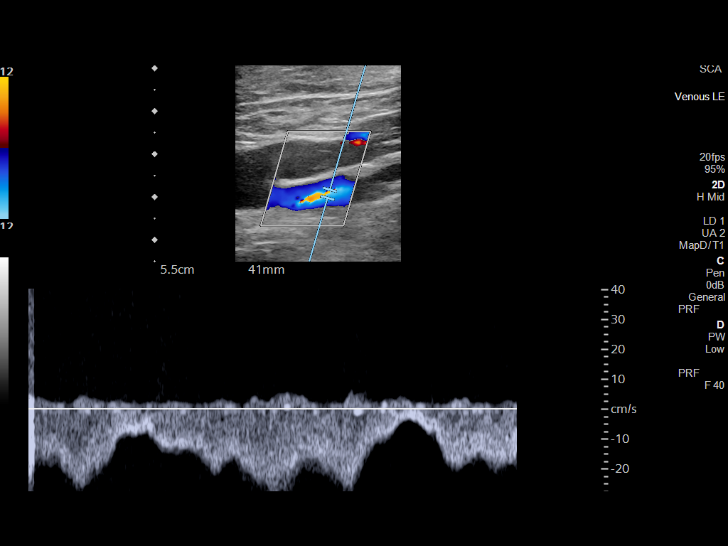
[im 20/41]
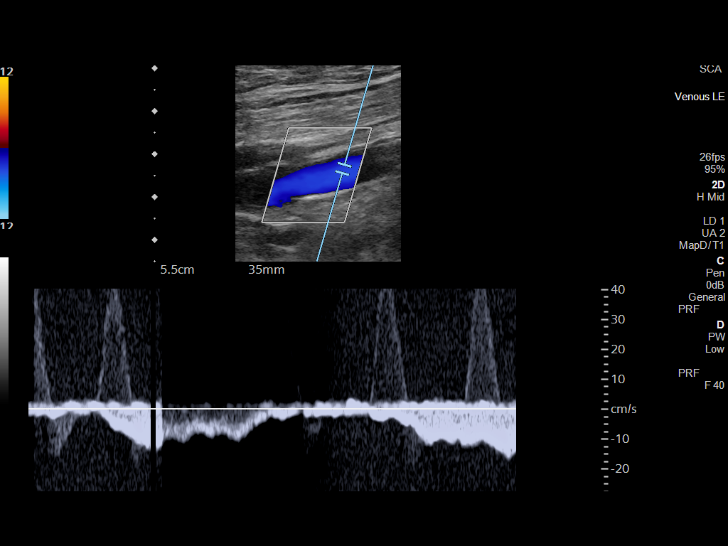
[im 21/41]
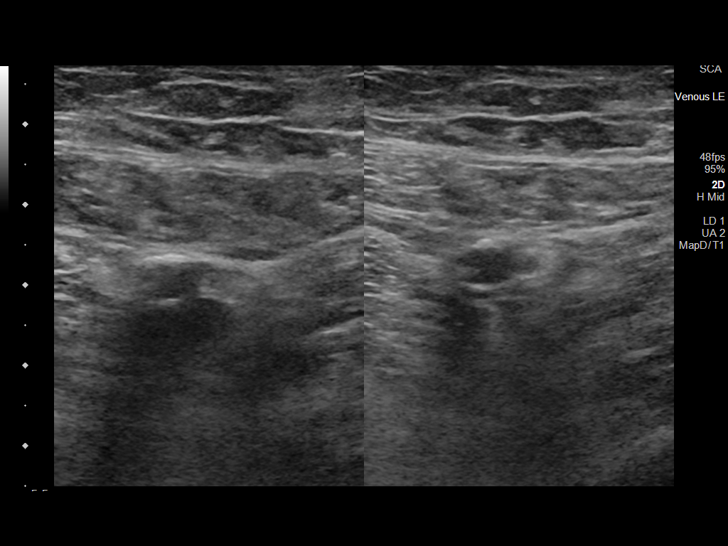
[im 25/41]
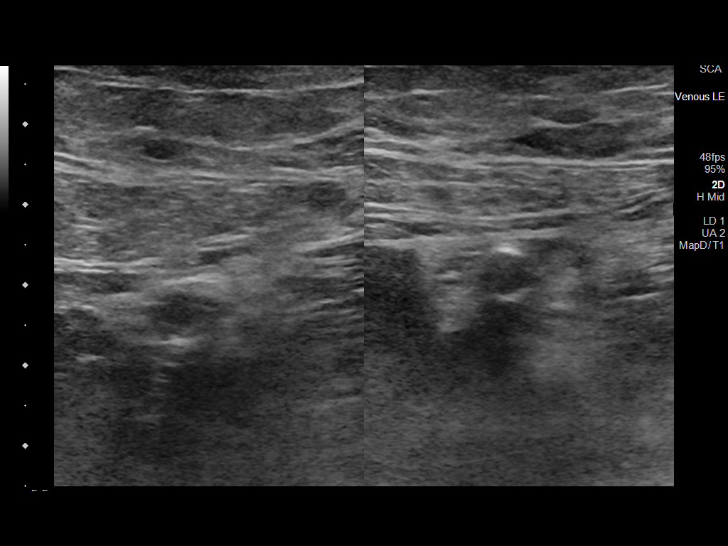
[im 28/41]
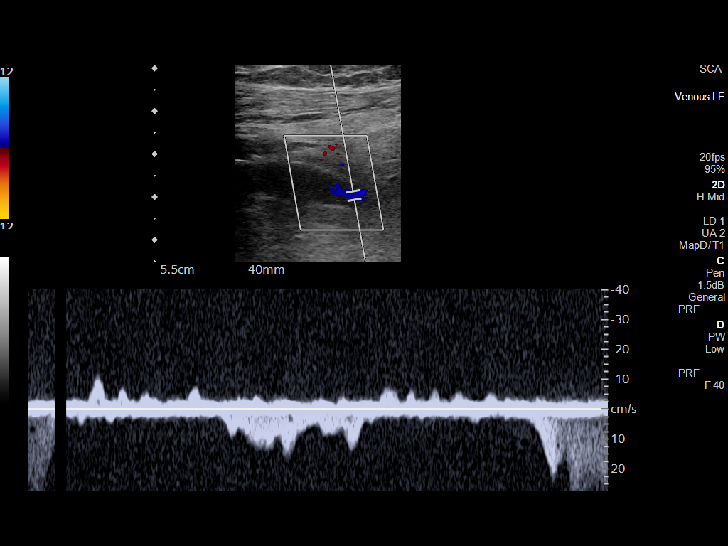
[im 32/41]
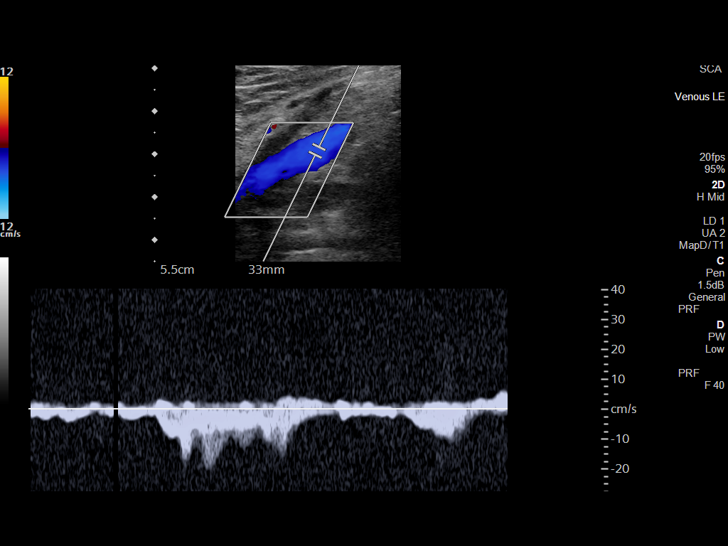
[im 34/41]
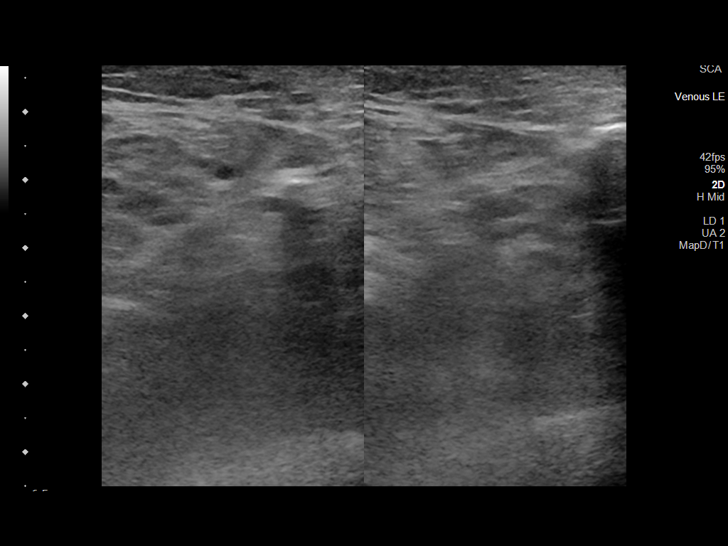
[im 37/41]
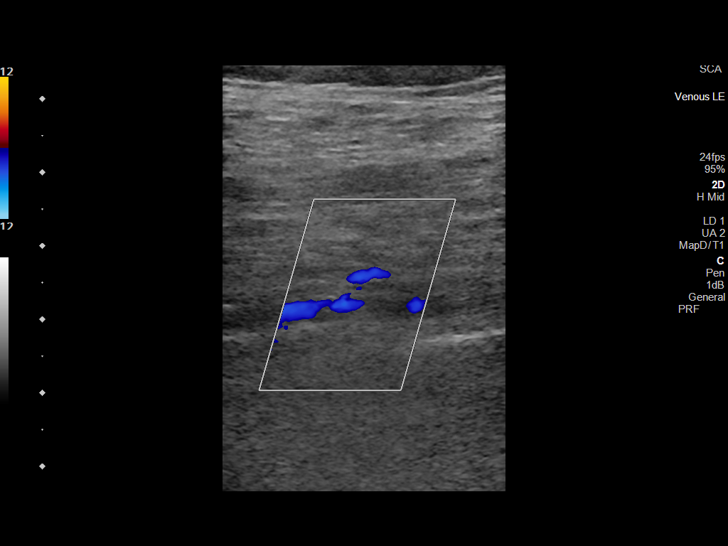
[im 41/41]
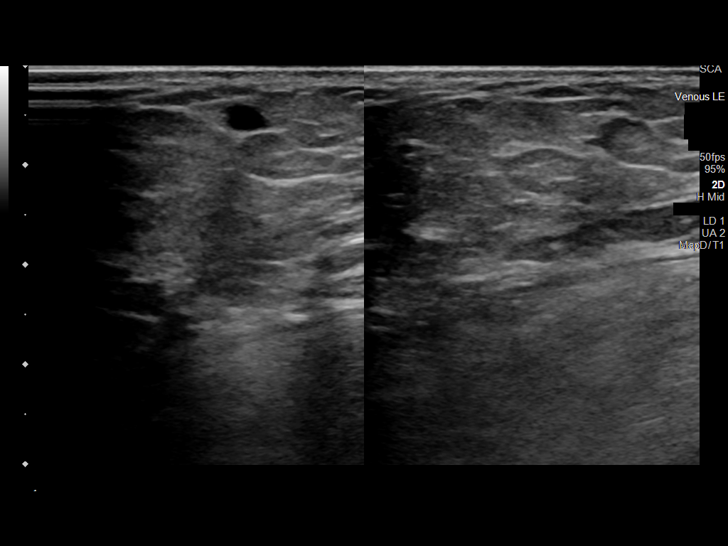

[14 of 24 positions shown; findings below may reference images not displayed]

FINDINGS: VENOUS

Normal compressibility of the common femoral, superficial femoral,
and popliteal veins, as well as the visualized calf veins.
Visualized portions of profunda femoral vein and great saphenous
vein unremarkable. No filling defects to suggest DVT on grayscale or
color Doppler imaging. Doppler waveforms show normal direction of
venous flow, normal respiratory plasticity and response to
augmentation.

Limited views of the contralateral common femoral vein are
unremarkable.

OTHER

None.

Limitations: none
IMPRESSION: Negative.

## 2022-05-21 NOTE — ED Provider Notes (Signed)
[facility] EMERGENCY DEPT Provider Note   CSN: [id number] Arrival date & time: 05/21/22  1503     History  Chief Complaint  Patient presents with   Leg Pain    Maurice Reed is a 81 y.o. male who presents to the emergency department complaining of left leg pain onset last night.  Notes that he has been rocking in a rocking chair for approximately 2 hours and noticed the pain afterwards.  Tried Tylenol without relief in his symptoms.  Denies swelling, color change.  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  Patient's wife voices concern for DVT at this time.  The history is provided by the patient. No language interpreter was used.       Home Medications Prior to Admission medications   Medication Sig Start Date End Date Taking? Authorizing Provider  Apremilast (OTEZLA) 30 MG TABS Take 30 mg by mouth 2 (two) times daily.    [provider]  Ascorbic Acid (VITAMIN C) 500 MG CAPS Take 500 mg by mouth daily.    [provider]  Atropine Sulfate-NaCl 0.01-0.9 % SOLN Place 1 drop into the left eye at bedtime.    [provider]  bimatoprost (LUMIGAN) 0.01 % SOLN Place 1 drop into the right eye at bedtime.    [provider]  Cholecalciferol (VITAMIN D3) 3000 units TABS Take 3,000 Units by mouth daily.    [provider]  clobetasol (TEMOVATE) 0.05 % external solution APPLY TO AFFECTED AREA(S)  TOPICALLY ON SCALP 1 TO 2  TIMES DAILY AS NEEDED Patient taking differently: 1 application 2 (two) times daily as needed (for scalp). 09/06/21   Inda Coke, PA  desonide (DESOWEN) 0.05 % lotion Apply 1 application topically daily as needed (itching). 07/20/12   [provider]  docusate sodium (COLACE) 100 MG capsule Take 100 mg by mouth 2 (two) times daily.    [provider]  doxycycline (VIBRA-TABS) 100 MG tablet Take 1 tablet (100 mg total) by mouth 2 (two) times daily. 12/29/21    Inda Coke, PA  ELIQUIS 5 MG TABS tablet TAKE 1 TABLET BY MOUTH TWICE A DAY 12/28/21   Inda Coke, PA  erythromycin ophthalmic ointment Place 1 application into both eyes at bedtime. 05/01/18   [provider]  ipratropium-albuterol (DUONEB) 0.5-2.5 (3) MG/3ML SOLN Take 3 mLs by nebulization every 6 (six) hours as needed. Patient taking differently: Take 3 mLs by nebulization every 6 (six) hours as needed (wheezing). 11/12/21   Inda Coke, PA  ketorolac (ACULAR) 0.5 % ophthalmic solution Place 1 drop into both eyes 4 (four) times daily as needed (irritation). 03/18/21   [provider]  magnesium oxide (MAG-OX) 400 MG tablet TAKE 1 TABLET BY MOUTH TWICE A DAY Patient taking differently: Take 400 mg by mouth 2 (two) times daily. 08/30/21   Inda Coke, PA  metoprolol succinate (TOPROL-XL) 25 MG 24 hr tablet Take 1 tablet (25 mg total) by mouth daily. 04/18/22   Inda Coke, PA  mometasone (NASONEX) 50 MCG/ACT nasal spray Place 2 sprays into the nose daily. Patient taking differently: Place 2 sprays into the nose daily as needed (congestion). 03/05/19   Rigoberto Noel, MD  NARCAN 4 MG/0.1ML LIQD nasal spray kit 1 spray once as needed (overdose). 08/22/18   [provider]  nystatin ointment (MYCOSTATIN) Apply 1 application topically 2 (two) times daily. Patient taking differently: Apply 1 application topically daily as needed (skinn  irritation). 09/22/20   Inda Coke, PA  oxyCODONE (OXY IR/ROXICODONE) 5 MG immediate release tablet Take 5 mg by mouth 3 (three) times daily as needed for severe pain. for pain 04/23/18   [provider]  potassium chloride (KLOR-CON) 10 MEQ tablet Take 10 mEq by mouth daily. 11/22/21 11/22/22  [provider]  prednisoLONE acetate (PRED FORTE) 1 % ophthalmic suspension Place 1 drop into the left eye at bedtime. 04/25/19   [provider]  pregabalin (LYRICA) 50 MG capsule Take 50 mg by mouth 2  (two) times daily.    Jeanella Anton, NP  tadalafil (CIALIS) 20 MG tablet TAKE 1/2 TO 1 TABLET BY MOUTH EVERY OTHER DAY AS NEEDED FOR ERECTILE DYSFUNCTION 03/02/22   Inda Coke, PA  tamsulosin (FLOMAX) 0.4 MG CAPS capsule Take 1 capsule by mouth at bedtime 05/13/22   Inda Coke, PA  temazepam (RESTORIL) 15 MG capsule Take 1 capsule (15 mg total) by mouth at bedtime. TAKE 1 CAPSULE (15 MG TOTAL) BY MOUTH AT BEDTIME AS NEEDED FOR SLEEP. 01/04/22   Inda Coke, PA  torsemide (DEMADEX) 20 MG tablet Take 40 mg by mouth daily. If weight is more than 3 pounds take another 10 mg for every 3 pound gain. 11/22/21 11/22/22  [provider]  TRIAMCINOLONE PO Apply 1 application topically daily as needed (itching).    [provider]  zinc gluconate 50 MG tablet Take 50 mg by mouth daily.    [provider]      Allergies    Other    Review of Systems   Review of Systems  Physical Exam Updated Vital Signs BP 127/76   Pulse 71   Temp 97.9 F (36.6 C)   Resp 18   Ht '5\' 11"'  (1.803 m)   Wt 119.9 kg   SpO2 100%   BMI 36.87 kg/m  Physical Exam Vitals and nursing note reviewed.  Constitutional:      General: He is not in acute distress.    Appearance: Normal appearance.  Eyes:     General: No scleral icterus.    Extraocular Movements: Extraocular movements intact.  Cardiovascular:     Rate and Rhythm: Normal rate.  Pulmonary:     Effort: Pulmonary effort is normal. No respiratory distress.  Musculoskeletal:     Cervical back: Neck supple.     Comments: Tenderness to palpation to left patellar tendon. No obvious deformity, effusion, erythema, or swelling.  Able to flex and extend knee against resistance without difficulty.  No tenderness to palpation noted to left calf.  No unilateral leg swelling.  No increased warmth noted to the joint.  No overlying skin changes.  Skin:    General: Skin is warm and dry.     Findings: No bruising, erythema or rash.   Neurological:     Mental Status: He is alert.  Psychiatric:        Behavior: Behavior normal.     ED Results / Procedures / Treatments   Labs (all labs ordered are listed, but only abnormal results are displayed) Labs Reviewed - No data to display  EKG None  Radiology US Venous Img Lower  Left (DVT Study)  Result Date: 05/21/2022 CLINICAL DATA:  Left leg pain EXAM: LEFT LOWER EXTREMITY VENOUS DOPPLER ULTRASOUND TECHNIQUE: Gray-scale sonography with compression, as well as color and duplex ultrasound, were performed to evaluate the deep venous system(s) from the level of the common femoral vein through the popliteal and proximal calf veins. COMPARISON:  None Available. FINDINGS: VENOUS Normal compressibility of the common femoral, superficial femoral, and popliteal veins, as well as the visualized calf veins. Visualized portions of profunda femoral vein and great saphenous vein unremarkable. No filling defects to suggest DVT on grayscale or color Doppler imaging. Doppler waveforms show normal direction of venous flow, normal respiratory plasticity and response to augmentation. Limited views of the contralateral common femoral vein are unremarkable. OTHER None. Limitations: none IMPRESSION: Negative. Electronically Signed   By: Fidela Salisbury M.D.   On: 05/21/2022 20:03   DG Knee Complete 4 Views Left  Result Date: 05/21/2022 CLINICAL DATA:  Pain EXAM: LEFT KNEE - COMPLETE 4+ VIEW COMPARISON:  None Available. FINDINGS: No fracture or dislocation is seen. There is no significant effusion. Small bony spurs seen in the patella. There are scattered vascular calcifications in the soft tissues. IMPRESSION: No recent fracture or dislocation is seen in the left knee. Degenerative changes with minimal bony spurs are noted. Electronically Signed   By: Elmer Picker M.D.   On: 05/21/2022 19:44    Procedures Procedures    Medications Ordered in ED Medications - No data to display  ED Course/  Medical Decision Making/ A&P                           Medical Decision Making Amount and/or Complexity of Data Reviewed Radiology: ordered.   Pt presents with left leg pain onset last night.  Patient has been walking in a rocking chair for approximately 2 hours and noticed the pain afterwards.  Denies recent injury, trauma, fall.  On her Eliquis currently.  Vital signs stable, patient afebrile, not tachycardic or hypoxic. On exam, pt with tenderness to palpation to left patellar tendon. No obvious deformity, effusion, erythema, or swelling.  Able to flex and extend knee against resistance without difficulty.  No tenderness to palpation noted to left calf.  No unilateral leg swelling.  No acute cardiovascular or respiratory exam findings. Differential diagnosis includes fracture, dislocation, tendinitis, DVT.    Co morbidities that complicate the patient evaluation: Hypertension, CHF  Additional history obtained:  Additional history obtained from Spouse/Significant Other   Imaging: I ordered imaging studies including left DVT ultrasound study, left knee x-ray I independently visualized and interpreted imaging which showed: No acute fracture or dislocation noted to left knee.  No DVT noted on ultrasound study I agree with the radiologist interpretation  Medications:  I ordered medication including ice for symptom management Reevaluation of the patient after these medicines and interventions, I reevaluated the patient and found that they have improved I have reviewed the patients home medicines and have made adjustments as needed    Disposition: Presentation suspicious for left patellar tendinitis.  Doubt fracture, dislocation, DVT, cellulitis, septic arthritis at this time.  Vital signs stable, patient afebrile, no increased warmth or erythema noted to the joint.  Full flexion and extension against resistance on exam.  After consideration of the diagnostic results and the patients  response to treatment, I feel that the patient would benefit from Discharge home.  Patient to be discharged home with knee sleeve.  Patient has a sports medicine specialist, Dr. Georgina Snell, instructed patient to call to set up a follow-up appointment with his sports medicine doctor.  Supportive care measures and strict return precautions discussed with patient at bedside. Pt acknowledges and verbalizes understanding. Pt appears safe for discharge. Follow up as indicated in discharge paperwork.    This chart was dictated  using voice recognition software, Dragon. Despite the best efforts of this provider to proofread and correct errors, errors may still occur which can change documentation meaning.   Final Clinical Impression(s) / ED Diagnoses Final diagnoses:  Acute pain of left knee  Patellar tendonitis of left knee    Rx / DC Orders ED Discharge Orders     None         Shallen Luedke A, PA-C 05/21/22 2052    Fredia Sorrow, MD 06/04/22 1611

## 2022-05-21 NOTE — ED Notes (Signed)
Pt and spouse agreeable with d/c plan as discussed by provider- this nurse has verbally reinforced d/c instructions and provided pt with written copy- no additional questions, concerns, needs verbalized - pt assisted to vehicle via w/c by ED tech with spouse as driver

## 2022-05-21 NOTE — ED Triage Notes (Signed)
Pt via pov from home with left leg pain since last night. Pt denies any injury. Pt able to bear weight on the leg. Leg has no sign of bruising, but it is tender to the touch. Pt alert & oriented, nad noted.

## 2022-05-21 NOTE — Discharge Instructions (Addendum)
It was a pleasure to take care of you today!  Your x-ray was negative for fracture or dislocation today.  Your ultrasound did not show a blood clot in the leg.  You will be given a knee immobilizer, wear it throughout the day, you may remove it at night.  Call your sports medicine doctor to set up a follow-up appoint regarding today's ED visit.  You may take over-the-counter 500 mg Tylenol every 6 hours as needed for pain.  You may place ice to the affected area for up to 15 minutes at a time, ensure to place a barrier between your skin and the ice.  Return to the emergency department for experiencing increasing/worsening inability to walk, fever, redness, worsening symptoms.

## 2022-05-23 ENCOUNTER — Other Ambulatory Visit: Payer: Medicare Other | Admitting: Hospice

## 2022-05-23 DIAGNOSIS — I5032 Chronic diastolic (congestive) heart failure: Secondary | ICD-10-CM

## 2022-05-23 DIAGNOSIS — J849 Interstitial pulmonary disease, unspecified: Secondary | ICD-10-CM

## 2022-05-23 DIAGNOSIS — Z515 Encounter for palliative care: Secondary | ICD-10-CM

## 2022-05-23 DIAGNOSIS — M25562 Pain in left knee: Secondary | ICD-10-CM

## 2022-05-23 DIAGNOSIS — M792 Neuralgia and neuritis, unspecified: Secondary | ICD-10-CM

## 2022-05-23 NOTE — Progress Notes (Signed)
[facility] Pall[facility] Care Consult Note Telephone: [phone]  Fax: [phone]  PATIENT NAME: Maurice Reed DOB: [birth year] MRN: [id number]  PRIMARY CARE PROVIDER:   Inda Coke, [place], [place], [place] [address] [place],  [place] [address]  REFERRING PROVIDER: Inda Coke, [place] Inda Coke, [place] [address] [place],  [place] [address]  RESPONSIBLE PARTY:  Self [phone] - spouse - best to call  Extended Emergency Contact Information Primary Emergency Contact: Larnie, Heart Mobile Phone: [phone] Relation: Spouse Secondary Emergency Contact: Perrin Maltese Mobile Phone: [phone] Relation: Granddaughter Contact Information     Name Relation Home Work Mobile   [place], [place]   [phone]   Bonney Leitz   [phone]       Visit is to build trust and highlight Palliative Medicine as specialized medical care for people living with serious illness, aimed at facilitating better quality of life through symptoms relief, assisting with advance care planning and complex medical decision making.  Maurice Reed is with patient during visit.  This is a follow up visit.  RECOMMENDATIONS/PLAN:   Advance Care Planning/Code Status: Patient is a DO NOT RESUSCITATE  Goals of Care: Goals of care include to maximize quality of life and symptom management.  MOST selections include DO NOT RESUSCITATE, limited additional intervention, IV fluids for defined trial period, antibiotics as indicated, no feeding tube.  Family is open to hospice services in the future.  Symptom management/Plan:  CHF: Continue Torsemide, Spironolactone as ordered. Followed by cardiologist Dr. Bernell List at [facility]. Adhere to salt and fluid limits. Wear compression stockings. Elevate BLE during the day as much as possible to promote circulation. Monitor weight closely and report weight gain of 3 Ibs in a day or 5 Ibs  in a week. Current weight is 262.3 Ib down from 270 Ib 03/20/22.  Routine CBC BMP. HTN: Continue Metoprolol as ordered. Patient keeps a BP log, reported BP is fairly stable in the low 825O to 037C systolic.   Shortness of breath: related to interstitial lung disease. BIPAP at night, oxygen supplementation 3-5 L/Min.  Continue breathing treatments - Ventolin, Combivent as ordered. Followed by Pulmonologist.  Neuropathic pain: Continues with [facility] pain clinic. 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 Effective. Left knee pain: Acute , seen in ED 05/21/22. [place] indicates he pulled a tendon below patella from rocking in his rocking chair. Continue with knee brace. Follow up with Ortho Left shoulder pain:  Stable. Patient is followed by Ortho for cortisone shot every 3 months.   Follow up: Palliative care will continue to follow for complex medical decision making, advance care planning, and clarification of goals. Return 6 weeks or prn. Encouraged to call provider sooner with any concerns.  CHIEF COMPLAINT: Palliative follow up  HISTORY OF PRESENT ILLNESS:  Maurice Reed a 81 y.o. 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, psoriasis-on [place]la followed by dermatologist, arthritis.  Recent history of COVID-19 viral infection. History obtained from review of EMR, discussion with primary team, family and/or patient. Records reviewed and summarized above. All 10 point systems reviewed and are negative except as documented in history of present illness above  Review and summarization of Epic records shows history from other than patient.   Palliative Care was  asked to follow this patient o help address complex decision making in the context of advance care  planning and goals of care clarification.     PERTINENT MEDICATIONS:  Outpatient Encounter Medications as of 05/23/2022  Medication Sig   Apremilast (OTEZLA) 30 MG TABS Take 30 mg by mouth 2 (two) times daily.   Ascorbic Acid (VITAMIN C) 500 MG CAPS Take 500 mg by mouth daily.   Atropine Sulfate-NaCl 0.01-0.9 % SOLN Place 1 drop into the left eye at bedtime.   bimatoprost (LUMIGAN) 0.01 % SOLN Place 1 drop into the right eye at bedtime.   Cholecalciferol (VITAMIN D3) 3000 units TABS Take 3,000 Units by mouth daily.   clobetasol (TEMOVATE) 0.05 % external solution APPLY TO AFFECTED AREA(S)  TOPICALLY ON SCALP 1 TO 2  TIMES DAILY AS NEEDED (Patient taking differently: 1 application 2 (two) times daily as needed (for scalp).)   desonide (DESOWEN) 0.05 % lotion Apply 1 application topically daily as needed (itching).   docusate sodium (COLACE) 100 MG capsule Take 100 mg by mouth 2 (two) times daily.   doxycycline (VIBRA-TABS) 100 MG tablet Take 1 tablet (100 mg total) by mouth 2 (two) times daily.   ELIQUIS 5 MG TABS tablet TAKE 1 TABLET BY MOUTH TWICE A DAY   erythromycin ophthalmic ointment Place 1 application into both eyes at bedtime.   ipratropium-albuterol (DUONEB) 0.5-2.5 (3) MG/3ML SOLN Take 3 mLs by nebulization every 6 (six) hours as needed. (Patient taking differently: Take 3 mLs by nebulization every 6 (six) hours as needed (wheezing).)   ketorolac (ACULAR) 0.5 % ophthalmic solution Place 1 drop into both eyes 4 (four) times daily as needed (irritation).   magnesium oxide (MAG-OX) 400 MG tablet TAKE 1 TABLET BY MOUTH TWICE A DAY (Patient taking differently: Take 400 mg by mouth 2 (two) times daily.)   metoprolol succinate (TOPROL-XL) 25 MG 24 hr tablet Take 1 tablet (25 mg total) by mouth daily.   mometasone (NASONEX) 50 MCG/ACT nasal spray Place 2 sprays into the nose daily. (Patient taking differently: Place 2 sprays into the nose daily as needed (congestion).)   NARCAN 4 MG/0.1ML  LIQD nasal spray kit 1 spray once as needed (overdose).   nystatin ointment (MYCOSTATIN) Apply 1 application topically 2 (two) times daily. (Patient taking differently: Apply 1 application topically daily as needed (skinn irritation).)   oxyCODONE (OXY IR/ROXICODONE) 5 MG immediate release tablet Take 5 mg by mouth 3 (three) times daily as needed for severe pain. for pain   potassium chloride (KLOR-CON) 10 MEQ tablet Take 10 mEq by mouth daily.   prednisoLONE acetate (PRED FORTE) 1 % ophthalmic suspension Place 1 drop into the left eye at bedtime.   pregabalin (LYRICA) 50 MG capsule Take 50 mg by mouth 2 (two) times daily.   tadalafil (CIALIS) 20 MG tablet TAKE 1/2 TO 1 TABLET BY MOUTH EVERY OTHER DAY AS NEEDED FOR ERECTILE DYSFUNCTION   tamsulosin (FLOMAX) 0.4 MG CAPS capsule Take 1 capsule by mouth at bedtime   temazepam (RESTORIL) 15 MG capsule Take 1 capsule (15 mg total) by mouth at bedtime. TAKE 1 CAPSULE (15 MG TOTAL) BY MOUTH AT BEDTIME AS NEEDED FOR SLEEP.   torsemide (DEMADEX) 20 MG tablet Take 40 mg by mouth daily. If weight is more than 3 pounds take another 10 mg for every 3 pound gain.   TRIAMCINOLONE PO Apply 1 application topically daily as needed (itching).   zinc gluconate 50 MG tablet Take 50 mg by mouth  daily.   No facility-administered encounter medications on file as of 05/23/2022.    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 More than 50% of the time in this consultation was spent on counseling and coordinating communication   Thank you for the opportunity to participate in the care of YAN PANKRATZ Please call our office at [phone] if we can be of additional assistance.  Note: Portions of this note were generated with Lobbyist. Dictation errors may occur despite best attempts at proofreading.  Teodoro Spray, NP

## 2022-05-31 ENCOUNTER — Telehealth: Payer: Self-pay | Admitting: Physician Assistant

## 2022-05-31 DIAGNOSIS — J849 Interstitial pulmonary disease, unspecified: Secondary | ICD-10-CM

## 2022-05-31 DIAGNOSIS — Z86711 Personal history of pulmonary embolism: Secondary | ICD-10-CM

## 2022-05-31 NOTE — Telephone Encounter (Signed)
..  Reason for Referral Request:  Want to change providers  Has Patient been seen by PCP for this complaint? yes  No, Please schedule patient for appointment for complaint.  Yes, Please find out following information:  Reason: Want to change to another provider  Referral to which Specialty:Pulmonary  Preferred office/ provider:  [facility]  Please fax referral to [phone]

## 2022-06-07 ENCOUNTER — Other Ambulatory Visit: Payer: Self-pay | Admitting: Physician Assistant

## 2022-06-07 DIAGNOSIS — K219 Gastro-esophageal reflux disease without esophagitis: Secondary | ICD-10-CM

## 2022-06-08 NOTE — Telephone Encounter (Signed)
Please ask his wife to clarify with cardiology about the potassium supplement as they are managing all of his blood pressure medications and diuretics and I feel as though it would best be managed by them.

## 2022-06-08 NOTE — Telephone Encounter (Signed)
Samantha please clarify what Potassium patient is suppose to be taking?

## 2022-06-09 NOTE — Telephone Encounter (Signed)
Spoke to [place] pt's wife told her received Rx refill request for pt Potassium and Max-OX. [name] said need to contact Cardiology regarding Potassium since they are monitoring blood pressure and fluid. [name] asked who sent request, cause she received notice about a Rx filled at [facility]. Told her it was from [facility]. Also the Rx for Potassium is not the same that Cardiology has him taking. [name] said she will contact Cardiology.

## 2022-06-14 ENCOUNTER — Ambulatory Visit: Payer: Medicare Other | Admitting: Pulmonary Disease

## 2022-06-14 ENCOUNTER — Encounter: Payer: Self-pay | Admitting: Pulmonary Disease

## 2022-06-14 DIAGNOSIS — G4733 Obstructive sleep apnea (adult) (pediatric): Secondary | ICD-10-CM | POA: Diagnosis not present

## 2022-06-14 DIAGNOSIS — J849 Interstitial pulmonary disease, unspecified: Secondary | ICD-10-CM

## 2022-06-14 DIAGNOSIS — J9611 Chronic respiratory failure with hypoxia: Secondary | ICD-10-CM | POA: Diagnosis not present

## 2022-06-14 NOTE — Progress Notes (Signed)
   Subjective:    Patient ID: Maurice Reed, male    DOB: [birth year], 81 y.o.   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  He worked in [employer] all his life.  He has a history of severe pneumonia in 2006 He smoked about 2 packs/day before he quit in his 41s, more than 50 pack years.   PMH -  DVT/PE in 2002 , chr atrial fibrillation and is maintained on Eliquis, - last flare of CHF was in 2015.   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 Arrives in a wheelchair accompanied by his daughter. Still living independently at home with his wife, but is increasingly becoming wheelchair dependent, gets short of breath on minimal activities. Torsemide has been increased to 20 mg twice daily by his cardiologist, keeps fluid in check. I reviewed [facility] cardiology consultation note and palliative care home visit, DNR has been issued and he has a signed MOST form on file Weight is actually decreased from 281 to 265 pounds.  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 Features of asbestosis are NOT  identified.    01/2018 titration >> bipap 18/12    11/2017 spiro ratio 86, FEV 1 45%, FVC 39 % , severe restriction ABG 12/2017 7.42/45/82/ 97% RA    PFT 02/2019 no airway obstruction, ratio 86, FEV1 60%, F VC 50%, no bronchodilator response, TLC 48%, DLCO 66% , corrects for alveolar volume-moderate intraparenchymal restriction    Echo 07/2018 RVSP 57   09/17/2019-CT chest high-res-calcified pleural plaques bilaterally, compatible with asbestosis related pleural disease, ILD " indeterminate"  for UIP, dilatation of pulmonary trunk concerning for associated pulmonary arterial hypertension      Review of Systems neg for any significant sore throat, dysphagia, itching, sneezing, nasal congestion or excess/ purulent secretions, fever, chills, sweats, unintended wt loss, pleuritic or exertional cp, hempoptysis, orthopnea pnd or change in chronic leg swelling. Also denies presyncope, palpitations, heartburn, abdominal pain, nausea, vomiting, diarrhea or change in bowel or urinary habits, dysuria,hematuria, rash, arthralgias, visual complaints, headache, numbness weakness or ataxia.     Objective:   Physical Exam  Gen. Pleasant, obese, in no distress, normal affect ENT - no pallor,icterus, no post nasal drip, class 2-3 airway Neck: No JVD, no thyromegaly, no carotid bruits Lungs: no use of accessory muscles, no dullness to percussion, bibasal fine dry rales no rhonchi  Cardiovascular: Rhythm regular, heart sounds  normal, no murmurs or gallops, no peripheral edema Abdomen: soft and non-tender, no hepatosplenomegaly, BS normal. Musculoskeletal: No deformities, no cyanosis or clubbing Neuro:  alert, non focal, no tremors       Assessment & Plan:

## 2022-06-14 NOTE — Assessment & Plan Note (Signed)
BiPAP download was reviewed on 16/10 cm with good control of events, excellent compliance and large leak.  He is very compliant and BiPAP is only helped improve his daytime somnolence and fatigue

## 2022-06-14 NOTE — Assessment & Plan Note (Signed)
Oxygen requirements appear stable from 2 years ago between  3 to 5 L but clearly his functional status is declining.  He is more deconditioned and is becoming increasingly wheelchair dependent.  He was not interested in center-based rehab doubt would be a good candidate for home-based rehab. He is already established with outpatient palliative care

## 2022-06-14 NOTE — Assessment & Plan Note (Addendum)
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  Pleural plaques raises the question of asbestosis but there is no significant history of asbestos exposure he would need another high-resolution CT chest to better evaluate.  I discussed medications with him and his daughter including side effect profile.  He reiterated that he is more interested in quality of life rather than prolonging life.  He is DNR issued in a MOST form signed.  He would certainly would not want side effects associated with antifibrotic's, hence we decided to hold off on further imaging

## 2022-06-14 NOTE — Patient Instructions (Signed)
BiPAP is working well 

## 2022-06-20 ENCOUNTER — Ambulatory Visit: Payer: Medicare Other

## 2022-06-21 ENCOUNTER — Telehealth: Payer: Self-pay | Admitting: Physician Assistant

## 2022-06-21 NOTE — Telephone Encounter (Signed)
Caller states patient is out of medication, has missed 3 doses.  Caller request PCP team to write script to get pt "to his appointment on 07/31."  Caller states PCP team has issued this medication "since [patient] has been with [facility]". - - FO Rep could not confirm and explained to caller, caller disagrees.    LAST APPOINTMENT DATE:   12/10/21 HFU with PCP  NEXT APPOINTMENT DATE: N/A  MEDICATION: potassium chloride (KLOR-CON) 10 MEQ tablet [[id number]]   Is the patient out of medication?  Yes, 3 missed doses  PHARMACY: [facility] [facility], [place] - [address]  [address] [place] [address]  Phone:  [phone]  Fax:  [phone]  DEA #:  [id number]

## 2022-06-22 MED ORDER — POTASSIUM CHLORIDE ER 10 MEQ PO TBCR: EXTENDED_RELEASE_TABLET | ORAL | 0 refills | Status: AC

## 2022-06-22 NOTE — Telephone Encounter (Signed)
Spoke to [place] pt's wife, told her I spoke to you about his Potassium and you were told to call Cardiology. [name] said she has tried Cardiology 3 times and has not been able to speak to anyone. She said pt just needs enough till appt on 7/31 with Cardiology. Told her okay, clarified directions for Potassium sent to pharmacy. [name] verbalized understanding.

## 2022-07-11 ENCOUNTER — Other Ambulatory Visit: Payer: Self-pay | Admitting: Physician Assistant

## 2022-07-11 ENCOUNTER — Telehealth: Payer: Self-pay | Admitting: Physician Assistant

## 2022-07-11 NOTE — Telephone Encounter (Signed)
Refill request from the pharmacy for Temazepam 15 mg capsule.

## 2022-07-11 NOTE — Telephone Encounter (Signed)
Copied from CRM [id number]. Topic: Medicare AWV >> Jul 11, 2022  3:15 PM Payton Doughty wrote: Reason for CRM: Left message for patient to schedule Annual Wellness Visit.  Please schedule with Nurse Health Advisor Lanier Ensign, RN at [facility]. This appt can be telephone or office visit. Please call [phone] ask for [facility]

## 2022-07-13 ENCOUNTER — Telehealth: Payer: Self-pay | Admitting: Physician Assistant

## 2022-07-13 NOTE — Telephone Encounter (Signed)
Patient's wife [name] called to [name] know that Patient fell after loosing consciousness and is Hospitalized at [facility] SICU. [name] states Patient broke 3 ribs (9,10 &11) and fractured Spine T2. Caller states Patient's Heart Specialist is following Patient's care at [facility]

## 2022-07-13 NOTE — Telephone Encounter (Signed)
FYI, see message from pt's wife.

## 2022-07-14 ENCOUNTER — Ambulatory Visit: Payer: Medicare Other | Admitting: Family Medicine

## 2022-07-18 NOTE — Telephone Encounter (Signed)
Patient is requesting call back.  Would like to see if Maurice Reed could review OV notes of patients current stay at [facility].   I have informed spouse to schedule appointment with [name] once patient is discharged.  Spouse can be reached at [phone].

## 2022-07-18 NOTE — Telephone Encounter (Signed)
Please see message. °

## 2022-07-19 NOTE — Telephone Encounter (Signed)
Please see Samantha's message and schedule.

## 2022-07-19 NOTE — Telephone Encounter (Signed)
Left spouse vm in regard to schedule patient a vv or in person visit with [name].

## 2022-07-27 NOTE — Telephone Encounter (Signed)
FYI, see message. 

## 2022-07-27 NOTE — Telephone Encounter (Signed)
Patient spouse states Patient has been discharged from [facility] and is now at [facility] in [place] for approx. 1-2 weeks. Spouse states she will call back later to schedule a Virtual Visit.

## 2022-08-02 ENCOUNTER — Telehealth: Payer: Self-pay | Admitting: Physician Assistant

## 2022-08-02 NOTE — Telephone Encounter (Signed)
Left detailed message on [facility] voicemail for permission for palliative care to continue for patient at home. Any questions please call the office. Also left detailed message on [employer], permission to continue palliative care for patient at home. Any questions please call the office.

## 2022-08-02 NOTE — Telephone Encounter (Signed)
Caller states: -Patient is set to be discharged from skilled nursing facility soon  Caller requests: - Permission for palliative care to continue for patient at his home   Permission can be given via phone at [phone] option #2.

## 2022-08-09 ENCOUNTER — Encounter: Payer: Self-pay | Admitting: Physician Assistant

## 2022-08-09 ENCOUNTER — Ambulatory Visit (INDEPENDENT_AMBULATORY_CARE_PROVIDER_SITE_OTHER): Payer: Medicare Other | Admitting: Physician Assistant

## 2022-08-09 ENCOUNTER — Telehealth: Payer: Self-pay | Admitting: Physician Assistant

## 2022-08-09 VITALS — BP 110/60 | HR 88 | Temp 98.2°F | Ht 70.5 in | Wt 258.5 lb

## 2022-08-09 DIAGNOSIS — R55 Syncope and collapse: Secondary | ICD-10-CM

## 2022-08-09 DIAGNOSIS — R000 Tachycardia, unspecified: Secondary | ICD-10-CM | POA: Diagnosis not present

## 2022-08-09 DIAGNOSIS — R710 Precipitous drop in hematocrit: Secondary | ICD-10-CM

## 2022-08-09 NOTE — Patient Instructions (Signed)
It was great to see you!  I will be in touch with your blood work results  Please reach out to your cardiologist  Take care,  Jarold Motto PA-C

## 2022-08-09 NOTE — Telephone Encounter (Signed)
Called Enid Derry with [facility], verbal orders given for Skilled Nursing and Physical therapy okay per Jarold Motto for patient. Enid Derry verbalized understanding.

## 2022-08-09 NOTE — Telephone Encounter (Signed)
 ..  Home Health Verbal Orders  Agency:  [facility]  Caller:  Enid Derry [phone]  Contact and title  Requesting OT/ PT/ Skilled nursing/ Social Work/ Speech:   Skilled Nursing  Physical Therapy  Reason for Request:   Not given.   Frequency:   Not given

## 2022-08-09 NOTE — Progress Notes (Signed)
Maurice Reed is a 81 y.o. male here for a follow up of a pre-existing problem.  History of Present Illness:   Chief Complaint  Patient presents with   Hospitalization Follow-up    HPI  Syncope  Passed out on 07/12/22 in bathroom. Got admitted to [facility]. Had R rib fracture 9-11 and T2 spine fracture. Went to ICU given his high risk for lung collapse and PNA. Discharged from hospital on 8/22. Was admitted to [facility] in [place]. Was there for 8 days. Did PT/OT there.   Has home health orders currently for home health nurse -- once a week. PT two times per week.  Has not started this yet.  He has not had further syncopal episodes.  Decreased Hgb Baseline Hgb from the past 3 years has been around 13.5-14. When admitted to [facility] Hgb got down to 11.4. Per his wife, they considered a transfusion but did not do this. Last cologuard was June 2021.  Denies any overt rectal bleeding.    Tachycardia He is concerned that any small amount of activity, even standing, is causing elevation of heart rate.  He has a pulse ox that he wears regularly.  His heart rate can get up to 125 at times.  He does not have significant decrease oxygen when he gets these numbers.  He does report that he is using about 5 to 6 L of oxygen per day and prior to hospitalization he was on 4 to 5 L of oxygen per day.  He is working on establishing care with a new pulmonologist in [place] but has not had this appointment yet.  His wife has messaged his cardiologist regarding his fluctuations in his heart rate but she has not heard back from them yet.  He remains compliant with his Eliquis.  He saw his cardiologist most recently on 08/01/2022 we reviewed this note.  Per his note he was under diuresed in the hospital and then had to be aggressively diuresed in the nursing facility.  His wife reports that his legs were more edematous than they have she is ever seen them.  This has resolved.  Most recent BUN was 28 and Cre was 1.0.  He reports  that he is drinking about 2 L/day.  Denies chest pain.   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   Diverticula, bladder    Erectile dysfunction    GERD (gastroesophageal reflux disease)    Kidney stones    Obesity    OSA (obstructive sleep apnea)    Pulmonary embolism ([facility])      Social History   Tobacco Use   Smoking status: Former    Packs/day: 3.00    Years: 25.00    Total pack years: 75.00    Types: Cigarettes    Quit date: 11/28/1984    Years since quitting: 37.7   Smokeless tobacco: Never  Vaping Use   Vaping Use: Never used  Substance Use Topics   Alcohol use: Yes    Alcohol/week: 1.0 standard drink of alcohol    Types: 1 Glasses of wine per week    Comment: at times   Drug use: Not Currently    Types: Marijuana    Comment: Gummies     Past Surgical History:  Procedure Laterality Date   [place]  2003   TONSILLECTOMY AND ADENOIDECTOMY  2202   UMBILICAL HERNIA REPAIR  1984    Family History  Problem Relation Age of Onset   Arthritis Mother    Hearing loss Mother    Hypertension Mother    Heart disease Mother    Stroke Mother    Arthritis Father    Hearing loss Father    Heart disease Father    Hypertension Father    Heart attack Father    Kidney disease Father    Stroke Father     Allergies  Allergen Reactions   Other Itching    Cats: watery eyes, itching, sneezing    Current Medications:   Current Outpatient Medications:    Albuterol Sulfate (PROAIR RESPICLICK) 542 (90 Base) MCG/ACT AEPB, Inhale 2 puffs into the lungs every 6 (six) hours as needed., Disp: , Rfl:    Apremilast (OTEZLA) 30 MG TABS, Take 30 mg by mouth 2 (two) times daily., Disp: , Rfl:    Ascorbic Acid (VITAMIN C) 500 MG CAPS, Take 500 mg by mouth daily., Disp: , Rfl:    Atropine Sulfate-NaCl 0.01-0.9 % SOLN, Place 1 drop into  the left eye at bedtime., Disp: , Rfl:    augmented betamethasone dipropionate (DIPROLENE-AF) 0.05 % cream, Apply topically., Disp: , Rfl:    bimatoprost (LUMIGAN) 0.01 % SOLN, Place 1 drop into the right eye at bedtime., Disp: , Rfl:    budesonide-formoterol (SYMBICORT) 160-4.5 MCG/ACT inhaler, Inhale 2 puffs into the lungs 2 (two) times daily., Disp: , Rfl:    Cholecalciferol (VITAMIN D3) 3000 units TABS, Take 3,000 Units by mouth daily., Disp: , Rfl:    clobetasol (TEMOVATE) 0.05 % external solution, APPLY TO AFFECTED AREA(S)  TOPICALLY ON SCALP 1 TO 2  TIMES DAILY AS NEEDED (Patient taking differently: 1 application  2 (two) times daily as needed (for scalp).), Disp: 150 mL, Rfl: 3   desonide (DESOWEN) 0.05 % lotion, Apply 1 application topically daily as needed (itching)., Disp: , Rfl:    docusate sodium (COLACE) 100 MG capsule, Take 100 mg by mouth 2 (two) times daily., Disp: , Rfl:    doxycycline (VIBRA-TABS) 100 MG tablet, Take 1 tablet (100 mg total) by mouth 2 (two) times daily., Disp: 180 tablet, Rfl: 1   ELIQUIS 5 MG TABS tablet, TAKE 1 TABLET BY MOUTH TWICE A DAY, Disp: 180 tablet, Rfl: 1   erythromycin ophthalmic ointment, Place 1 application into both eyes at bedtime., Disp: , Rfl: 1   fluticasone (CUTIVATE) 0.05 % cream, Apply 1 Application topically daily., Disp: , Rfl:    ipratropium-albuterol (DUONEB) 0.5-2.5 (3) MG/3ML SOLN, Take 3 mLs by nebulization every 6 (six) hours as needed. (Patient taking differently: Take 3 mLs by nebulization every 6 (six) hours as needed (wheezing).), Disp: 360 mL, Rfl: 3   ketorolac (ACULAR) 0.5 % ophthalmic solution, Place 1 drop into both eyes 4 (four) times daily as needed (irritation)., Disp: , Rfl:    magnesium oxide (MAG-OX) 400 (240 Mg) MG tablet, TAKE 1 TABLET BY MOUTH TWICE A DAY, Disp: 180 tablet, Rfl: 2   metoprolol succinate (TOPROL-XL) 25 MG 24 hr tablet, Take 1 tablet (25 mg total) by mouth daily., Disp: 90 tablet, Rfl: 3   mometasone  (NASONEX) 50 MCG/ACT nasal spray, Place 2 sprays into the nose daily. (Patient taking differently: Place 2 sprays into the nose daily as needed (congestion).), Disp: 51 g, Rfl: 2   NARCAN 4 MG/0.1ML LIQD nasal spray kit, 1 spray once  as needed (overdose)., Disp: , Rfl: 0   nystatin ointment (MYCOSTATIN), Apply 1 application topically 2 (two) times daily. (Patient taking differently: Apply 1 application  topically daily as needed (skinn irritation).), Disp: 30 g, Rfl: 1   oxyCODONE (OXY IR/ROXICODONE) 5 MG immediate release tablet, Take 5 mg by mouth 3 (three) times daily as needed for severe pain. for pain, Disp: , Rfl: 0   polyethylene glycol (MIRALAX / GLYCOLAX) 17 g packet, Take by mouth., Disp: , Rfl:    potassium chloride (KLOR-CON) 10 MEQ tablet, Take 2 tablets in the AM and one in the PM., Disp: 30 tablet, Rfl: 0   prednisoLONE acetate (PRED FORTE) 1 % ophthalmic suspension, Place 1 drop into the left eye at bedtime., Disp: , Rfl:    pregabalin (LYRICA) 50 MG capsule, Take 50 mg by mouth 2 (two) times daily., Disp: , Rfl:    spironolactone (ALDACTONE) 25 MG tablet, Take 12.5 mg by mouth daily., Disp: , Rfl:    tamsulosin (FLOMAX) 0.4 MG CAPS capsule, Take 1 capsule by mouth at bedtime, Disp: 80 capsule, Rfl: 3   temazepam (RESTORIL) 15 MG capsule, TAKE 1 CAPSULE BY MOUTH AT BEDTIME AS NEEDED FOR SLEEP, Disp: 90 capsule, Rfl: 0   torsemide (DEMADEX) 20 MG tablet, Take 40 mg by mouth 2 (two) times daily. If weight is more than 3 pounds take another 10 mg for every 3 pound gain., Disp: , Rfl:    TRIAMCINOLONE PO, Apply 1 application topically daily as needed (itching)., Disp: , Rfl:    zinc gluconate 50 MG tablet, Take 50 mg by mouth daily., Disp: , Rfl:    tadalafil (CIALIS) 20 MG tablet, TAKE 1/2 TO 1 TABLET BY MOUTH EVERY OTHER DAY AS NEEDED FOR ERECTILE DYSFUNCTION (Patient not taking: Reported on 06/14/2022), Disp: 5 tablet, Rfl: 11   Review of Systems:   ROS Negative unless otherwise  specified per HPI.   Vitals:   Vitals:   08/09/22 1403  BP: 110/60  Pulse: 88  Temp: 98.2 F (36.8 C)  TempSrc: Temporal  SpO2: 95%  Weight: 258 lb 8 oz (117.3 kg)  Height: 5' 10.5" (1.791 m)     Body mass index is 36.57 kg/m.  Physical Exam:   Physical Exam Vitals and nursing note reviewed.  Constitutional:      General: He is not in acute distress.    Appearance: He is well-developed. He is not ill-appearing or toxic-appearing.  Cardiovascular:     Rate and Rhythm: Normal rate. Rhythm irregular.     Pulses: Normal pulses.     Heart sounds: Normal heart sounds, S1 normal and S2 normal.  Pulmonary:     Effort: Pulmonary effort is normal.     Breath sounds: Normal breath sounds.  Skin:    General: Skin is warm and dry.  Neurological:     Mental Status: He is alert.     GCS: GCS eye subscore is 4. GCS verbal subscore is 5. GCS motor subscore is 6.  Psychiatric:        Speech: Speech normal.        Behavior: Behavior normal. Behavior is cooperative.     Assessment and Plan:   Syncope, unspecified syncope type Extensively reviewed patient's hospital records from [facility] Overall seems to be doing well Has follow-up scheduled soon with cardiology Planning to start twice weekly physical therapy in home  Tachycardia Reviewed briefly with Dr. Dimas Chyle Suspect possible dehydration due to recent need for aggressive  diuresis and current weight is below his usual dry weight Work on fluids and will recheck kidney function today Recommend follow-up with cardiologist if persists Very low likelihood of PE given compliance with Eliquis  Decreased hemoglobin Unclear etiology of this We will recheck today and if remains concerning will likely do iFob testing  Time spent with patient today was 60 minutes which consisted of chart review, discussing diagnosis, work up, treatment answering questions and documentation.   Inda Coke, PA-C

## 2022-08-10 ENCOUNTER — Telehealth: Payer: Self-pay | Admitting: Physician Assistant

## 2022-08-10 LAB — CBC WITH DIFFERENTIAL/PLATELET
Basophils Absolute: 0.1 10*3/uL (ref 0.0–0.1)
Basophils Relative: 0.6 % (ref 0.0–3.0)
Eosinophils Absolute: 0.1 10*3/uL (ref 0.0–0.7)
Eosinophils Relative: 0.9 % (ref 0.0–5.0)
HCT: 39.6 % (ref 39.0–52.0)
Hemoglobin: 13.3 g/dL (ref 13.0–17.0)
Lymphocytes Relative: 20.8 % (ref 12.0–46.0)
Lymphs Abs: 1.8 10*3/uL (ref 0.7–4.0)
MCHC: 33.5 g/dL (ref 30.0–36.0)
MCV: 104.4 fl — ABNORMAL HIGH (ref 78.0–100.0)
Monocytes Absolute: 1.1 10*3/uL — ABNORMAL HIGH (ref 0.1–1.0)
Monocytes Relative: 12.1 % — ABNORMAL HIGH (ref 3.0–12.0)
Neutro Abs: 5.7 10*3/uL (ref 1.4–7.7)
Neutrophils Relative %: 65.6 % (ref 43.0–77.0)
Platelets: 199 10*3/uL (ref 150.0–400.0)
RBC: 3.79 Mil/uL — ABNORMAL LOW (ref 4.22–5.81)
RDW: 13.8 % (ref 11.5–15.5)
WBC: 8.7 10*3/uL (ref 4.0–10.5)

## 2022-08-10 LAB — COMPREHENSIVE METABOLIC PANEL
ALT: 14 U/L (ref 0–53)
AST: 19 U/L (ref 0–37)
Albumin: 3.7 g/dL (ref 3.5–5.2)
Alkaline Phosphatase: 122 U/L — ABNORMAL HIGH (ref 39–117)
BUN: 22 mg/dL (ref 6–23)
CO2: 36 mEq/L — ABNORMAL HIGH (ref 19–32)
Calcium: 10.3 mg/dL (ref 8.4–10.5)
Chloride: 95 mEq/L — ABNORMAL LOW (ref 96–112)
Creatinine, Ser: 1.2 mg/dL (ref 0.40–1.50)
GFR: 56.73 mL/min — ABNORMAL LOW (ref >60.00)
Glucose, Bld: 69 mg/dL — ABNORMAL LOW (ref 70–99)
Potassium: 3.8 mEq/L (ref 3.5–5.1)
Sodium: 139 mEq/L (ref 135–145)
Total Bilirubin: 0.8 mg/dL (ref 0.2–1.2)
Total Protein: 7.1 g/dL (ref 6.0–8.3)

## 2022-08-10 LAB — TSH: TSH: 1.41 u[IU]/mL (ref 0.35–5.50)

## 2022-08-10 NOTE — Telephone Encounter (Signed)
Called [facility] and spoke to [place], verbal orders given for patient to have Physical Therapy Frequency 2x/week 1, 0x/week 1 (patient on vacation), 2x/week 1, 1x/week 1, okay per [name]. Antonio verbalized understanding.

## 2022-08-10 NOTE — Telephone Encounter (Signed)
  Agency:   [facility]  Caller: Antonio Baes, Montez Hageman, Physical Therapist   Requesting OT/ PT/ Skilled nursing/ Social Work/ Speech:   PT  Reason for Request:   Needed after evaluation  Frequency:   Frequency 2x/week 1, 0x/week 1 (patient on vacation), 2x/week 1, 1x/week 1  HH needs F2F w/in last 30 days

## 2022-08-15 ENCOUNTER — Other Ambulatory Visit: Payer: Medicare Other | Admitting: *Deleted

## 2022-08-15 ENCOUNTER — Other Ambulatory Visit: Payer: Medicare Other

## 2022-08-15 ENCOUNTER — Other Ambulatory Visit: Payer: Medicare Other | Admitting: Hospice

## 2022-08-15 VITALS — BP 120/62 | HR 79 | Temp 98.1°F | Resp 20 | Ht 70.5 in | Wt 257.1 lb

## 2022-08-15 DIAGNOSIS — Z515 Encounter for palliative care: Secondary | ICD-10-CM

## 2022-08-15 NOTE — Progress Notes (Signed)
COMMUNITY PALLIATIVE CARE SW NOTE  PATIENT NAME: Maurice Reed DOB: 1941-02-05 MRN: [id number]  PRIMARY CARE PROVIDER: Jarold Motto, PA  RESPONSIBLE PARTY:  Acct ID - Guarantor Home Phone Work Phone Relationship Acct Type  [id number] KILO, ESHELMAN [phone]  Self P/F     [address] [place], [place] [address]   SOCIAL WORK VISIT  PC SW and RN-M. Dimas Aguas completed a face-to-face visit with patient at his home, where he was present with his wife-Brenda. Patient was joined the team in the kitchen. He ambulates with a rollator walker. He was wearing o2 at 4L continuous via nasal canula. Patient is currently receiving PT through [facility]  2x a week. Patient will be going to the beach for a week next week. Patient seemed to be alert and oriented x3 and no cognitive deficits were noted. Patient report that he seems to be doing better and "getting stronger" since returning home. Patient is independent for ADL's. His wife provide standby assistance with showers for safety. Patient report that he has periods of dizziness that he describes as an  "uneven feeling". Patient heart rate increases with any activity and he has difficulty catching  his breath. Patient also reported having soreness around his side and lower back from his fall. His appetite is good. He does have difficulty swallowing large pills. Patient report his appetite and fluid intake was good. Patient had no bladder or bowel issues. He is sleeping well, but he wake up 2-3x a night. He does have swelling in his legs and feet and he wears compression stockings. 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 He also take a diuretic. SW provided education regarding Armed forces logistics/support/administrative officer as patient desires a wheelchair.  Patient was born and raised in [place], [place]. Patient retired from the [employer]. He has been married to his third wife for 38 years. He has eight children. He has no Research officer, trade union. Patient's wife  serve as  his POA/HCPOA. He has a MOST form and DNR. Patient and his wife remain open to ongoing visit and support by the palliative care team. SW left a folder with the family with contact information and literature on the program.   .  CODE STATUS: DNR ADVANCED DIRECTIVES: Yes MOST FORM COMPLETE:  Yes HOSPICE EDUCATION PROVIDED: No  Duration and documentation: 60 minutes   Laveyah Oriol, LCSW

## 2022-08-17 NOTE — Progress Notes (Signed)
[facility] RN NOTE  PATIENT NAME: JB DULWORTH DOB: [birth year] MRN: [id number]  PRIMARY CARE PROVIDER: Jarold Motto, PA  RESPONSIBLE PARTY: Thornton Papas (spouse) Acct ID - Guarantor Home Phone Work Phone Relationship Acct Type  [id number] JERRED, ZAREMBA [phone]  Self P/F     [address] [place], [place] [address]    RN/SW team completed follow up palliative care visit with patient and his wife, [name] in their home. Patient had a fall in the bathroom on 07/12/2022 and sustained fractures of multiple ribs on his right side, R 9-11 and a T2 compression fracture. He was discharged from [facility] and was then discharged to [facility] for Rehab. He is now back in his home.  Pain: Reports mild pain in lower back and right side from rib fractures and compression fracture. Oxycodone is effective in taking the edge off. 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 Lyrica effective.  Respiratory: Oxygen dependent. 4L. Stays around 98%. Sometimes drops to the 80s with activity. Wife reports he is having more difficulty catching his breath with any activity, which is a new finding since the fall. If he eats too much, this will also affect his breathing.  Cardiovascular: Has dx of CHF. Edema noted in bilateral lower extremities. Continues on Torsemide. Weighs daily. Wt today 257.1 lbs. Wears compression stockings. Hx of Afib. Pulse will increase with exertion into the lower 100s. Remains on Eliquis.  Mobility: In home PT through [facility]. PT came Thursday and completed the evaluation. Supposed to start this week 2x/week. He is going to beach next week with his wife. Following week 2x/week then 1x/week first week of October. Ambulates with a rollator walker. Bathes self but wife present for safety purposes. Wife feels that he is getting stronger and back to his regular routine. When in the hospital, he was so weak that he couldn't stand.  Appetite: Good appetite. Swallowing ok  except for big pills. Wind pipe is offset a little per patient.   GI/GU: No bowel or urinary issues. Takes Miralax every morning.  Insomnia: Reports sleeping decent but wake up a lot sometimes to go to BR 1-3 times night. Continues on Restoril.  Skin: is on Doxycyline for Rosacae  Goals of care: Wife is POA. She is unable to find his DNR to keep in the home. 2 copies left in the home by LCSW. Encouraged to take a copy with them to the beach. They are leaving Saturday and will stay x 1 week. Goal is to remain in the home as long as possible.   CODE STATUS: DNR ADVANCED DIRECTIVES: Y MOST FORM: yes PPS: 50%   PHYSICAL EXAM:   VITALS: Today's Vitals   08/05/22 1240  BP: 120/62  Pulse: 79  Resp: 20  Temp: 98.1 F (36.7 C)  TempSrc: Temporal  SpO2: 98%  Weight: 258 lb (117 kg)  Height: 5' 10.5" (1.791 m)  PainSc: 3   PainLoc: Back    LUNGS: clear to auscultation  CARDIAC: Cor irreg, irreg RRR EXTREMITIES: Trace edema to bilateral legs/feet (wears compression hose which helps) SKIN:  Exposed skin is dry and intact   NEURO:  Alert and oriented x 3, forgetful, pleasant mood, ambulatory w/rollator walker   Candiss Norse, RN BSN

## 2022-08-31 ENCOUNTER — Telehealth: Payer: Self-pay | Admitting: Physician Assistant

## 2022-08-31 ENCOUNTER — Other Ambulatory Visit: Payer: Medicare Other | Admitting: *Deleted

## 2022-08-31 NOTE — Telephone Encounter (Signed)
Please see message. °

## 2022-08-31 NOTE — Telephone Encounter (Signed)
Spoke to pt's wife [name], told her Aldona Bar is confused why you are asking for x-rays, did he hurt himself? [name] said no, wanted to know if need f/u x-rays from rib fracture and back fx. Told her he will need to be seen to discuss and also needs to be seen about questionable seizure and oxygen level. [name] verbalized understanding and said pt was just looking at her but not responding to her and his whole body started shaking for about 20 seconds, then he said what and she asked him if he know what happened and pt said his oxygen level dropped. [name] said she will call to schedule an appt with [facility]. She said she tried to get pt to go to the ED or office when episode happened on 9/22, but pt refused. Told pt okay just schedule an appt to discuss. [name] said she will call back.

## 2022-08-31 NOTE — Telephone Encounter (Signed)
Caller states: -Patient suffered a seizure on 09/22 but was not seen for this due to not wanting to go to ED  - States patient believes it was caused due to O2 level reaching below 80  - Patient is still followed by home health and palliative care. Both nurses have been notified of seizure  - She just wanted PCP to be aware of this event   Caller requests: -Clarification on if follow up x rays are needed for patients ribs and back   Please advise.

## 2022-09-03 ENCOUNTER — Other Ambulatory Visit: Payer: Self-pay | Admitting: Physician Assistant

## 2022-09-08 ENCOUNTER — Telehealth: Payer: Self-pay | Admitting: Physician Assistant

## 2022-09-08 NOTE — Telephone Encounter (Signed)
.  Home Health Certification or Plan of Care Tracking  Is this a Certification or Plan of Care? yes  [facility] Agency: [place]  Order Number:  [id number]  Has charge sheet been attached? yes  Where has form been placed:  In provider's box  Faxed to:   [phone]

## 2022-09-08 NOTE — Telephone Encounter (Signed)
Form placed on provider's desk for review and signature.  

## 2022-09-12 ENCOUNTER — Encounter: Payer: Self-pay | Admitting: Physician Assistant

## 2022-09-12 ENCOUNTER — Ambulatory Visit (INDEPENDENT_AMBULATORY_CARE_PROVIDER_SITE_OTHER): Payer: Medicare Other | Admitting: Physician Assistant

## 2022-09-12 VITALS — BP 116/70 | HR 102 | Temp 97.5°F | Ht 70.5 in | Wt 263.5 lb

## 2022-09-12 DIAGNOSIS — R0781 Pleurodynia: Secondary | ICD-10-CM

## 2022-09-12 DIAGNOSIS — Z23 Encounter for immunization: Secondary | ICD-10-CM | POA: Diagnosis not present

## 2022-09-12 DIAGNOSIS — J849 Interstitial pulmonary disease, unspecified: Secondary | ICD-10-CM

## 2022-09-12 NOTE — Progress Notes (Signed)
Maurice Reed is a 81 y.o. male here for a follow up of a pre-existing problem.  History of Present Illness:   Chief Complaint  Patient presents with   F/o on rib fracture    HPI  Patient is in today for an office visit.   ILD Patient is requesting a wheelchair this visit. He is oxygen dependent and has a significant time ambulating.  He is concerned that his portable oxygen machine is not sufficient for his oxygen needs. Patient experienced a seizure like episode on 9/13 - was sitting in a chair at a restaurant and all of sudden slumped over in his chair. Patient's body was shaking and was staring blankly for several seconds. He reports that this was due to decreased oxygen from his O2 machine. Wife witnessed this episode. She states that something similar happened to him before when he didn't have enough oxygen.   Rib pain Patient is complaining of deep right side rib pain. Sustained lateral right 9th and 10th rib fractures and posterior 11th rib fracture in August.  Denies severe pain or SOB. Sees pain management for his pain.   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   Diverticula, bladder    Erectile dysfunction    GERD (gastroesophageal reflux disease)    Kidney stones    Obesity    OSA (obstructive sleep apnea)    Pulmonary embolism ([facility])      Social History   Tobacco Use   Smoking status: Former    Packs/day: 3.00    Years: 25.00    Total pack years: 75.00    Types: Cigarettes    Quit date: 11/28/1984    Years since quitting: 37.8   Smokeless tobacco: Never  Vaping Use   Vaping Use: Never used  Substance Use Topics   Alcohol use: Yes    Alcohol/week: 1.0 standard drink of alcohol    Types: 1 Glasses of wine per week    Comment: at times   Drug use: Not Currently    Types: Marijuana    Comment: Gummies     Past Surgical History:  Procedure Laterality  Date   APPENDECTOMY  1963   LITHOTRIPSY  1987   NOSE SURGERY  2003   TONSILLECTOMY AND ADENOIDECTOMY  7353   UMBILICAL HERNIA REPAIR  1984    Family History  Problem Relation Age of Onset   Arthritis Mother    Hearing loss Mother    Hypertension Mother    Heart disease Mother    Stroke Mother    Arthritis Father    Hearing loss Father    Heart disease Father    Hypertension Father    Heart attack Father    Kidney disease Father    Stroke Father     Allergies  Allergen Reactions   Other Itching    Cats: watery eyes, itching, sneezing    Current Medications:   Current Outpatient Medications:    Albuterol Sulfate (PROAIR RESPICLICK) 299 (90 Base) MCG/ACT AEPB, Inhale 2 puffs into the lungs every 6 (six) hours as needed., Disp: , Rfl:    Apremilast (OTEZLA) 30 MG TABS, Take 30 mg by mouth 2 (two) times daily., Disp: , Rfl:    Ascorbic Acid (VITAMIN C) 500 MG CAPS, Take 500 mg by mouth daily., Disp: , Rfl:    Atropine Sulfate-NaCl 0.01-0.9 %  SOLN, Place 1 drop into the left eye at bedtime., Disp: , Rfl:    augmented betamethasone dipropionate (DIPROLENE-AF) 0.05 % cream, Apply topically., Disp: , Rfl:    bimatoprost (LUMIGAN) 0.01 % SOLN, Place 1 drop into the right eye at bedtime., Disp: , Rfl:    budesonide-formoterol (SYMBICORT) 160-4.5 MCG/ACT inhaler, Inhale 2 puffs into the lungs 2 (two) times daily., Disp: , Rfl:    Cholecalciferol (VITAMIN D3) 3000 units TABS, Take 3,000 Units by mouth daily., Disp: , Rfl:    clobetasol (TEMOVATE) 0.05 % external solution, APPLY TO AFFECTED AREA(S)  TOPICALLY ON SCALP 1 TO 2  TIMES DAILY AS NEEDED (Patient taking differently: 1 application  2 (two) times daily as needed (for scalp).), Disp: 150 mL, Rfl: 3   desonide (DESOWEN) 0.05 % lotion, Apply 1 application topically daily as needed (itching)., Disp: , Rfl:    docusate sodium (COLACE) 100 MG capsule, Take 100 mg by mouth 2 (two) times daily., Disp: , Rfl:    doxycycline (VIBRAMYCIN)  100 MG capsule, Take 1 capsule by mouth twice daily, Disp: 180 capsule, Rfl: 0   ELIQUIS 5 MG TABS tablet, TAKE 1 TABLET BY MOUTH TWICE A DAY, Disp: 180 tablet, Rfl: 1   erythromycin ophthalmic ointment, Place 1 application into both eyes at bedtime., Disp: , Rfl: 1   fluticasone (CUTIVATE) 0.05 % cream, Apply 1 Application topically daily., Disp: , Rfl:    ipratropium-albuterol (DUONEB) 0.5-2.5 (3) MG/3ML SOLN, Take 3 mLs by nebulization every 6 (six) hours as needed. (Patient taking differently: Take 3 mLs by nebulization every 6 (six) hours as needed (wheezing).), Disp: 360 mL, Rfl: 3   ketorolac (ACULAR) 0.5 % ophthalmic solution, Place 1 drop into both eyes 4 (four) times daily as needed (irritation)., Disp: , Rfl:    magnesium oxide (MAG-OX) 400 (240 Mg) MG tablet, TAKE 1 TABLET BY MOUTH TWICE A DAY, Disp: 180 tablet, Rfl: 2   metoprolol succinate (TOPROL-XL) 25 MG 24 hr tablet, Take 1 tablet (25 mg total) by mouth daily., Disp: 90 tablet, Rfl: 3   mometasone (NASONEX) 50 MCG/ACT nasal spray, Place 2 sprays into the nose daily. (Patient taking differently: Place 2 sprays into the nose daily as needed (congestion).), Disp: 51 g, Rfl: 2   NARCAN 4 MG/0.1ML LIQD nasal spray kit, 1 spray once as needed (overdose)., Disp: , Rfl: 0   nystatin ointment (MYCOSTATIN), Apply 1 application topically 2 (two) times daily. (Patient taking differently: Apply 1 application  topically daily as needed (skinn irritation).), Disp: 30 g, Rfl: 1   oxyCODONE (OXY IR/ROXICODONE) 5 MG immediate release tablet, Take 5 mg by mouth 3 (three) times daily as needed for severe pain. for pain, Disp: , Rfl: 0   polyethylene glycol (MIRALAX / GLYCOLAX) 17 g packet, Take by mouth., Disp: , Rfl:    potassium chloride (KLOR-CON) 10 MEQ tablet, Take 2 tablets in the AM and one in the PM., Disp: 30 tablet, Rfl: 0   prednisoLONE acetate (PRED FORTE) 1 % ophthalmic suspension, Place 1 drop into the left eye at bedtime., Disp: , Rfl:     pregabalin (LYRICA) 50 MG capsule, Take 50 mg by mouth 2 (two) times daily., Disp: , Rfl:    spironolactone (ALDACTONE) 25 MG tablet, Take 12.5 mg by mouth daily., Disp: , Rfl:    tadalafil (CIALIS) 20 MG tablet, TAKE 1/2 TO 1 TABLET BY MOUTH EVERY OTHER DAY AS NEEDED FOR ERECTILE DYSFUNCTION, Disp: 5 tablet, Rfl: 11   tamsulosin (  FLOMAX) 0.4 MG CAPS capsule, Take 1 capsule by mouth at bedtime, Disp: 80 capsule, Rfl: 3   temazepam (RESTORIL) 15 MG capsule, TAKE 1 CAPSULE BY MOUTH AT BEDTIME AS NEEDED FOR SLEEP, Disp: 90 capsule, Rfl: 0   torsemide (DEMADEX) 20 MG tablet, Take 40 mg by mouth 2 (two) times daily. If weight is more than 3 pounds take another 10 mg for every 3 pound gain., Disp: , Rfl:    TRIAMCINOLONE PO, Apply 1 application topically daily as needed (itching)., Disp: , Rfl:    zinc gluconate 50 MG tablet, Take 50 mg by mouth daily., Disp: , Rfl:    Review of Systems:   Review of Systems  Musculoskeletal:        (+) right side rib pain  Negative unless otherwise specified per HPI.   Vitals:   Vitals:   09/12/22 1332  BP: 116/70  Pulse: (!) 102  Temp: (!) 97.5 F (36.4 C)  TempSrc: Temporal  SpO2: 96%  Weight: 263 lb 8 oz (119.5 kg)  Height: 5' 10.5" (1.791 m)     Body mass index is 37.27 kg/m.  Physical Exam:   Physical Exam Constitutional:      General: He is not in acute distress.    Appearance: Normal appearance. He is not ill-appearing.  HENT:     Head: Normocephalic and atraumatic.     Right Ear: External ear normal.     Left Ear: External ear normal.  Eyes:     Extraocular Movements: Extraocular movements intact.     Pupils: Pupils are equal, round, and reactive to light.  Cardiovascular:     Rate and Rhythm: Normal rate. Rhythm irregularly irregular.     Heart sounds: Normal heart sounds. No murmur heard.    No gallop.  Pulmonary:     Effort: Pulmonary effort is normal. No respiratory distress.     Breath sounds: Normal breath sounds. No  wheezing or rales.  Chest:     Comments: Tender to lateral aspect of R rib with deep palpation Skin:    General: Skin is warm and dry.  Neurological:     Mental Status: He is alert and oriented to person, place, and time.  Psychiatric:        Judgment: Judgment normal.     Assessment and Plan:   ILD (interstitial lung disease) ([place]) Wheelchair DME order provided Discussed that patient may need portable oxygen tank for better management of oxygen when leaving house - asked them to let us know how we can facilitate this if needed Wife feels as though his seizure like episode was due to oxygen, does not feel like neuro eval is warranted Continue to monitor Seeing [facility]  Rib pain on right side Will xray area for further evaluation  Inda Coke, PA-C

## 2022-09-12 NOTE — Patient Instructions (Addendum)
It was great to see you!  An order for an xray has been put in for you. To get your xray, you can walk in at the [address] [facility] location without a scheduled appointment.  The address is [address] It is across the street from [place] is located in the basement.  Hours of operation are M-F 8:30am to 5:00pm. Please note that they are closed for lunch between 12:30 and 1:00pm.  Please let me know how to facilitate a more reliable portable oxygen tank for you  I will be in touch with your results  Aldona Bar

## 2022-09-16 ENCOUNTER — Ambulatory Visit: Payer: Medicare Other | Admitting: *Deleted

## 2022-09-16 ENCOUNTER — Telehealth: Payer: Self-pay | Admitting: Physician Assistant

## 2022-09-16 LAB — COMPREHENSIVE METABOLIC PANEL
Albumin: 3.8 (ref 3.5–5.0)
Calcium: 10 (ref 8.7–10.7)
Globulin: 3
eGFR: 68

## 2022-09-16 LAB — CBC: RBC: 3.76 — AB (ref 3.87–5.11)

## 2022-09-16 LAB — BASIC METABOLIC PANEL
BUN: 23 — AB (ref 4–21)
CO2: 34 — AB (ref 13–22)
Chloride: 93 — AB (ref 99–108)
Creatinine: 1 (ref 0.6–1.3)
Glucose: 94
Potassium: 5.2 mEq/L — AB (ref 3.5–5.1)
Sodium: 141 (ref 137–147)

## 2022-09-16 LAB — VITAMIN D 25 HYDROXY (VIT D DEFICIENCY, FRACTURES): Vit D, 25-Hydroxy: 46.55

## 2022-09-16 LAB — HEPATIC FUNCTION PANEL
ALT: 11 U/L (ref 10–40)
AST: 24 (ref 14–40)
Alkaline Phosphatase: 120 (ref 25–125)
Bilirubin, Total: 0.5

## 2022-09-16 LAB — CBC AND DIFFERENTIAL
HCT: 40 — AB (ref 41–53)
Hemoglobin: 13.5 (ref 13.5–17.5)
Platelets: 224 10*3/uL (ref 150–400)
WBC: 6.4

## 2022-09-16 NOTE — Telephone Encounter (Signed)
Patient's wife [name] states Patient is still at pain management appointment and unable to take Tina's call for Telephone visit.

## 2022-09-16 NOTE — Patient Instructions (Signed)
Health Maintenance, Male Adopting a healthy lifestyle and getting preventive care are important in promoting health and wellness. Ask your health care provider about: The right schedule for you to have regular tests and exams. Things you can do on your own to prevent diseases and keep yourself healthy. What should I know about diet, weight, and exercise? Eat a healthy diet  Eat a diet that includes plenty of vegetables, fruits, low-fat dairy products, and lean protein. Do not eat a lot of foods that are high in solid fats, added sugars, or sodium. Maintain a healthy weight Body mass index (BMI) is a measurement that can be used to identify possible weight problems. It estimates body fat based on height and weight. Your health care provider can help determine your BMI and help you achieve or maintain a healthy weight. Get regular exercise Get regular exercise. This is one of the most important things you can do for your health. Most adults should: Exercise for at least 150 minutes each week. The exercise should increase your heart rate and make you sweat (moderate-intensity exercise). Do strengthening exercises at least twice a week. This is in addition to the moderate-intensity exercise. Spend less time sitting. Even light physical activity can be beneficial. Watch cholesterol and blood lipids Have your blood tested for lipids and cholesterol at 81 years of age, then have this test every 5 years. You may need to have your cholesterol levels checked more often if: Your lipid or cholesterol levels are high. You are older than 81 years of age. You are at high risk for heart disease. What should I know about cancer screening? Many types of cancers can be detected early and may often be prevented. Depending on your health history and family history, you may need to have cancer screening at various ages. This may include screening for: Colorectal cancer. Prostate cancer. Skin cancer. Lung  cancer. What should I know about heart disease, diabetes, and high blood pressure? Blood pressure and heart disease High blood pressure causes heart disease and increases the risk of stroke. This is more likely to develop in people who have high blood pressure readings or are overweight. Talk with your health care provider about your target blood pressure readings. Have your blood pressure checked: Every 3-5 years if you are 18-39 years of age. Every year if you are 40 years old or older. If you are between the ages of 65 and 75 and are a current or former smoker, ask your health care provider if you should have a one-time screening for abdominal aortic aneurysm (AAA). Diabetes Have regular diabetes screenings. This checks your fasting blood sugar level. Have the screening done: Once every three years after age 45 if you are at a normal weight and have a low risk for diabetes. More often and at a younger age if you are overweight or have a high risk for diabetes. What should I know about preventing infection? Hepatitis B If you have a higher risk for hepatitis B, you should be screened for this virus. Talk with your health care provider to find out if you are at risk for hepatitis B infection. Hepatitis C Blood testing is recommended for: Everyone born from 1945 through 1965. Anyone with known risk factors for hepatitis C. Sexually transmitted infections (STIs) You should be screened each year for STIs, including gonorrhea and chlamydia, if: You are sexually active and are younger than 81 years of age. You are older than 81 years of age and your   health care provider tells you that you are at risk for this type of infection. Your sexual activity has changed since you were last screened, and you are at increased risk for chlamydia or gonorrhea. Ask your health care provider if you are at risk. Ask your health care provider about whether you are at high risk for HIV. Your health care provider  may recommend a prescription medicine to help prevent HIV infection. If you choose to take medicine to prevent HIV, you should first get tested for HIV. You should then be tested every 3 months for as long as you are taking the medicine. Follow these instructions at home: Alcohol use Do not drink alcohol if your health care provider tells you not to drink. If you drink alcohol: Limit how much you have to 0-2 drinks a day. Know how much alcohol is in your drink. In the [place] one drink equals one 12 oz bottle of beer (355 mL), one 5 oz glass of Kadeshia Kasparian (148 mL), or one 1 oz glass of hard liquor (44 mL). Lifestyle Do not use any products that contain nicotine or tobacco. These products include cigarettes, chewing tobacco, and vaping devices, such as e-cigarettes. If you need help quitting, ask your health care provider. Do not use street drugs. Do not share needles. Ask your health care provider for help if you need support or information about quitting drugs. General instructions Schedule regular health, dental, and eye exams. Stay current with your vaccines. Tell your health care provider if: You often feel depressed. You have ever been abused or do not feel safe at home. Summary Adopting a healthy lifestyle and getting preventive care are important in promoting health and wellness. Follow your health care provider's instructions about healthy diet, exercising, and getting tested or screened for diseases. Follow your health care provider's instructions on monitoring your cholesterol and blood pressure. This information is not intended to replace advice given to you by your health care provider. Make sure you discuss any questions you have with your health care provider. Document Revised: 04/12/2021 Document Reviewed: 04/12/2021 Elsevier Patient Education  2023 Elsevier Inc.  

## 2022-09-16 NOTE — Progress Notes (Signed)
Erroneous encounter

## 2022-09-21 ENCOUNTER — Telehealth: Payer: Self-pay | Admitting: Physician Assistant

## 2022-09-21 ENCOUNTER — Encounter: Payer: Self-pay | Admitting: Physician Assistant

## 2022-09-21 NOTE — Telephone Encounter (Signed)
..  Home Health Certification or Plan of Care Tracking  Is this a Certification or Plan of Care? Yes  [place]: [facility]  Order Number:  [id number]  Has charge sheet been attached? yes  Where has form been placed:  In provider's box  Faxed to:   [phone]

## 2022-09-22 NOTE — Telephone Encounter (Signed)
Aldona Bar signed the forms.

## 2022-09-23 ENCOUNTER — Encounter: Payer: Self-pay | Admitting: Physician Assistant

## 2022-09-23 ENCOUNTER — Telehealth: Payer: Self-pay | Admitting: Physician Assistant

## 2022-09-23 ENCOUNTER — Other Ambulatory Visit: Payer: Self-pay | Admitting: Physician Assistant

## 2022-09-23 DIAGNOSIS — L719 Rosacea, unspecified: Secondary | ICD-10-CM

## 2022-09-23 NOTE — Telephone Encounter (Signed)
..  Type of form received: POC   Additional comments: URGENT  Received by: [place] (LEX)  Form should be Faxed to: [phone]   Form should be mailed to:   na   Is patient requesting call for pickup: Na   Form placed:   samanthas folder   Attach charge sheet.  Yes   Individual made aware of 3-5 business day turn around (Y/N)?   No

## 2022-09-26 NOTE — Telephone Encounter (Signed)
Papers signed by Aldona Bar and give to front office to fax.

## 2022-10-04 ENCOUNTER — Telehealth: Payer: Self-pay | Admitting: Physician Assistant

## 2022-10-04 DIAGNOSIS — Z86711 Personal history of pulmonary embolism: Secondary | ICD-10-CM

## 2022-10-04 DIAGNOSIS — J849 Interstitial pulmonary disease, unspecified: Secondary | ICD-10-CM

## 2022-10-04 DIAGNOSIS — I5032 Chronic diastolic (congestive) heart failure: Secondary | ICD-10-CM

## 2022-10-04 NOTE — Telephone Encounter (Signed)
Caller States: -pt went to see the cardiologist (Dr. Lyanne Co) and an echocardiogram was performed. -At that appointment provider wanted blood work, but wait time was too long and he would have run out of oxygen. -After conference with Dr. Lyanne Co, the recommendation was to see if blood draw could be done at PCP office. -Report from cardiologist is the leaky heart valve seems to be worse and will schedule a follow up with them for closer examination.   Caller Requests: -PCP team to write orders for Basic Metabolic Screening for the patient.    Please advise.  Caller acknowledged the PCP had to approve the orders before appointment could be scheduled.  Caller made aware, if labs order, the walk-in [facility] lab may be another option.

## 2022-10-04 NOTE — Telephone Encounter (Signed)
Caller is Angela Cox, RN with [facility] states: -Patient visited clinic yesterday to see Dr. Ranee Gosselin but for some reason didn't get labs drawn as planned  - They didn't want patient to have to travel all the way back just for labs.   Caller requests: - Labs be completed at PCP office  - She receive a callback at [phone] with any questions/ or if something prevents this from happening    Caller has faxed a formal request as well as lab requisition orders to PCP office on 10/04/22.

## 2022-10-04 NOTE — Telephone Encounter (Signed)
Please see message and advise if okay to place lab orders?

## 2022-10-05 NOTE — Telephone Encounter (Signed)
Spoke to [place] pt's wife told her lab orders have been placed for pt to have labs drawn here just need to schedule lab appt only. [name] verbalized understanding and will schedule appt. Also she asked for referral to Pulmonary at [facility] for pt. Told her it was sent in June, but closed. I will place order for new referral. [name] verbalized understanding. Referral put in Epic.

## 2022-10-05 NOTE — Addendum Note (Signed)
Addended by: Marian Sorrow on: 10/05/2022 12:59 PM   Modules accepted: Orders

## 2022-10-05 NOTE — Telephone Encounter (Signed)
Aldona Bar, received fax from Cardiology requesting lab work be done here. Please advise.

## 2022-10-07 ENCOUNTER — Other Ambulatory Visit: Payer: Self-pay | Admitting: Physician Assistant

## 2022-10-07 DIAGNOSIS — J849 Interstitial pulmonary disease, unspecified: Secondary | ICD-10-CM

## 2022-10-13 ENCOUNTER — Other Ambulatory Visit (INDEPENDENT_AMBULATORY_CARE_PROVIDER_SITE_OTHER): Payer: Medicare Other

## 2022-10-13 DIAGNOSIS — I5032 Chronic diastolic (congestive) heart failure: Secondary | ICD-10-CM | POA: Diagnosis not present

## 2022-10-13 LAB — BASIC METABOLIC PANEL
BUN: 19 mg/dL (ref 6–23)
CO2: 41 mEq/L — ABNORMAL HIGH (ref 19–32)
Calcium: 9.6 mg/dL (ref 8.4–10.5)
Chloride: 94 mEq/L — ABNORMAL LOW (ref 96–112)
Creatinine, Ser: 1.06 mg/dL (ref 0.40–1.50)
GFR: 65.75 mL/min (ref >60.00)
Glucose, Bld: 100 mg/dL — ABNORMAL HIGH (ref 70–99)
Potassium: 4.3 mEq/L (ref 3.5–5.1)
Sodium: 140 mEq/L (ref 135–145)

## 2022-10-17 LAB — NT-PROBNP: Pro B Natriuretic peptide (BNP): 646 pg/mL — ABNORMAL HIGH (ref <450)

## 2022-10-21 ENCOUNTER — Telehealth: Payer: Self-pay | Admitting: Physician Assistant

## 2022-10-21 NOTE — Telephone Encounter (Signed)
Please see message and advise 

## 2022-10-21 NOTE — Telephone Encounter (Signed)
Patient's wife [name] states Patient's Cardiologist told Patient to let PCP know that Patient needs a complete Iron study and that the results be forwarded to Dr. Hillery Jacks at [name]

## 2022-10-24 ENCOUNTER — Other Ambulatory Visit: Payer: Self-pay | Admitting: *Deleted

## 2022-10-24 ENCOUNTER — Other Ambulatory Visit (INDEPENDENT_AMBULATORY_CARE_PROVIDER_SITE_OTHER): Payer: Medicare Other

## 2022-10-24 DIAGNOSIS — I5032 Chronic diastolic (congestive) heart failure: Secondary | ICD-10-CM

## 2022-10-24 NOTE — Telephone Encounter (Signed)
Orders placed in Epic for labs and My Chart message sent.

## 2022-10-25 LAB — IBC + FERRITIN
Ferritin: 451.3 ng/mL — ABNORMAL HIGH (ref 22.0–322.0)
Iron: 91 ug/dL (ref 42–165)
Saturation Ratios: 36.7 % (ref 20.0–50.0)
TIBC: 247.8 ug/dL — ABNORMAL LOW (ref 250.0–450.0)
Transferrin: 177 mg/dL — ABNORMAL LOW (ref 212.0–360.0)

## 2022-10-25 NOTE — Progress Notes (Signed)
Lab results faxed to Dr. Hillery Jacks at [facility] [phone].

## 2022-11-25 ENCOUNTER — Other Ambulatory Visit: Payer: Self-pay | Admitting: Physician Assistant

## 2022-12-09 ENCOUNTER — Other Ambulatory Visit: Payer: Self-pay | Admitting: Physician Assistant

## 2022-12-17 ENCOUNTER — Other Ambulatory Visit: Payer: Self-pay | Admitting: Physician Assistant

## 2022-12-22 ENCOUNTER — Ambulatory Visit (INDEPENDENT_AMBULATORY_CARE_PROVIDER_SITE_OTHER): Payer: Medicare Other | Admitting: Physician Assistant

## 2022-12-22 ENCOUNTER — Telehealth: Payer: Self-pay | Admitting: Pulmonary Disease

## 2022-12-22 ENCOUNTER — Telehealth: Payer: Self-pay | Admitting: Physician Assistant

## 2022-12-22 ENCOUNTER — Encounter: Payer: Self-pay | Admitting: Physician Assistant

## 2022-12-22 VITALS — BP 117/66 | HR 79 | Temp 97.7°F | Ht 70.5 in

## 2022-12-22 DIAGNOSIS — R051 Acute cough: Secondary | ICD-10-CM | POA: Diagnosis not present

## 2022-12-22 DIAGNOSIS — J849 Interstitial pulmonary disease, unspecified: Secondary | ICD-10-CM | POA: Diagnosis not present

## 2022-12-22 LAB — POCT INFLUENZA A/B
Influenza A, POC: NEGATIVE
Influenza B, POC: NEGATIVE

## 2022-12-22 LAB — POC COVID19 BINAXNOW: SARS Coronavirus 2 Ag: NEGATIVE

## 2022-12-22 MED ORDER — LEVOFLOXACIN 500 MG PO TABS: 500.0000 mg | ORAL_TABLET | Freq: Every day | ORAL | 0 refills | Status: DC

## 2022-12-22 MED ORDER — CEFTRIAXONE SODIUM 1 G IJ SOLR
1.0000 g | Freq: Once | INTRAMUSCULAR | Status: AC
  Administered 2022-12-22: 1 g via INTRAMUSCULAR

## 2022-12-22 MED ORDER — PREDNISONE 20 MG PO TABS: 40.0000 mg | ORAL_TABLET | Freq: Every day | ORAL | 0 refills | Status: DC

## 2022-12-22 NOTE — Progress Notes (Signed)
Maurice Reed is a 82 y.o. male here for a new problem.  History of Present Illness:   Chief Complaint  Patient presents with   Cough    Pt states started a deep cough yesterday, pt states he just feels yucky and tired.     HPI  Cough He has been experiencing a severe cough that started yesterday. Notes fatigue and body aches. He has been in contact with his daughter -in-law and wife who are also sick. He is taking Doxycycline twice daily at baseline for his rosacea. Pt has a Hx of ILD followed by pulmonology Dr. Vassie Loll. He is still looking for a new pulmonologist as he has transferred all of his care to [facility]. He has had to increase supplemental O2 to 5-6L depending on activity. He is using Albuterol every 4 hours with his current symptoms. He is also using Nasonex nasal spray.  Denies chest pain and severe SOB, denies changes to LE swelling.  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   Diverticula, bladder    Erectile dysfunction    GERD (gastroesophageal reflux disease)    Kidney stones    Obesity    OSA (obstructive sleep apnea)    Pulmonary embolism (HCC)      Social History   Tobacco Use   Smoking status: Former    Packs/day: 3.00    Years: 25.00    Total pack years: 75.00    Types: Cigarettes    Quit date: 11/28/1984    Years since quitting: 38.0   Smokeless tobacco: Never  Vaping Use   Vaping Use: Never used  Substance Use Topics   Alcohol use: Yes    Alcohol/week: 1.0 standard drink of alcohol    Types: 1 Glasses of wine per week    Comment: at times   Drug use: Not Currently    Types: Marijuana    Comment: Gummies     Past Surgical History:  Procedure Laterality Date   APPENDECTOMY  1963   LITHOTRIPSY  1987   NOSE SURGERY  2003   TONSILLECTOMY AND ADENOIDECTOMY  1948   UMBILICAL HERNIA REPAIR  1984    Family History  Problem Relation Age of Onset    Arthritis Mother    Hearing loss Mother    Hypertension Mother    Heart disease Mother    Stroke Mother    Arthritis Father    Hearing loss Father    Heart disease Father    Hypertension Father    Heart attack Father    Kidney disease Father    Stroke Father     Allergies  Allergen Reactions   Other Itching    Cats: watery eyes, itching, sneezing    Current Medications:   Current Outpatient Medications:    Albuterol Sulfate (PROAIR RESPICLICK) 108 (90 Base) MCG/ACT AEPB, Inhale 2 puffs into the lungs every 6 (six) hours as needed., Disp: , Rfl:    Apremilast (OTEZLA) 30 MG TABS, Take 30 mg by mouth 2 (two) times daily., Disp: , Rfl:    Ascorbic Acid (VITAMIN C) 500 MG CAPS, Take 500 mg by mouth daily., Disp: , Rfl:    Atropine Sulfate-NaCl 0.01-0.9 % SOLN, Place 1 drop into the left eye at bedtime., Disp: , Rfl:    augmented betamethasone dipropionate (DIPROLENE-AF) 0.05 % cream, Apply topically., Disp: , Rfl:  bimatoprost (LUMIGAN) 0.01 % SOLN, Place 1 drop into the right eye at bedtime., Disp: , Rfl:    budesonide-formoterol (SYMBICORT) 160-4.5 MCG/ACT inhaler, Inhale 2 puffs into the lungs 2 (two) times daily., Disp: , Rfl:    Cholecalciferol (VITAMIN D3) 3000 units TABS, Take 3,000 Units by mouth daily., Disp: , Rfl:    clobetasol (TEMOVATE) 0.05 % external solution, APPLY TOPICALLY TO AFFECTED AREA ON SCALP 1 TO 2 TIMES DAILY AS NEEDED, Disp: 150 mL, Rfl: 0   desonide (DESOWEN) 0.05 % lotion, Apply 1 application topically daily as needed (itching)., Disp: , Rfl:    docusate sodium (COLACE) 100 MG capsule, Take 100 mg by mouth 2 (two) times daily., Disp: , Rfl:    doxycycline (VIBRAMYCIN) 100 MG capsule, Take 1 capsule by mouth twice daily, Disp: 180 capsule, Rfl: 0   ELIQUIS 5 MG TABS tablet, TAKE 1 TABLET BY MOUTH TWICE A DAY, Disp: 180 tablet, Rfl: 1   erythromycin ophthalmic ointment, Place 1 application into both eyes at bedtime., Disp: , Rfl: 1   fluticasone  (CUTIVATE) 0.05 % cream, Apply 1 Application topically daily., Disp: , Rfl:    ipratropium-albuterol (DUONEB) 0.5-2.5 (3) MG/3ML SOLN, Take 3 mLs by nebulization every 6 (six) hours as needed. (Patient taking differently: Take 3 mLs by nebulization every 6 (six) hours as needed (wheezing).), Disp: 360 mL, Rfl: 3   ketorolac (ACULAR) 0.5 % ophthalmic solution, Place 1 drop into both eyes 4 (four) times daily as needed (irritation)., Disp: , Rfl:    levofloxacin (LEVAQUIN) 500 MG tablet, Take 1 tablet (500 mg total) by mouth daily for 7 days., Disp: 7 tablet, Rfl: 0   magnesium oxide (MAG-OX) 400 (240 Mg) MG tablet, TAKE 1 TABLET BY MOUTH TWICE A DAY, Disp: 180 tablet, Rfl: 2   metoprolol succinate (TOPROL-XL) 25 MG 24 hr tablet, Take 1 tablet (25 mg total) by mouth daily., Disp: 90 tablet, Rfl: 3   mometasone (NASONEX) 50 MCG/ACT nasal spray, Place 2 sprays into the nose daily. (Patient taking differently: Place 2 sprays into the nose daily as needed (congestion).), Disp: 51 g, Rfl: 2   NARCAN 4 MG/0.1ML LIQD nasal spray kit, 1 spray once as needed (overdose)., Disp: , Rfl: 0   nystatin ointment (MYCOSTATIN), Apply 1 application topically 2 (two) times daily. (Patient taking differently: Apply 1 application  topically daily as needed (skinn irritation).), Disp: 30 g, Rfl: 1   oxyCODONE (OXY IR/ROXICODONE) 5 MG immediate release tablet, Take 5 mg by mouth 3 (three) times daily as needed for severe pain. for pain, Disp: , Rfl: 0   polyethylene glycol (MIRALAX / GLYCOLAX) 17 g packet, Take by mouth., Disp: , Rfl:    potassium chloride (KLOR-CON) 10 MEQ tablet, Take 2 tablets in the AM and one in the PM., Disp: 30 tablet, Rfl: 0   prednisoLONE acetate (PRED FORTE) 1 % ophthalmic suspension, Place 1 drop into the left eye at bedtime., Disp: , Rfl:    predniSONE (DELTASONE) 20 MG tablet, Take 2 tablets (40 mg total) by mouth daily., Disp: 10 tablet, Rfl: 0   pregabalin (LYRICA) 50 MG capsule, Take 50 mg by  mouth 2 (two) times daily., Disp: , Rfl:    spironolactone (ALDACTONE) 25 MG tablet, Take 12.5 mg by mouth daily., Disp: , Rfl:    tadalafil (CIALIS) 20 MG tablet, TAKE 1/2 TO 1 TABLET BY MOUTH EVERY OTHER DAY AS NEEDED FOR ERECTILE DYSFUNCTION, Disp: 5 tablet, Rfl: 11   tamsulosin (FLOMAX) 0.4  MG CAPS capsule, Take 1 capsule by mouth at bedtime, Disp: 80 capsule, Rfl: 3   temazepam (RESTORIL) 15 MG capsule, TAKE 1 CAPSULE BY MOUTH AT BEDTIME AS NEEDED FOR SLEEP, Disp: 90 capsule, Rfl: 0   TRIAMCINOLONE PO, Apply 1 application topically daily as needed (itching)., Disp: , Rfl:    zinc gluconate 50 MG tablet, Take 50 mg by mouth daily., Disp: , Rfl:    Review of Systems:   Review of Systems  Constitutional:  Positive for malaise/fatigue. Negative for fever.  HENT:  Negative for congestion.   Eyes:  Negative for blurred vision.  Respiratory:  Positive for cough. Negative for shortness of breath.   Cardiovascular:  Negative for chest pain, palpitations and leg swelling.  Gastrointestinal:  Negative for vomiting.  Musculoskeletal:  Positive for myalgias (body aches). Negative for back pain.  Skin:  Negative for rash.  Neurological:  Negative for loss of consciousness and headaches.    Vitals:   Vitals:   12/22/22 1132  BP: 117/66  Pulse: 79  Temp: 97.7 F (36.5 C)  TempSrc: Temporal  SpO2: 100%  Height: 5' 10.5" (1.791 m)     Body mass index is 37.27 kg/m.  Physical Exam:   Physical Exam Vitals and nursing note reviewed.  Constitutional:      General: He is not in acute distress.    Appearance: He is well-developed. He is not ill-appearing or toxic-appearing.  Cardiovascular:     Rate and Rhythm: Normal rate and regular rhythm.     Pulses: Normal pulses.     Heart sounds: Normal heart sounds, S1 normal and S2 normal.  Pulmonary:     Effort: Pulmonary effort is normal.     Breath sounds: Examination of the right-upper field reveals wheezing and rhonchi. Examination of the  right-middle field reveals wheezing and rhonchi. Examination of the right-lower field reveals wheezing. Wheezing and rhonchi present.  Skin:    General: Skin is warm and dry.  Neurological:     Mental Status: He is alert.     GCS: GCS eye subscore is 4. GCS verbal subscore is 5. GCS motor subscore is 6.  Psychiatric:        Speech: Speech normal.        Behavior: Behavior normal. Behavior is cooperative.    Results for orders placed or performed in visit on 12/22/22  POC COVID-19  Result Value Ref Range   SARS Coronavirus 2 Ag Negative Negative  POCT Influenza A/B  Result Value Ref Range   Influenza A, POC Negative Negative   Influenza B, POC Negative Negative    Assessment and Plan:   Acute cough; ILD (interstitial lung disease) ([place]) No red flags on exam.  Will initiate levaquin and prednisone per orders for presumed CAP.  Received 1 g rocephin injection today. Recommend close follow-up with current pulmonologist. Continue Symbicort as prescribed and prn albuterol If new/worsening sx, needs to go to the ER If lack of improvement, consider CXR. Discussed taking medications as prescribed. Reviewed return precautions including worsening fever, SOB, worsening cough or other concerns. Push fluids and rest. I recommend that patient follow-up if symptoms worsen or persist despite treatment x 7-10 days, sooner if needed.  I,Alexander Ruley,acting as a Education administrator for [employer], PA.,have documented all relevant documentation on the behalf of Inda Coke, PA,as directed by  Inda Coke, PA while in the presence of Inda Coke, [place].  I, Inda Coke, [place], have reviewed all documentation for this visit. The documentation  on 12/22/22 for the exam, diagnosis, procedures, and orders are all accurate and complete.  Inda Coke, PA-C

## 2022-12-22 NOTE — Patient Instructions (Addendum)
It was great to see you!  --Rocephin injection today --Start levaquin and prednisone today *Ask your other provider if they recommend 20-40 mg prednisone daily  Please go ahead and schedule close follow-up with your pulmonologist  If any worsening symptoms, please go to the ER  If no improvement, we can try to get a chest xray  Take care,  Inda Coke PA-C

## 2022-12-22 NOTE — Telephone Encounter (Signed)
Patient's wife states she contacted [facility] to let them know that steroids were prescribed for Patient. States Dr. Aubery Lapping will get back to Patient if there is any issue.

## 2022-12-26 ENCOUNTER — Encounter: Payer: Self-pay | Admitting: Primary Care

## 2022-12-26 ENCOUNTER — Ambulatory Visit (INDEPENDENT_AMBULATORY_CARE_PROVIDER_SITE_OTHER): Payer: Medicare Other

## 2022-12-26 ENCOUNTER — Ambulatory Visit: Payer: Medicare Other | Admitting: Primary Care

## 2022-12-26 VITALS — BP 118/78 | HR 86 | Temp 98.2°F | Ht 70.0 in | Wt 259.2 lb

## 2022-12-26 DIAGNOSIS — J40 Bronchitis, not specified as acute or chronic: Secondary | ICD-10-CM

## 2022-12-26 DIAGNOSIS — J209 Acute bronchitis, unspecified: Secondary | ICD-10-CM | POA: Diagnosis not present

## 2022-12-26 DIAGNOSIS — J9611 Chronic respiratory failure with hypoxia: Secondary | ICD-10-CM

## 2022-12-26 DIAGNOSIS — J449 Chronic obstructive pulmonary disease, unspecified: Secondary | ICD-10-CM | POA: Diagnosis not present

## 2022-12-26 DIAGNOSIS — J9811 Atelectasis: Secondary | ICD-10-CM | POA: Diagnosis not present

## 2022-12-26 MED ORDER — IPRATROPIUM-ALBUTEROL 0.5-2.5 (3) MG/3ML IN SOLN: 3.0000 mL | Freq: Four times a day (QID) | RESPIRATORY_TRACT | 3 refills | Status: AC | PRN

## 2022-12-26 MED ORDER — PREDNISONE 20 MG PO TABS: ORAL_TABLET | ORAL | 0 refills | Status: DC

## 2022-12-26 MED ORDER — LEVOFLOXACIN 500 MG PO TABS: ORAL_TABLET | ORAL | 0 refills | Status: DC

## 2022-12-26 MED ORDER — SODIUM CHLORIDE 3 % IN NEBU: INHALATION_SOLUTION | RESPIRATORY_TRACT | 0 refills | Status: AC | PRN

## 2022-12-26 NOTE — Progress Notes (Signed)
Please let patient know CXR unchanged from previous imaging in Dec 2022. 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  Difficult to exclude superimposed airspace disease on chronic lung findings, however there is no overt pneumonia.  We will extend Levaquin 500 mg daily additional 3 days

## 2022-12-26 NOTE — Assessment & Plan Note (Signed)
-  Stable; Baseline oxygen requirements are between 3-5L

## 2022-12-26 NOTE — Patient Instructions (Addendum)
Recommendations: Start mucinex 1200mg  twice daily for 7-10 days Use duoneb 3 times a day (morning, afternoon and evening) for the next week  Use flutter valve 3 times a day after nebulizer  Extending prednisone 40mg  x 5 days (will send in RX) Likely will be also extending levaquin as well depending on CXR Continue oxygen and BIPAP   Refill: Ipratropium-albuterol q 6 hours as needed for shortness of breath   Follow-up: 7 days with Dr. [name] or Eustaquio Maize NP

## 2022-12-26 NOTE — Assessment & Plan Note (Addendum)
-  Treated for suspected PNA on 12/22/22 with PCP. Currently on Levaquin 500mg  daily x 7 days and Prednisone 40mg  x 5 days. He has seen little improvement. Continues to have np congested cough. He has rhonchi with associated wheezing on exam, R>L. CXR today without acute infiltrate, stable chronic findings. Advised he take 1,200mg  twice daily until cough is better. Recommend he use ipratropium-albuterol nebulizer TID followed by flutter valve. Sending in RX hypertonic saline to use PRN for coug/congestion, extending Levaquin course additional 3 days and prednisone additional 5 days. FU in 1 week or sooner if needed.

## 2022-12-26 NOTE — Progress Notes (Signed)
@Patient  ID: , male    DOB: [birth year], 82 y.o.   MRN: [id number]  Chief Complaint  Patient presents with   Follow-up    Pneumonia seen 12/22/2022 SOB,wheezing, cougong O2 4-6 l    Referring provider: 12/24/2022, PA  HPI: 82 year old male, former smoker quit 1985 (75-pack-year history).  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 (on chronic anticoagulation), OSA on BiPAP, CHF, chronic pain.  Patient of Dr. 2003.  12/26/2022 Patient presents today for acute OV. He was seen on 12/22/21 at family medicine for acute cough. Flu and covid swabs were negative. He was treated for presumed pneumonia. He received 1 gram Rocephin injections and was started on Levaquin and prednisone 40mg  x 5 days.   Presents today for follow-up PNA. He continues to have a cough, which he feels is some better. Cough is congested but non-productive. He is having a hard time getting phelgm up. He feels tired. He is not on maintenance inhaler. Using albuterol inhaler 2-3 times a day. Needs refill ipratropium-albuterol nebulizer. He is using 4-6L of oxygen with exertion. He is compliant with BIPAP with 4L oxygen. Opthalmology said it was ok to be on prednisone for 2 weeks, if longer will need visit with him.   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   Diverticula, bladder    Erectile  dysfunction    GERD (gastroesophageal reflux disease)    Kidney stones    Obesity    OSA (obstructive sleep apnea)    Pulmonary embolism (HCC)     Tobacco History: Social History   Tobacco Use  Smoking Status Former   Packs/day: 3.00   Years: 25.00   Total pack years: 75.00   Types: Cigarettes   Quit date: 11/28/1984   Years since quitting: 38.1  Smokeless Tobacco Never   Counseling given: Not Answered   Outpatient Medications Prior to Visit  Medication Sig Dispense Refill   Albuterol Sulfate (PROAIR RESPICLICK) 108 (90 Base) MCG/ACT AEPB Inhale 2 puffs into the lungs every 6 (six) hours as needed.     Apremilast (OTEZLA) 30 MG TABS Take 30 mg by mouth 2 (two) times daily.     Ascorbic Acid (VITAMIN C) 500 MG CAPS Take 500 mg by mouth daily.     Atropine Sulfate-NaCl 0.01-0.9 % SOLN Place 1 drop into the left eye at bedtime.     augmented betamethasone dipropionate (DIPROLENE-AF) 0.05 % cream Apply topically.     bimatoprost (LUMIGAN) 0.01 % SOLN Place 1 drop into the right eye at bedtime.     budesonide-formoterol (SYMBICORT) 160-4.5 MCG/ACT inhaler Inhale 2 puffs into the lungs 2 (two) times daily.     Cholecalciferol (VITAMIN D3) 3000 units TABS Take 3,000 Units by mouth daily.     clobetasol (TEMOVATE) 0.05 % external solution APPLY TOPICALLY TO AFFECTED AREA ON SCALP 1 TO 2 TIMES DAILY AS NEEDED 150 mL 0  desonide (DESOWEN) 0.05 % lotion Apply 1 application topically daily as needed (itching).     docusate sodium (COLACE) 100 MG capsule Take 100 mg by mouth 2 (two) times daily.     ELIQUIS 5 MG TABS tablet TAKE 1 TABLET BY MOUTH TWICE A DAY 180 tablet 1   erythromycin ophthalmic ointment Place 1 application into both eyes at bedtime.  1   fluticasone (CUTIVATE) 0.05 % cream Apply 1 Application topically daily.     ketorolac (ACULAR) 0.5 % ophthalmic solution Place 1 drop into both eyes 4 (four) times daily as needed (irritation).     magnesium oxide (MAG-OX) 400 (240  Mg) MG tablet TAKE 1 TABLET BY MOUTH TWICE A DAY 180 tablet 2   metoprolol succinate (TOPROL-XL) 25 MG 24 hr tablet Take 1 tablet (25 mg total) by mouth daily. 90 tablet 3   mometasone (NASONEX) 50 MCG/ACT nasal spray Place 2 sprays into the nose daily. (Patient taking differently: Place 2 sprays into the nose daily as needed (congestion).) 51 g 2   NARCAN 4 MG/0.1ML LIQD nasal spray kit 1 spray once as needed (overdose).  0   nystatin ointment (MYCOSTATIN) Apply 1 application topically 2 (two) times daily. (Patient taking differently: Apply 1 application  topically daily as needed (skinn irritation).) 30 g 1   oxyCODONE (OXY IR/ROXICODONE) 5 MG immediate release tablet Take 5 mg by mouth 3 (three) times daily as needed for severe pain. for pain  0   polyethylene glycol (MIRALAX / GLYCOLAX) 17 g packet Take by mouth.     potassium chloride (KLOR-CON) 10 MEQ tablet Take 2 tablets in the AM and one in the PM. 30 tablet 0   prednisoLONE acetate (PRED FORTE) 1 % ophthalmic suspension Place 1 drop into the left eye at bedtime.     pregabalin (LYRICA) 50 MG capsule Take 50 mg by mouth 2 (two) times daily.     spironolactone (ALDACTONE) 25 MG tablet Take 12.5 mg by mouth daily.     tadalafil (CIALIS) 20 MG tablet TAKE 1/2 TO 1 TABLET BY MOUTH EVERY OTHER DAY AS NEEDED FOR ERECTILE DYSFUNCTION 5 tablet 11   tamsulosin (FLOMAX) 0.4 MG CAPS capsule Take 1 capsule by mouth at bedtime 80 capsule 3   temazepam (RESTORIL) 15 MG capsule TAKE 1 CAPSULE BY MOUTH AT BEDTIME AS NEEDED FOR SLEEP 90 capsule 0   torsemide (DEMADEX) 20 MG tablet Take 2 tablets by mouth 2 (two) times daily.     TRIAMCINOLONE PO Apply 1 application topically daily as needed (itching).     zinc gluconate 50 MG tablet Take 50 mg by mouth daily.     doxycycline (VIBRAMYCIN) 100 MG capsule Take 1 capsule by mouth twice daily 180 capsule 0   ipratropium-albuterol (DUONEB) 0.5-2.5 (3) MG/3ML SOLN Take 3 mLs by nebulization every 6 (six) hours as  needed. (Patient taking differently: Take 3 mLs by nebulization every 6 (six) hours as needed (wheezing).) 360 mL 3   levofloxacin (LEVAQUIN) 500 MG tablet Take 1 tablet (500 mg total) by mouth daily for 7 days. 7 tablet 0   predniSONE (DELTASONE) 20 MG tablet Take 2 tablets (40 mg total) by mouth daily. 10 tablet 0   No facility-administered medications prior to visit.   Review of Systems  Review of Systems  Constitutional:  Positive for fatigue.  HENT:  Positive for congestion.   Respiratory:  Positive for cough and wheezing.   Cardiovascular: Negative.  Negative for leg swelling.  Physical Exam  BP 118/78 (BP Location: Left Arm, Patient Position: Sitting, Cuff Size: Large)   Pulse 86   Temp 98.2 F (36.8 C) (Oral)   Ht 5\' 10"  (1.778 m)   Wt 259 lb 3.2 oz (117.6 kg)   SpO2 100%   BMI 37.19 kg/m  Physical Exam Constitutional:      Appearance: Normal appearance.  HENT:     Head: Normocephalic and atraumatic.     Mouth/Throat:     Mouth: Mucous membranes are moist.     Pharynx: Oropharynx is clear.  Cardiovascular:     Rate and Rhythm: Normal rate and regular rhythm.  Pulmonary:     Effort: Pulmonary effort is normal.     Breath sounds: Wheezing and rhonchi present.  Musculoskeletal:        General: Normal range of motion.  Skin:    General: Skin is warm and dry.  Neurological:     General: No focal deficit present.     Mental Status: He is alert and oriented to person, place, and time. Mental status is at baseline.  Psychiatric:        Mood and Affect: Mood normal.        Behavior: Behavior normal.        Thought Content: Thought content normal.        Judgment: Judgment normal.      Lab Results:  CBC    Component Value Date/Time   WBC 6.4 09/16/2022 0000   WBC 8.7 08/09/2022 1456   RBC 3.76 (A) 09/16/2022 0000   HGB 13.5 09/16/2022 0000   HCT 40 (A) 09/16/2022 0000   PLT 224 09/16/2022 0000   MCV 104.4 (H) 08/09/2022 1456   MCH 34.0 11/17/2021 0440    MCHC 33.5 08/09/2022 1456   RDW 13.8 08/09/2022 1456   LYMPHSABS 1.8 08/09/2022 1456   MONOABS 1.1 (H) 08/09/2022 1456   EOSABS 0.1 08/09/2022 1456   BASOSABS 0.1 08/09/2022 1456    BMET    Component Value Date/Time   NA 140 10/13/2022 1048   NA 141 09/16/2022 0000   K 4.3 10/13/2022 1048   CL 94 (L) 10/13/2022 1048   CO2 41 (H) 10/13/2022 1048   GLUCOSE 100 (H) 10/13/2022 1048   BUN 19 10/13/2022 1048   BUN 23 (A) 09/16/2022 0000   CREATININE 1.06 10/13/2022 1048   CREATININE 0.90 07/22/2020 1201   CALCIUM 9.6 10/13/2022 1048   GFRNONAA >60 11/19/2021 1350   GFRAA 92 05/22/2020 1142    BNP    Component Value Date/Time   BNP 120.9 (H) 11/12/2021 1931   BNP 67 12/13/2019 1512    ProBNP    Component Value Date/Time   PROBNP 646 (H) 10/13/2022 1048    Imaging: DG Chest 2 View  Result Date: 12/26/2022 CLINICAL DATA:  Pneumonia EXAM: CHEST - 2 VIEW COMPARISON:  Chest CT and radiograph 11/16/2021 FINDINGS: Stable enlarged cardiac silhouette. There is prominent pleural calcifications again noted. Blunting of the LEFT costophrenic angle. Bibasilar atelectasis. No significant change from comparison exam. IMPRESSION: 1. Bilateral pleural calcifications and basilar atelectasis. 2. No significant change comparison exam. Difficult to exclude superimposed airspace disease on the chronic lung findings. Electronically Signed   By: 11/18/2021 M.D.   On: 12/26/2022 15:31     Assessment & Plan:   Acute bronchitis - Treated for suspected PNA on 12/22/22 with PCP. Currently on Levaquin 500mg  daily x 7 days and Prednisone 40mg  x 5 days. He has seen  little improvement. Continues to have np congested cough. He has rhonchi with associated wheezing on exam, R>L. CXR today without acute infiltrate, stable chronic findings. Advised he take 1,200mg  twice daily until cough is better. Recommend he use ipratropium-albuterol nebulizer TID followed by flutter valve. Sending in RX hypertonic  saline to use PRN for coug/congestion, extending Levaquin course additional 3 days and prednisone additional 5 days. FU in 1 week or sooner if needed.    Martyn Ehrich, NP 12/26/2022

## 2022-12-26 NOTE — Telephone Encounter (Signed)
Attempted to call pt but unable to reach. Left message to return call.

## 2023-01-04 NOTE — Progress Notes (Deleted)
$'@Patient'S$  ID: Maurice Reed, male    DOB: 1941/06/05, 82 y.o.   MRN: [id number]  No chief complaint on file.   Referring provider: Inda Coke, PA  HPI: 82 year old male, former smoker quit 1985 (75-pack-year history).  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 (on chronic anticoagulation), OSA on BiPAP, CHF, chronic pain.  Patient of Dr. [name].  Previous [facility] pulmonary encounter:  12/26/2022 Patient presents today for acute OV. He was seen on 12/22/21 at family medicine for acute cough. Flu and covid swabs were negative. He was treated for presumed pneumonia. He received 1 gram Rocephin injections and was started on Levaquin and prednisone '40mg'$  x 5 days.   Presents today for follow-up PNA. He continues to have a cough, which he feels is some better. Cough is congested but non-productive. He is having a hard time getting phelgm up. He feels tired. He is not on maintenance inhaler. Using albuterol inhaler 2-3 times a day. Needs refill ipratropium-albuterol nebulizer. He is using 4-6L of oxygen with exertion. He is compliant with BIPAP with 4L oxygen. Opthalmology said it was ok to be on prednisone for 2 weeks, if longer will need visit with him.    Acute bronchitis - Treated for suspected PNA on 12/22/22 with PCP. Currently on Levaquin '500mg'$  daily x 7 days and Prednisone '40mg'$  x 5 days. He has seen little improvement. Continues to have np congested cough. He has rhonchi with associated wheezing on exam, R>L. CXR today without acute infiltrate, stable chronic findings. Advised he take 1,'200mg'$  twice daily until cough is better. Recommend he use ipratropium-albuterol nebulizer TID followed by flutter valve. Sending in RX hypertonic saline to use PRN for coug/congestion, extending Levaquin course additional 3 days and prednisone additional 5 days. FU in 1 week or sooner if needed.      01/05/2023 Patient presents today for 1 week follow-up.        Allergies  Allergen Reactions   Other Itching    Cats: watery eyes, itching, sneezing    Immunization History  Administered Date(s) Administered   Fluad Quad(high Dose 65+) 08/23/2019, 09/08/2020, 10/01/2021, 09/12/2022   Influenza, High Dose Seasonal PF 09/21/2018   Influenza, Seasonal, Injecte, Preservative Fre 09/28/2017   Pneumococcal Conjugate-13 12/22/2016   Pneumococcal Polysaccharide-23 12/14/2015    Past Medical History:  Diagnosis Date   Arthritis    Atrial fibrillation ([place])    Blind left eye 2005   was a result of cataract surgery   Diverticula, bladder    Erectile dysfunction    GERD (gastroesophageal reflux disease)    Kidney stones    Obesity    OSA (obstructive sleep apnea)    Pulmonary embolism ([facility])     Tobacco History: Social History   Tobacco Use  Smoking Status Former   Packs/day: 3.00   Years: 25.00   Total pack years: 75.00   Types: Cigarettes   Quit date: 11/28/1984   Years since quitting: 38.1  Smokeless Tobacco Never   Counseling given: Not Answered   Outpatient Medications Prior to Visit  Medication Sig Dispense Refill   Albuterol Sulfate (PROAIR RESPICLICK) 123XX123 (90 Base) MCG/ACT AEPB Inhale 2 puffs into the lungs every 6 (six) hours as needed.     Apremilast (OTEZLA) 30 MG TABS Take 30 mg by mouth 2 (two) times daily.     Ascorbic Acid (VITAMIN C) 500 MG CAPS Take 500 mg by mouth daily.  Atropine Sulfate-NaCl 0.01-0.9 % SOLN Place 1 drop into the left eye at bedtime.     augmented betamethasone dipropionate (DIPROLENE-AF) 0.05 % cream Apply topically.     bimatoprost (LUMIGAN) 0.01 % SOLN Place 1 drop into the right eye at bedtime.     budesonide-formoterol (SYMBICORT) 160-4.5 MCG/ACT inhaler Inhale 2 puffs into the lungs 2 (two) times daily.     Cholecalciferol (VITAMIN D3) 3000 units TABS Take 3,000 Units by mouth daily.     clobetasol (TEMOVATE) 0.05  % external solution APPLY TOPICALLY TO AFFECTED AREA ON SCALP 1 TO 2 TIMES DAILY AS NEEDED 150 mL 0   desonide (DESOWEN) 0.05 % lotion Apply 1 application topically daily as needed (itching).     docusate sodium (COLACE) 100 MG capsule Take 100 mg by mouth 2 (two) times daily.     ELIQUIS 5 MG TABS tablet TAKE 1 TABLET BY MOUTH TWICE A DAY 180 tablet 1   erythromycin ophthalmic ointment Place 1 application into both eyes at bedtime.  1   fluticasone (CUTIVATE) 0.05 % cream Apply 1 Application topically daily.     ipratropium-albuterol (DUONEB) 0.5-2.5 (3) MG/3ML SOLN Take 3 mLs by nebulization every 6 (six) hours as needed. 360 mL 3   ketorolac (ACULAR) 0.5 % ophthalmic solution Place 1 drop into both eyes 4 (four) times daily as needed (irritation).     levofloxacin (LEVAQUIN) 500 MG tablet Take 1 tab daily for additional 3 days (total 10 days) 3 tablet 0   magnesium oxide (MAG-OX) 400 (240 Mg) MG tablet TAKE 1 TABLET BY MOUTH TWICE A DAY 180 tablet 2   metoprolol succinate (TOPROL-XL) 25 MG 24 hr tablet Take 1 tablet (25 mg total) by mouth daily. 90 tablet 3   mometasone (NASONEX) 50 MCG/ACT nasal spray Place 2 sprays into the nose daily. (Patient taking differently: Place 2 sprays into the nose daily as needed (congestion).) 51 g 2   NARCAN 4 MG/0.1ML LIQD nasal spray kit 1 spray once as needed (overdose).  0   nystatin ointment (MYCOSTATIN) Apply 1 application topically 2 (two) times daily. (Patient taking differently: Apply 1 application  topically daily as needed (skinn irritation).) 30 g 1   oxyCODONE (OXY IR/ROXICODONE) 5 MG immediate release tablet Take 5 mg by mouth 3 (three) times daily as needed for severe pain. for pain  0   polyethylene glycol (MIRALAX / GLYCOLAX) 17 g packet Take by mouth.     potassium chloride (KLOR-CON) 10 MEQ tablet Take 2 tablets in the AM and one in the PM. 30 tablet 0   prednisoLONE acetate (PRED FORTE) 1 % ophthalmic suspension Place 1 drop into the left eye  at bedtime.     predniSONE (DELTASONE) 20 MG tablet Take '40mg'$  daily x 5 additional days 10 tablet 0   pregabalin (LYRICA) 50 MG capsule Take 50 mg by mouth 2 (two) times daily.     sodium chloride HYPERTONIC 3 % nebulizer solution Take by nebulization as needed for cough. 240 mL 0   spironolactone (ALDACTONE) 25 MG tablet Take 12.5 mg by mouth daily.     tadalafil (CIALIS) 20 MG tablet TAKE 1/2 TO 1 TABLET BY MOUTH EVERY OTHER DAY AS NEEDED FOR ERECTILE DYSFUNCTION 5 tablet 11   tamsulosin (FLOMAX) 0.4 MG CAPS capsule Take 1 capsule by mouth at bedtime 80 capsule 3   temazepam (RESTORIL) 15 MG capsule TAKE 1 CAPSULE BY MOUTH AT BEDTIME AS NEEDED FOR SLEEP 90 capsule 0   torsemide (  DEMADEX) 20 MG tablet Take 2 tablets by mouth 2 (two) times daily.     TRIAMCINOLONE PO Apply 1 application topically daily as needed (itching).     zinc gluconate 50 MG tablet Take 50 mg by mouth daily.     No facility-administered medications prior to visit.      Review of Systems  Review of Systems   Physical Exam  There were no vitals taken for this visit. Physical Exam   Lab Results:  CBC    Component Value Date/Time   WBC 6.4 09/16/2022 0000   WBC 8.7 08/09/2022 1456   RBC 3.76 (A) 09/16/2022 0000   HGB 13.5 09/16/2022 0000   HCT 40 (A) 09/16/2022 0000   PLT 224 09/16/2022 0000   MCV 104.4 (H) 08/09/2022 1456   MCH 34.0 11/17/2021 0440   MCHC 33.5 08/09/2022 1456   RDW 13.8 08/09/2022 1456   LYMPHSABS 1.8 08/09/2022 1456   MONOABS 1.1 (H) 08/09/2022 1456   EOSABS 0.1 08/09/2022 1456   BASOSABS 0.1 08/09/2022 1456    BMET    Component Value Date/Time   NA 140 10/13/2022 1048   NA 141 09/16/2022 0000   K 4.3 10/13/2022 1048   CL 94 (L) 10/13/2022 1048   CO2 41 (H) 10/13/2022 1048   GLUCOSE 100 (H) 10/13/2022 1048   BUN 19 10/13/2022 1048   BUN 23 (A) 09/16/2022 0000   CREATININE 1.06 10/13/2022 1048   CREATININE 0.90 07/22/2020 1201   CALCIUM 9.6 10/13/2022 1048   GFRNONAA  >60 11/19/2021 1350   GFRAA 92 05/22/2020 1142    BNP    Component Value Date/Time   BNP 120.9 (H) 11/12/2021 1931   BNP 67 12/13/2019 1512    ProBNP    Component Value Date/Time   PROBNP 646 (H) 10/13/2022 1048    Imaging: DG Chest 2 View  Result Date: 12/26/2022 CLINICAL DATA:  Pneumonia EXAM: CHEST - 2 VIEW COMPARISON:  Chest CT and radiograph 11/16/2021 FINDINGS: Stable enlarged cardiac silhouette. There is prominent pleural calcifications again noted. Blunting of the LEFT costophrenic angle. Bibasilar atelectasis. No significant change from comparison exam. IMPRESSION: 1. Bilateral pleural calcifications and basilar atelectasis. 2. No significant change comparison exam. Difficult to exclude superimposed airspace disease on the chronic lung findings. Electronically Signed   By: Suzy Bouchard M.D.   On: 12/26/2022 15:31     Assessment & Plan:   No problem-specific Assessment & Plan notes found for this encounter.     Martyn Ehrich, NP 01/04/2023

## 2023-01-05 ENCOUNTER — Ambulatory Visit: Payer: Medicare Other | Admitting: Primary Care

## 2023-01-10 ENCOUNTER — Ambulatory Visit: Payer: Medicare Other | Admitting: Family Medicine

## 2023-01-10 ENCOUNTER — Ambulatory Visit: Payer: Medicare Other | Admitting: Physician Assistant

## 2023-01-10 DIAGNOSIS — Z515 Encounter for palliative care: Secondary | ICD-10-CM | POA: Diagnosis not present

## 2023-01-10 DIAGNOSIS — R030 Elevated blood-pressure reading, without diagnosis of hypertension: Secondary | ICD-10-CM | POA: Diagnosis not present

## 2023-01-10 DIAGNOSIS — Z9181 History of falling: Secondary | ICD-10-CM | POA: Diagnosis not present

## 2023-01-10 DIAGNOSIS — M539 Dorsopathy, unspecified: Secondary | ICD-10-CM | POA: Diagnosis not present

## 2023-01-10 DIAGNOSIS — G894 Chronic pain syndrome: Secondary | ICD-10-CM | POA: Diagnosis not present

## 2023-01-10 DIAGNOSIS — Z79899 Other long term (current) drug therapy: Secondary | ICD-10-CM | POA: Diagnosis not present

## 2023-01-10 DIAGNOSIS — G4733 Obstructive sleep apnea (adult) (pediatric): Secondary | ICD-10-CM | POA: Diagnosis not present

## 2023-01-16 ENCOUNTER — Other Ambulatory Visit: Payer: Self-pay | Admitting: Physician Assistant

## 2023-01-16 NOTE — Progress Notes (Signed)
Maurice Reed is a 82 y.o. male here for a follow up of a pre-existing problem.  History of Present Illness:   No chief complaint on file.   HPI  LVM   Bronchitis   Chronic Respiratory Failure with Hypoxia    Past Medical History:  Diagnosis Date   Arthritis    Atrial fibrillation ([place])    Blind left eye 2005   was a result of cataract surgery   Diverticula, bladder    Erectile dysfunction    GERD (gastroesophageal reflux disease)    Kidney stones    Obesity    OSA (obstructive sleep apnea)    Pulmonary embolism ([facility])      Social History   Tobacco Use   Smoking status: Former    Packs/day: 3.00    Years: 25.00    Total pack years: 75.00    Types: Cigarettes    Quit date: 11/28/1984    Years since quitting: 38.1   Smokeless tobacco: Never  Vaping Use   Vaping Use: Never used  Substance Use Topics   Alcohol use: Yes    Alcohol/week: 1.0 standard drink of alcohol    Types: 1 Glasses of wine per week    Comment: at times   Drug use: Not Currently    Types: Marijuana    Comment: Gummies     Past Surgical History:  Procedure Laterality Date   APPENDECTOMY  1963   LITHOTRIPSY  1987   NOSE SURGERY  2003   TONSILLECTOMY AND ADENOIDECTOMY  [id number]   UMBILICAL HERNIA REPAIR  1984    Family History  Problem Relation Age of Onset   Arthritis Mother    Hearing loss Mother    Hypertension Mother    Heart disease Mother    Stroke Mother    Arthritis Father    Hearing loss Father    Heart disease Father    Hypertension Father    Heart attack Father    Kidney disease Father    Stroke Father     Allergies  Allergen Reactions   Other Itching    Cats: watery eyes, itching, sneezing    Current Medications:   Current Outpatient Medications:    Albuterol Sulfate (PROAIR RESPICLICK) 123XX123 (90 Base) MCG/ACT AEPB, Inhale 2 puffs into the lungs every 6 (six) hours as needed., Disp: , Rfl:    Apremilast (OTEZLA) 30 MG TABS, Take 30 mg by mouth 2 (two) times  daily., Disp: , Rfl:    Ascorbic Acid (VITAMIN C) 500 MG CAPS, Take 500 mg by mouth daily., Disp: , Rfl:    Atropine Sulfate-NaCl 0.01-0.9 % SOLN, Place 1 drop into the left eye at bedtime., Disp: , Rfl:    augmented betamethasone dipropionate (DIPROLENE-AF) 0.05 % cream, Apply topically., Disp: , Rfl:    bimatoprost (LUMIGAN) 0.01 % SOLN, Place 1 drop into the right eye at bedtime., Disp: , Rfl:    budesonide-formoterol (SYMBICORT) 160-4.5 MCG/ACT inhaler, Inhale 2 puffs into the lungs 2 (two) times daily., Disp: , Rfl:    Cholecalciferol (VITAMIN D3) 3000 units TABS, Take 3,000 Units by mouth daily., Disp: , Rfl:    clobetasol (TEMOVATE) 0.05 % external solution, APPLY TOPICALLY TO AFFECTED AREA ON SCALP 1 TO 2 TIMES DAILY AS NEEDED, Disp: 150 mL, Rfl: 0   desonide (DESOWEN) 0.05 % lotion, Apply 1 application topically daily as needed (itching)., Disp: , Rfl:    docusate sodium (COLACE) 100 MG capsule, Take 100 mg by mouth 2 (two)  times daily., Disp: , Rfl:    ELIQUIS 5 MG TABS tablet, TAKE 1 TABLET BY MOUTH TWICE A DAY, Disp: 180 tablet, Rfl: 1   erythromycin ophthalmic ointment, Place 1 application into both eyes at bedtime., Disp: , Rfl: 1   fluticasone (CUTIVATE) 0.05 % cream, Apply 1 Application topically daily., Disp: , Rfl:    ipratropium-albuterol (DUONEB) 0.5-2.5 (3) MG/3ML SOLN, Take 3 mLs by nebulization every 6 (six) hours as needed., Disp: 360 mL, Rfl: 3   ketorolac (ACULAR) 0.5 % ophthalmic solution, Place 1 drop into both eyes 4 (four) times daily as needed (irritation)., Disp: , Rfl:    levofloxacin (LEVAQUIN) 500 MG tablet, Take 1 tab daily for additional 3 days (total 10 days), Disp: 3 tablet, Rfl: 0   magnesium oxide (MAG-OX) 400 (240 Mg) MG tablet, TAKE 1 TABLET BY MOUTH TWICE A DAY, Disp: 180 tablet, Rfl: 2   metoprolol succinate (TOPROL-XL) 25 MG 24 hr tablet, Take 1 tablet (25 mg total) by mouth daily., Disp: 90 tablet, Rfl: 3   mometasone (NASONEX) 50 MCG/ACT nasal spray,  Place 2 sprays into the nose daily. (Patient taking differently: Place 2 sprays into the nose daily as needed (congestion).), Disp: 51 g, Rfl: 2   NARCAN 4 MG/0.1ML LIQD nasal spray kit, 1 spray once as needed (overdose)., Disp: , Rfl: 0   nystatin ointment (MYCOSTATIN), Apply 1 application topically 2 (two) times daily. (Patient taking differently: Apply 1 application  topically daily as needed (skinn irritation).), Disp: 30 g, Rfl: 1   oxyCODONE (OXY IR/ROXICODONE) 5 MG immediate release tablet, Take 5 mg by mouth 3 (three) times daily as needed for severe pain. for pain, Disp: , Rfl: 0   polyethylene glycol (MIRALAX / GLYCOLAX) 17 g packet, Take by mouth., Disp: , Rfl:    potassium chloride (KLOR-CON) 10 MEQ tablet, Take 2 tablets in the AM and one in the PM., Disp: 30 tablet, Rfl: 0   prednisoLONE acetate (PRED FORTE) 1 % ophthalmic suspension, Place 1 drop into the left eye at bedtime., Disp: , Rfl:    predniSONE (DELTASONE) 20 MG tablet, Take 32m daily x 5 additional days, Disp: 10 tablet, Rfl: 0   pregabalin (LYRICA) 50 MG capsule, Take 50 mg by mouth 2 (two) times daily., Disp: , Rfl:    sodium chloride HYPERTONIC 3 % nebulizer solution, Take by nebulization as needed for cough., Disp: 240 mL, Rfl: 0   spironolactone (ALDACTONE) 25 MG tablet, Take 12.5 mg by mouth daily., Disp: , Rfl:    tadalafil (CIALIS) 20 MG tablet, TAKE 1/2 TO 1 TABLET BY MOUTH EVERY OTHER DAY AS NEEDED FOR ERECTILE DYSFUNCTION, Disp: 5 tablet, Rfl: 11   tamsulosin (FLOMAX) 0.4 MG CAPS capsule, Take 1 capsule by mouth at bedtime, Disp: 80 capsule, Rfl: 3   temazepam (RESTORIL) 15 MG capsule, TAKE 1 CAPSULE BY MOUTH AT BEDTIME AS NEEDED FOR SLEEP, Disp: 90 capsule, Rfl: 0   torsemide (DEMADEX) 20 MG tablet, Take 2 tablets by mouth 2 (two) times daily., Disp: , Rfl:    TRIAMCINOLONE PO, Apply 1 application topically daily as needed (itching)., Disp: , Rfl:    zinc gluconate 50 MG tablet, Take 50 mg by mouth daily., Disp:  , Rfl:    Review of Systems:   ROS  Vitals:   There were no vitals filed for this visit.   There is no height or weight on file to calculate BMI.  Physical Exam:   Physical Exam  Assessment and  Plan:   ***   I,Alexander Ruley,acting as a scribe for [employer], PA.,have documented all relevant documentation on the behalf of Inda Coke, PA,as directed by  Inda Coke, PA while in the presence of Inda Coke, [place].   ***   Inda Coke, PA-C

## 2023-01-18 ENCOUNTER — Encounter: Payer: Self-pay | Admitting: Primary Care

## 2023-01-18 ENCOUNTER — Encounter: Payer: Self-pay | Admitting: Physician Assistant

## 2023-01-18 ENCOUNTER — Ambulatory Visit: Payer: Medicare Other | Admitting: Primary Care

## 2023-01-18 ENCOUNTER — Ambulatory Visit (INDEPENDENT_AMBULATORY_CARE_PROVIDER_SITE_OTHER): Payer: Medicare Other | Admitting: Physician Assistant

## 2023-01-18 VITALS — BP 110/60 | HR 62 | Temp 98.0°F | Ht 70.0 in | Wt 260.0 lb

## 2023-01-18 VITALS — BP 120/80 | HR 79 | Ht 70.0 in | Wt 260.0 lb

## 2023-01-18 DIAGNOSIS — I5032 Chronic diastolic (congestive) heart failure: Secondary | ICD-10-CM | POA: Diagnosis not present

## 2023-01-18 DIAGNOSIS — J849 Interstitial pulmonary disease, unspecified: Secondary | ICD-10-CM

## 2023-01-18 DIAGNOSIS — R5381 Other malaise: Secondary | ICD-10-CM | POA: Diagnosis not present

## 2023-01-18 DIAGNOSIS — J9611 Chronic respiratory failure with hypoxia: Secondary | ICD-10-CM | POA: Diagnosis not present

## 2023-01-18 DIAGNOSIS — G4733 Obstructive sleep apnea (adult) (pediatric): Secondary | ICD-10-CM

## 2023-01-18 DIAGNOSIS — J209 Acute bronchitis, unspecified: Secondary | ICD-10-CM

## 2023-01-18 DIAGNOSIS — I4821 Permanent atrial fibrillation: Secondary | ICD-10-CM | POA: Diagnosis not present

## 2023-01-18 DIAGNOSIS — R052 Subacute cough: Secondary | ICD-10-CM | POA: Diagnosis not present

## 2023-01-18 NOTE — Assessment & Plan Note (Signed)
-   Stable; Baseline oxygen requirements are between 3-5L

## 2023-01-18 NOTE — Patient Instructions (Addendum)
It was great to see you!  Wheelchair order  Hospice referral placed  Follow-up with pulmonary this afternoon as directed.  Take care,  Inda Coke PA-C

## 2023-01-18 NOTE — Assessment & Plan Note (Signed)
-   Increased dyspnea symptoms last several weeks along with general physical decline. Unclear if this is related to his underlying lung disease or other co-morbidities. Recommend getting updated chest imaging and pulmonary function testing to get a netter sense of this. FU virtual visit after testing is completed to review results.

## 2023-01-18 NOTE — Assessment & Plan Note (Addendum)
-   Improved; Cough is significantly better - Completed 10 days of Levaquin and additional prednisone course  - Continue mucolytic's, flutter valve, hypertonic saline neb prn and bronchodilators

## 2023-01-18 NOTE — Assessment & Plan Note (Signed)
-   Patient is 100% compliant with BIPAP use last 30 days - No changes, encourage continued use

## 2023-01-18 NOTE — Progress Notes (Signed)
$@PatientQ$  ID: Maurice Reed, male    DOB: 1940/12/11, 82 y.o.   MRN: [id number]  Chief Complaint  Patient presents with   Follow-up    Pneumonia    Referring provider: Inda Coke, PA  HPI: 82 year old male, former smoker quit 1985 (75-pack-year history).  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 (on chronic anticoagulation), OSA on BiPAP, CHF, chronic pain.  Patient of Dr. [name].  Previous [facility] pulmonary encounter:  12/26/2022 Patient presents today for acute OV. He was seen on 12/22/21 at family medicine for acute cough. Flu and covid swabs were negative. He was treated for presumed pneumonia. He received 1 gram Rocephin injections and was started on Levaquin and prednisone 68m x 5 days.   Presents today for follow-up PNA. He continues to have a cough, which he feels is some better. Cough is congested but non-productive. He is having a hard time getting phelgm up. He feels tired. He is not on maintenance inhaler. Using albuterol inhaler 2-3 times a day. Needs refill ipratropium-albuterol nebulizer. He is using 4-6L of oxygen with exertion. He is compliant with BIPAP with 4L oxygen. Opthalmology said it was ok to be on prednisone for 2 weeks, if longer will need visit with him.    Acute bronchitis - Treated for suspected PNA on 12/22/22 with PCP. Currently on Levaquin 502mdaily x 7 days and Prednisone 4025m 5 days. He has seen little improvement. Continues to have np congested cough. He has rhonchi with associated wheezing on exam, R>L. CXR today without acute infiltrate, stable chronic findings. Advised he take 1,200m47mice daily until cough is better. Recommend he use ipratropium-albuterol nebulizer TID followed by flutter valve. Sending in RX hypertonic saline to use PRN for coug/congestion, extending Levaquin course additional 3 days and prednisone additional 5 days. FU in 1 week or  sooner if needed.   01/18/2023 Patient presents today for 1 week follow-up. Treated for suspected PNA on 12/22/22 with PCP. During our last visit he was not feeling better, he had a persistent np cough with rhonchi and wheezing on exam. CXR without acute findings. We extended his antibiotics and prednisone and also started him on hypertonic saline nebulizer to loosen congestion. Today he is feeling a good deal better. His cough has improved significantly. Completed prednisone course. Breathing wise he has been more short winded last 4 weeks. He is still weak/tired. Wife feels overall his health has declined last several months, PCP referred him to hospice.   Airview download 12/19/22-01/17/23 Usage 30/30 days; 97% > 4 hours Average usage 11 hours 5 min Pressure 16/10cm h20 Airleaks 26L/min (95%) AHI 2.0  Allergies  Allergen Reactions   Other Itching    Cats: watery eyes, itching, sneezing    Immunization History  Administered Date(s) Administered   Fluad Quad(high Dose 65+) 08/23/2019, 09/08/2020, 10/01/2021, 09/12/2022   Influenza, High Dose Seasonal PF 09/21/2018   Influenza, Seasonal, Injecte, Preservative Fre 09/28/2017   Pneumococcal Conjugate-13 12/22/2016   Pneumococcal Polysaccharide-23 12/14/2015    Past Medical History:  Diagnosis Date   Arthritis    Atrial fibrillation (HCC)[place] left eye 2005   was a result of cataract surgery   Diverticula, bladder    Erectile dysfunction    GERD (gastroesophageal reflux disease)    Kidney stones    Obesity    OSA (obstructive sleep apnea)    Pulmonary embolism (HCC)[place]  Tobacco History: Social History   Tobacco Use  Smoking Status Former   Packs/day: 3.00   Years: 25.00   Total pack years: 75.00   Types: Cigarettes   Quit date: 11/28/1984   Years since quitting: 38.1  Smokeless Tobacco Never   Counseling given: Not Answered   Outpatient Medications Prior to Visit  Medication Sig Dispense Refill   Albuterol  Sulfate (PROAIR RESPICLICK) 123XX123 (90 Base) MCG/ACT AEPB Inhale 2 puffs into the lungs every 6 (six) hours as needed.     Ascorbic Acid (VITAMIN C) 500 MG CAPS Take 500 mg by mouth daily.     Atropine Sulfate-NaCl 0.01-0.9 % SOLN Place 1 drop into the left eye at bedtime.     augmented betamethasone dipropionate (DIPROLENE-AF) 0.05 % cream Apply topically.     bimatoprost (LUMIGAN) 0.01 % SOLN Place 1 drop into the right eye at bedtime.     budesonide-formoterol (SYMBICORT) 160-4.5 MCG/ACT inhaler Inhale 2 puffs into the lungs 2 (two) times daily.     Cholecalciferol (VITAMIN D3) 3000 units TABS Take 3,000 Units by mouth daily.     clobetasol (TEMOVATE) 0.05 % external solution APPLY SOLUTION TOPICALLY TO AFFECTED AREA ON SCALP 1 TO 2 TIMES DAILY AS NEEDED 150 mL 0   desonide (DESOWEN) 0.05 % lotion Apply 1 application topically daily as needed (itching).     docusate sodium (COLACE) 100 MG capsule Take 100 mg by mouth 2 (two) times daily.     doxycycline (VIBRAMYCIN) 100 MG capsule Take 100 mg by mouth 2 (two) times daily.     ELIQUIS 5 MG TABS tablet TAKE 1 TABLET BY MOUTH TWICE A DAY 180 tablet 1   erythromycin ophthalmic ointment Place 1 application into both eyes at bedtime.  1   fluticasone (CUTIVATE) 0.05 % cream Apply 1 Application topically daily.     ipratropium-albuterol (DUONEB) 0.5-2.5 (3) MG/3ML SOLN Take 3 mLs by nebulization every 6 (six) hours as needed. 360 mL 3   ketorolac (ACULAR) 0.5 % ophthalmic solution Place 1 drop into both eyes 4 (four) times daily as needed (irritation).     magnesium oxide (MAG-OX) 400 (240 Mg) MG tablet TAKE 1 TABLET BY MOUTH TWICE A DAY 180 tablet 2   metoprolol succinate (TOPROL-XL) 25 MG 24 hr tablet Take 1 tablet (25 mg total) by mouth daily. 90 tablet 3   mometasone (NASONEX) 50 MCG/ACT nasal spray Place 2 sprays into the nose daily. (Patient taking differently: Place 2 sprays into the nose daily as needed (congestion).) 51 g 2   NARCAN 4 MG/0.1ML  LIQD nasal spray kit 1 spray once as needed (overdose).  0   nystatin ointment (MYCOSTATIN) Apply 1 application topically 2 (two) times daily. (Patient taking differently: Apply 1 application  topically daily as needed (skinn irritation).) 30 g 1   oxyCODONE (OXY IR/ROXICODONE) 5 MG immediate release tablet Take 5 mg by mouth 3 (three) times daily as needed for severe pain. for pain  0   polyethylene glycol (MIRALAX / GLYCOLAX) 17 g packet Take by mouth.     potassium chloride (KLOR-CON) 10 MEQ tablet Take 2 tablets in the AM and one in the PM. 30 tablet 0   prednisoLONE acetate (PRED FORTE) 1 % ophthalmic suspension Place 1 drop into the left eye at bedtime.     pregabalin (LYRICA) 50 MG capsule Take 50 mg by mouth 2 (two) times daily.     sodium chloride HYPERTONIC 3 % nebulizer solution Take by nebulization as  needed for cough. 240 mL 0   spironolactone (ALDACTONE) 25 MG tablet Take 12.5 mg by mouth daily.     tamsulosin (FLOMAX) 0.4 MG CAPS capsule Take 1 capsule by mouth at bedtime 80 capsule 3   temazepam (RESTORIL) 15 MG capsule TAKE 1 CAPSULE BY MOUTH AT BEDTIME AS NEEDED FOR SLEEP 90 capsule 0   torsemide (DEMADEX) 20 MG tablet Take 2 tablets by mouth 2 (two) times daily.     TRIAMCINOLONE PO Apply 1 application topically daily as needed (itching).     zinc gluconate 50 MG tablet Take 50 mg by mouth daily.     No facility-administered medications prior to visit.    Review of Systems  Review of Systems  Constitutional:  Positive for fatigue.  HENT:  Negative for congestion.   Respiratory:  Positive for shortness of breath. Negative for cough and wheezing.   Cardiovascular:  Positive for leg swelling.    Physical Exam  BP 120/80 (BP Location: Left Arm)   Pulse 79   Ht 5' 10"$  (1.778 m)   Wt 260 lb (117.9 kg)   SpO2 98% Comment: On 4L of oxygen  BMI 37.31 kg/m  Physical Exam Constitutional:      General: He is not in acute distress.    Appearance: Normal appearance. He is  obese. He is not ill-appearing.  HENT:     Head: Normocephalic and atraumatic.  Cardiovascular:     Rate and Rhythm: Normal rate. Rhythm irregular.     Comments: +2BLE edema Pulmonary:     Effort: Pulmonary effort is normal. No respiratory distress.     Breath sounds: Rales present. No rhonchi.  Musculoskeletal:        General: Normal range of motion.     Cervical back: Normal range of motion and neck supple.  Skin:    General: Skin is warm and dry.  Neurological:     General: No focal deficit present.     Mental Status: He is alert and oriented to person, place, and time. Mental status is at baseline.  Psychiatric:        Mood and Affect: Mood normal.        Behavior: Behavior normal.        Thought Content: Thought content normal.        Judgment: Judgment normal.      Lab Results:  CBC    Component Value Date/Time   WBC 6.4 09/16/2022 0000   WBC 8.7 08/09/2022 1456   RBC 3.76 (A) 09/16/2022 0000   HGB 13.5 09/16/2022 0000   HCT 40 (A) 09/16/2022 0000   PLT 224 09/16/2022 0000   MCV 104.4 (H) 08/09/2022 1456   MCH 34.0 11/17/2021 0440   MCHC 33.5 08/09/2022 1456   RDW 13.8 08/09/2022 1456   LYMPHSABS 1.8 08/09/2022 1456   MONOABS 1.1 (H) 08/09/2022 1456   EOSABS 0.1 08/09/2022 1456   BASOSABS 0.1 08/09/2022 1456    BMET    Component Value Date/Time   NA 140 10/13/2022 1048   NA 141 09/16/2022 0000   K 4.3 10/13/2022 1048   CL 94 (L) 10/13/2022 1048   CO2 41 (H) 10/13/2022 1048   GLUCOSE 100 (H) 10/13/2022 1048   BUN 19 10/13/2022 1048   BUN 23 (A) 09/16/2022 0000   CREATININE 1.06 10/13/2022 1048   CREATININE 0.90 07/22/2020 1201   CALCIUM 9.6 10/13/2022 1048   GFRNONAA >60 11/19/2021 1350   GFRAA 92 05/22/2020 1142    BNP  Component Value Date/Time   BNP 120.9 (H) 11/12/2021 1931   BNP 67 12/13/2019 1512    ProBNP    Component Value Date/Time   PROBNP 646 (H) 10/13/2022 1048    Imaging: DG Chest 2 View  Result Date:  12/26/2022 CLINICAL DATA:  Pneumonia EXAM: CHEST - 2 VIEW COMPARISON:  Chest CT and radiograph 11/16/2021 FINDINGS: Stable enlarged cardiac silhouette. There is prominent pleural calcifications again noted. Blunting of the LEFT costophrenic angle. Bibasilar atelectasis. No significant change from comparison exam. IMPRESSION: 1. Bilateral pleural calcifications and basilar atelectasis. 2. No significant change comparison exam. Difficult to exclude superimposed airspace disease on the chronic lung findings. Electronically Signed   By: Suzy Bouchard M.D.   On: 12/26/2022 15:31     Assessment & Plan:   Acute bronchitis - Improved; Cough is better - Continue mucolytic's and bronchodilators  ILD (interstitial lung disease) (HCC) - Increased dyspnea symptoms last several weeks along with general physical decline. Unclear if this is related to his underlying lung disease or other co-morbidities. Recommend getting updated chest imaging and pulmonary function testing to get a netter sense of this. FU virtual visit after testing is completed to review results.  Chronic respiratory failure with hypoxia (HCC) - Stable; Baseline oxygen requirements are between 3-5L  OSA treated with BiPAP - Patient is 100% compliant with BIPAP use last 30 days - No changes, encourage continued use   Martyn Ehrich, NP 01/18/2023

## 2023-01-18 NOTE — Patient Instructions (Addendum)
Recommend getting updated CT chest and breathing test to assess progression of your lung disease  No medication changes today   Continue to wear BIPAP nightly and with naps  Continue supplemental oxygen to maintain O2 >88-90%   Orders: HRCT  Spirometry with DLCO  Follow-up: Schedule virtual follow-up with me to go over results after you have had testing

## 2023-01-23 ENCOUNTER — Ambulatory Visit (HOSPITAL_BASED_OUTPATIENT_CLINIC_OR_DEPARTMENT_OTHER): Payer: Medicare Other

## 2023-01-26 DIAGNOSIS — J449 Chronic obstructive pulmonary disease, unspecified: Secondary | ICD-10-CM | POA: Diagnosis not present

## 2023-02-07 ENCOUNTER — Ambulatory Visit (INDEPENDENT_AMBULATORY_CARE_PROVIDER_SITE_OTHER): Payer: Medicare Other | Admitting: Pulmonary Disease

## 2023-02-07 DIAGNOSIS — J849 Interstitial pulmonary disease, unspecified: Secondary | ICD-10-CM

## 2023-02-07 DIAGNOSIS — Z79899 Other long term (current) drug therapy: Secondary | ICD-10-CM | POA: Diagnosis not present

## 2023-02-07 DIAGNOSIS — Z515 Encounter for palliative care: Secondary | ICD-10-CM | POA: Diagnosis not present

## 2023-02-07 DIAGNOSIS — M539 Dorsopathy, unspecified: Secondary | ICD-10-CM | POA: Diagnosis not present

## 2023-02-07 DIAGNOSIS — R030 Elevated blood-pressure reading, without diagnosis of hypertension: Secondary | ICD-10-CM | POA: Diagnosis not present

## 2023-02-07 DIAGNOSIS — G894 Chronic pain syndrome: Secondary | ICD-10-CM | POA: Diagnosis not present

## 2023-02-07 DIAGNOSIS — Z9181 History of falling: Secondary | ICD-10-CM | POA: Diagnosis not present

## 2023-02-07 LAB — PULMONARY FUNCTION TEST
DL/VA % pred: 135 %
DL/VA: 5.26 ml/min/mmHg/L
DLCO cor % pred: 53 %
DLCO cor: 12.74 ml/min/mmHg
DLCO unc % pred: 53 %
DLCO unc: 12.74 ml/min/mmHg
FEF 25-75 Pre: 1.62 L/sec
FEF2575-%Pred-Pre: 86 %
FEV1-%Pred-Pre: 46 %
FEV1-Pre: 1.28 L
FEV1FVC-%Pred-Pre: 124 %
FEV6-%Pred-Pre: 39 %
FEV6-Pre: 1.44 L
FEV6FVC-%Pred-Pre: 107 %
FVC-%Pred-Pre: 36 %
FVC-Pre: 1.44 L
Pre FEV1/FVC ratio: 89 %
Pre FEV6/FVC Ratio: 100 %

## 2023-02-07 NOTE — Patient Instructions (Signed)
Spirometry and DLCO Performed Today.  

## 2023-02-07 NOTE — Progress Notes (Signed)
Spirometry and DLCO Performed Today.  

## 2023-02-08 NOTE — Progress Notes (Signed)
Please let patient know PFTs showed severe restrictive lung disease and moderate diffusion defect. He is scheduled for HRCT imaging

## 2023-02-10 ENCOUNTER — Ambulatory Visit (HOSPITAL_BASED_OUTPATIENT_CLINIC_OR_DEPARTMENT_OTHER): Payer: Medicare Other

## 2023-02-12 ENCOUNTER — Ambulatory Visit (HOSPITAL_BASED_OUTPATIENT_CLINIC_OR_DEPARTMENT_OTHER): Payer: Medicare Other

## 2023-02-15 ENCOUNTER — Ambulatory Visit (HOSPITAL_BASED_OUTPATIENT_CLINIC_OR_DEPARTMENT_OTHER)
Admission: RE | Admit: 2023-02-15 | Discharge: 2023-02-15 | Disposition: A | Discharge status: Home / Self Care | Payer: Medicare Other | Source: Ambulatory Visit | Attending: Primary Care | Admitting: Primary Care

## 2023-02-15 DIAGNOSIS — J849 Interstitial pulmonary disease, unspecified: Secondary | ICD-10-CM | POA: Diagnosis not present

## 2023-02-15 DIAGNOSIS — R918 Other nonspecific abnormal finding of lung field: Secondary | ICD-10-CM | POA: Diagnosis not present

## 2023-02-20 ENCOUNTER — Telehealth: Payer: Self-pay | Admitting: Primary Care

## 2023-02-20 NOTE — Telephone Encounter (Signed)
Pft results were given to pt and spouse 3/7. HRCT has not yet been reviewed.   Beth, please advise on the results of pt's HRCT.

## 2023-02-20 NOTE — Telephone Encounter (Signed)
Pt wife wants a phone call to go over PFT and CT results. Unavail to do Mychart Video Visit or Office visit due to it being hard traveling with PT.

## 2023-02-21 NOTE — Progress Notes (Signed)
Patient has severe tracheobronchomalacia. Progression of fibrotic lung disease secondary to asbestos exposure, evidence of pulmonary HTN, mild cardiomegaly and coronary artery disease. Recommend he schedule a visit with Dr. [name] to discuss progression of his symptoms and findings in further detail

## 2023-02-21 NOTE — Telephone Encounter (Signed)
I sent a result message to you. He should see Dr. [name] for follow-up since his symptoms and ILD have progressed

## 2023-02-22 NOTE — Telephone Encounter (Signed)
Called and spoke with pt's spouse [name] letting her know that [facility] wanted pt to be seen by Dr. [name] for an appt due to his symptoms and to have HRCT further discussed.  When speaking with [name], she said that pt is getting ready to be accepted under hospice care. Due to this, they want to hold off on scheduling an appt with  Dr. [name] at this time.  I went over the results of the CT with [name] but she still wants to have a phone call from [facility] to further go over everything with her again.  Beth, please advise. Please reach out to [place].

## 2023-02-23 ENCOUNTER — Other Ambulatory Visit: Payer: Self-pay | Admitting: Physician Assistant

## 2023-02-23 NOTE — Telephone Encounter (Signed)
Pt requesting refill for Temazepam 15 mg capsule. Last OV 01/18/2023.

## 2023-02-24 DIAGNOSIS — J449 Chronic obstructive pulmonary disease, unspecified: Secondary | ICD-10-CM | POA: Diagnosis not present

## 2023-03-01 ENCOUNTER — Telehealth (INDEPENDENT_AMBULATORY_CARE_PROVIDER_SITE_OTHER): Admitting: Primary Care

## 2023-03-01 DIAGNOSIS — J9611 Chronic respiratory failure with hypoxia: Secondary | ICD-10-CM | POA: Diagnosis not present

## 2023-03-01 DIAGNOSIS — J398 Other specified diseases of upper respiratory tract: Secondary | ICD-10-CM | POA: Diagnosis not present

## 2023-03-01 DIAGNOSIS — J849 Interstitial pulmonary disease, unspecified: Secondary | ICD-10-CM | POA: Diagnosis not present

## 2023-03-01 DIAGNOSIS — I2729 Other secondary pulmonary hypertension: Secondary | ICD-10-CM | POA: Diagnosis not present

## 2023-03-01 NOTE — Addendum Note (Signed)
Addended by: Martyn Ehrich on: 03/01/2023 04:59 PM   Modules accepted: Level of Service

## 2023-03-01 NOTE — Telephone Encounter (Signed)
ATC LVM for patient's wife to contact our office back to schedule a visit with Beth .

## 2023-03-01 NOTE — Progress Notes (Signed)
Virtual Visit via Video Note  I connected with Maurice Reed on 03/01/23 at  4:00 PM EDT by a video enabled telemedicine application and verified that I am speaking with the correct person using two identifiers.  Location: Patient: Home Provider: Office    I discussed the limitations of evaluation and management by telemedicine and the availability of in person appointments. The patient expressed understanding and agreed to proceed.  History of Present Illness:  82 year old male, former smoker quit 1985 (75-pack-year history).  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 (on chronic anticoagulation), OSA on BiPAP, CHF, chronic pain.  Patient of Dr. [name].  Previous [facility] pulmonary encounter:  12/26/2022 Patient presents today for acute OV. He was seen on 12/22/21 at family medicine for acute cough. Flu and covid swabs were negative. He was treated for presumed pneumonia. He received 1 gram Rocephin injections and was started on Levaquin and prednisone 40mg  x 5 days.   Presents today for follow-up PNA. He continues to have a cough, which he feels is some better. Cough is congested but non-productive. He is having a hard time getting phelgm up. He feels tired. He is not on maintenance inhaler. Using albuterol inhaler 2-3 times a day. Needs refill ipratropium-albuterol nebulizer. He is using 4-6L of oxygen with exertion. He is compliant with BIPAP with 4L oxygen. Opthalmology said it was ok to be on prednisone for 2 weeks, if longer will need visit with him.    Acute bronchitis - Treated for suspected PNA on 12/22/22 with PCP. Currently on Levaquin 500mg  daily x 7 days and Prednisone 40mg  x 5 days. He has seen little improvement. Continues to have np congested cough. He has rhonchi with associated wheezing on exam, R>L. CXR today without acute infiltrate, stable chronic findings. Advised he take 1,200mg   twice daily until cough is better. Recommend he use ipratropium-albuterol nebulizer TID followed by flutter valve. Sending in RX hypertonic saline to use PRN for coug/congestion, extending Levaquin course additional 3 days and prednisone additional 5 days. FU in 1 week or sooner if needed.   01/18/2023 Patient presents today for 1 week follow-up. Treated for suspected PNA on 12/22/22 with PCP. During our last visit he was not feeling better, he had a persistent np cough with rhonchi and wheezing on exam. CXR without acute findings. We extended his antibiotics and prednisone and also started him on hypertonic saline nebulizer to loosen congestion. Today he is feeling a good deal better. His cough has improved significantly. Completed prednisone course. Breathing wise he has been more short winded last 4 weeks. He is still weak/tired. Wife feels overall his health has declined last several months, PCP referred him to hospice.   Airview download 12/19/22-01/17/23 Usage 30/30 days; 97% > 4 hours Average usage 11 hours 5 min Pressure 16/10cm h20 Airleaks 26L/min (95%) AHI 2.0  03/01/2023 Patient contacted today to go over result testing results.  He is currently under hospice care He has chronic dyspnea symptoms and cough He hurts all over, he is taking oxycodone 5mg  every 4 hours without much relief  He is using 4-6L oxygen, concentrator goes up to 10L He is wearing BIPAP nightly and with naps DME is [facility]  We reviewed HRCT imaging results and pulmonary function testing at length   Airview download 01/30/23- 02/28/23 Usage 30/30 days (100%) > 4 hours  Average usage 10 hours 29 mins Pressure IPAP 16/ EPAP 10cm Airleaks 16.7L/min  AHI 1.5     Observations/Objective:  Alert and oriented; Wearing oxygen. No visible respiratory distress   HRCT 02/15/23 >> extensive scattered coarsely calcified bilateral pleura plaques which have progressed since 2020, severe tracheobronchomalacia, spectrum of  findings compatible with fibrotic interstitial lung disease without frank honeycombing, evidence of pulmonary hypertension, mild cardiomegaly and three vessel coronary artery disease.  Pulmonary function testing PFTs 02/07/23>> FVC 1.44 (36%). FEV1 1.28 (46%). 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 Lung function and diffusion capacity has decreased 10% - Not candidate for anti-fibrotic's, continue supportive care  - Continue hospice care   Restrictive lung disease: - Secondary to obesity and pulmonary fibrosis / FEV1 1.28 (46%), DLCO 53% - Continue Symbicort 177mcg two puffs twice daily as long as there is clinical benefit  - Advised patient use ipratropium-albuterol every 8 hours   Chronic bronchitis: - Continue Robitussin 50ml every 4-6 hours for cough - Continue hypertonic saline nebulizer q12 hours to loosen congestion - Continue flutter valve three times daily to help facilitate airway clearance   Chronic respiratory failure: - Continue supplemental O2 4-6L, goal Spo2 >90%  - Continue hospice care   Tracheobronchomalacia:  - Not surgical candidate  - Continue BIPAP   Pulmonary hypertension: - Continue BIPAP - Continue bronchodilators - Continue supplemental O2    Follow Up Instructions:   3 months or sooner if needed  I discussed the assessment and treatment plan with the patient. The patient was provided an opportunity to ask questions and all were answered. The patient agreed with the plan and demonstrated an understanding of the instructions.   The patient was advised to call back or seek an in-person evaluation if the symptoms worsen or if the condition fails to improve as anticipated.  I provided 22 minutes of non-face-to-face time during this  encounter.   Martyn Ehrich, NP

## 2023-03-09 DIAGNOSIS — H40051 Ocular hypertension, right eye: Secondary | ICD-10-CM | POA: Diagnosis not present

## 2023-03-22 NOTE — Telephone Encounter (Signed)
Pt has been seen since calling the office. Closing encounter. 

## 2023-03-22 NOTE — Telephone Encounter (Signed)
Pt was scheduled for an appt. Closing encounter.

## 2023-03-26 ENCOUNTER — Other Ambulatory Visit: Payer: Self-pay | Admitting: Physician Assistant

## 2023-03-27 DIAGNOSIS — J449 Chronic obstructive pulmonary disease, unspecified: Secondary | ICD-10-CM | POA: Diagnosis not present

## 2023-04-04 DIAGNOSIS — G4733 Obstructive sleep apnea (adult) (pediatric): Secondary | ICD-10-CM | POA: Diagnosis not present

## 2023-04-11 DIAGNOSIS — L304 Erythema intertrigo: Secondary | ICD-10-CM | POA: Diagnosis not present

## 2023-04-11 DIAGNOSIS — L400 Psoriasis vulgaris: Secondary | ICD-10-CM | POA: Diagnosis not present

## 2023-04-11 DIAGNOSIS — M7130 Other bursal cyst, unspecified site: Secondary | ICD-10-CM | POA: Diagnosis not present

## 2023-04-11 DIAGNOSIS — L718 Other rosacea: Secondary | ICD-10-CM | POA: Diagnosis not present

## 2023-04-23 ENCOUNTER — Other Ambulatory Visit: Payer: Self-pay | Admitting: Physician Assistant

## 2023-04-24 DIAGNOSIS — H40051 Ocular hypertension, right eye: Secondary | ICD-10-CM | POA: Diagnosis not present

## 2023-04-26 DIAGNOSIS — J449 Chronic obstructive pulmonary disease, unspecified: Secondary | ICD-10-CM | POA: Diagnosis not present

## 2023-05-27 DIAGNOSIS — J449 Chronic obstructive pulmonary disease, unspecified: Secondary | ICD-10-CM | POA: Diagnosis not present

## 2023-06-05 DIAGNOSIS — H40051 Ocular hypertension, right eye: Secondary | ICD-10-CM | POA: Diagnosis not present

## 2023-06-15 ENCOUNTER — Ambulatory Visit: Payer: Medicare Other | Admitting: Adult Health

## 2023-07-18 ENCOUNTER — Encounter: Payer: Self-pay | Admitting: Physician Assistant

## 2023-07-18 DIAGNOSIS — K219 Gastro-esophageal reflux disease without esophagitis: Secondary | ICD-10-CM

## 2023-07-18 MED ORDER — MAGNESIUM OXIDE -MG SUPPLEMENT 400 (240 MG) MG PO TABS: 1.0000 | ORAL_TABLET | Freq: Two times a day (BID) | ORAL | 3 refills | Status: DC

## 2023-07-27 ENCOUNTER — Other Ambulatory Visit: Payer: Self-pay | Admitting: *Deleted

## 2023-07-27 DIAGNOSIS — B49 Unspecified mycosis: Secondary | ICD-10-CM

## 2023-10-13 DIAGNOSIS — L400 Psoriasis vulgaris: Secondary | ICD-10-CM | POA: Diagnosis not present

## 2023-10-13 DIAGNOSIS — M7130 Other bursal cyst, unspecified site: Secondary | ICD-10-CM | POA: Diagnosis not present

## 2023-10-13 DIAGNOSIS — L304 Erythema intertrigo: Secondary | ICD-10-CM | POA: Diagnosis not present

## 2023-10-13 DIAGNOSIS — L219 Seborrheic dermatitis, unspecified: Secondary | ICD-10-CM | POA: Diagnosis not present

## 2023-10-23 ENCOUNTER — Encounter: Payer: Self-pay | Admitting: Physician Assistant

## 2023-10-23 DIAGNOSIS — L600 Ingrowing nail: Secondary | ICD-10-CM

## 2023-11-22 ENCOUNTER — Telehealth: Payer: Medicare Other | Admitting: Physician Assistant

## 2023-11-24 ENCOUNTER — Telehealth (INDEPENDENT_AMBULATORY_CARE_PROVIDER_SITE_OTHER): Admitting: Physician Assistant

## 2023-11-24 ENCOUNTER — Encounter: Payer: Self-pay | Admitting: Physician Assistant

## 2023-11-24 VITALS — BP 117/59 | HR 91 | Ht 70.0 in | Wt 241.9 lb

## 2023-11-24 DIAGNOSIS — M109 Gout, unspecified: Secondary | ICD-10-CM

## 2023-11-24 DIAGNOSIS — I5032 Chronic diastolic (congestive) heart failure: Secondary | ICD-10-CM

## 2023-11-24 DIAGNOSIS — L718 Other rosacea: Secondary | ICD-10-CM | POA: Diagnosis not present

## 2023-11-24 DIAGNOSIS — I4821 Permanent atrial fibrillation: Secondary | ICD-10-CM | POA: Diagnosis not present

## 2023-11-24 DIAGNOSIS — Z515 Encounter for palliative care: Secondary | ICD-10-CM | POA: Diagnosis not present

## 2023-11-24 DIAGNOSIS — L409 Psoriasis, unspecified: Secondary | ICD-10-CM

## 2023-11-24 MED ORDER — TEMAZEPAM 30 MG PO CAPS: 30.0000 mg | ORAL_CAPSULE | Freq: Every day | ORAL | Status: AC

## 2023-11-24 NOTE — Patient Instructions (Signed)
It was great to see you!  Stop naproxen, magnesium oxide, doxycycline, colace, otezla  Continue temazepam 30 mg  Take care,  Jarold Motto PA-C

## 2023-11-24 NOTE — Progress Notes (Signed)
I acted as a Neurosurgeon for [employer], PA-C [name], LPN  Virtual Visit via Video Note   I, Jarold Motto, PA, connected with  Maurice Reed  ([id number], 29-May-1941) on 11/24/23 at  1:20 PM EST by a video-enabled telemedicine application and verified that I am speaking with the correct person using two identifiers.  Patient is also accompanied by his wife, Maurice Reed.  Location: Patient: Home Provider: [facility] [place] [facility] office   I discussed the limitations of evaluation and management by telemedicine and the availability of in person appointments. The patient expressed understanding and agreed to proceed.    History of Present Illness: Maurice Reed is a 82 y.o. who would like to review his medications today.  Pt wants to discuss stopping some of his medications.  He is currently followed by a nurse practitioner with hospice who visits him regularly for his care.  His wife is already discussed her desires to begin to deprescribed medications for patient.  She is wanting to review these medication changes with me.  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 He is planning to stop this as his symptoms are stable.  PAF He continues to see his cardiologist but has yet to reach out to them regarding need for possible de-prescribing of medications.  Wife reports that hospice nurse practitioner will plan to do this soon. For now patient is planning to continue his antihypertensives, his diuretics, and his Eliquis.  Gout He is currently prescribed naproxen 500 mg twice daily as well as prednisone for his current gout flare in his hand Symptoms seem stable  Psoriasis He is currently taking Otezla twice daily and is ready to discontinue this for deprescribing medications Symptoms are currently well-controlled He also has various creams and ointments from dermatology for breakthrough symptoms that he has not really had to use  Problems:   Patient Active Problem List   Diagnosis Date Noted   Acute bronchitis 12/26/2022   Syncope 11/16/2021   Hypokalemia 11/16/2021   (HFpEF) heart failure with preserved ejection fraction (HCC) 11/16/2021   Pneumonia due to COVID-19 virus 11/12/2021   Secondary hypercoagulable state (HCC) 06/08/2021   Hyperlipidemia 01/27/2021   Mild aortic stenosis 01/27/2021   Chronic anticoagulation 04/16/2020   Chronic constipation 03/03/2020   Bilateral lower abdominal discomfort 03/03/2020   Chronic respiratory failure with hypoxia (HCC) 12/23/2019   Shortness of breath 12/23/2019   Healthcare maintenance 08/16/2019   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   Sensorineural hearing loss (SNHL), 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 10/17/2018   Bilateral lower extremity edema 07/04/2018   Sclerosis of the skin 06/26/2018   ILD (interstitial lung disease) (HCC) 05/22/2018   Insomnia 05/22/2018   Erectile dysfunction 05/22/2018   Obesity    Kidney stones    Essential hypertension    GERD (gastroesophageal reflux disease)    Former smoker    Arthritis    Spondylosis without myelopathy or radiculopathy, 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   OSA treated with BiPAP 06/17/2013   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   Atrial fibrillation (HCC) 06/17/2013   Rosacea 06/17/2013   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   History of pulmonary embolus (PE) 12/05/1998    Allergies:  Allergies  Allergen Reactions   Other Itching    Cats: watery eyes, itching, sneezing   Medications:  Current Outpatient Medications:    Albuterol Sulfate (PROAIR RESPICLICK) 108 (90 Base)  MCG/ACT AEPB, Inhale 2 puffs into the lungs every 6 (six) hours as needed., Disp: , Rfl:    Atropine Sulfate-NaCl 0.01-0.9 % SOLN, Place 1 drop into the left eye at bedtime., Disp: , Rfl:    augmented betamethasone dipropionate (DIPROLENE-AF) 0.05 % cream,  Apply topically., Disp: , Rfl:    bimatoprost (LUMIGAN) 0.01 % SOLN, Place 1 drop into the right eye at bedtime., Disp: , Rfl:    budesonide-formoterol (SYMBICORT) 160-4.5 MCG/ACT inhaler, Inhale 2 puffs into the lungs 2 (two) times daily., Disp: , Rfl:    Cholecalciferol (VITAMIN D3) 3000 units TABS, Take 3,000 Units by mouth daily., Disp: , Rfl:    Ciclopirox 1 % shampoo, Apply 1 Application topically every other day., Disp: , Rfl:    clobetasol (TEMOVATE) 0.05 % external solution, APPLY SOLUTION TOPICALLY TO AFFECTED AREA ON SCALP 1 TO 2 TIMES DAILY AS NEEDED, Disp: 150 mL, Rfl: 0   desonide (DESOWEN) 0.05 % lotion, Apply 1 application topically daily as needed (itching)., Disp: , Rfl:    ELIQUIS 5 MG TABS tablet, TAKE 1 TABLET BY MOUTH TWICE A DAY, Disp: 180 tablet, Rfl: 1   erythromycin ophthalmic ointment, Place 1 application into both eyes at bedtime., Disp: , Rfl: 1   fluticasone (CUTIVATE) 0.05 % cream, Apply 1 Application topically daily., Disp: , Rfl:    gabapentin (NEURONTIN) 100 MG capsule, Take 200 mg by mouth 2 (two) times daily., Disp: , Rfl:    ipratropium-albuterol (DUONEB) 0.5-2.5 (3) MG/3ML SOLN, Take 3 mLs by nebulization every 6 (six) hours as needed., Disp: 360 mL, Rfl: 3   ketoconazole (NIZORAL) 2 % cream, Apply 1 Application topically daily as needed., Disp: , Rfl:    ketorolac (ACULAR) 0.5 % ophthalmic solution, Place 1 drop into both eyes 4 (four) times daily as needed (irritation)., Disp: , Rfl:    metoprolol succinate (TOPROL-XL) 25 MG 24 hr tablet, Take 1 tablet by mouth once daily, Disp: 90 tablet, Rfl: 1   mometasone (NASONEX) 50 MCG/ACT nasal spray, Place 2 sprays into the nose daily. (Patient taking differently: Place 2 sprays into the nose daily as needed (congestion).), Disp: 51 g, Rfl: 2   Morphine Sulfate (MORPHINE CONCENTRATE) 10 mg / 0.5 ml concentrated solution, Take 10 mg by mouth every 2 (two) hours., Disp: , Rfl:    NARCAN 4 MG/0.1ML LIQD nasal spray kit,  1 spray once as needed (overdose)., Disp: , Rfl: 0   nystatin ointment (MYCOSTATIN), Apply 1 application topically 2 (two) times daily. (Patient taking differently: Apply 1 application  topically daily as needed (skinn irritation).), Disp: 30 g, Rfl: 1   polyethylene glycol (MIRALAX / GLYCOLAX) 17 g packet, Take by mouth., Disp: , Rfl:    potassium chloride (KLOR-CON) 10 MEQ tablet, Take 2 tablets in the AM and one in the PM., Disp: 30 tablet, Rfl: 0   prednisoLONE acetate (PRED FORTE) 1 % ophthalmic suspension, Place 1 drop into the left eye at bedtime., Disp: , Rfl:    sodium chloride HYPERTONIC 3 % nebulizer solution, Take by nebulization as needed for cough., Disp: 240 mL, Rfl: 0   spironolactone (ALDACTONE) 25 MG tablet, Take 12.5 mg by mouth daily., Disp: , Rfl:    tamsulosin (FLOMAX) 0.4 MG CAPS capsule, Take 1 capsule by mouth at bedtime, Disp: 80 capsule, Rfl: 3   torsemide (DEMADEX) 20 MG tablet, Take 2 tablets by mouth 2 (two) times daily., Disp: , Rfl:    TRIAMCINOLONE PO, Apply 1 application topically daily  as needed (itching)., Disp: , Rfl:    temazepam (RESTORIL) 30 MG capsule, Take 1 capsule (30 mg total) by mouth at bedtime., Disp: , Rfl:    zinc gluconate 50 MG tablet, Take 50 mg by mouth daily. (Patient not taking: Reported on 11/24/2023), Disp: , Rfl:   Observations/Objective: Patient is well-developed, well-nourished in no acute distress.  Resting comfortably  at home.  Head is normocephalic, atraumatic.  No labored breathing.  Speech is clear and coherent with logical content.  Patient is alert and oriented at baseline.   Assessment and Plan: 1. Hospice care (Primary) Patient will continue to meet with palliative/hospice nurse practitioner, who is also coordinating care with his cardiology team  2. Ocular rosacea Discontinue doxycycline at this time as symptoms are stable and we are trying to de-prescribe medications  3. Acute gout of hand, unspecified cause,  unspecified laterality Because patient is planning to continue to Eliquis, patient discontinued naproxen immediately Continue prednisone burst and then discontinue medication  4. Permanent atrial fibrillation (HCC); 5. Chronic heart failure with preserved ejection fraction [facility]) He is planning to continue his Eliquis, beta-blocker, diuretics until his hospice and palliative provider has communicated with his cardiologist  6. Psoriasis Will discontinue Otezla at this time as symptoms are overall stable and he has as needed  Overall we discussed risks and benefits of continuing these medications versus stopping medications He is going to continue to follow-up closely with nurse practitioner of palliative care/hospice Continue to follow-up with [place] as needed  Follow Up Instructions: I discussed the assessment and treatment plan with the patient. The patient was provided an opportunity to ask questions and all were answered. The patient agreed with the plan and demonstrated an understanding of the instructions.  A copy of instructions were sent to the patient via MyChart unless otherwise noted below.   The patient was advised to call back or seek an in-person evaluation if the symptoms worsen or if the condition fails to improve as anticipated.  I spent a total of 42 minutes on this visit, today 11/24/23, which included reviewing previous notes from cardiology, discussing plan of care with patient and using shared-decision making on next steps, refilling medications, and documenting the findings in the note.   Jarold Motto, [name]

## 2024-02-02 ENCOUNTER — Encounter: Payer: Self-pay | Admitting: Physician Assistant

## 2024-02-02 ENCOUNTER — Telehealth (INDEPENDENT_AMBULATORY_CARE_PROVIDER_SITE_OTHER): Admitting: Physician Assistant

## 2024-02-02 DIAGNOSIS — R52 Pain, unspecified: Secondary | ICD-10-CM

## 2024-02-02 DIAGNOSIS — M79645 Pain in left finger(s): Secondary | ICD-10-CM

## 2024-02-02 MED ORDER — PREDNISONE 20 MG PO TABS: 20.0000 mg | ORAL_TABLET | Freq: Two times a day (BID) | ORAL | 0 refills | Status: AC

## 2024-02-02 NOTE — Progress Notes (Signed)
 Virtual Visit via Video Note   I, Jarold Motto, connected with  Maurice Reed  ([id number], 1941-02-17) on 02/02/24 at  1:40 PM EST by a video-enabled telemedicine application and verified that I am speaking with the correct person using two identifiers.  Location: Patient: Home Provider: [facility] [place] office   I discussed the limitations of evaluation and management by telemedicine and the availability of in person appointments. The patient expressed understanding and agreed to proceed.    History of Present Illness: Maurice Reed is a 83 y.o. male who is following up on:  Pain management He continues on hospice care He is taking liquid morphine every 2-3 hours for his pain His wife states that his prescriber is trying to change him to methadone She is concerned that this could worsen his respiratory depression  Finger pain His index finger is red, swollen and tender He has history of this in another finger, that was presumably gout He had prior antibiotic(s) but the only effective treatment(s) in the past was naproxen - which he is taking Pain is intense Difficulty bending at times Denies fevers, chills, area of purulence  Problems:  Patient Active Problem List   Diagnosis Date Noted   Acute bronchitis 12/26/2022   Syncope 11/16/2021   Hypokalemia 11/16/2021   (HFpEF) heart failure with preserved ejection fraction (HCC) 11/16/2021   Pneumonia due to COVID-19 virus 11/12/2021   Secondary hypercoagulable state (HCC) 06/08/2021   Hyperlipidemia 01/27/2021   Mild aortic stenosis 01/27/2021   Chronic anticoagulation 04/16/2020   Chronic constipation 03/03/2020   Bilateral lower abdominal discomfort 03/03/2020   Chronic respiratory failure with hypoxia (HCC) 12/23/2019   Shortness of breath 12/23/2019   Healthcare maintenance 08/16/2019   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   Sensorineural hearing loss (SNHL), 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   Pleural  plaque without asbestos 10/17/2018   Bilateral lower extremity edema 07/04/2018   Sclerosis of the skin 06/26/2018   ILD (interstitial lung disease) (HCC) 05/22/2018   Insomnia 05/22/2018   Erectile dysfunction 05/22/2018   Obesity    Kidney stones    Essential hypertension    GERD (gastroesophageal reflux disease)    Former smoker    Arthritis    Spondylosis without myelopathy or radiculopathy, 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   OSA treated with BiPAP 06/17/2013   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   Atrial fibrillation (HCC) 06/17/2013   Rosacea 06/17/2013   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   History of pulmonary embolus (PE) 12/05/1998    Allergies:  Allergies  Allergen Reactions   Other Itching    Cats: watery eyes, itching, sneezing   Medications:  Current Outpatient Medications:    predniSONE (DELTASONE) 20 MG tablet, Take 1 tablet (20 mg total) by mouth 2 (two) times daily with a meal., Disp: 10 tablet, Rfl: 0   Albuterol Sulfate (PROAIR RESPICLICK) 108 (90 Base) MCG/ACT AEPB, Inhale 2 puffs into the lungs every 6 (six) hours as needed., Disp: , Rfl:    Atropine Sulfate-NaCl 0.01-0.9 % SOLN, Place 1 drop into the left eye at bedtime., Disp: , Rfl:    augmented betamethasone dipropionate (DIPROLENE-AF) 0.05 % cream, Apply topically., Disp: , Rfl:    bimatoprost (LUMIGAN) 0.01 % SOLN, Place 1 drop into the right eye at bedtime., Disp: , Rfl:    budesonide-formoterol (SYMBICORT) 160-4.5 MCG/ACT inhaler, Inhale 2 puffs into the lungs 2 (two) times daily., Disp: , Rfl:  Cholecalciferol (VITAMIN D3) 3000 units TABS, Take 3,000 Units by mouth daily., Disp: , Rfl:    Ciclopirox 1 % shampoo, Apply 1 Application topically every other day., Disp: , Rfl:    clobetasol (TEMOVATE) 0.05 % external solution, APPLY SOLUTION TOPICALLY TO AFFECTED AREA ON SCALP 1 TO 2 TIMES DAILY AS NEEDED, Disp: 150 mL, Rfl: 0   desonide (DESOWEN) 0.05  % lotion, Apply 1 application topically daily as needed (itching)., Disp: , Rfl:    ELIQUIS 5 MG TABS tablet, TAKE 1 TABLET BY MOUTH TWICE A DAY, Disp: 180 tablet, Rfl: 1   erythromycin ophthalmic ointment, Place 1 application into both eyes at bedtime., Disp: , Rfl: 1   fluticasone (CUTIVATE) 0.05 % cream, Apply 1 Application topically daily., Disp: , Rfl:    gabapentin (NEURONTIN) 100 MG capsule, Take 200 mg by mouth 2 (two) times daily., Disp: , Rfl:    ipratropium-albuterol (DUONEB) 0.5-2.5 (3) MG/3ML SOLN, Take 3 mLs by nebulization every 6 (six) hours as needed., Disp: 360 mL, Rfl: 3   ketoconazole (NIZORAL) 2 % cream, Apply 1 Application topically daily as needed., Disp: , Rfl:    ketorolac (ACULAR) 0.5 % ophthalmic solution, Place 1 drop into both eyes 4 (four) times daily as needed (irritation)., Disp: , Rfl:    metoprolol succinate (TOPROL-XL) 25 MG 24 hr tablet, Take 1 tablet by mouth once daily, Disp: 90 tablet, Rfl: 1   mometasone (NASONEX) 50 MCG/ACT nasal spray, Place 2 sprays into the nose daily. (Patient taking differently: Place 2 sprays into the nose daily as needed (congestion).), Disp: 51 g, Rfl: 2   Morphine Sulfate (MORPHINE CONCENTRATE) 10 mg / 0.5 ml concentrated solution, Take 10 mg by mouth every 2 (two) hours., Disp: , Rfl:    NARCAN 4 MG/0.1ML LIQD nasal spray kit, 1 spray once as needed (overdose)., Disp: , Rfl: 0   nystatin ointment (MYCOSTATIN), Apply 1 application topically 2 (two) times daily. (Patient taking differently: Apply 1 application  topically daily as needed (skinn irritation).), Disp: 30 g, Rfl: 1   polyethylene glycol (MIRALAX / GLYCOLAX) 17 g packet, Take by mouth., Disp: , Rfl:    potassium chloride (KLOR-CON) 10 MEQ tablet, Take 2 tablets in the AM and one in the PM., Disp: 30 tablet, Rfl: 0   prednisoLONE acetate (PRED FORTE) 1 % ophthalmic suspension, Place 1 drop into the left eye at bedtime., Disp: , Rfl:    sodium chloride HYPERTONIC 3 % nebulizer  solution, Take by nebulization as needed for cough., Disp: 240 mL, Rfl: 0   spironolactone (ALDACTONE) 25 MG tablet, Take 12.5 mg by mouth daily., Disp: , Rfl:    tamsulosin (FLOMAX) 0.4 MG CAPS capsule, Take 1 capsule by mouth at bedtime, Disp: 80 capsule, Rfl: 3   temazepam (RESTORIL) 30 MG capsule, Take 1 capsule (30 mg total) by mouth at bedtime., Disp: , Rfl:    torsemide (DEMADEX) 20 MG tablet, Take 2 tablets by mouth 2 (two) times daily., Disp: , Rfl:    TRIAMCINOLONE PO, Apply 1 application topically daily as needed (itching)., Disp: , Rfl:    zinc gluconate 50 MG tablet, Take 50 mg by mouth daily. (Patient not taking: Reported on 11/24/2023), Disp: , Rfl:   Observations/Objective: Patient is well-developed, well-nourished in no acute distress.  Resting comfortably  at home.  Head is normocephalic, atraumatic.  No labored breathing.  Speech is clear and coherent with logical content.  Patient is alert and oriented at baseline.  Index finger  with erythematous and swollen distal interphalangeal\ with slight reduced range of motion   Assessment and Plan: 1. Pain management (Primary) Discussed with patient and wife I was honest with them that I am not well versed in chronic pain management, including the use of methadone I deferred management to their current provider  2. Pain of finger of left hand Due to similar history in past with presumable gout -- will treat as such with prednisone per orders Patient is agreeable to plan, usually tolerates this well without side effects If symptom(s) worsen/persist - recommend Urgent Care evaluation  Follow Up Instructions: I discussed the assessment and treatment plan with the patient. The patient was provided an opportunity to ask questions and all were answered. The patient agreed with the plan and demonstrated an understanding of the instructions.  A copy of instructions were sent to the patient via MyChart unless otherwise noted below.    The patient was advised to call back or seek an in-person evaluation if the symptoms worsen or if the condition fails to improve as anticipated.  I spent a total of 26 minutes on this visit, today 02/02/24, which included reviewing previous notes, discussing plan of care with patient and using shared-decision making on next steps, refilling medications, and documenting the findings in the note.   Jarold Motto, [name]

## 2024-03-05 DEATH — deceased
